# Patient Record
Sex: Male | Born: 1955
Health system: Southern US, Community
[De-identification: ages and names within clinical notes are randomized; demographics above are authoritative.]

## PROBLEM LIST (undated history)

## (undated) DIAGNOSIS — E119 Type 2 diabetes mellitus without complications: Secondary | ICD-10-CM

## (undated) DIAGNOSIS — I10 Essential (primary) hypertension: Secondary | ICD-10-CM

## (undated) DIAGNOSIS — K219 Gastro-esophageal reflux disease without esophagitis: Secondary | ICD-10-CM

## (undated) DIAGNOSIS — M1990 Unspecified osteoarthritis, unspecified site: Secondary | ICD-10-CM

## (undated) DIAGNOSIS — I700 Atherosclerosis of aorta: Secondary | ICD-10-CM

## (undated) DIAGNOSIS — Z7902 Long term (current) use of antithrombotics/antiplatelets: Secondary | ICD-10-CM

## (undated) DIAGNOSIS — H47021 Hemorrhage in optic nerve sheath, right eye: Secondary | ICD-10-CM

## (undated) DIAGNOSIS — J302 Other seasonal allergic rhinitis: Secondary | ICD-10-CM

## (undated) DIAGNOSIS — N529 Male erectile dysfunction, unspecified: Secondary | ICD-10-CM

## (undated) DIAGNOSIS — E785 Hyperlipidemia, unspecified: Secondary | ICD-10-CM

## (undated) DIAGNOSIS — I2510 Atherosclerotic heart disease of native coronary artery without angina pectoris: Secondary | ICD-10-CM

## (undated) HISTORY — DX: Hemorrhage in optic nerve sheath, right eye: H47.021

## (undated) HISTORY — DX: Other seasonal allergic rhinitis: J30.2

## (undated) HISTORY — DX: Hyperlipidemia, unspecified: E78.5

## (undated) HISTORY — PX: VASECTOMY: SHX75

## (undated) HISTORY — DX: Type 2 diabetes mellitus without complications: E11.9

## (undated) HISTORY — DX: Male erectile dysfunction, unspecified: N52.9

## (undated) HISTORY — DX: Atherosclerotic heart disease of native coronary artery without angina pectoris: I25.10

---

## 2004-08-26 ENCOUNTER — Ambulatory Visit: Payer: Self-pay | Admitting: Family Medicine

## 2006-04-01 ENCOUNTER — Ambulatory Visit: Payer: Self-pay

## 2006-06-08 ENCOUNTER — Ambulatory Visit: Payer: Self-pay | Admitting: Orthopaedic Surgery

## 2006-06-09 ENCOUNTER — Ambulatory Visit: Payer: Self-pay | Admitting: Orthopaedic Surgery

## 2007-01-07 HISTORY — PX: ROTATOR CUFF REPAIR: SHX139

## 2007-01-28 ENCOUNTER — Ambulatory Visit: Payer: Self-pay | Admitting: Family Medicine

## 2008-06-14 IMAGING — RF DG ARTHROGRAM INJ W/FG MRI/CT SHOULDER*R*
1 series · 5 of 5 positions shown · non-contrast
Comparison: none

REASON FOR EXAM: right shoulder pain eval for RCT
COMMENTS:

[Series 1: run · 5 of 5 slices shown]
[im 1/5]
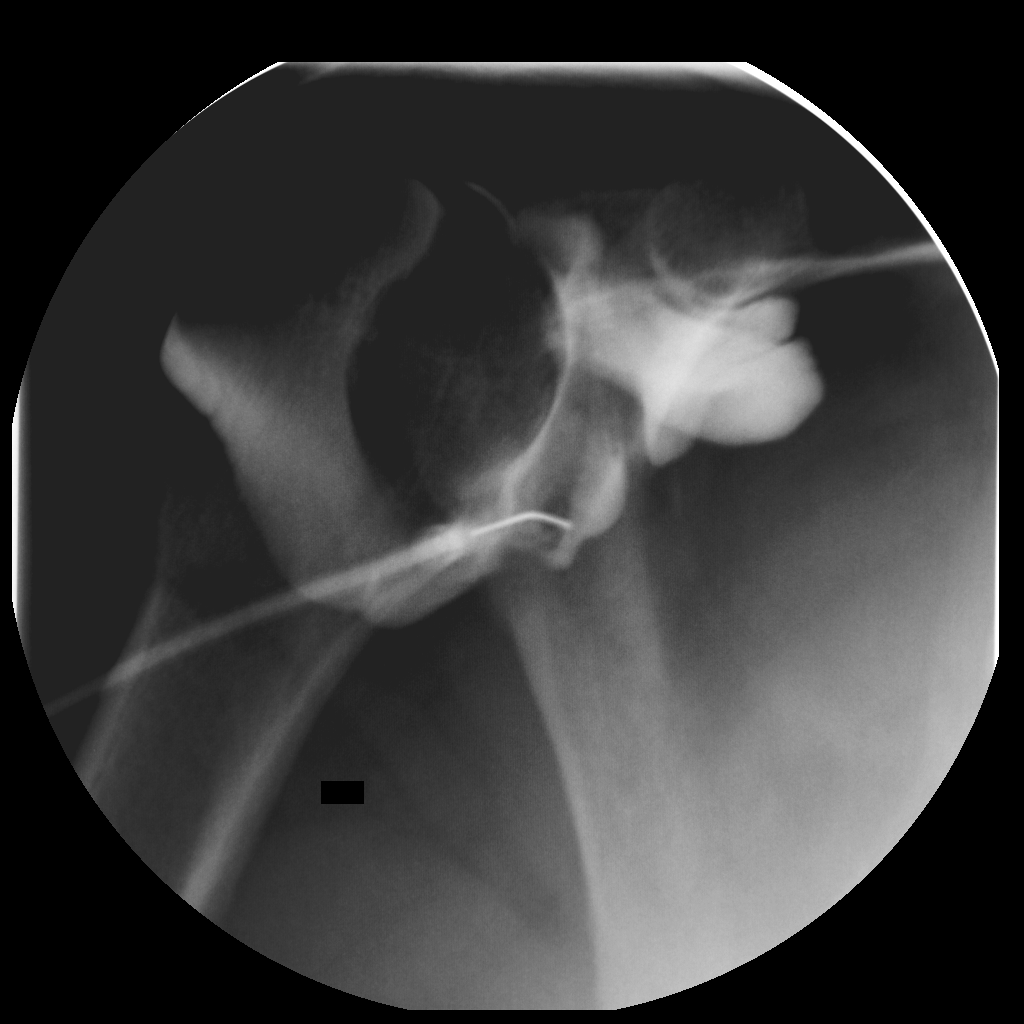
[im 2/5]
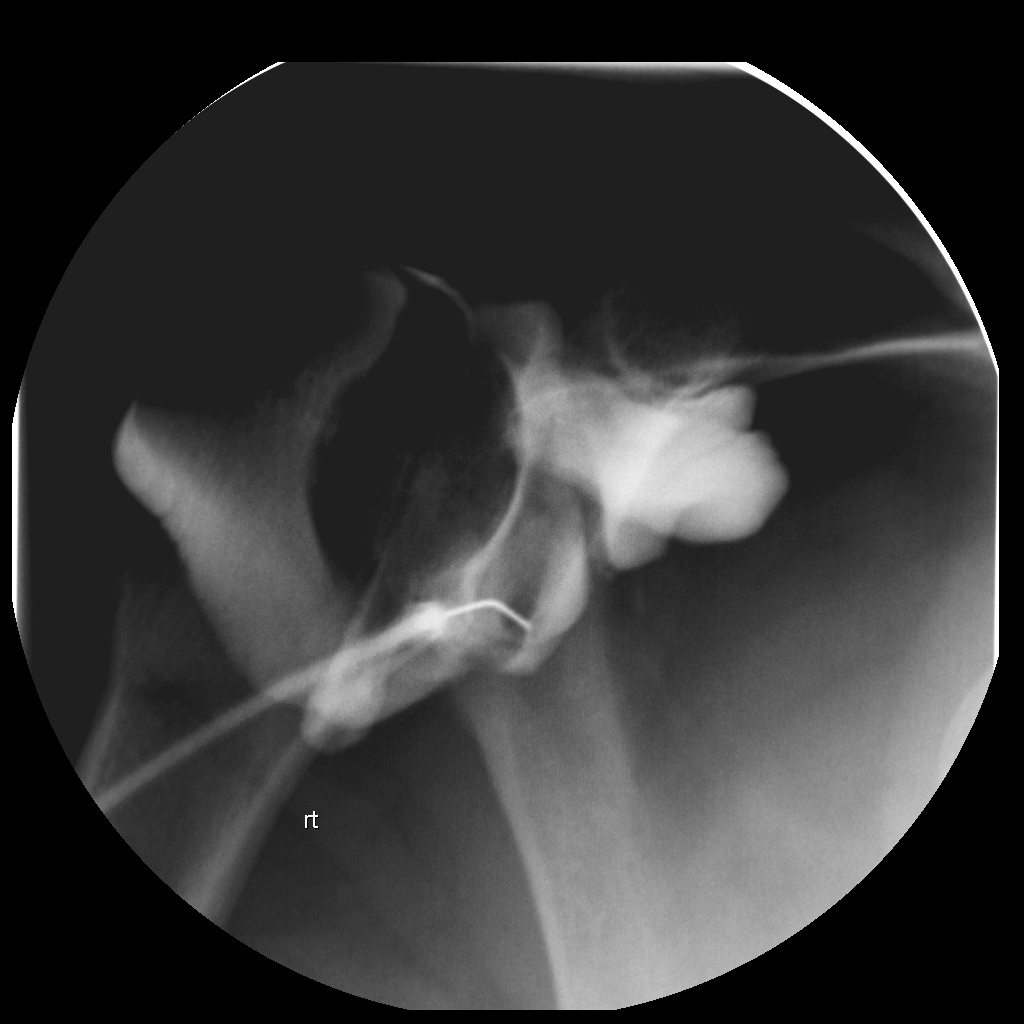
[im 3/5]
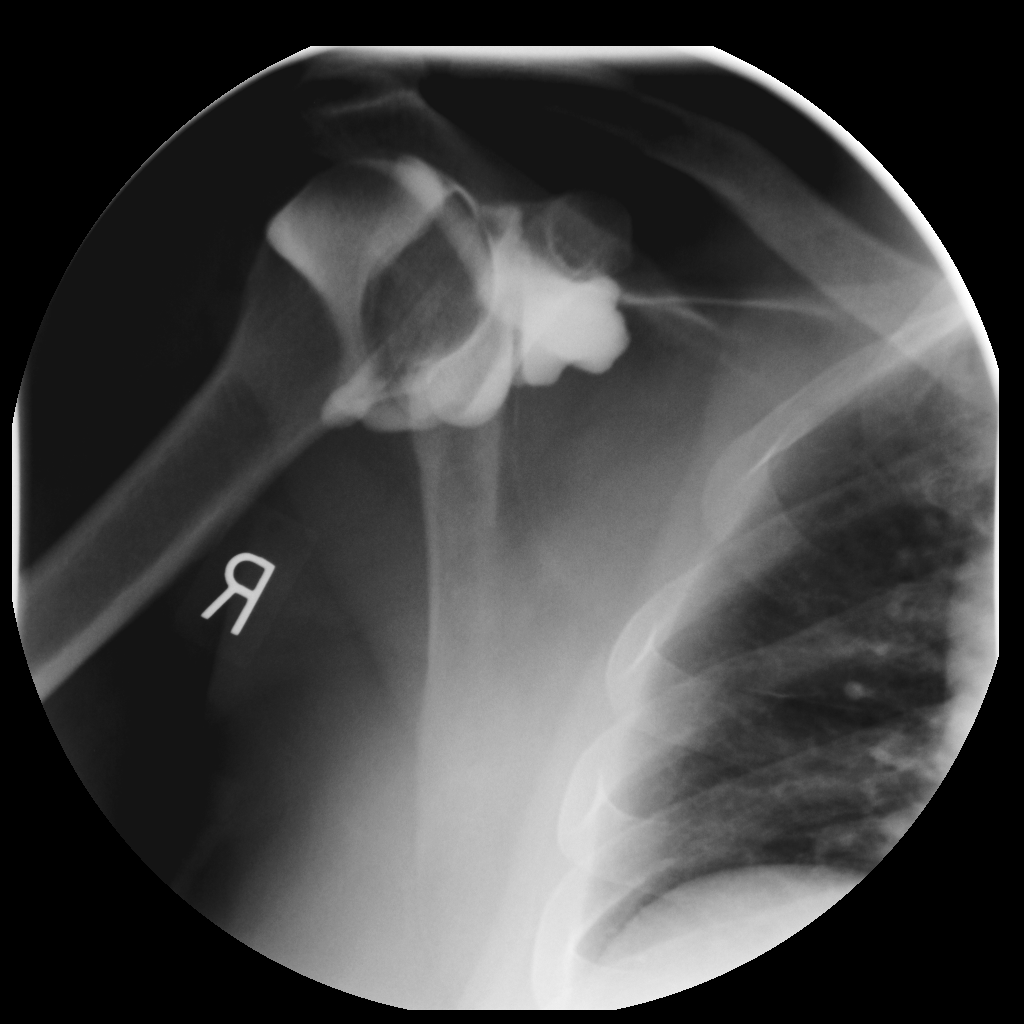
[im 4/5]
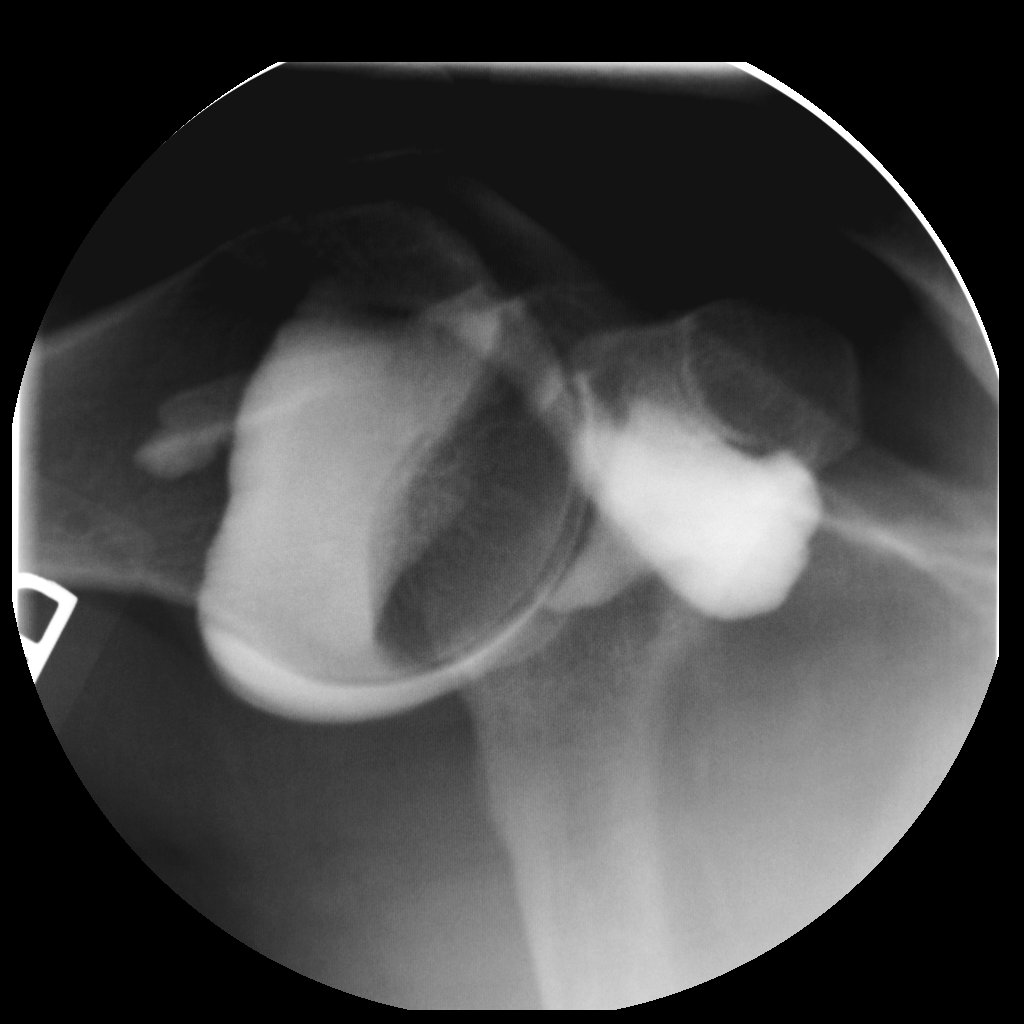
[im 5/5]
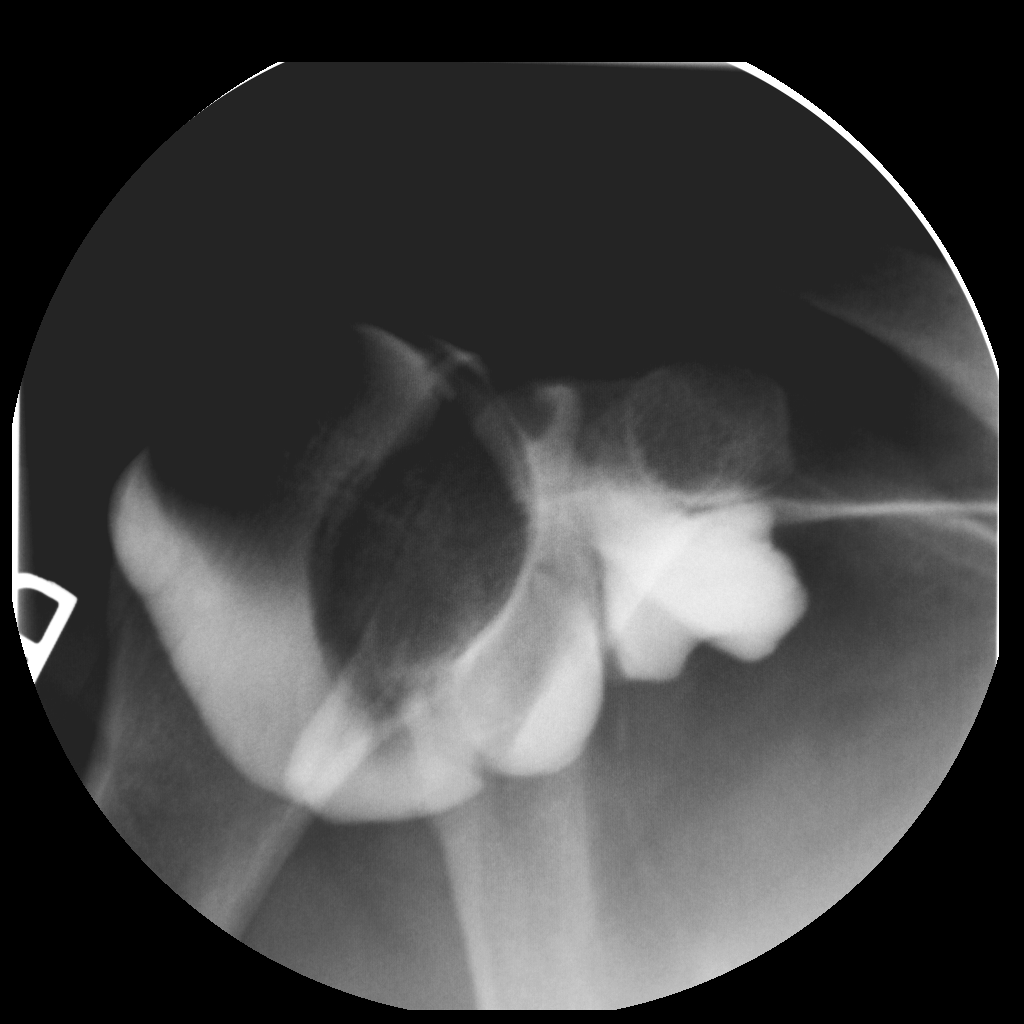

[5 of 5 positions shown; findings below may reference images not displayed]

PROCEDURE:     FL  - FL INJ RT SHOULDER  MR DELU  - March 25, 2006 [DATE]

RESULT:     The patient has history of a RIGHT shoulder injury and is being
evaluated for internal derangement. The anticipated procedure was discussed
with Mr. Morja. He voiced his willingness to proceed. The patient voiced no
allergy to lidocaine or iodine or Magnevist solutions. A time-out was
performed and the RIGHT site and correct procedure were confirmed.

The skin over the anterior aspect of the glenohumeral joint was cleansed
with an iodine solution. The subcutaneous tissues were infiltrated with
approximately 5 ml of 1% lidocaine. Subsequently, a 25- gauge spinal needle
was placed into the inferior aspect of the glenohumeral joint. Positioning
was confirmed and approximately 12 ml of a solution of 15 ml of sterile
saline, 10 ml of [id number], and 0.1 ml of Magnevist were instilled.
Filming was performed and the needle withdrawn. There was no immediate
evidence of a tear of the rotator cuff.
IMPRESSION: 1.The patient underwent successful fluoroscopically assisted injection of
the RIGHT glenohumeral joint. The patient went to the MRI Suite in good
condition. However, the patient had symptoms consistent with claustrophobia
and was unable to complete the MRI procedure.

## 2008-06-14 IMAGING — MR MR ARTHROGRAM SHOULDER*R*
1 series · 16 of 16 positions shown · non-contrast
Comparison: none

REASON FOR EXAM: right shoulder pain eval for RCT
COMMENTS:

PROCEDURE:     MR  - MR SHOULDER ARTHROGRAM R  - March 25, 2006  [DATE]
RESULT:
HISTORY: RIGHT shoulder pain.

[Series 3: T1 fat-sat · axial · 4.0mm · 0.62mm/px · z∈[-9,+112]mm · 16 of 16 slices shown]
[im 1/16]
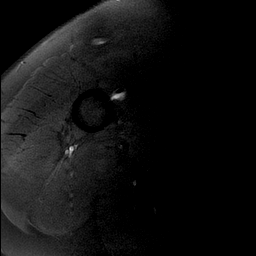
[im 2/16]
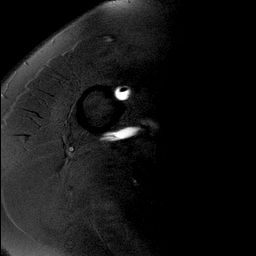
[im 3/16]
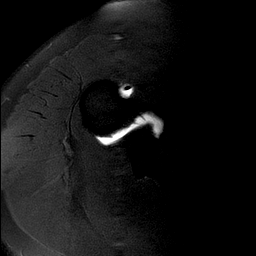
[im 4/16]
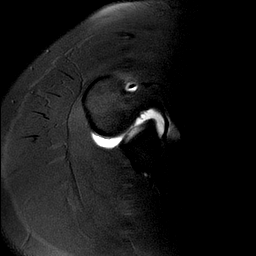
[im 5/16]
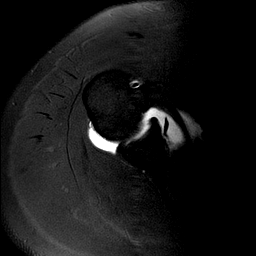
[im 6/16]
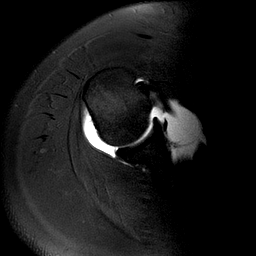
[im 7/16]
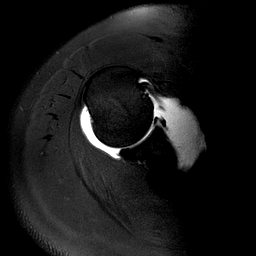
[im 8/16]
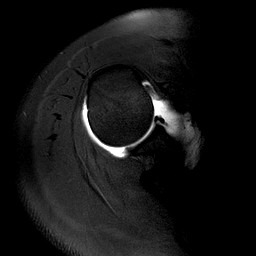
[im 9/16]
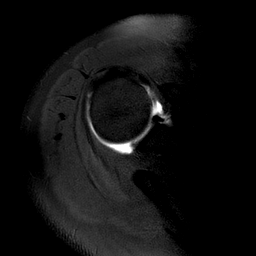
[im 10/16]
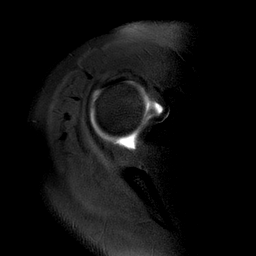
[im 11/16]
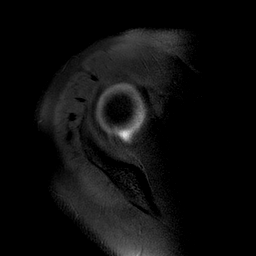
[im 12/16]
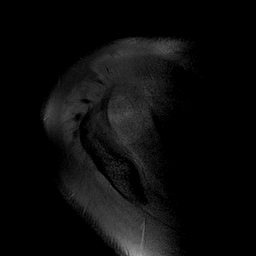
[im 13/16]
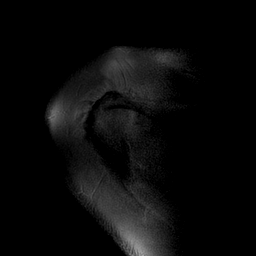
[im 14/16]
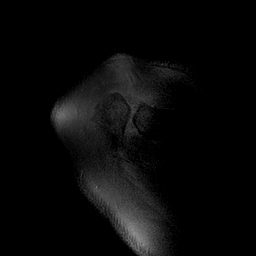
[im 15/16]
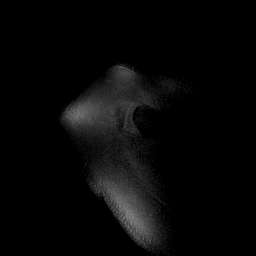
[im 16/16]
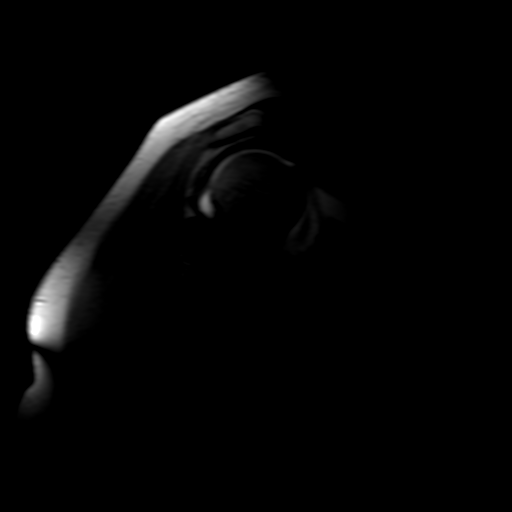

[16 of 16 positions shown; findings below may reference images not displayed]

COMPARISON STUDIES: No recent.

PROCEDURE AND FNIDINGS: MR shoulder arthrogram was performed following
arthrography by Dr. Flemming Hedegaard. This was a very limited exam as the patient
could not tolerate the MRI scan.  The rotator cuff muscles are difficult to
image as only axial views are available; however, no definite rotator cuff
muscle or tendon tear is noted on the axial images available.
Anterior/superior glenoid labral partial tear cannot be entirely excluded.
The long head of the biceps tendon is intact. The humeral head is intact.
IMPRESSION: 1)Limited exam as described above. Only one pulse sequence could be
obtained.  Subtle tear to the anterior superior glenoid labrum could not be
excluded.  The rotator cuff muscles and tendons were not adequately imaged.
No definite tear was identified.

## 2008-06-21 IMAGING — RF DG ARTHROGRAM INJ W/FG MRI/CT SHOULDER*R*
1 series · 2 of 2 positions shown · non-contrast
Comparison: none

REASON FOR EXAM: right shoulder pain eval for RCT
COMMENTS:

[Series 1: run · 2 of 2 slices shown]
[im 1/2]
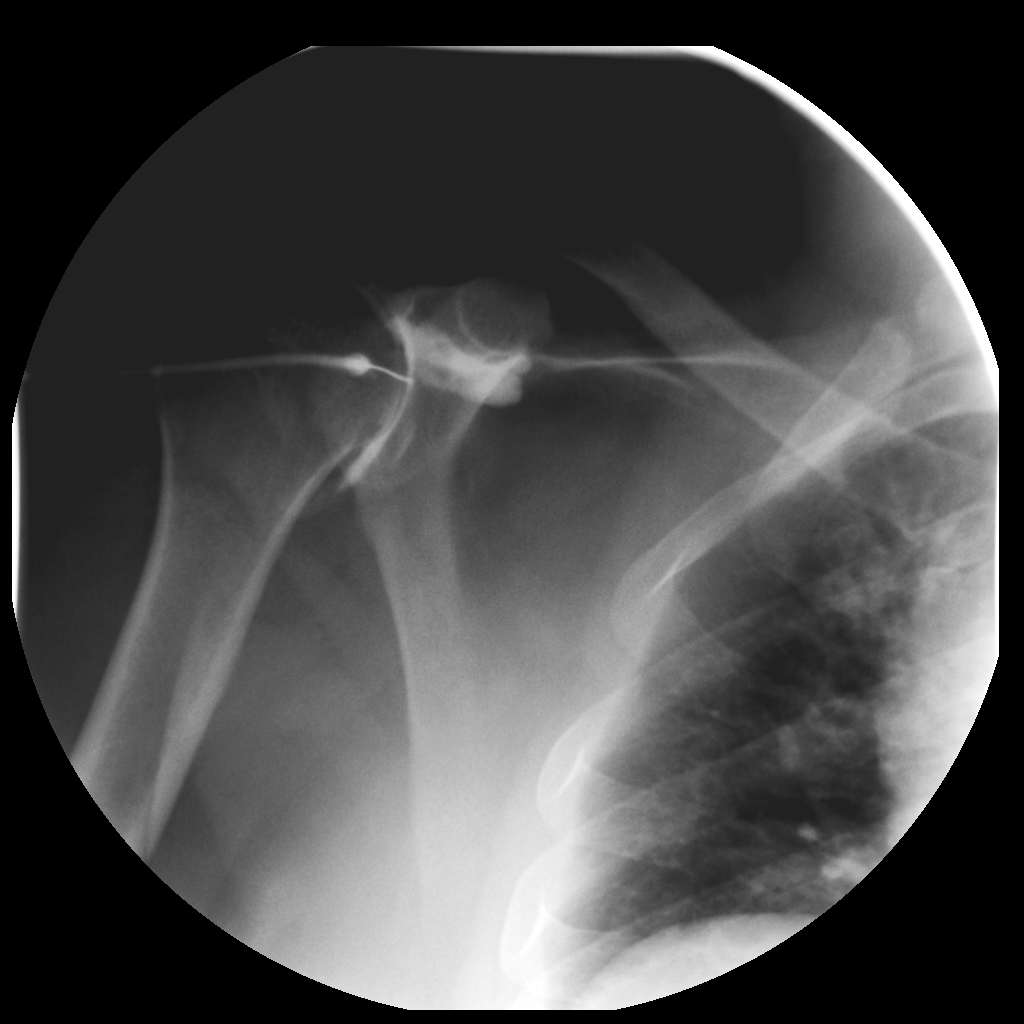
[im 2/2]
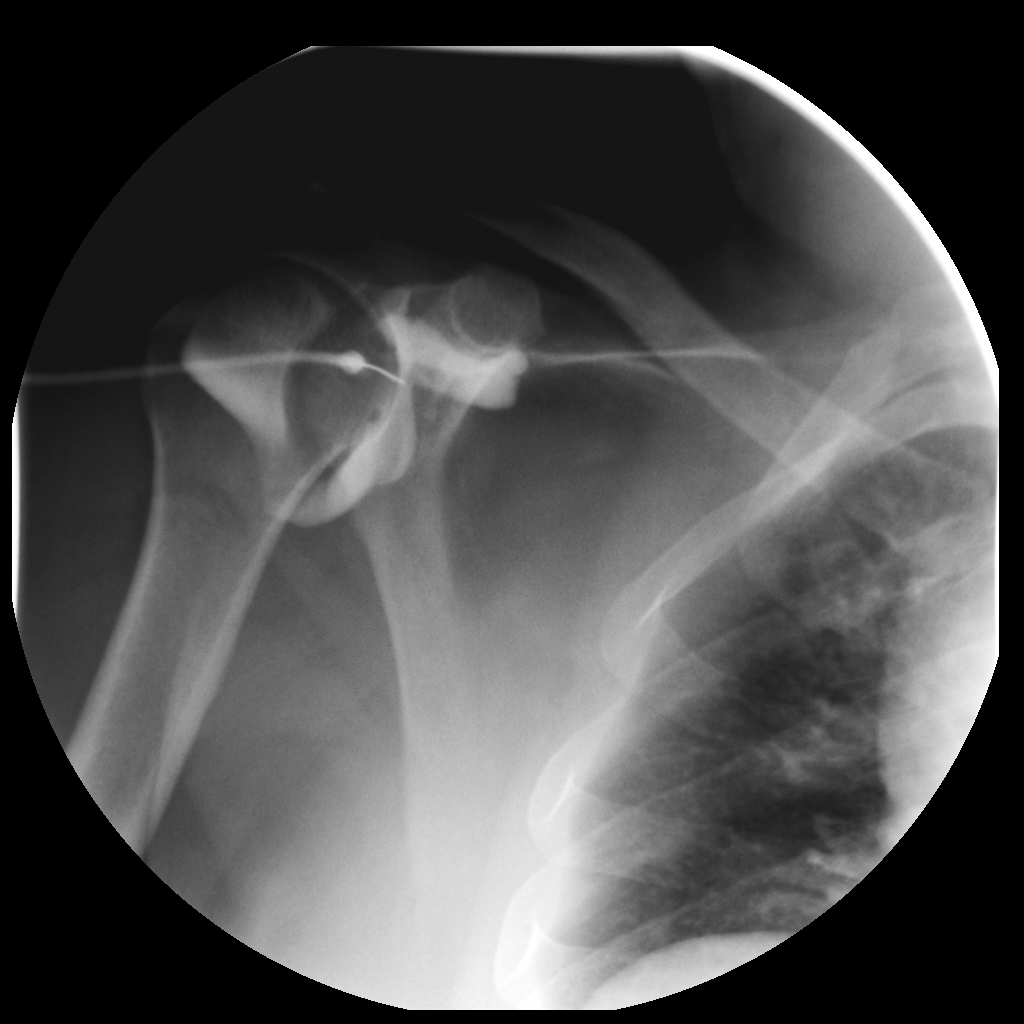

[2 of 2 positions shown; findings below may reference images not displayed]

PROCEDURE:     FL  - FL INJ RT SHOULDER  MR JACK  - April 01, 2006 [DATE]

RESULT:     The patient was informed of the risks and benefits of the
procedure and proper informed consent was obtained. The patient was brought
to the Fluoroscopy Suite and placed in a supine position. The RIGHT shoulder
was evaluated and a proper entry site for cannulation of the glenohumeral
joint was established. The overlying soft tissues were then prepped and
draped in the usual sterile fashion. Utilizing approximately 4 ml of 1%
lidocaine without epinephrine the overlying soft tissues were anesthetized.
The glenohumeral joint was then cannulated with a 21-gauge 3.5 inch spinal
needle.  The glenohumeral joint was injected with a mixture of Magnevist,
Optiray 320 and sterile saline.  Approximately 18 ml of this mixture was
injected into the joint.  The initial mixture consisted of .1 ml of
Magnevist, 10 ml of Optiray 320, and 15 ml of sterile saline.  Appropriate
injection site was established with confirmatory fluoroscopic imaging.
After the 18 ml was injected into the joint the needle was removed and the
patient tolerated the procedure without complications and then was taken to
the MRI Suite for further evaluation.
IMPRESSION: 1)Fluoroscopically guided RIGHT shoulder injection as described above.  The
patient tolerated the procedure without complications.

## 2008-06-21 IMAGING — MR MR ARTHROGRAM SHOULDER*R*
5 of 6 series · 26 of 40 positions shown · non-contrast
Comparison: none

REASON FOR EXAM: right shoulder pain eval for RCT
COMMENTS:

[Series 3: T1 fat-sat · axial · 4.0mm · 0.31mm/px · z∈[-29,+200]mm · 4 of 16 slices shown]
[im 1/16]
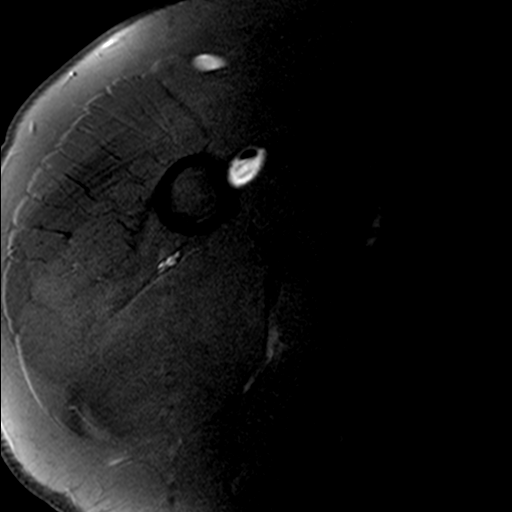
[im 6/16]
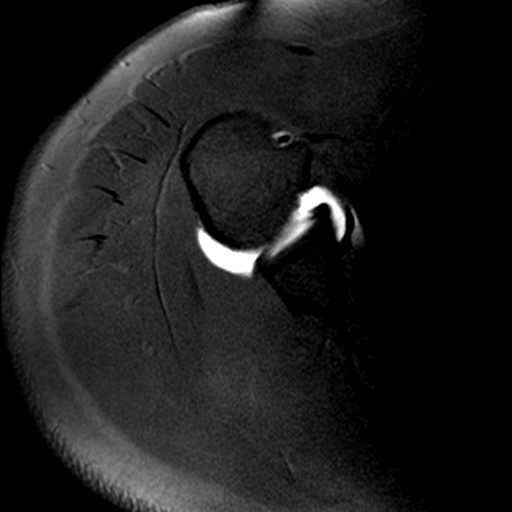
[im 11/16]
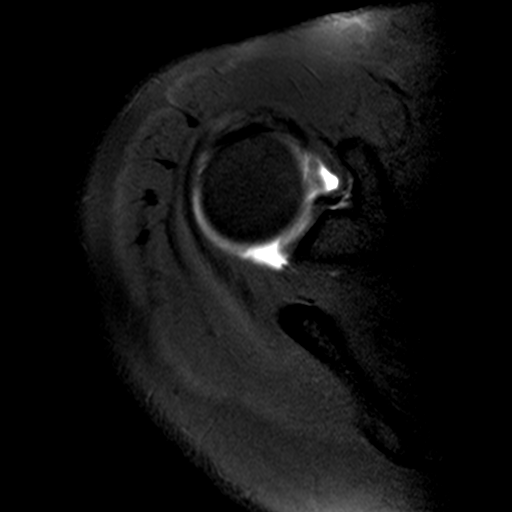
[im 16/16]
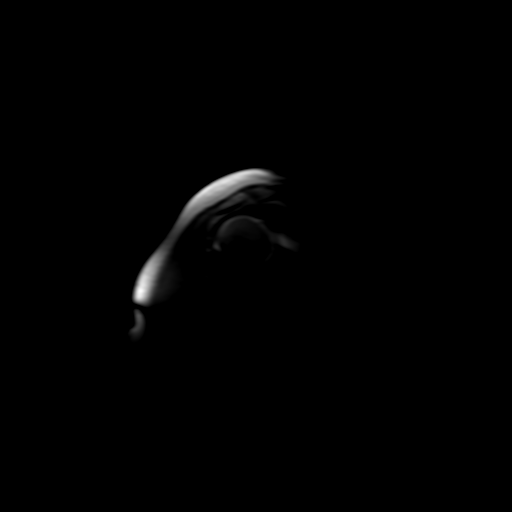

[Series 5003: T2 · axial · 4.0mm · 0.31mm/px · z∈[-29,+44]mm · 4 of 15 slices shown (1 of 2)]
[im 1/15]
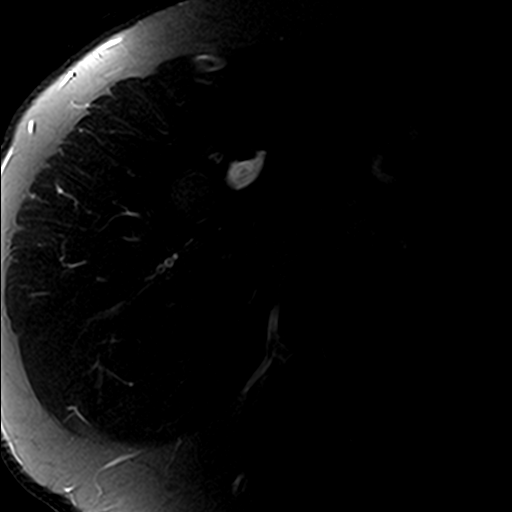
[im 5/15]
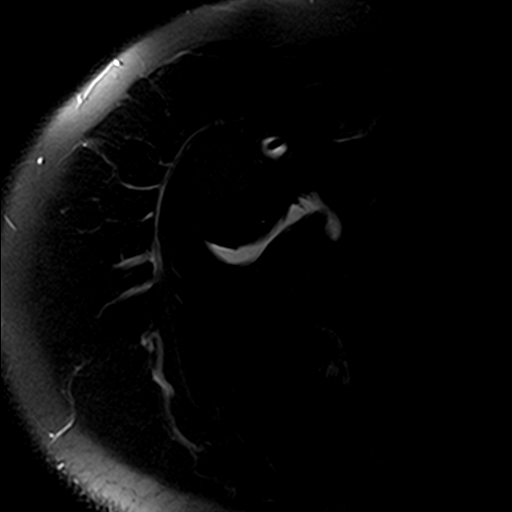
[im 10/15]
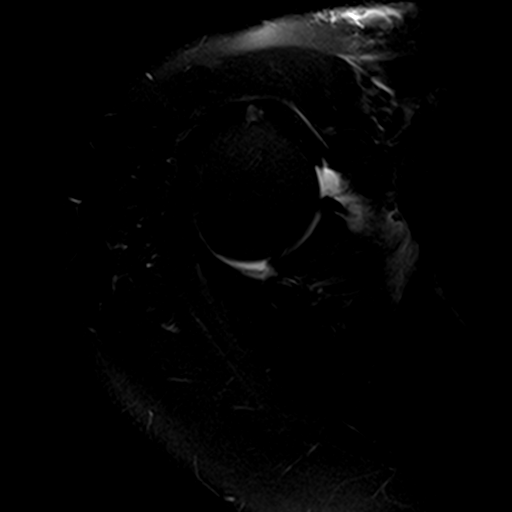
[im 15/15]
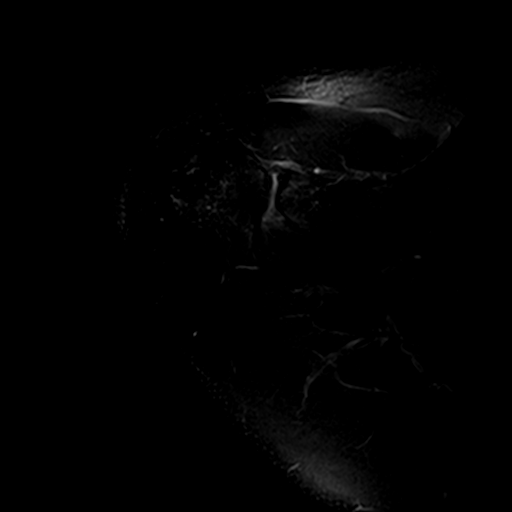

[Series 5005: T1 · oblique · 1.6mm · 0.56mm/px · 11 of 61 slices shown (1 of 2)]
[im 4/61]
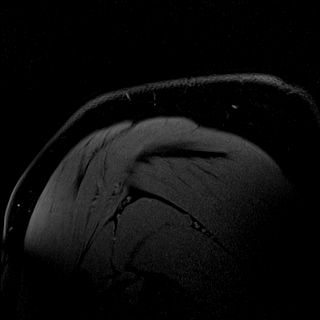
[im 8/61]
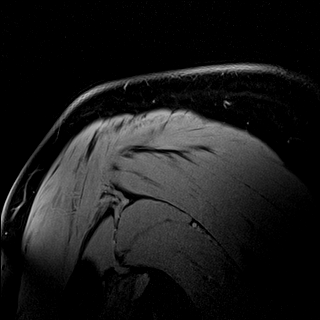
[im 12/61]
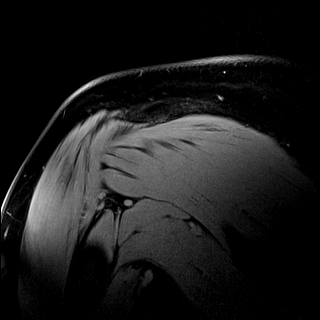
[im 19/61]
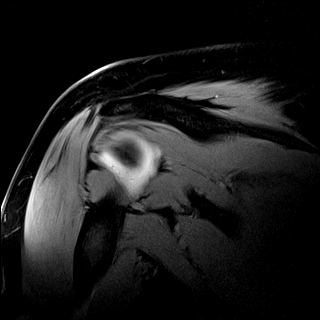
[im 27/61]
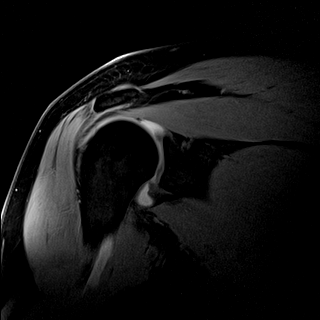
[im 31/61]
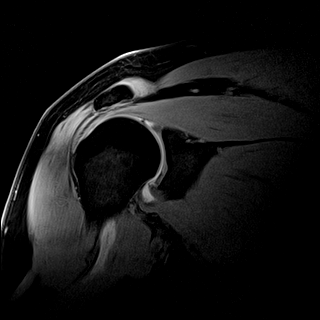
[im 34/61]
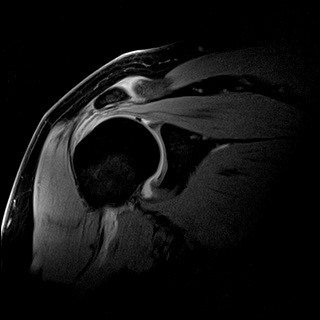
[im 42/61]
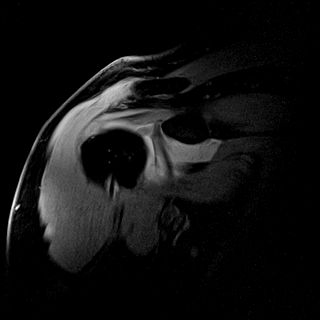
[im 49/61]
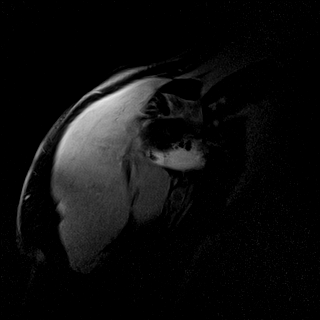
[im 53/61]
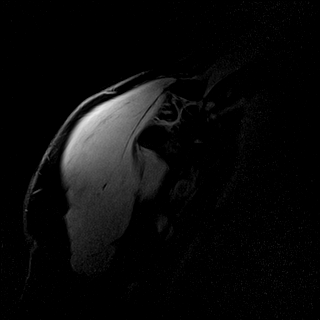
[im 57/61]
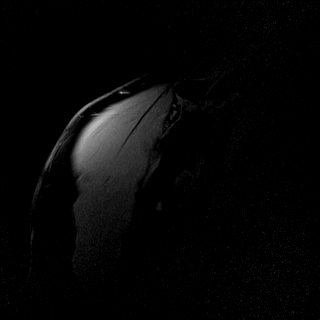

[Series 5008: T2 · oblique · 4.0mm · 0.31mm/px · 2 of 17 slices shown (2 of 2)]
[im 1/17]
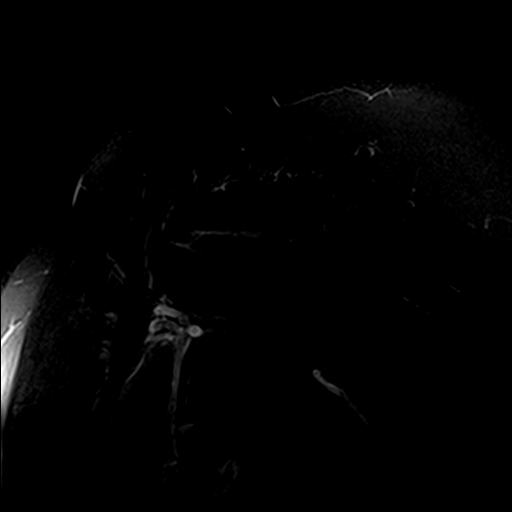
[im 5/17]
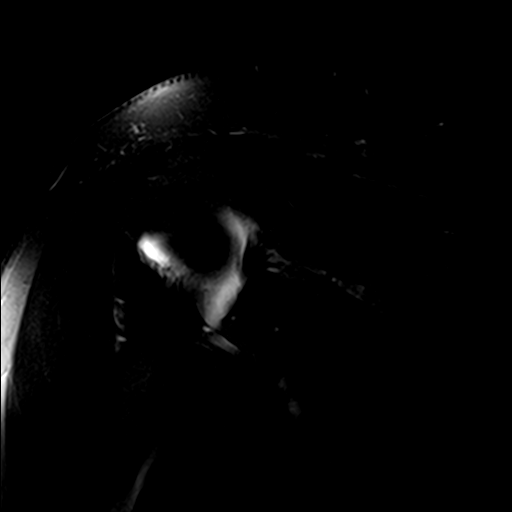

[Series 5009: T1 · axial · 4.0mm · 0.31mm/px · z∈[+8,+88]mm · 5 of 18 slices shown (2 of 2)]
[im 1/18]
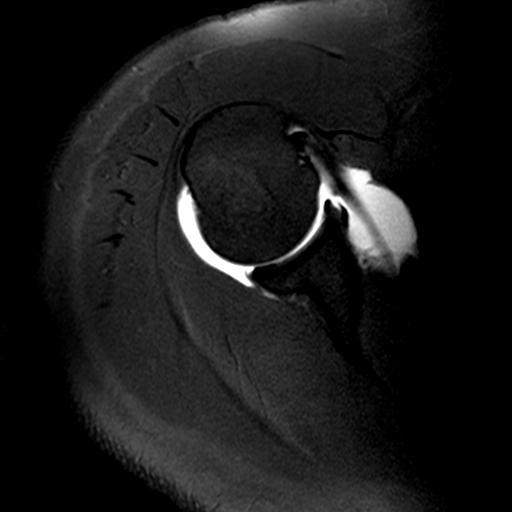
[im 5/18]
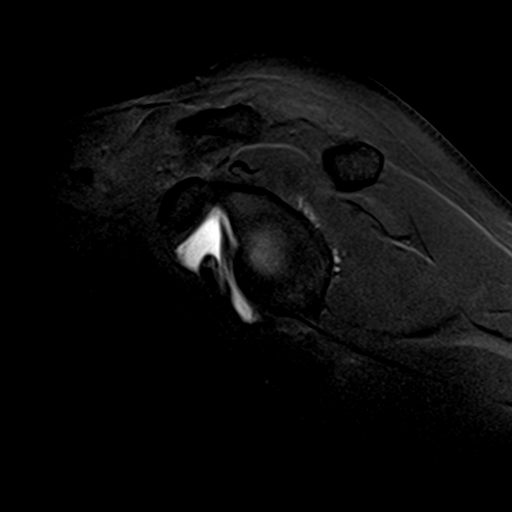
[im 9/18]
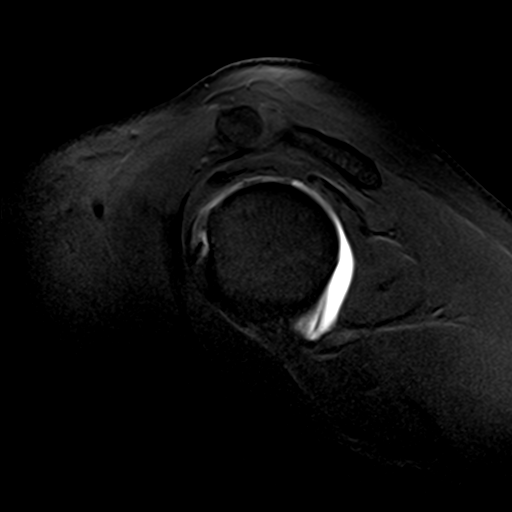
[im 13/18]
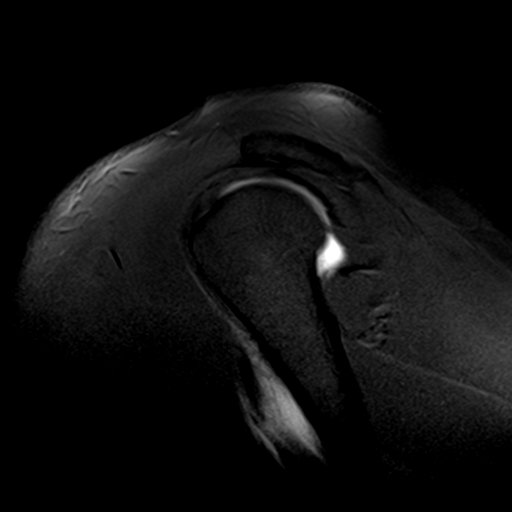
[im 18/18]
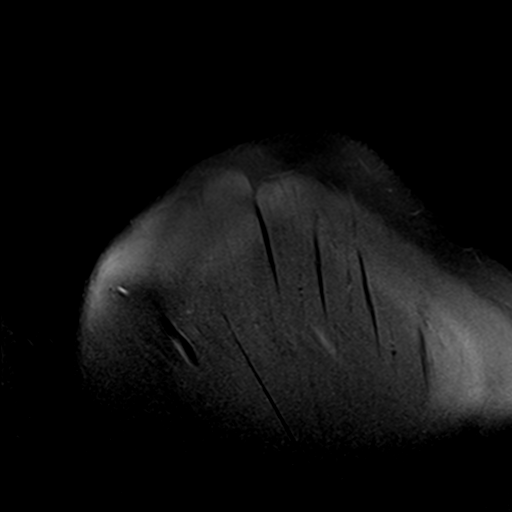

[26 of 40 positions shown; findings below may reference images not displayed]

PROCEDURE:     MR  - MR SHOULDER ARTHROGRAM R  - April 01, 2006 [DATE]

RESULT:       No evidence of complete rotator cuff tear. Mild
acromioclavicular degenerative change and downward sloping acromion and mild
subacromial spurring noted.  Mild supraspinatus tendinous impingement is
noted. The glenoid labrum is intact. The long head of the biceps tendon is
intact and in place. The humeral head is normal.
IMPRESSION: 1.     Supraspinatus tendinous impingement from downward sloping acromion,
acromioclavicular degenerative change and mild subacromial spurring.  The
supraspinatus tendinous impingement is mild.
2.     No evidence of rotator cuff tear.

## 2009-04-19 IMAGING — CT CT ABD-PELV W/ CM
1 of 2 series · 15 of 32 positions shown, 20 images · non-contrast
Comparison: none

REASON FOR EXAM: Abdominal Pain
COMMENTS:

[Series 2: soft tissue · axial · 0.85mm/px · z∈[+26,+482]mm · 15 of 63 slices shown, 20 images]
[im 3/63  soft-tissue]
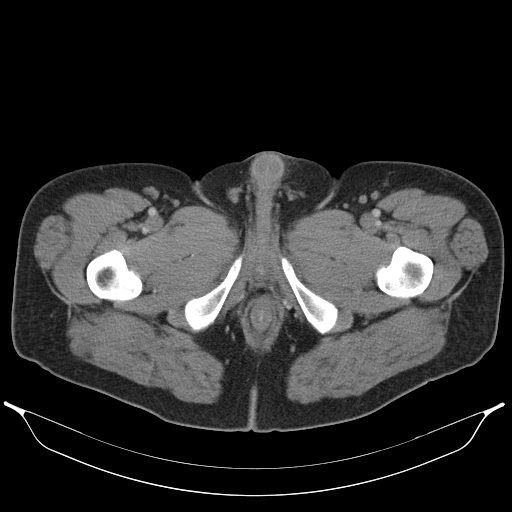
[im 3/63  bone]
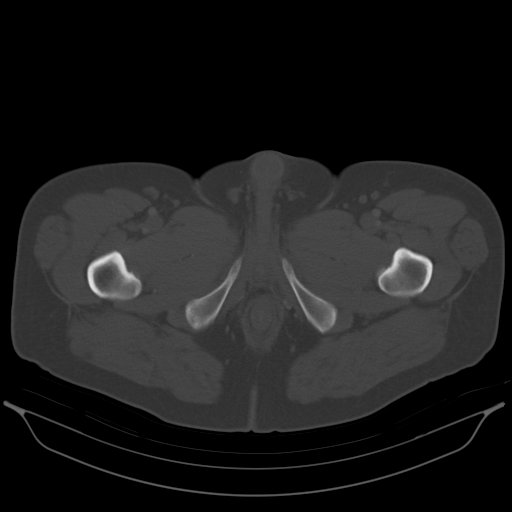
[im 9/63  soft-tissue]
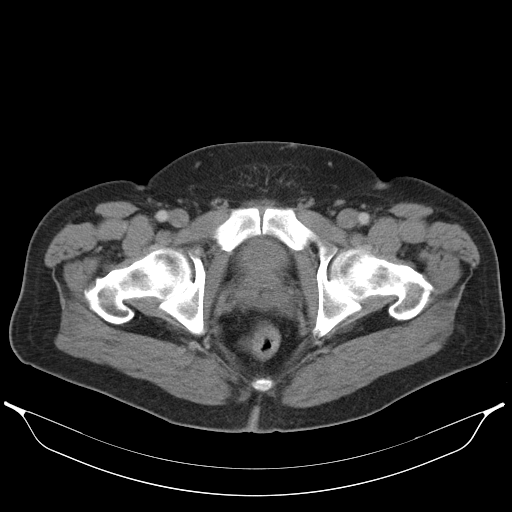
[im 12/63  soft-tissue]
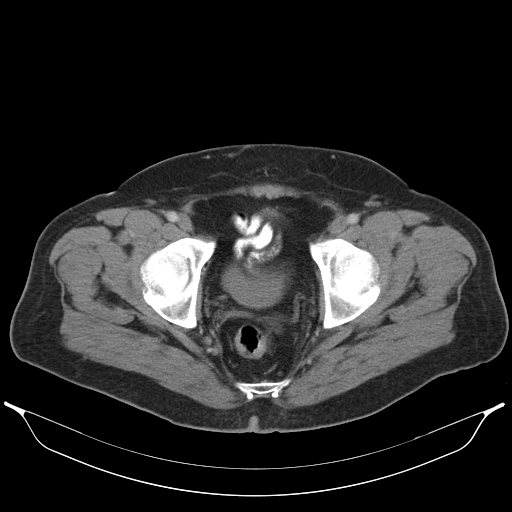
[im 18/63  soft-tissue]
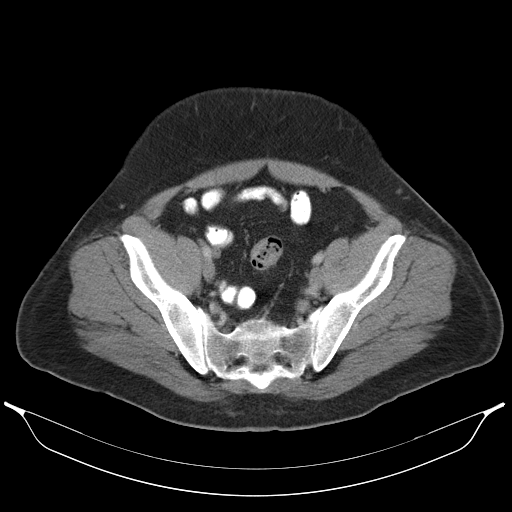
[im 21/63  soft-tissue]
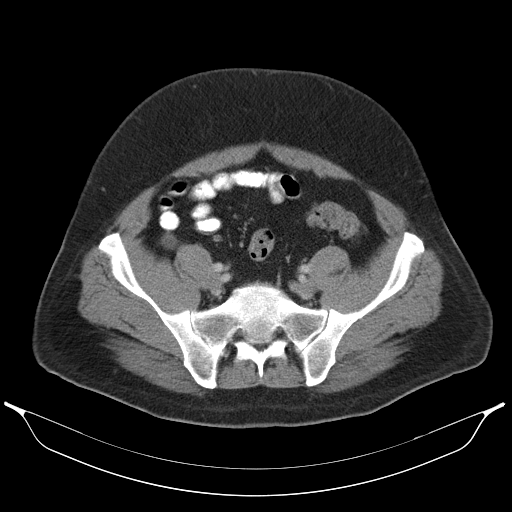
[im 24/63  soft-tissue]
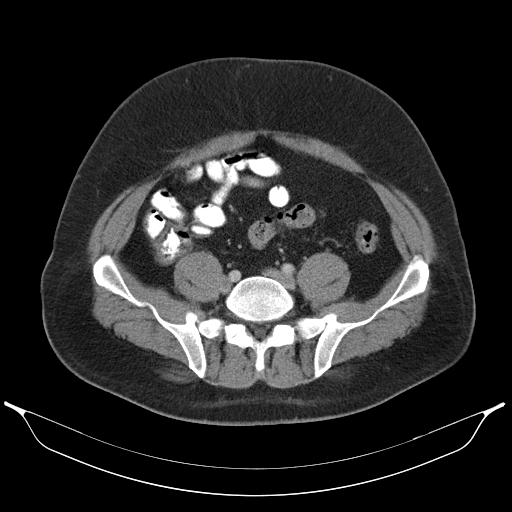
[im 30/63  soft-tissue]
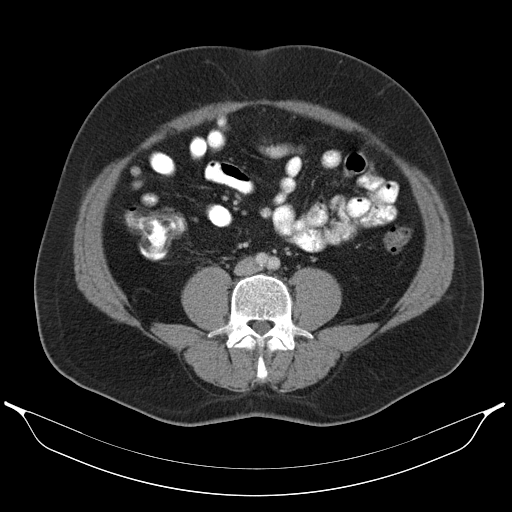
[im 33/63  soft-tissue]
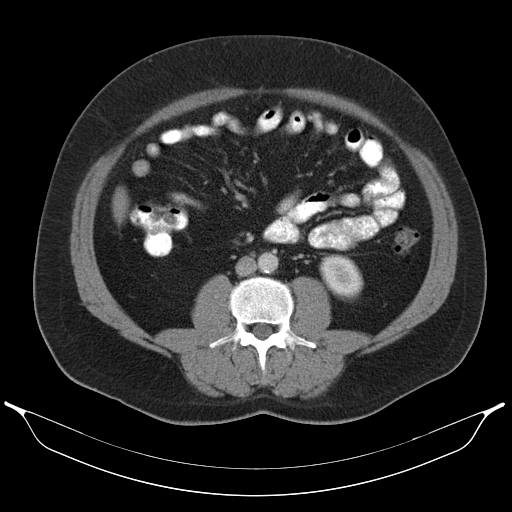
[im 39/63  soft-tissue]
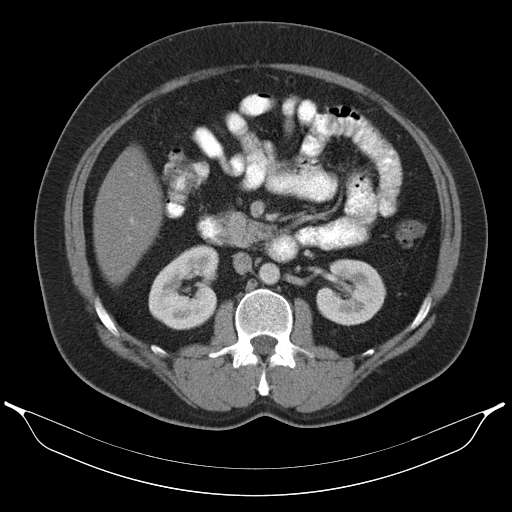
[im 39/63  bone]
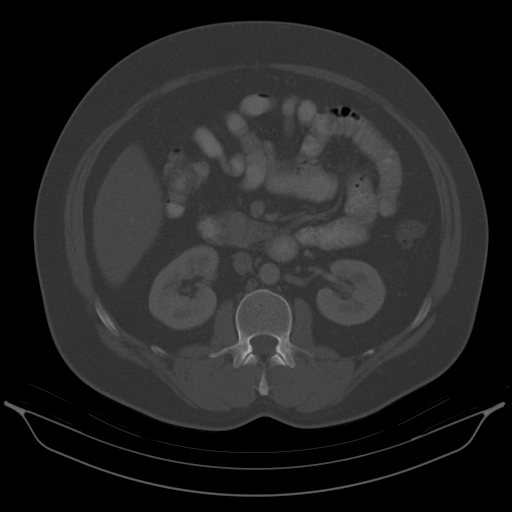
[im 42/63  soft-tissue]
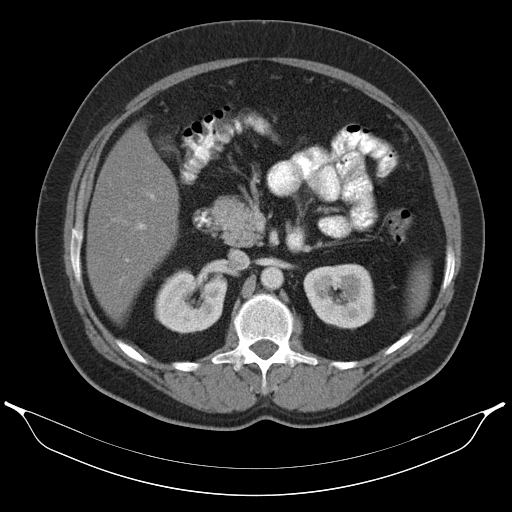
[im 48/63  soft-tissue]
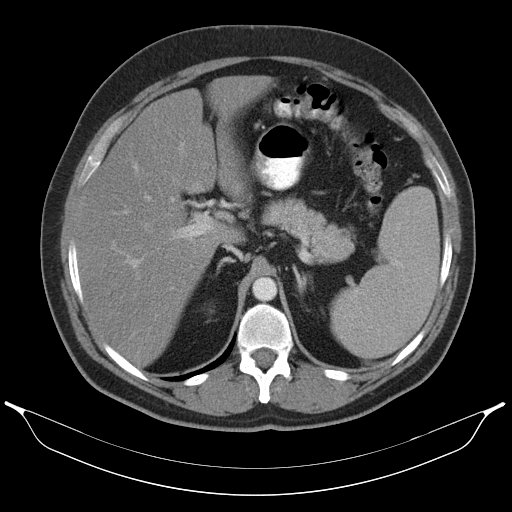
[im 51/63  soft-tissue]
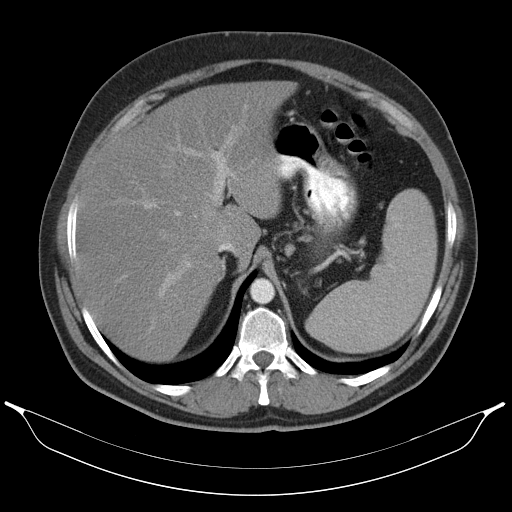
[im 51/63  lung]
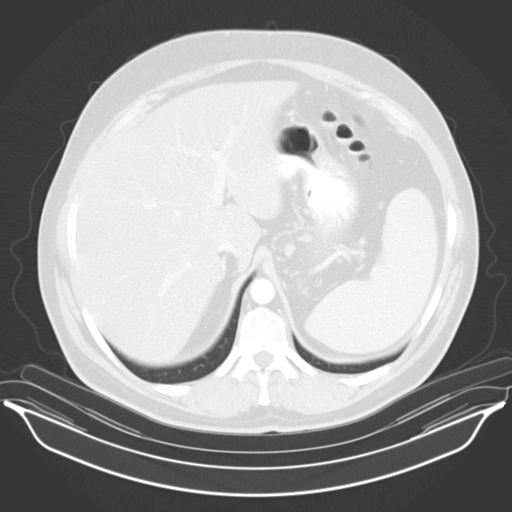
[im 54/63  soft-tissue]
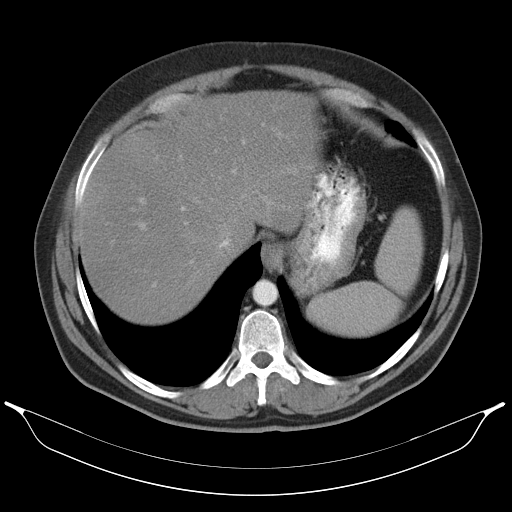
[im 54/63  lung]
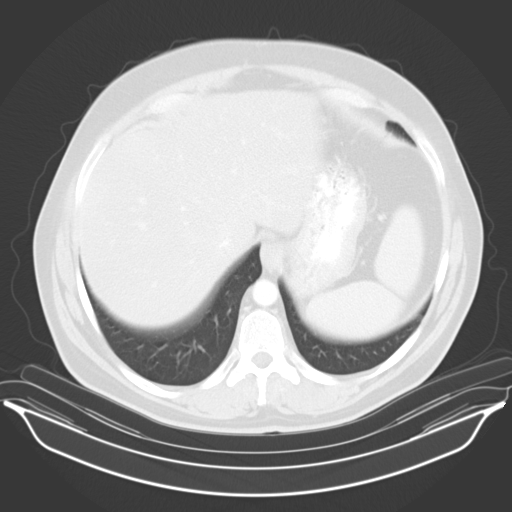
[im 57/63  lung]
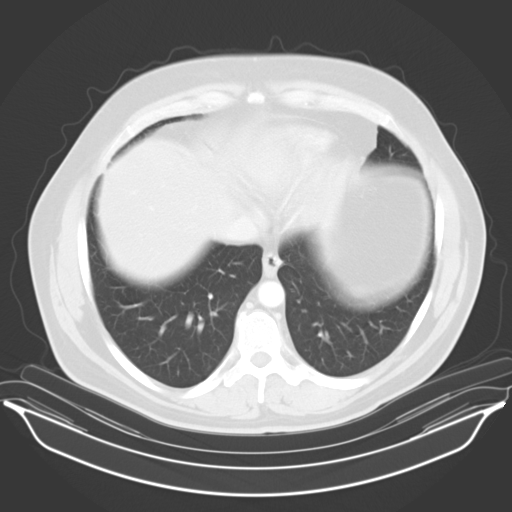
[im 60/63  soft-tissue]
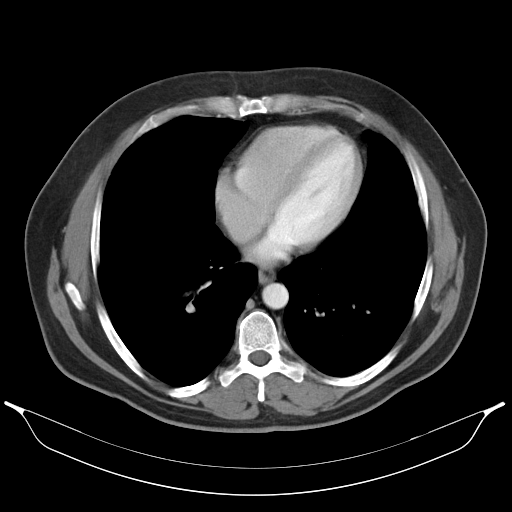
[im 60/63  lung]
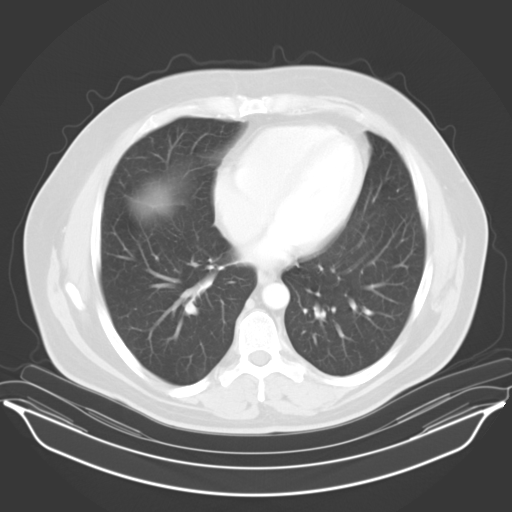

[15 of 32 positions shown; findings below may reference images not displayed]

PROCEDURE:     NERLANDE - NERLANDE ABDOMEN / PELVIS W  - January 28, 2007  [DATE]

RESULT:     Helical 8 mm sections were obtained from the lung bases through
the pubic symphysis.

Evaluation of the lung bases demonstrates no gross abnormalities. The liver,
spleen, adrenals, pancreas and kidneys are unremarkable. There is no CT
evidence of diverticulitis, colitis, enteritis, bowel obstruction nor CT
signs reflecting appendicitis. Note; there is a mild diffuse low attenuating
architecture of the liver. The portal vein, SMA, celiac and IMA are
opacified. There is no CT evidence of an abdominal aortic aneurysm.
IMPRESSION: 1.No CT evidence of focal or acute abnormalities.

2.There are findings which appear to reflect an element of mild to moderate
hepatic steatosis.

## 2012-05-03 ENCOUNTER — Ambulatory Visit: Payer: Self-pay | Admitting: Internal Medicine

## 2012-05-18 ENCOUNTER — Ambulatory Visit: Payer: Self-pay | Admitting: Internal Medicine

## 2012-05-28 ENCOUNTER — Encounter: Payer: Self-pay | Admitting: Internal Medicine

## 2012-07-22 ENCOUNTER — Ambulatory Visit (AMBULATORY_SURGERY_CENTER): Payer: 59 | Admitting: *Deleted

## 2012-07-22 VITALS — Ht 61.0 in | Wt 206.0 lb

## 2012-07-22 DIAGNOSIS — Z1211 Encounter for screening for malignant neoplasm of colon: Secondary | ICD-10-CM

## 2012-07-22 MED ORDER — MOVIPREP 100 G PO SOLR: 1.0000 | Freq: Once | ORAL | Status: DC

## 2012-07-22 NOTE — Progress Notes (Signed)
Denies complications with anesthesia or sedation. Denies allergies to eggs or soy products. 

## 2012-08-05 ENCOUNTER — Encounter: Payer: Self-pay | Admitting: Internal Medicine

## 2012-08-05 ENCOUNTER — Ambulatory Visit (AMBULATORY_SURGERY_CENTER): Payer: 59 | Admitting: Internal Medicine

## 2012-08-05 VITALS — BP 124/61 | HR 52 | Temp 98.1°F | Resp 14 | Ht 71.0 in | Wt 206.0 lb

## 2012-08-05 DIAGNOSIS — D126 Benign neoplasm of colon, unspecified: Secondary | ICD-10-CM

## 2012-08-05 DIAGNOSIS — Z1211 Encounter for screening for malignant neoplasm of colon: Secondary | ICD-10-CM

## 2012-08-05 MED ORDER — SODIUM CHLORIDE 0.9 % IV SOLN: 500.0000 mL | INTRAVENOUS | Status: DC

## 2012-08-05 NOTE — Progress Notes (Signed)
Patient did not experience any of the following events: a burn prior to discharge; a fall within the facility; wrong site/side/patient/procedure/implant event; or a hospital transfer or hospital admission upon discharge from the facility. (G8907) Patient did not have preoperative order for IV antibiotic SSI prophylaxis. (G8918)  

## 2012-08-05 NOTE — Patient Instructions (Addendum)

## 2012-08-05 NOTE — Progress Notes (Signed)
Called to room to assist during endoscopic procedure.  Patient ID and intended procedure confirmed with present staff. Received instructions for my participation in the procedure from the performing physician.  

## 2012-08-05 NOTE — Op Note (Signed)
[facility] [address] [place], [address]   COLONOSCOPY PROCEDURE REPORT  PATIENT: Curtis Singh, Curtis Singh  MR#: [id number] BIRTHDATE: 30-Oct-1955 , 56  yrs. old GENDER: Male ENDOSCOPIST: Beverley Fiedler, MD PROCEDURE DATE:  08/05/2012 PROCEDURE:   Colonoscopy with snare polypectomy First Screening Colonoscopy - Avg.  risk and is 50 yrs.  old or older Yes.  Prior Negative Screening - Now for repeat screening. N/A  History of Adenoma - Now for follow-up colonoscopy & has been > or = to 3 yrs.  N/A  Polyps Removed Today? Yes. ASA CLASS:   Class II INDICATIONS:average risk screening and first colonoscopy. MEDICATIONS: MAC sedation, administered by CRNA and propofol (Diprivan) 450mg  IV  DESCRIPTION OF PROCEDURE:   After the risks benefits and alternatives of the procedure were thoroughly explained, informed consent was obtained.  A digital rectal exam revealed no rectal mass.   The LB [id number]  endoscope was introduced through the anus and advanced to the terminal ileum which was intubated for a short distance. No adverse events experienced.   The quality of the prep was good, using MoviPrep  The instrument was then slowly withdrawn as the colon was fully examined.  COLON FINDINGS: The mucosa appeared normal in the terminal ileum. Four sessile polyps measuring 2-6 mm in size were found in the transverse colon and sigmoid colon.  Polypectomy was performed using cold snare.  All resections were complete and all polyp tissue was completely retrieved.   Mild diverticulosis was noted at the cecum, in the ascending colon, and sigmoid colon.  Retroflexed views revealed no abnormalities. The time to cecum=1 minutes 37 seconds.  Withdrawal time=13 minutes 50 seconds.  The scope was withdrawn and the procedure completed. COMPLICATIONS: There were no complications.  ENDOSCOPIC IMPRESSION: 1.   Normal mucosa in the terminal ileum 2.   Four sessile polyps measuring 2-6 mm in  size were found in the transverse colon and sigmoid colon; Polypectomy was performed using cold snare 3.   Mild diverticulosis was noted at the cecum, in the ascending colon, and sigmoid colon  RECOMMENDATIONS: 1.  Await pathology results 2.  Hold aspirin, aspirin products, and anti-inflammatory medication for 1 week. 3.  High fiber diet 4.  If the polyps removed today are proven to be adenomatous (pre-cancerous) polyps, you will need a colonoscopy in 3 years. Otherwise you should continue to follow colorectal cancer screening guidelines for "routine risk" patients with a colonoscopy in 10 years.  You will receive a letter within 1-2 weeks with the results of your biopsy as well as final recommendations.  Please call my office if you have not received a letter after 3 weeks.   eSigned:  Beverley Fiedler, MD 08/05/2012 11:51 AM             cc: The Patient; PCP

## 2012-08-06 ENCOUNTER — Telehealth: Payer: Self-pay | Admitting: *Deleted

## 2012-08-06 NOTE — Telephone Encounter (Signed)
  Follow up Call-  Call back number 08/05/2012  Post procedure Call Back phone  # [phone]  Permission to leave phone message Yes     Patient questions:  Do you have a fever, pain , or abdominal swelling? no Pain Score  0 *  Have you tolerated food without any problems? yes  Have you been able to return to your normal activities? yes  Do you have any questions about your discharge instructions: Diet   no Medications  no Follow up visit  no  Do you have questions or concerns about your Care? no  Actions: * If pain score is 4 or above: No action needed, pain <4.

## 2012-08-10 ENCOUNTER — Encounter: Payer: Self-pay | Admitting: Internal Medicine

## 2012-11-29 ENCOUNTER — Emergency Department: Payer: Self-pay | Admitting: Emergency Medicine

## 2013-03-16 ENCOUNTER — Ambulatory Visit: Payer: Self-pay | Admitting: Specialist

## 2013-03-29 ENCOUNTER — Ambulatory Visit: Payer: Self-pay | Admitting: Specialist

## 2013-12-15 ENCOUNTER — Ambulatory Visit: Payer: Self-pay | Admitting: Internal Medicine

## 2014-01-06 ENCOUNTER — Ambulatory Visit: Payer: Self-pay | Admitting: Internal Medicine

## 2014-01-20 ENCOUNTER — Ambulatory Visit: Payer: Self-pay

## 2014-01-20 LAB — HEPATIC FUNCTION PANEL A (ARMC)
Albumin: 3.8 g/dL (ref 3.4–5.0)
Alkaline Phosphatase: 81 U/L
Bilirubin, Direct: 0.1 mg/dL (ref 0.0–0.2)
Bilirubin,Total: 0.3 mg/dL (ref 0.2–1.0)
SGOT(AST): 13 U/L — ABNORMAL LOW (ref 15–37)
SGPT (ALT): 32 U/L
Total Protein: 6.9 g/dL (ref 6.4–8.2)

## 2014-01-20 LAB — LIPID PANEL
Cholesterol: 239 mg/dL — ABNORMAL HIGH (ref 0–200)
HDL Cholesterol: 48 mg/dL (ref 40–60)
Ldl Cholesterol, Calc: 157 mg/dL — ABNORMAL HIGH (ref 0–100)
Triglycerides: 171 mg/dL (ref 0–200)
VLDL Cholesterol, Calc: 34 mg/dL (ref 5–40)

## 2014-01-20 LAB — HEMOGLOBIN A1C: Hemoglobin A1C: 10.2 % — ABNORMAL HIGH (ref 4.2–6.3)

## 2014-02-06 ENCOUNTER — Ambulatory Visit: Payer: Self-pay | Admitting: Internal Medicine

## 2014-05-17 ENCOUNTER — Ambulatory Visit: Payer: Self-pay

## 2014-06-28 ENCOUNTER — Ambulatory Visit
Admission: EM | Admit: 2014-06-28 | Discharge: 2014-06-28 | Disposition: A | Discharge status: Home / Self Care | Payer: 59 | Attending: Family Medicine | Admitting: Family Medicine

## 2014-06-28 ENCOUNTER — Encounter: Payer: Self-pay | Admitting: Emergency Medicine

## 2014-06-28 DIAGNOSIS — R509 Fever, unspecified: Secondary | ICD-10-CM | POA: Diagnosis present

## 2014-06-28 DIAGNOSIS — B349 Viral infection, unspecified: Secondary | ICD-10-CM | POA: Diagnosis not present

## 2014-06-28 LAB — CBC WITH DIFFERENTIAL/PLATELET
Basophils Absolute: 0.1 10*3/uL (ref 0–0.1)
Basophils Relative: 1 %
Eosinophils Absolute: 0 10*3/uL (ref 0–0.7)
Eosinophils Relative: 0 %
HCT: 46.1 % (ref 40.0–52.0)
Hemoglobin: 15.6 g/dL (ref 13.0–18.0)
Lymphocytes Relative: 12 %
Lymphs Abs: 1.4 10*3/uL (ref 1.0–3.6)
MCH: 30.2 pg (ref 26.0–34.0)
MCHC: 33.9 g/dL (ref 32.0–36.0)
MCV: 89.1 fL (ref 80.0–100.0)
Monocytes Absolute: 0.8 10*3/uL (ref 0.2–1.0)
Monocytes Relative: 7 %
Neutro Abs: 9.1 10*3/uL — ABNORMAL HIGH (ref 1.4–6.5)
Neutrophils Relative %: 80 %
Platelets: 246 10*3/uL (ref 150–440)
RBC: 5.17 MIL/uL (ref 4.40–5.90)
RDW: 13.1 % (ref 11.5–14.5)
WBC: 11.4 10*3/uL — ABNORMAL HIGH (ref 3.8–10.6)

## 2014-06-28 LAB — COMPREHENSIVE METABOLIC PANEL
ALT: 26 U/L (ref 17–63)
AST: 18 U/L (ref 15–41)
Albumin: 4.4 g/dL (ref 3.5–5.0)
Alkaline Phosphatase: 74 U/L (ref 38–126)
Anion gap: 10 (ref 5–15)
BUN: 17 mg/dL (ref 6–20)
CO2: 27 mmol/L (ref 22–32)
Calcium: 9.4 mg/dL (ref 8.9–10.3)
Chloride: 98 mmol/L — ABNORMAL LOW (ref 101–111)
Creatinine, Ser: 1.01 mg/dL (ref 0.61–1.24)
GFR calc Af Amer: >60 mL/min (ref >60)
GFR calc non Af Amer: >60 mL/min (ref >60)
Glucose, Bld: 139 mg/dL — ABNORMAL HIGH (ref 65–99)
Potassium: 4.2 mmol/L (ref 3.5–5.1)
Sodium: 135 mmol/L (ref 135–145)
Total Bilirubin: 0.4 mg/dL (ref 0.3–1.2)
Total Protein: 7.4 g/dL (ref 6.5–8.1)

## 2014-06-28 LAB — RAPID STREP SCREEN (MED CTR MEBANE ONLY): Streptococcus, Group A Screen (Direct): NEGATIVE

## 2014-06-28 MED ORDER — HYDROCOD POLST-CPM POLST ER 10-8 MG/5ML PO SUER: 5.0000 mL | Freq: Two times a day (BID) | ORAL | Status: DC

## 2014-06-28 MED ORDER — IBUPROFEN 400 MG PO TABS
400.0000 mg | ORAL_TABLET | Freq: Once | ORAL | Status: AC
  Administered 2014-06-28: 400 mg via ORAL

## 2014-06-28 MED ORDER — HYDROCODONE-ACETAMINOPHEN 5-325 MG PO TABS
1.0000 | ORAL_TABLET | Freq: Once | ORAL | Status: AC
  Administered 2014-06-28: 1 via ORAL

## 2014-06-28 NOTE — ED Provider Notes (Signed)
CSN: [id number]     Arrival date & time 06/28/14  1622 History   None    Chief Complaint  Patient presents with  . Fever   (Consider location/radiation/quality/duration/timing/severity/associated sxs/prior Treatment) HPI  59 year old gentleman who states he awoke this morning about 5 AM the high fever a headache and having shaking chills. He states he took 4 ibuprofen and return to sleep. He again awoke at 9 AM again feeling feverish with chills and again took another dose of ibuprofen.This happened again at noon and he came here at 3:30 to be seen. He took his last dose of ibuprofen about 3 PM. At the present time he has no shaking chills and is afebrile. Is not toxic. He states that about a month to month and a half ago he had 2 separate tick bites that he removed they were embedded but not engorged. He has had no rash. He's been having body aches and a sore throat with a tickle in his throat that has caused him to have coughing spasms with productive green sputum. Had nausea but no vomiting  diarrhea or abdominal pain. He has no urinary symptoms. Past Medical History  Diagnosis Date  . Seasonal allergies   . Diabetes    Past Surgical History  Procedure Laterality Date  . Rotator cuff repair Right 2009   Family History  Problem Relation Age of Onset  . Colon cancer Neg Hx   . Esophageal cancer Neg Hx   . Rectal cancer Neg Hx   . Stomach cancer Neg Hx    History  Substance Use Topics  . Smoking status: Former Smoker    Quit date: 01/06/1982  . Smokeless tobacco: Never Used  . Alcohol Use: 7.2 - 10.8 oz/week    12-18 Cans of beer per week    Review of Systems  Constitutional: Positive for fever, chills and fatigue.  HENT: Positive for sore throat.   Respiratory: Positive for cough.   Musculoskeletal: Positive for myalgias and arthralgias.  Neurological: Positive for light-headedness.  All other systems reviewed and are negative.   Allergies  Review of patient's allergies  indicates no known allergies.  Home Medications   Prior to Admission medications   Medication Sig Start Date End Date Taking? Authorizing Provider  insulin detemir (LEVEMIR) 100 UNIT/ML injection Inject 28 Units into the skin every morning.   Yes Historical Provider, MD  lisinopril (PRINIVIL,ZESTRIL) 10 MG tablet Take 10 mg by mouth daily.   Yes Historical Provider, MD  metFORMIN (GLUCOPHAGE) 500 MG tablet Take by mouth 2 (two) times daily with a meal.   Yes Historical Provider, MD  omeprazole (PRILOSEC) 20 MG capsule Take 20 mg by mouth daily.   Yes Historical Provider, MD  simvastatin (ZOCOR) 40 MG tablet Take 40 mg by mouth daily.   Yes Historical Provider, MD  acetaminophen (TYLENOL) 325 MG tablet Take 650 mg by mouth every 6 (six) hours as needed for pain.    Historical Provider, MD  chlorpheniramine-HYDROcodone (TUSSIONEX PENNKINETIC ER) 10-8 MG/5ML SUER Take 5 mLs by mouth 2 (two) times daily. 06/28/14   Lorin Picket, PA-C  doxycycline (VIBRAMYCIN) 100 MG capsule Take 100 mg by mouth 2 (two) times daily.    Historical Provider, MD  glipiZIDE (GLUCOTROL) 5 MG tablet Take 5 mg by mouth daily.    Historical Provider, MD  hydrOXYzine (ATARAX/VISTARIL) 25 MG tablet Take 25 mg by mouth daily.    Historical Provider, MD   BP 113/68 mmHg  Pulse 100  Temp(Src) 98.9 F (37.2 C) (Tympanic)  Resp 18  Ht 5\' 11"  (1.803 m)  Wt 200 lb (90.719 kg)  BMI 27.91 kg/m2  SpO2 97% Physical Exam  Constitutional: He is oriented to person, place, and time. He appears well-developed and well-nourished. No distress.  HENT:  Head: Normocephalic and atraumatic.  Right Ear: External ear normal.  Left Ear: External ear normal.  Mouth/Throat: Oropharynx is clear and moist.  Eyes: EOM are normal. Pupils are equal, round, and reactive to light.  Neck: Normal range of motion. Neck supple.  Cardiovascular: Normal rate, regular rhythm and normal heart sounds.  Exam reveals no gallop and no friction rub.   No  murmur heard. Pulmonary/Chest: Effort normal and breath sounds normal. No stridor. No respiratory distress. He has no wheezes. He has no rales.  Abdominal: Soft. Bowel sounds are normal. He exhibits no distension and no mass. There is no tenderness. There is no rebound and no guarding.  Musculoskeletal: Normal range of motion. He exhibits no edema.  Lymphadenopathy:    He has no cervical adenopathy.  Neurological: He is alert and oriented to person, place, and time. He has normal reflexes. No cranial nerve deficit. Coordination normal.  Skin: Skin is warm and dry. No rash noted. No erythema. No pallor.  Psychiatric: He has a normal mood and affect. His behavior is normal. Judgment and thought content normal.  Nursing note and vitals reviewed.   ED Course  Procedures (including critical care time) Labs Review Labs Reviewed  CBC WITH DIFFERENTIAL/PLATELET - Abnormal; Notable for the following:    WBC 11.4 (*)    Neutro Abs 9.1 (*)    All other components within normal limits  COMPREHENSIVE METABOLIC PANEL - Abnormal; Notable for the following:    Chloride 98 (*)    Glucose, Bld 139 (*)    All other components within normal limits  RAPID STREP SCREEN (NOT AT [facility])  CULTURE, GROUP A STREP ([facility] ONLY)    Imaging Review No results found.   MDM   1. Systemic viral illness    New Prescriptions   CHLORPHENIRAMINE-HYDROCODONE (TUSSIONEX PENNKINETIC ER) 10-8 MG/5ML SUER    Take 5 mLs by mouth 2 (two) times daily.  Plan: 1. Test/x-ray results and diagnosis reviewed with patient 2. rx as per orders; risks, benefits, potential side effects reviewed with patient 3. Recommend supportive treatment with fluids,rest 4. F/u prn if symptoms worsen or don't improve with PCP in 3 days  I told patient that I think he probably has a viral illness accounting for his headache fever and body aches. Is a possibility that he could have a tick fever but he has no rash at the present time keep this under  consideration. I recommended he follow-up with his primary care in 2-3 days follow-up. May return here if he is able to see his primary care or if he needs more immediate care. I have given him a Vicodin here but will have him take ibuprofen or Tylenol at home.    Lorin Picket, PA-C 06/28/14 1830

## 2014-06-28 NOTE — ED Notes (Signed)
Patient states "last night he developed body aches this morning he had a fever of 101.7, is having chills, aches and fever and bad headache"

## 2014-06-28 NOTE — Discharge Instructions (Signed)

## 2014-07-01 LAB — CULTURE, GROUP A STREP (THRC)

## 2014-07-02 NOTE — Progress Notes (Cosign Needed)
ED Antimicrobial Stewardship Positive Culture Follow Up   Curtis Singh is an 59 y.o. male who presented to [facility] on 06/28/2014 with a chief complaint of  Chief Complaint  Patient presents with  . Fever    Recent Results (from the past 720 hour(s))  Rapid strep screen     Status: None   Collection Time: 06/28/14  4:52 PM  Result Value Ref Range Status   Streptococcus, Group A Screen (Direct) NEGATIVE NEGATIVE Final  Culture, group A strep ([facility] only)     Status: None   Collection Time: 06/28/14  4:52 PM  Result Value Ref Range Status   Specimen Description THROAT  Final   Special Requests NONE  Final   Culture   Final    LIGHT GROWTH GROUP B STREP(S.AGALACTIAE)ISOLATED Virtually 100% of S. agalactiae (Group B) strains are susceptible to Penicillin.  For Penicillin-allergic patients, Erythromycin (85-95% sensitive) and Clindamycin (80% sensitive) are drugs of choice. Contact microbiology lab to request sensitivities if  needed within 7 days.    Report Status 07/01/2014 FINAL  Final   [x]  Patient discharged originally without antimicrobial agent and treatment is now indicated  New antibiotic prescription: Amoxicillin 500mg  PO BID x 10 days  ED Provider: Alecia Lemming PA-C   Reginia Naas 07/02/2014, 8:47 AM Infectious Diseases Pharmacist Phone# [phone]

## 2014-07-25 ENCOUNTER — Telehealth: Payer: Self-pay | Admitting: Emergency Medicine

## 2014-07-25 NOTE — ED Notes (Signed)
This was a follow-up call to see how the patient was doing.  Patient states that he is "feeling better" and that he finished his Amoxicillin about 4 days ago.  Patient was instructed to follow-up with his PCP if his symptoms return.  Patient verbalized understanding.

## 2014-08-08 IMAGING — US ABDOMEN ULTRASOUND
1 series · 14 of 25 positions shown · non-contrast
Comparison: none

REASON FOR EXAM: abd pain generalized
COMMENTS:

[Series 1: abdomen ultrasound · 0.36mm/px · 14 of 77 slices shown]
[im 1/77]
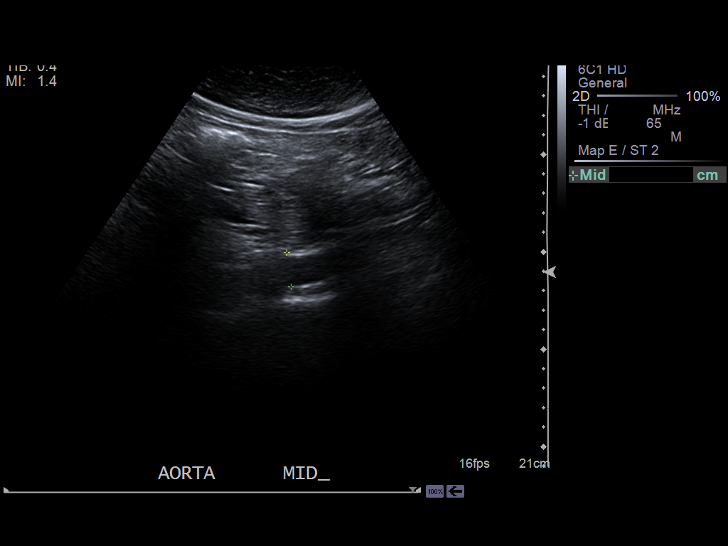
[im 7/77]
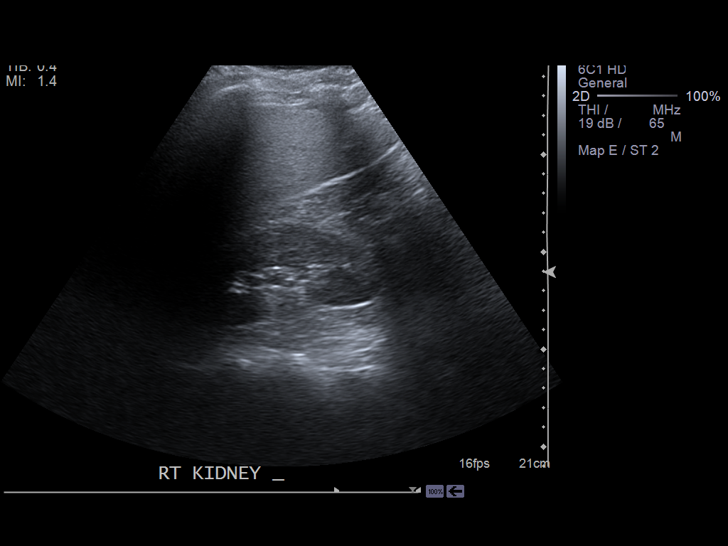
[im 13/77]
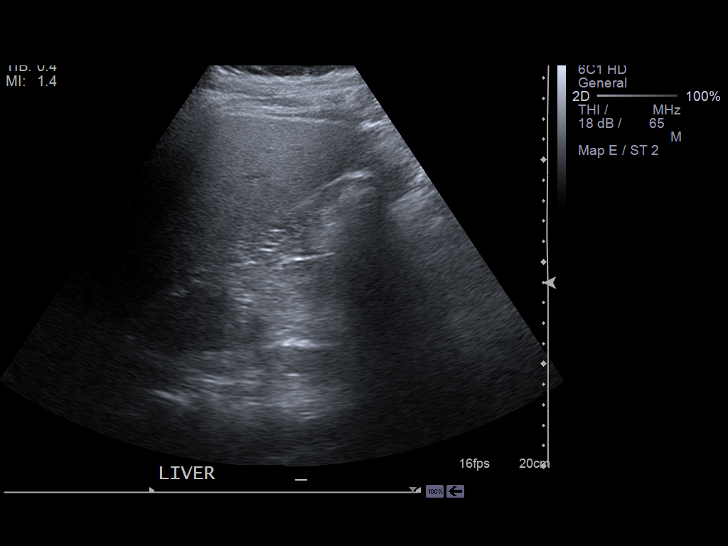
[im 20/77]
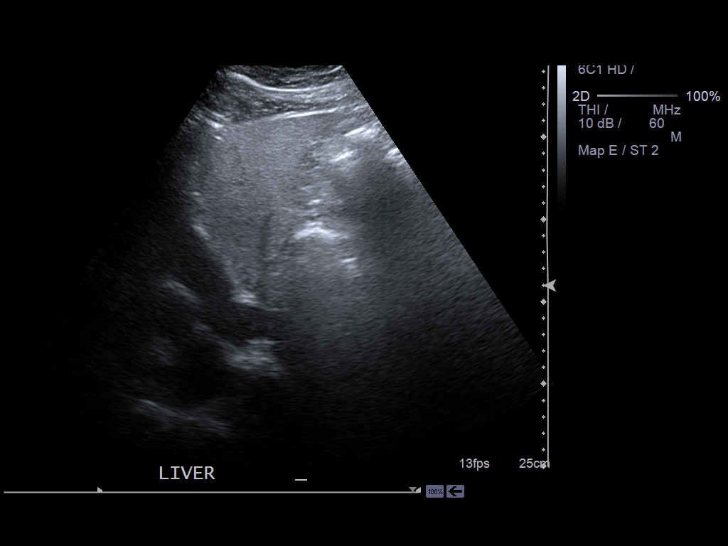
[im 26/77]
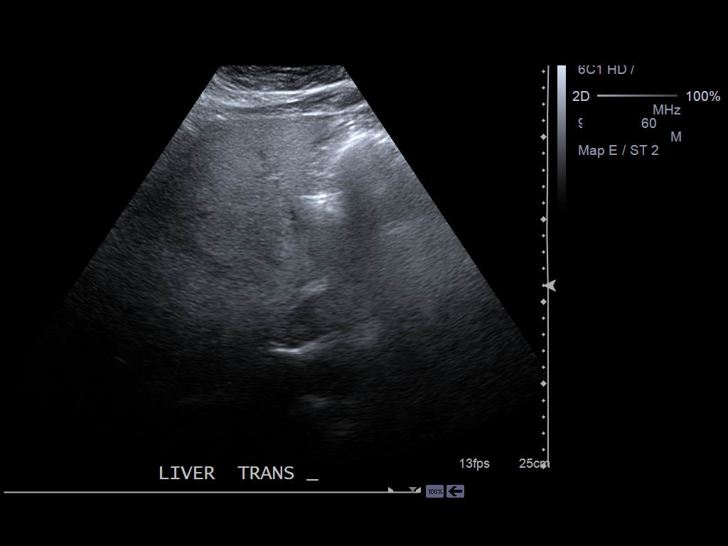
[im 29/77]
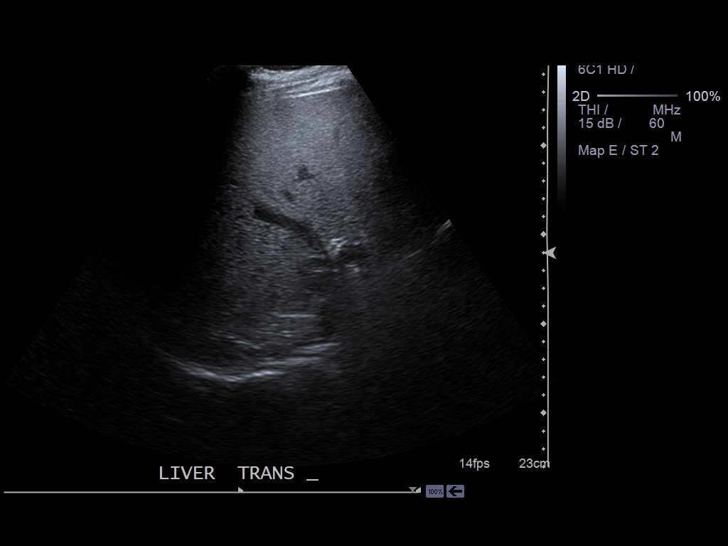
[im 35/77]
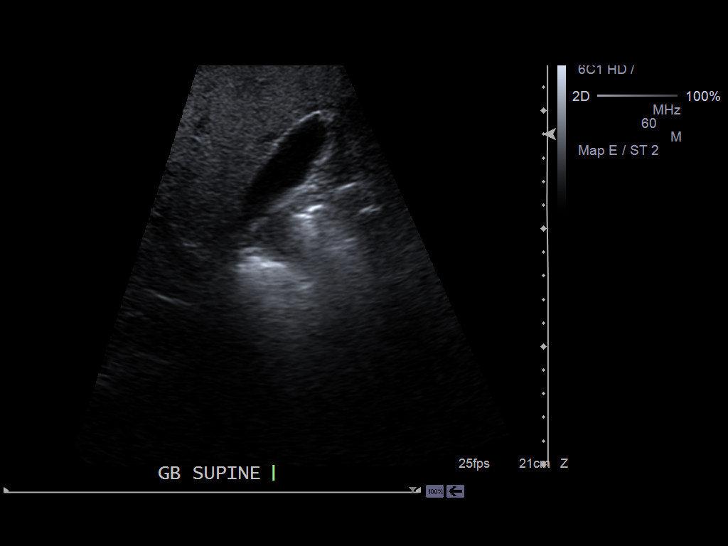
[im 42/77]
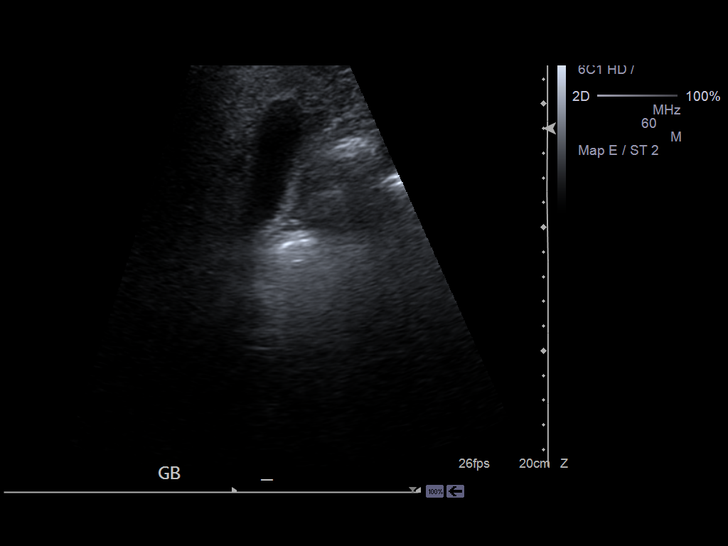
[im 48/77]
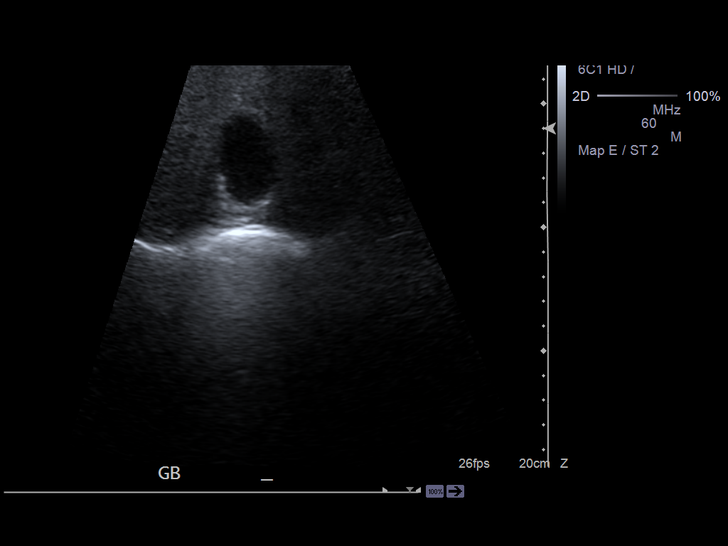
[im 51/77]
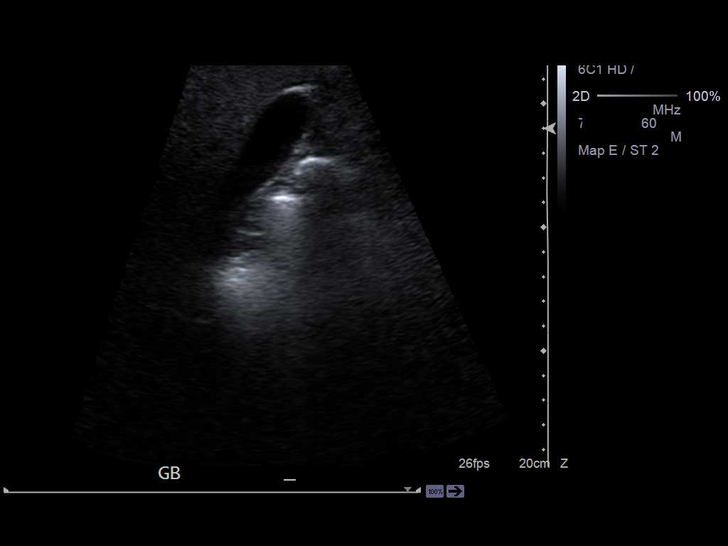
[im 58/77]
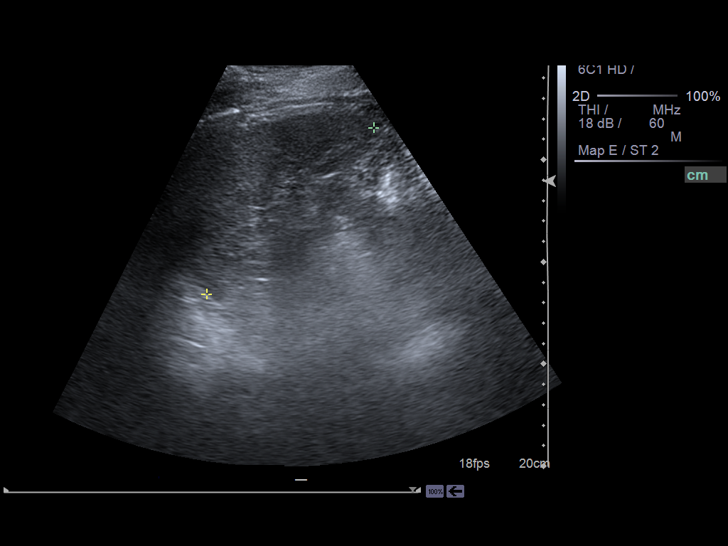
[im 64/77]
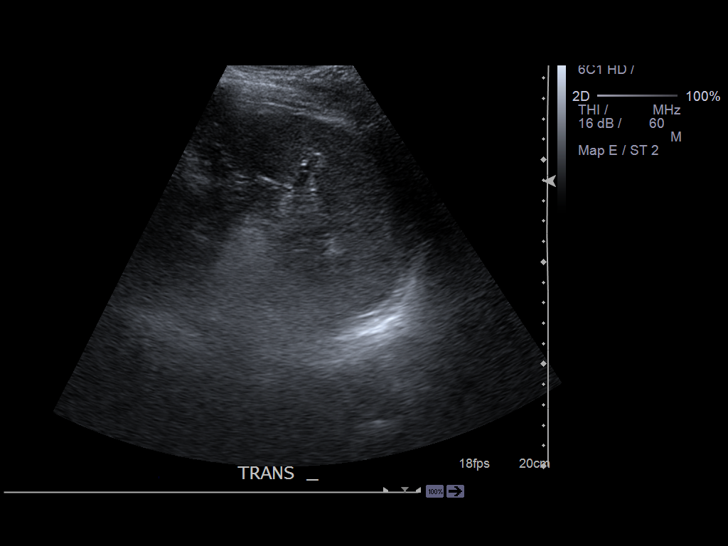
[im 70/77]
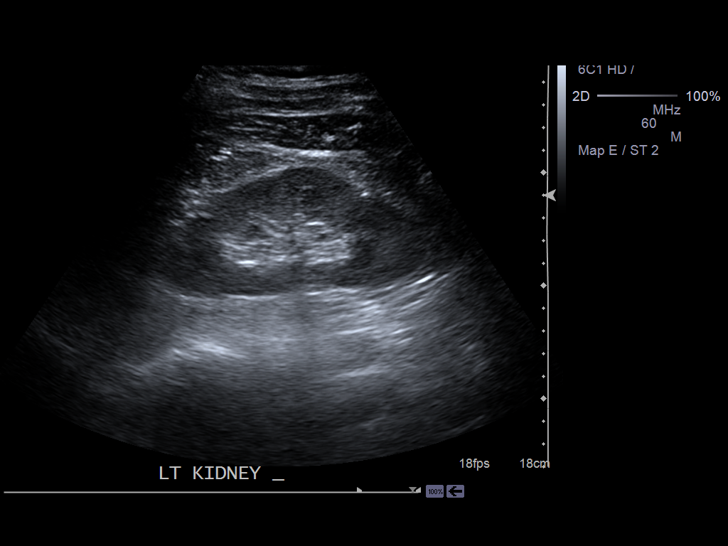
[im 77/77]
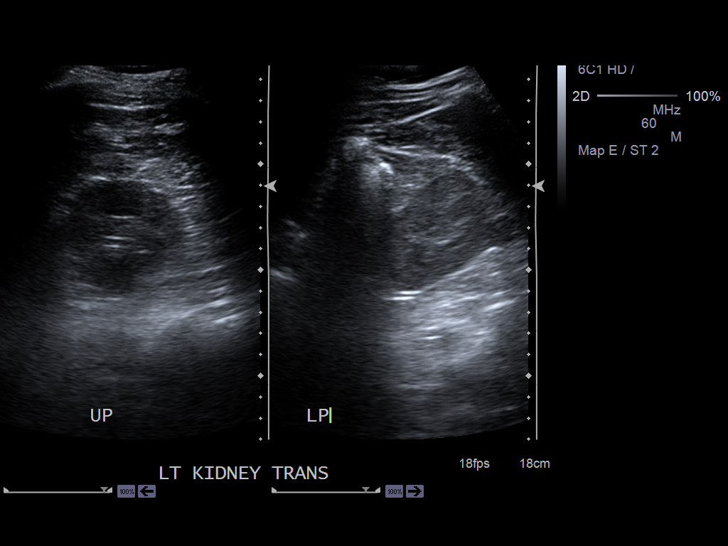

[14 of 25 positions shown; findings below may reference images not displayed]

PROCEDURE:     US  - US ABDOMEN GENERAL SURVEY  - May 18, 2012 [DATE]

RESULT:     Abdominal ultrasound is performed. The visualized abdominal
aorta appears normal. The pancreas is predominantly skewered by overlying
bowel gas. This also obscures portions of the mid to distal aorta. The right
kidney measures 11.51 x 5.60 x 5.49 cm. The cortical thickness of the right
kidney is 1.45 cm. The liver shows increased echogenicity diffusely
consistent with fatty infiltration without a discrete mass or ductal
dilation. The portal venous flow is normal. Common bile duct diameter is
mm. The gallbladder shows no evidence of stones. The gallbladder wall
thickness is 2.0 mm. There is no pericholecystic fluid. A negative
sonographic Murphy's sign is reported. The spleen shows a length of
cm. The left kidney measures 11.99 x 5.56 x 5.79 cm with a cortical
thickness of 1.56 cm. Neither kidney shows obstruction, solid or cystic mass
or stone.
IMPRESSION: Slightly increased echogenicity of the liver. Incomplete
visualization of the pancreas and abdominal aorta. Otherwise, normal
appearing abdominal sonogram.

[REDACTED]

## 2014-08-18 ENCOUNTER — Other Ambulatory Visit: Payer: Self-pay | Admitting: Ophthalmology

## 2014-08-18 ENCOUNTER — Ambulatory Visit
Admission: RE | Admit: 2014-08-18 | Discharge: 2014-08-18 | Disposition: A | Discharge status: Home / Self Care | Payer: 59 | Source: Ambulatory Visit | Attending: Ophthalmology | Admitting: Ophthalmology

## 2014-08-18 DIAGNOSIS — H47391 Other disorders of optic disc, right eye: Secondary | ICD-10-CM | POA: Diagnosis present

## 2014-08-18 DIAGNOSIS — H4710 Unspecified papilledema: Secondary | ICD-10-CM

## 2014-08-18 DIAGNOSIS — G9389 Other specified disorders of brain: Secondary | ICD-10-CM | POA: Diagnosis not present

## 2014-08-18 MED ORDER — GADOBENATE DIMEGLUMINE 529 MG/ML IV SOLN
20.0000 mL | Freq: Once | INTRAVENOUS | Status: AC | PRN
  Administered 2014-08-18: 19 mL via INTRAVENOUS

## 2014-09-21 ENCOUNTER — Other Ambulatory Visit: Payer: Self-pay

## 2014-09-21 NOTE — Patient Outreach (Signed)
[facility]) Care Management  09/21/2014  Curtis Singh June 06, 1955 [id number]   Phone call to schedule appointment.  No answer and message left.  Plan to send appointment request letter. Member was a no show for 05/17/14 appointment and he has not rescheduled his appoinment. Peter Garter RN, [facility] Care Management Coordinator-Link to [facility] [phone]

## 2014-10-18 ENCOUNTER — Other Ambulatory Visit: Payer: Self-pay

## 2014-10-18 DIAGNOSIS — E119 Type 2 diabetes mellitus without complications: Secondary | ICD-10-CM

## 2014-10-18 DIAGNOSIS — E785 Hyperlipidemia, unspecified: Secondary | ICD-10-CM | POA: Insufficient documentation

## 2014-10-18 DIAGNOSIS — E1165 Type 2 diabetes mellitus with hyperglycemia: Secondary | ICD-10-CM | POA: Insufficient documentation

## 2014-10-18 DIAGNOSIS — IMO0002 Reserved for concepts with insufficient information to code with codable children: Secondary | ICD-10-CM | POA: Insufficient documentation

## 2014-10-18 LAB — POCT GLYCOSYLATED HEMOGLOBIN (HGB A1C): Hemoglobin A1C: 6.7

## 2014-10-18 NOTE — Patient Outreach (Signed)
[facility]) Care Management   10/18/2014  Curtis Singh 1955/09/19 [id number]  Curtis Singh is an 59 y.o. male.   Member seen for follow up office visit for Link to Wellness program for self management of Type 2 diabetes  Subjective: Member states that he has been trying hard to work on lowering his blood sugars.  States that he is eating very little CHO.  States that he is in the process of getting a divorced and he is having less stress since he separated from his wife.  States that his vision in his rt eye is slowly improving and he is seeing the eye doctor regularly.  States he is very active at his work and he is also working a part time job which is very strenuous.  States he tries to walk on his days off.    Objective:   Review of Systems  Eyes: Positive for blurred vision.  Glucometer reviewed- 14 day average 146 ranges 128-186 with higher readings in AM  Physical Exam  Today's Vitals   10/18/14 1601  BP: 118/70  Pulse: 64  Resp: 14  Height: 1.803 m (5\' 11" )  Weight: 202 lb 8 oz (91.853 kg)  SpO2: 98%  PainSc: 0-No pain    Current Medications:   Current Outpatient Prescriptions  Medication Sig Dispense Refill  . atorvastatin (LIPITOR) 20 MG tablet Take 20 mg by mouth daily.    . insulin detemir (LEVEMIR) 100 UNIT/ML injection Inject 45 Units into the skin every morning.     . loratadine (CLARITIN) 10 MG tablet Take 10 mg by mouth daily.    Marland Kitchen losartan (COZAAR) 50 MG tablet Take 50 mg by mouth daily.    . metFORMIN (GLUCOPHAGE) 500 MG tablet Take by mouth 2 (two) times daily with a meal.    . zolpidem (AMBIEN) 10 MG tablet Take 10 mg by mouth at bedtime as needed for sleep.    Marland Kitchen acetaminophen (TYLENOL) 325 MG tablet Take 650 mg by mouth every 6 (six) hours as needed for pain.    . chlorpheniramine-HYDROcodone (TUSSIONEX PENNKINETIC ER) 10-8 MG/5ML SUER Take 5 mLs by mouth 2 (two) times daily. (Patient not taking: Reported on 10/18/2014) 140 mL 0  .  doxycycline (VIBRAMYCIN) 100 MG capsule Take 100 mg by mouth 2 (two) times daily.    Marland Kitchen glipiZIDE (GLUCOTROL) 5 MG tablet Take 5 mg by mouth daily.    . hydrOXYzine (ATARAX/VISTARIL) 25 MG tablet Take 25 mg by mouth daily.    Marland Kitchen lisinopril (PRINIVIL,ZESTRIL) 10 MG tablet Take 10 mg by mouth daily.    Marland Kitchen omeprazole (PRILOSEC) 20 MG capsule Take 20 mg by mouth daily.    . simvastatin (ZOCOR) 40 MG tablet Take 40 mg by mouth daily.     No current facility-administered medications for this visit.    Functional Status:   In your present state of health, do you have any difficulty performing the following activities: 10/18/2014  Hearing? N  Vision? N  Difficulty concentrating or making decisions? N  Walking or climbing stairs? N  Dressing or bathing? N  Doing errands, shopping? N    Fall/Depression Screening:    PHQ 2/9 Scores 10/18/2014  PHQ - 2 Score 0    Assessment:   Member seen for follow up office visit for Link to Wellness program for self management of Type 2 diabetes.  Member is meeting diabetes self management goal with hemoglobin A1C of 6.7 today which is a decrease from  10.2.  Member reports following low CHO diet and is getting regular exercise.  Plan:   1. Plan to eat 45-60 GM (3-4) servings of carbohydrate a meal and 15 GM for snacks.  Try eating an afternoon snack with protein and carbohydrate 2. Plan to check blood sugar 1-2 times a day either fasting or 1 -2hrs after a meal.  Goals of 80-130 fasting and less than 180 after eating. 3. Plan to walk for 30 minutes 3 days a week.  Goal is to work up to 150 minutes 4. Plan to see Dr. Lavera Guise in November 5. Plan to see Link to Wellness on 02/14/15 at [place] Problem One        Most Recent Value   Care Plan Problem One  Potential for elevated blood sugars   Role Documenting the Problem One  Care Management [place] for Problem One  Active   [facility] Long Term Goal (31-90 days)  Member will maintain  hemoglobin A1C at or below 7 for the next 90 days   [facility] Long Term Goal Start Date  10/18/14   Interventions for Problem One Long Term Goal  Reviewed CHO counting and portion control, Instructed to try snacking on nuts and to include protein with a CHO for his snacks, reinforced importance of regular exercise for glycemic control, Praised for decreasing his hemoglobin A1C to 6.7, Reviewed goals for hemoglobin A1C and blood sugars, Discussed role stress can have on blood sugars and encourged to see Employee Assistance Program if needed     Peter Garter RN, [facility] Care Management Coordinator-Link to [place] Management [phone]

## 2014-10-19 NOTE — Patient Instructions (Signed)
1. Plan to eat 45-60 GM (3-4) servings of carbohydrate a meal and 15 GM for snacks.  Try eating an afternoon snack with protein and carbohydrate 2. Plan to check blood sugar 1-2 times a day either fasting or 1 -2hrs after a meal.  Goals of 80-130 fasting and less than 180 after eating. 3. Plan to walk for 30 minutes 3 days a week.  Goal is to work up to 150 minutes 4. Plan to see Dr. Lavera Guise in November 5. Plan to see Link to Wellness on 02/14/15 at [facility]

## 2014-11-03 ENCOUNTER — Encounter: Payer: Self-pay | Admitting: *Deleted

## 2015-01-26 DIAGNOSIS — N529 Male erectile dysfunction, unspecified: Secondary | ICD-10-CM | POA: Diagnosis not present

## 2015-01-26 DIAGNOSIS — E7800 Pure hypercholesterolemia, unspecified: Secondary | ICD-10-CM | POA: Diagnosis not present

## 2015-01-26 DIAGNOSIS — E119 Type 2 diabetes mellitus without complications: Secondary | ICD-10-CM | POA: Diagnosis not present

## 2015-01-30 DIAGNOSIS — E119 Type 2 diabetes mellitus without complications: Secondary | ICD-10-CM | POA: Diagnosis not present

## 2015-01-30 DIAGNOSIS — K219 Gastro-esophageal reflux disease without esophagitis: Secondary | ICD-10-CM | POA: Diagnosis not present

## 2015-02-02 DIAGNOSIS — N529 Male erectile dysfunction, unspecified: Secondary | ICD-10-CM | POA: Diagnosis not present

## 2015-02-02 DIAGNOSIS — R0789 Other chest pain: Secondary | ICD-10-CM | POA: Diagnosis not present

## 2015-02-08 DIAGNOSIS — K219 Gastro-esophageal reflux disease without esophagitis: Secondary | ICD-10-CM | POA: Diagnosis not present

## 2015-02-08 DIAGNOSIS — E119 Type 2 diabetes mellitus without complications: Secondary | ICD-10-CM | POA: Diagnosis not present

## 2015-02-08 DIAGNOSIS — N529 Male erectile dysfunction, unspecified: Secondary | ICD-10-CM | POA: Diagnosis not present

## 2015-02-14 ENCOUNTER — Ambulatory Visit: Payer: Self-pay

## 2015-02-19 IMAGING — CR DG SHOULDER 3+V*L*
1 series · 4 of 4 positions shown · non-contrast
Comparison: None.

CLINICAL DATA: Left shoulder pain after lifting [place] yesterday,
acute on chronic left-sided shoulder pain.

EXAM:
DG SHOULDER 3+VIEWS LEFT

[Series 1: grashey · 0.17mm/px · 4 of 4 slices shown]
[im 1/4]
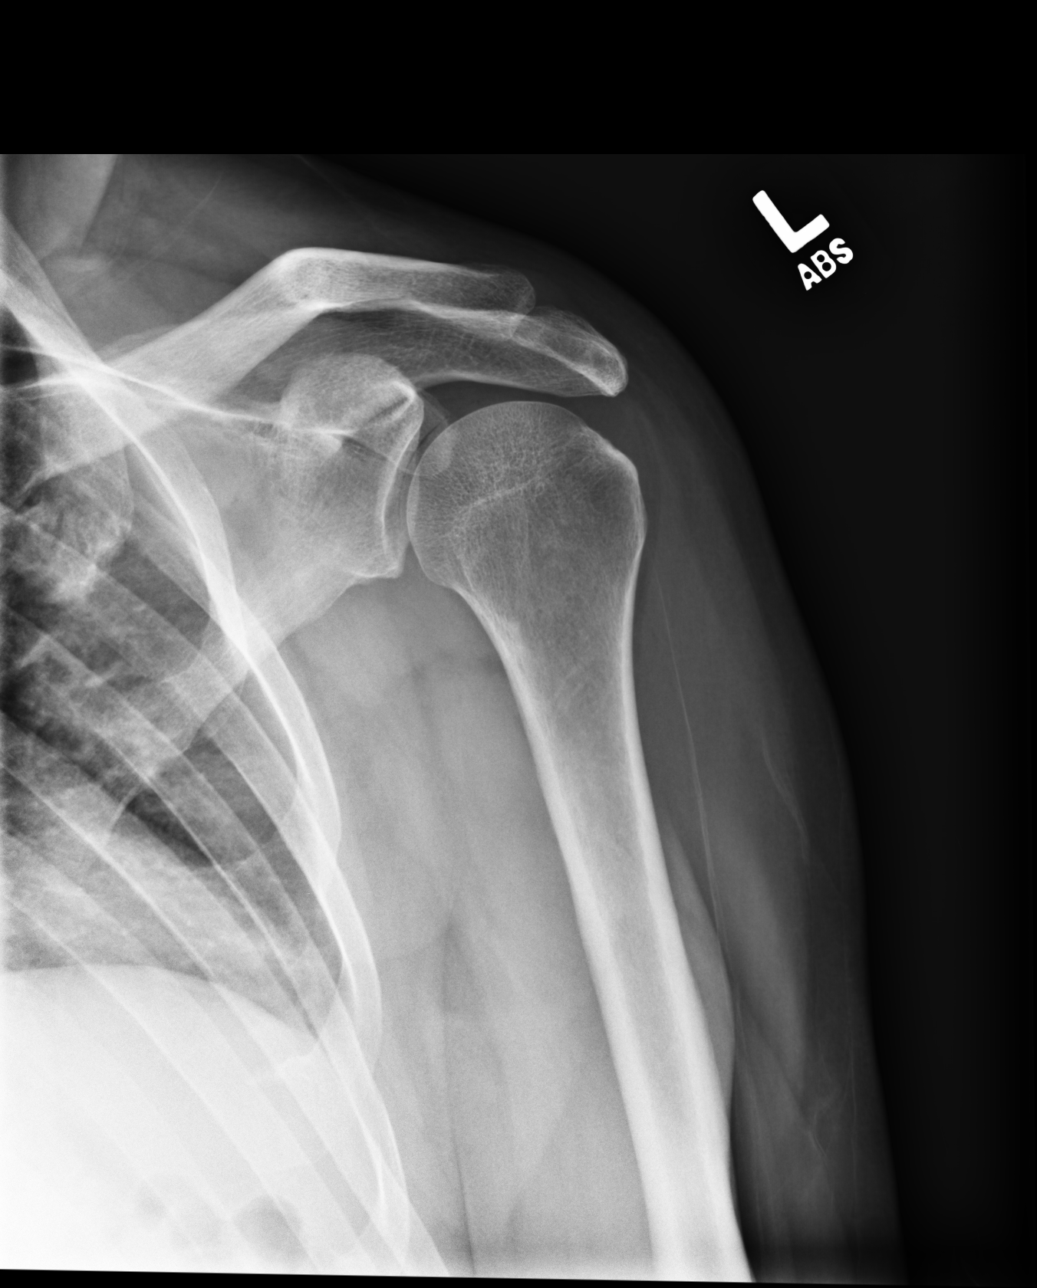
[im 2/4]
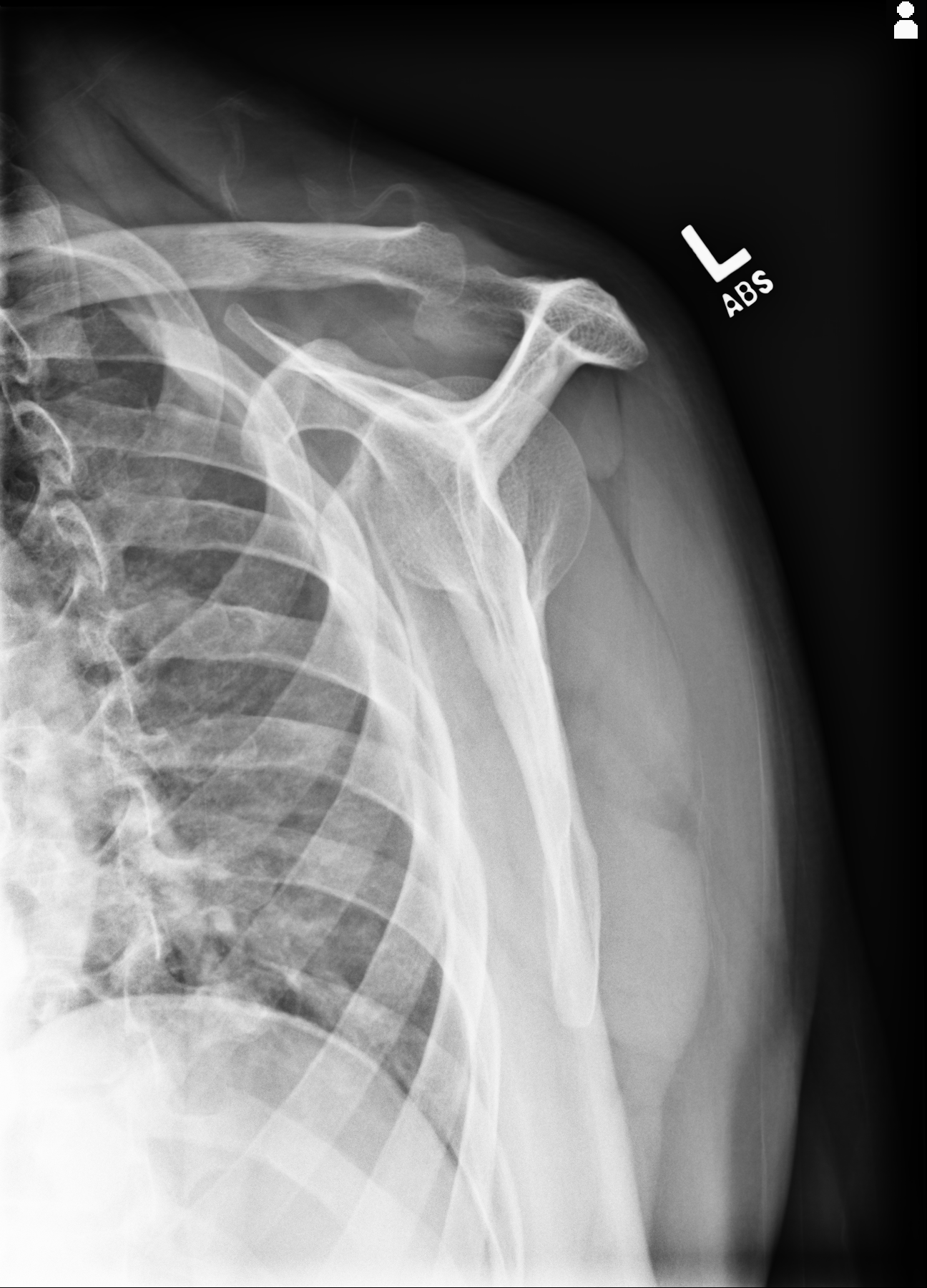
[im 3/4]
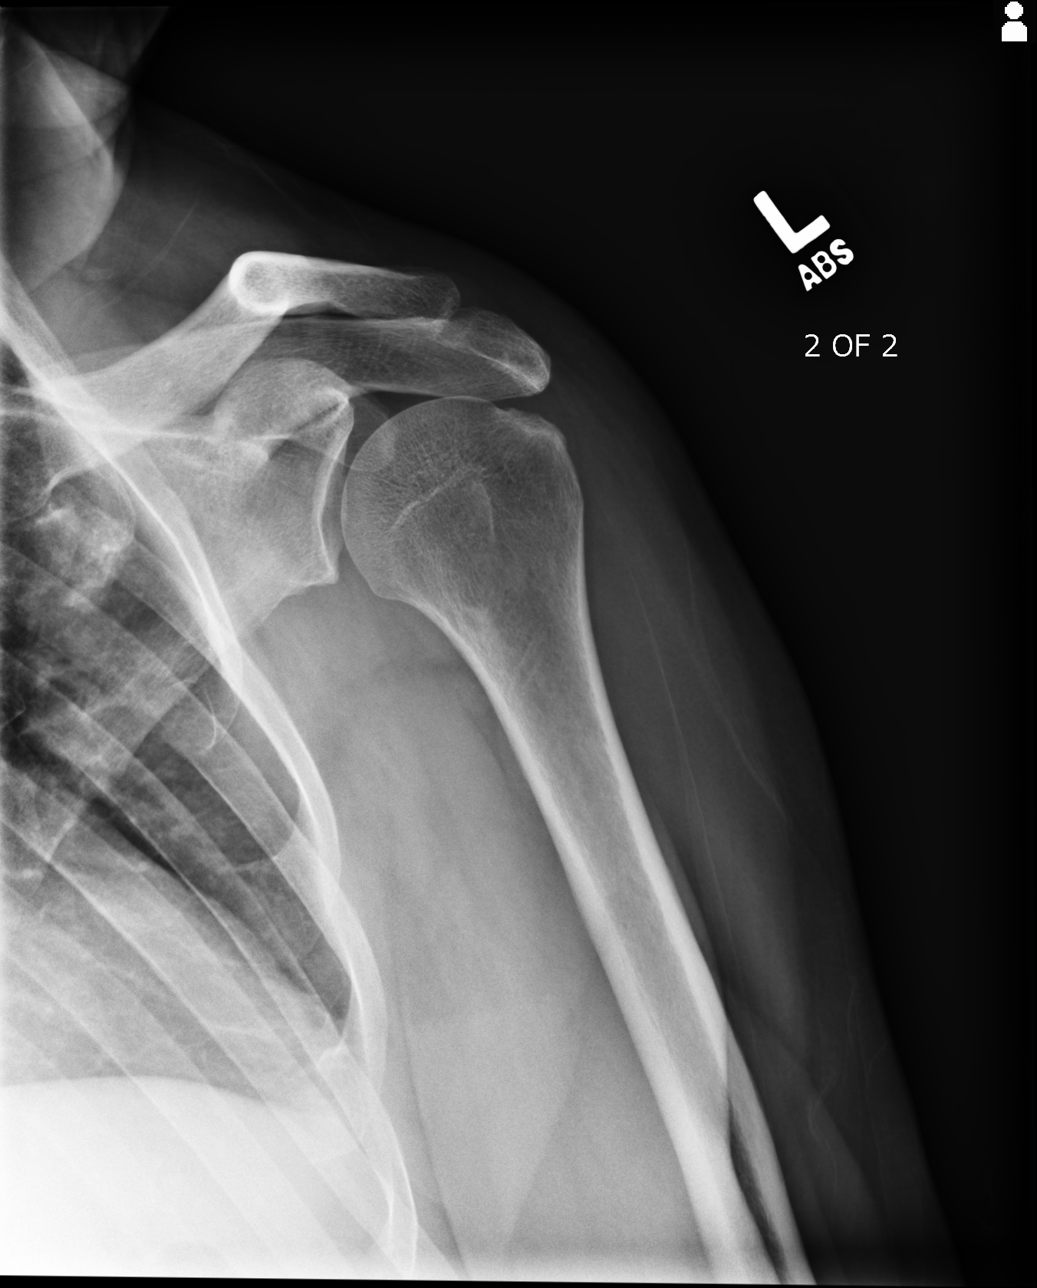
[im 4/4]
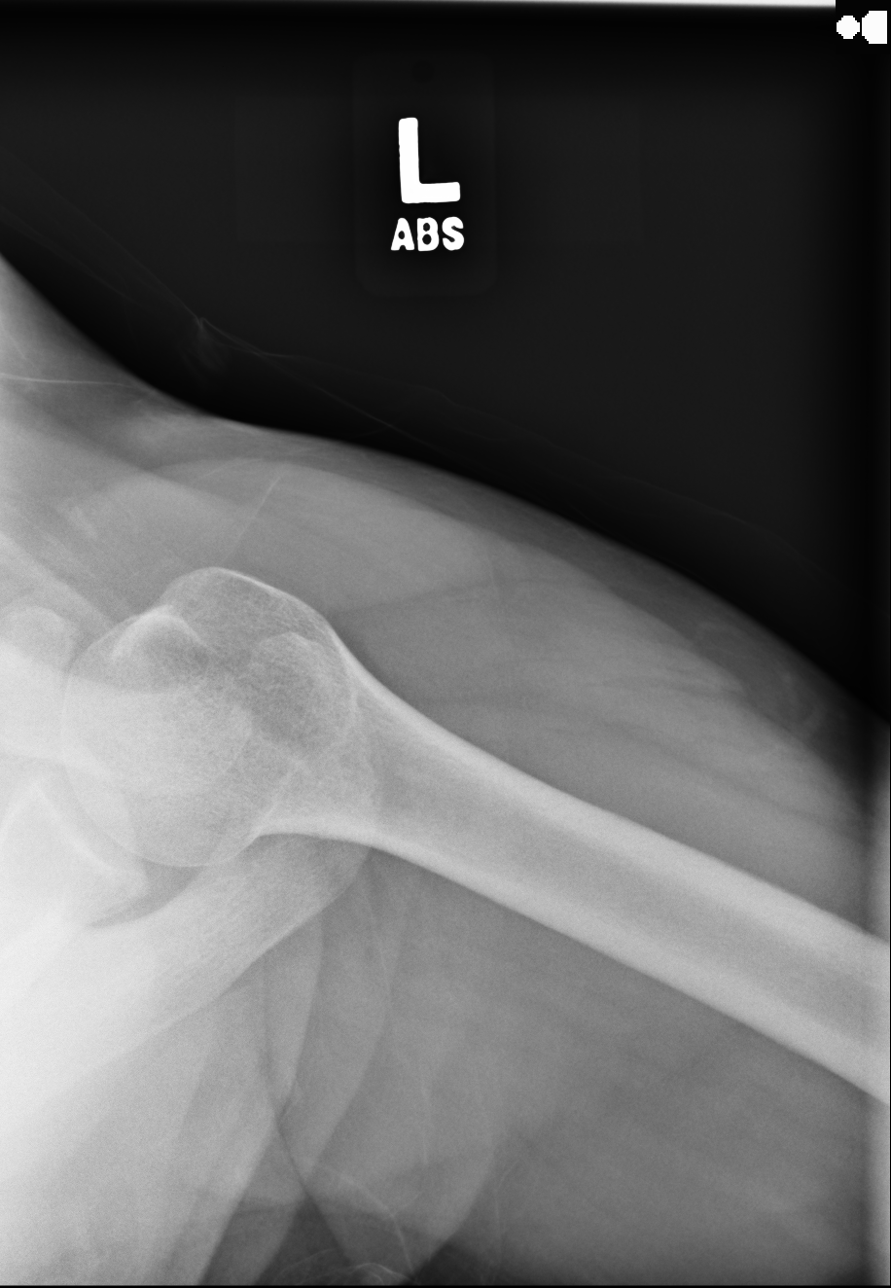

[4 of 4 positions shown; findings below may reference images not displayed]

FINDINGS: No fracture or dislocation. The glenohumeral and acromioclavicular
joint spaces appear preserved given obliquity. No evidence of
calcific tendinitis. Regional soft tissues are normal. Limited
visualization adjacent thorax is normal.
IMPRESSION: No explanation for patient's acute on chronic left-sided shoulder
pain. Further evaluation may be obtained with a nonemergent shoulder
MRI as clinically indicated.

## 2015-02-20 ENCOUNTER — Ambulatory Visit (INDEPENDENT_AMBULATORY_CARE_PROVIDER_SITE_OTHER): Payer: 59 | Admitting: Urology

## 2015-02-20 ENCOUNTER — Encounter: Payer: Self-pay | Admitting: Urology

## 2015-02-20 ENCOUNTER — Telehealth: Payer: Self-pay | Admitting: Urology

## 2015-02-20 ENCOUNTER — Ambulatory Visit: Payer: Self-pay | Admitting: Urology

## 2015-02-20 VITALS — BP 174/81 | HR 60 | Ht 71.0 in | Wt 194.6 lb

## 2015-02-20 DIAGNOSIS — N529 Male erectile dysfunction, unspecified: Secondary | ICD-10-CM

## 2015-02-20 DIAGNOSIS — E291 Testicular hypofunction: Secondary | ICD-10-CM

## 2015-02-20 DIAGNOSIS — N401 Enlarged prostate with lower urinary tract symptoms: Secondary | ICD-10-CM

## 2015-02-20 DIAGNOSIS — N528 Other male erectile dysfunction: Secondary | ICD-10-CM | POA: Diagnosis not present

## 2015-02-20 DIAGNOSIS — R972 Elevated prostate specific antigen [PSA]: Secondary | ICD-10-CM | POA: Diagnosis not present

## 2015-02-20 DIAGNOSIS — N138 Other obstructive and reflux uropathy: Secondary | ICD-10-CM

## 2015-02-20 NOTE — Progress Notes (Signed)
02/20/2015 12:24 PM   Curtis Singh May 18, 1955 [id number]  Referring provider: Cletis Athens, MD [address] [place], [place] [address]  Chief Complaint  Patient presents with  . Other    new pt    HPI: Patient is a 60 year old Caucasian male who is referred to [place] by his PCP, Dr. Lavera Guise, for erectile dysfunction and possible hypogonadism.    Erectile dysfunction His SHIM score is 7, which is severe ED.   He has been having difficulty with erections for over one year.   His major complaint is the confidence to achieve an erection.  His libido is preserved.   His risk factors for ED are diabetes, hyperlipidemia, BPH and age.  He denies any painful erections or curvatures with his erections.   He has tried PDE5-inhibitors in the past with variable effectiveness.        SHIM      02/20/15 1200       SHIM: Over the last 6 months:   How do you rate your confidence that you could get and keep an erection? Low     When you had erections with sexual stimulation, how often were your erections hard enough for penetration (entering your partner)? Almost Never or Never     During sexual intercourse, how often were you able to maintain your erection after you had penetrated (entered) your partner? Extremely Difficult     During sexual intercourse, how difficult was it to maintain your erection to completion of intercourse? Extremely Difficult     When you attempted sexual intercourse, how often was it satisfactory for you? Very Difficult     SHIM Total Score   SHIM 7        Score: 1-7 Severe ED 8-11 Moderate ED 12-16 Mild-Moderate ED 17-21 Mild ED 22-25 No ED  Hypogonadism Patient is experiencing a decrease in libido, a lack of energy, a decrease in strength, a loss in height, a decreased enjoyment in life, sadness and/or grumpiness, erections being less strong, a recent deterioration in an ability to play sports and a recent deterioration in their work performance.  This is  indicated by his responses to the ADAM questionnaire.  He has also noted a lack of spontaneous nocturnal tumescence.        Androgen Deficiency in the Aging Male      02/20/15 1200       Androgen Deficiency in the Aging Male   Do you have a decrease in libido (sex drive) Yes     Do you have lack of energy Yes     Do you have a decrease in strength and/or endurance Yes     Have you lost height Yes     Have you noticed a decreased "enjoyment of life" Yes     Are you sad and/or grumpy Yes     Are your erections less strong Yes     Have you noticed a recent deterioration in your ability to play sports Yes     Are you falling asleep after dinner No     Has there been a recent deterioration in your work performance Yes        BPH WITH LUTS His IPSS score today is 5, which is mild lower urinary tract symptomatology. He is pleased with his quality life due to his urinary symptoms.  He denies any dysuria, hematuria or suprapubic pain.  He also denies any recent fevers, chills, nausea or vomiting.  He does not have a family history of PCa.     IPSS      02/20/15 1100       International Prostate Symptom Score   How often have you had the sensation of not emptying your bladder? Not at All     How often have you had to urinate less than every two hours? Not at All     How often have you found you stopped and started again several times when you urinated? Less than 1 in 5 times     How often have you found it difficult to postpone urination? Not at All     How often have you had a weak urinary stream? Less than half the time     How often have you had to strain to start urination? Less than 1 in 5 times     How many times did you typically get up at night to urinate? 1 Time     Total IPSS Score 5     Quality of Life due to urinary symptoms   If you were to spend the rest of your life with your urinary condition just the way it is now how would you feel about that? Pleased        Score:   1-7 Mild 8-19 Moderate 20-35 Severe   PMH: Past Medical History  Diagnosis Date  . Seasonal allergies   . Diabetes ([place])   . 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 Rotator cuff repair Right 2009    Home Medications:    Medication List       This list is accurate as of: 02/20/15 12:24 PM.  Always use your most recent med list.               atorvastatin 20 MG tablet  Commonly known as:  LIPITOR  Take 20 mg by mouth daily.     ibuprofen 800 MG tablet  Commonly known as:  ADVIL,MOTRIN  Take 800 mg by mouth every 8 (eight) hours as needed.     insulin detemir 100 UNIT/ML injection  Commonly known as:  LEVEMIR  Inject 45 Units into the skin every morning.     metFORMIN 500 MG tablet  Commonly known as:  GLUCOPHAGE  Take by mouth 2 (two) times daily with a meal.     zolpidem 10 MG tablet  Commonly known as:  AMBIEN  Take 10 mg by mouth at bedtime as needed for sleep.        Allergies: No Known Allergies  Family History: Family History  Problem Relation Age of Onset  . Colon cancer Neg Hx   . Esophageal cancer Neg Hx   . Rectal cancer Neg Hx   . Stomach cancer Neg Hx   . Kidney cancer Neg Hx   . Kidney disease Neg Hx   . Prostate cancer Neg Hx   . Urolithiasis Neg Hx     Social History:  reports that he quit smoking about 33 years ago. He has never used smokeless tobacco. He reports that he drinks about 7.2 - 10.8 oz of alcohol per week. He reports that he does not use illicit drugs.  ROS: UROLOGY Frequent Urination?: No Hard to postpone urination?: Yes Burning/pain with urination?: No Get up at night to urinate?: Yes Leakage of urine?: No Urine stream starts and stops?: No Trouble starting stream?: No  Do you have to strain to urinate?: No Blood in urine?: No Urinary tract infection?: No Sexually transmitted disease?: No Injury to kidneys or bladder?:  No Painful intercourse?: No Weak stream?: Yes Erection problems?: Yes Penile pain?: No  Gastrointestinal Nausea?: No Vomiting?: No Indigestion/heartburn?: No Diarrhea?: No Constipation?: No  Constitutional Fever: No Night sweats?: Yes Weight loss?: Yes Fatigue?: Yes  Skin Skin rash/lesions?: No Itching?: No  Eyes Blurred vision?: Yes Double vision?: Yes  Ears/Nose/Throat Sore throat?: No Sinus problems?: No  Hematologic/Lymphatic Swollen glands?: No Easy bruising?: Yes  Cardiovascular Leg swelling?: No Chest pain?: No  Respiratory Cough?: No Shortness of breath?: No  Endocrine Excessive thirst?: No  Musculoskeletal Back pain?: No Joint pain?: No  Neurological Headaches?: Yes Dizziness?: No  Psychologic Depression?: Yes Anxiety?: Yes  Physical Exam: BP 174/81 mmHg  Pulse 60  Ht 5\' 11"  (1.803 m)  Wt 194 lb 9.6 oz (88.27 kg)  BMI 27.15 kg/m2  Constitutional: Well nourished. Alert and oriented, No acute distress. HEENT: [place] AT, moist mucus membranes. Trachea midline, no masses. Cardiovascular: No clubbing, cyanosis, or edema. Respiratory: Normal respiratory effort, no increased work of breathing. GI: Abdomen is soft, non tender, non distended, no abdominal masses. Liver and spleen not palpable.  No hernias appreciated.  Stool sample for occult testing is not indicated.   GU: No CVA tenderness.  No bladder fullness or masses.  Patient with circumcised phallus.   Urethral meatus is patent.  No penile discharge. No penile lesions or rashes. Scrotum without lesions, cysts, rashes and/or edema.  Testicles are located scrotally bilaterally. No masses are appreciated in the testicles. Left and right epididymis are normal. Rectal: Patient with  normal sphincter tone. Anus and perineum without scarring or rashes. No rectal masses are appreciated. Prostate is approximately 50  grams, no nodules are appreciated. Seminal vesicles are normal. Skin: No rashes,  bruises or suspicious lesions. Lymph: No cervical or inguinal adenopathy. Neurologic: Grossly intact, no focal deficits, moving all 4 extremities. Psychiatric: Normal mood and affect.  Laboratory Data: Lab Results  Component Value Date   WBC 11.4* 06/28/2014   HGB 15.6 06/28/2014   HCT 46.1 06/28/2014   MCV 89.1 06/28/2014   PLT 246 06/28/2014   Lab Results  Component Value Date   CREATININE 1.01 06/28/2014   Lab Results  Component Value Date   HGBA1C 6.7 10/18/2014      Component Value Date/Time   CHOL 239* 01/20/2014 0729   HDL 48 01/20/2014 0729   VLDL 34 01/20/2014 0729   LDLCALC 157* 01/20/2014 0729   Lab Results  Component Value Date   AST 18 06/28/2014   Lab Results  Component Value Date   ALT 26 06/28/2014    Assessment & Plan:    1. Erectile dysfunction:   Patient has tried Viagra and Cialis in the past with sub optimal results.  He would like to try intercavernous injections.  I have called in a prescription to [facility] for the Trimix, 1 mL syringe, test dose.  He will RTC on Thursday for a test dose injection.  2. Hypogonadism:   Patient did answer yes to most of the questions in the ADAM questionnaire.  His hypogonadal symptoms may be due to his erectile dysfunction versus true hypogonadism. We will reassess after his response to the trial of Trimix.  3.BPH with LUTS:   IPSS score is 5/1.  We will continue to monitor. He will return in 1 year for I PSS score, exam and  PSA.  - PSA   Return for schedule trimix injection test dose.  These notes generated with voice recognition software. I apologize for typographical errors.  Zara Council, [facility] [address], [place], [place] [address] [phone]

## 2015-02-20 NOTE — Telephone Encounter (Signed)
Would you call in the [facility] and get this gentleman Trimix?  I want the syring at 1.0 mL so I have room to negotiate the dose.

## 2015-02-21 ENCOUNTER — Telehealth: Payer: Self-pay

## 2015-02-21 DIAGNOSIS — N529 Male erectile dysfunction, unspecified: Secondary | ICD-10-CM | POA: Insufficient documentation

## 2015-02-21 LAB — PSA: Prostate Specific Ag, Serum: 0.5 ng/mL (ref 0.0–4.0)

## 2015-02-21 NOTE — Telephone Encounter (Signed)
Spoke with pt in reference to PSA results. Pt voiced understanding.  

## 2015-02-21 NOTE — Telephone Encounter (Signed)
-----   Message from Nori Riis, PA-C sent at 02/21/2015  7:37 AM EST ----- PSA is normal.

## 2015-02-21 NOTE — Telephone Encounter (Signed)
Medication was called into [place].

## 2015-02-22 ENCOUNTER — Encounter: Payer: Self-pay | Admitting: Urology

## 2015-02-22 ENCOUNTER — Ambulatory Visit (INDEPENDENT_AMBULATORY_CARE_PROVIDER_SITE_OTHER): Payer: 59 | Admitting: Urology

## 2015-02-22 VITALS — BP 144/69 | HR 64 | Ht 71.0 in | Wt 196.3 lb

## 2015-02-22 DIAGNOSIS — N528 Other male erectile dysfunction: Secondary | ICD-10-CM

## 2015-02-22 DIAGNOSIS — N529 Male erectile dysfunction, unspecified: Secondary | ICD-10-CM

## 2015-02-22 NOTE — Progress Notes (Signed)
Patient's left corpus cavernosum is identified.  An area near the base of the penis is cleansed with rubbing alcohol.  Careful to avoid the dorsal vein, 6 mcg of Trimix is injected at a 90 degree angle into the left corpus cavernosum near the base of the penis.  Patient experienced a very firm erection in 15 minutes.  Patient is advised of the symptoms of priapism, erection lasting longer than 4 hours and be painful. If he experiences he should have to the emergency room for treatment.  He will contact me in the morning with the effectiveness of the injection.

## 2015-02-23 ENCOUNTER — Telehealth: Payer: Self-pay | Admitting: Urology

## 2015-02-23 NOTE — Telephone Encounter (Signed)
Patient had great results with 6 mcg of Trimix.  I have called [facility] for his prescription.  5, 1 mL syringes with 6 refills.

## 2015-02-23 NOTE — Telephone Encounter (Signed)
Curtis Singh saw patient yesterday, 2/16, and told the patient to call our office today to give her an update.  Pt called and wanted to let Curtis Singh know that "everything went well".  If Curtis Singh needs details, please give patient a call.

## 2015-05-07 ENCOUNTER — Encounter: Payer: Self-pay | Admitting: Internal Medicine

## 2015-06-19 IMAGING — MR MRI OF THE LEFT SHOULDER WITHOUT CONTRAST
5 series · 40 of 40 positions shown · non-contrast
Comparison: Radiographs 11/29/2012.

CLINICAL DATA: Left shoulder and upper arm pain with limited range
of motion. No recent injury or prior relevant surgery.

EXAM:
MRI OF THE LEFT SHOULDER WITHOUT CONTRAST
TECHNIQUE: Multiplanar, multisequence MR imaging of the shoulder was performed.
No intravenous contrast was administered.

[Series 3: T2 fat-sat · axial · 4.0mm · 0.47mm/px · z∈[-26,+58]mm · 8 of 20 slices shown (1 of 2)]
[im 1/20]
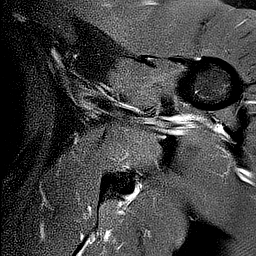
[im 3/20]
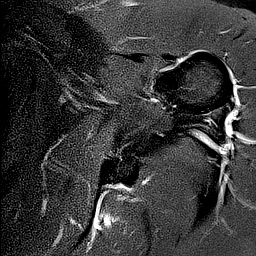
[im 6/20]
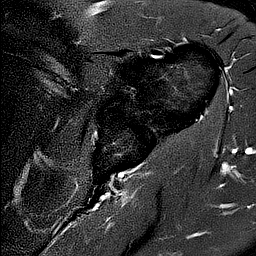
[im 9/20]
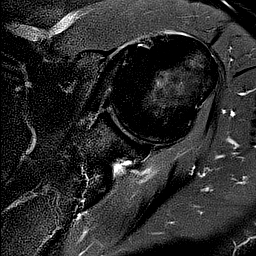
[im 11/20]
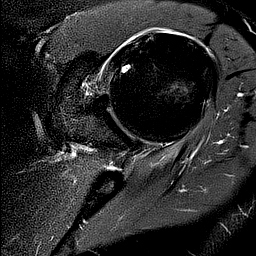
[im 14/20]
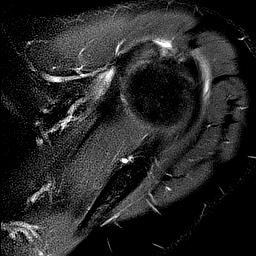
[im 17/20]
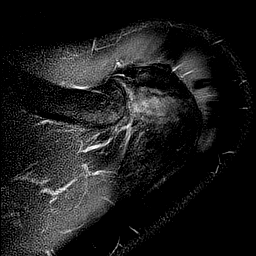
[im 20/20]
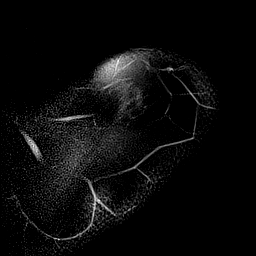

[Series 4: T2 · oblique · 4.0mm · 0.55mm/px · 7 of 17 slices shown]
[im 1/17]
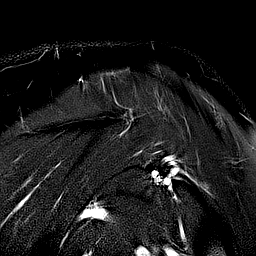
[im 3/17]
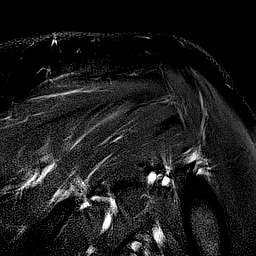
[im 6/17]
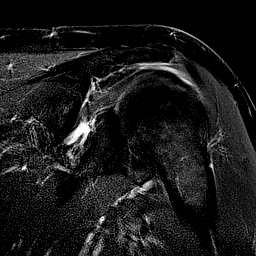
[im 9/17]
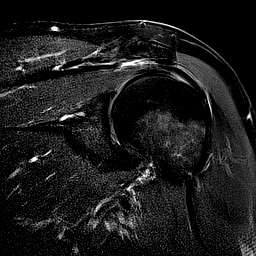
[im 11/17]
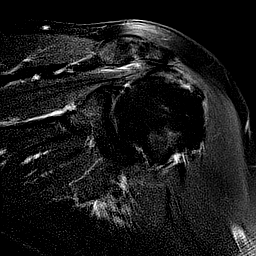
[im 14/17]
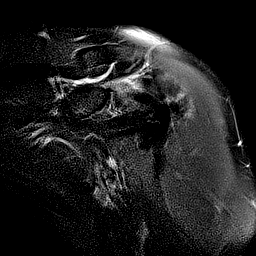
[im 17/17]
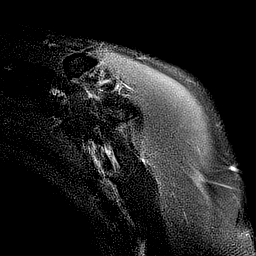

[Series 5: PD · oblique · 4.0mm · 0.62mm/px · 7 of 17 slices shown]
[im 1/17]
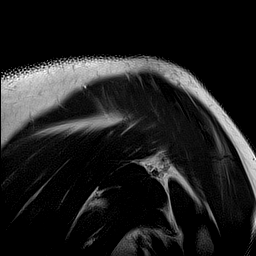
[im 3/17]
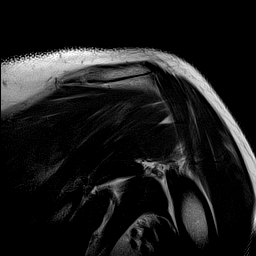
[im 6/17]
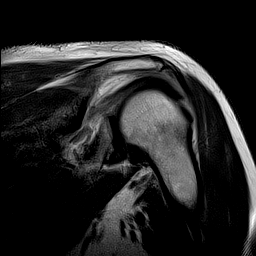
[im 9/17]
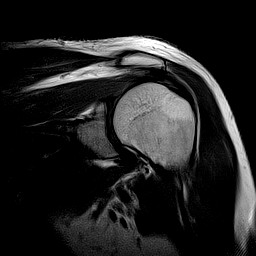
[im 11/17]
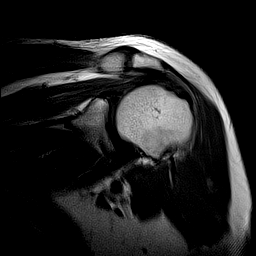
[im 14/17]
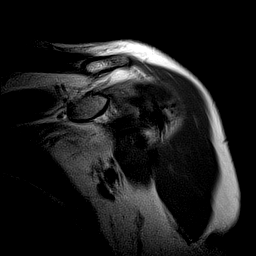
[im 17/17]
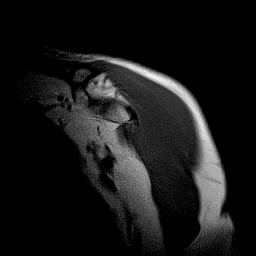

[Series 6: T1 · oblique · 4.0mm · 0.55mm/px · 9 of 20 slices shown]
[im 1/20]
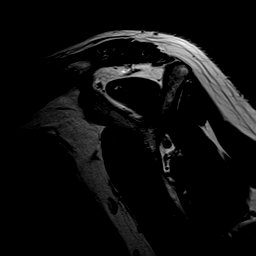
[im 3/20]
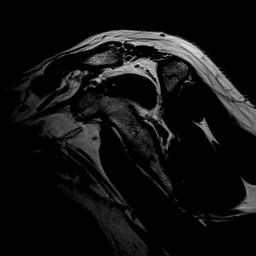
[im 5/20]
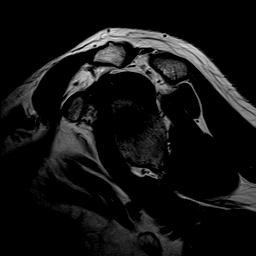
[im 8/20]
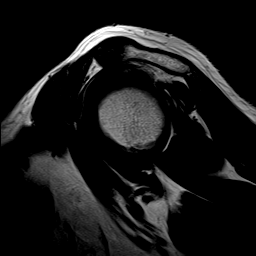
[im 10/20]
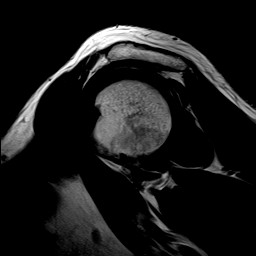
[im 12/20]
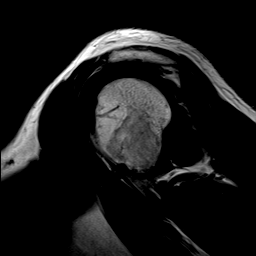
[im 15/20]
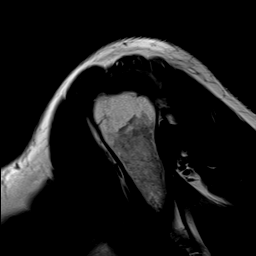
[im 17/20]
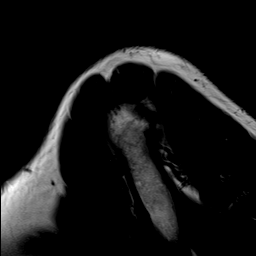
[im 20/20]
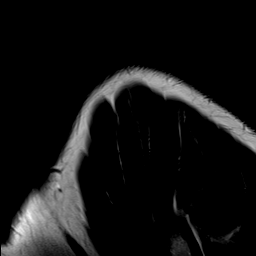

[Series 7: T2 fat-sat · oblique · 4.0mm · 0.55mm/px · 9 of 20 slices shown (2 of 2)]
[im 1/20]
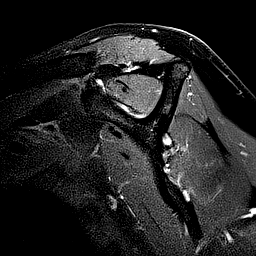
[im 3/20]
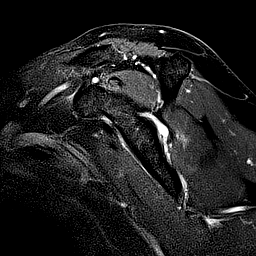
[im 5/20]
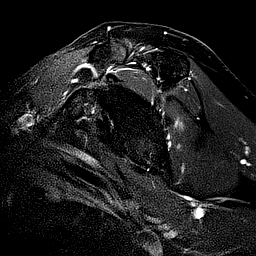
[im 8/20]
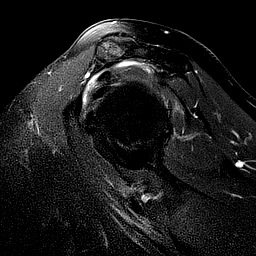
[im 10/20]
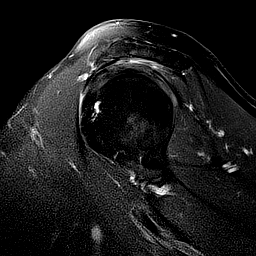
[im 12/20]
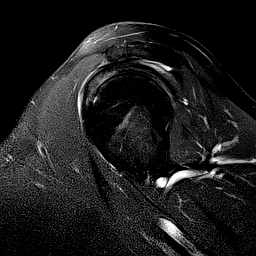
[im 15/20]
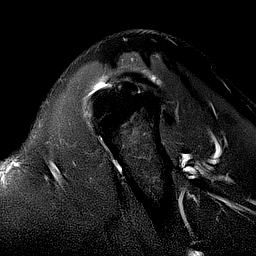
[im 17/20]
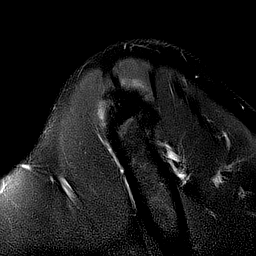
[im 20/20]
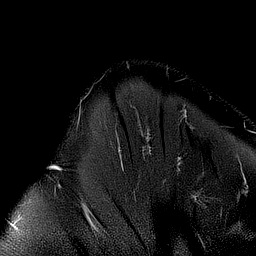

[40 of 40 positions shown; findings below may reference images not displayed]

FINDINGS: Rotator cuff: There is a small insertional tear of the anterior
leading edge of the supraspinatus tendon, best seen on coronal image
12 and sagittal image 15. This is probably full-thickness, although
there is no tendon retraction, and the defect measures approximately
1 cm anterior-posterior. There is mild subscapularis tendinosis. The
infraspinous and teres minor tendons appear normal.

Muscles:  No focal muscular atrophy or edema.

Biceps long head:  Intact and normally positioned.

Acromioclavicular Joint: The acromion is type 2. There are mild
acromioclavicular degenerative changes. A small amount of fluid is
present anteriorly in the subacromial -subdeltoid bursa.

Glenohumeral Joint: No significant shoulder joint effusion or
glenohumeral arthropathy. There is possible mild synovial thickening
in the rotator interval. There is mild cyst formation within the
lesser tuberosity without specific signs of coracoid impingement.

Labrum:  No evidence of labral tear.

Bones:  No significant extra-articular osseous findings.
IMPRESSION: 1. Small insertional tear of the supraspinatus tendon anteriorly,
likely full-thickness. Mild associated subacromial subdeltoid
bursitis. There is no tendon retraction or muscular atrophy.
2. Possible mild synovial thickening in the rotator interval as can
be seen with early adhesive capsulitis.
3. No evidence of labral or biceps tendon tear.

## 2015-07-23 DIAGNOSIS — H35711 Central serous chorioretinopathy, right eye: Secondary | ICD-10-CM | POA: Diagnosis not present

## 2015-08-22 NOTE — Patient Outreach (Signed)
[facility]) Care Management  08/22/2015  JAQUILL BISH [birth year] [id number]   I have discharged the patient from the Link to Wellness program for failure to engage with staff.    I have sent a discharge letter to his MD.    Gentry Fitz, RN, BA, [place], [place]:  [phone]  Fax:  [phone] E-mail: [email or web address] [address] [place], [place]  [address]

## 2015-09-14 DIAGNOSIS — I7300 Raynaud's syndrome without gangrene: Secondary | ICD-10-CM | POA: Diagnosis not present

## 2015-09-14 DIAGNOSIS — E784 Other hyperlipidemia: Secondary | ICD-10-CM | POA: Diagnosis not present

## 2015-09-14 DIAGNOSIS — E119 Type 2 diabetes mellitus without complications: Secondary | ICD-10-CM | POA: Diagnosis not present

## 2015-09-14 DIAGNOSIS — N529 Male erectile dysfunction, unspecified: Secondary | ICD-10-CM | POA: Diagnosis not present

## 2015-09-14 DIAGNOSIS — I10 Essential (primary) hypertension: Secondary | ICD-10-CM | POA: Diagnosis not present

## 2015-09-28 DIAGNOSIS — N529 Male erectile dysfunction, unspecified: Secondary | ICD-10-CM | POA: Diagnosis not present

## 2015-09-28 DIAGNOSIS — E119 Type 2 diabetes mellitus without complications: Secondary | ICD-10-CM | POA: Diagnosis not present

## 2015-09-28 DIAGNOSIS — E780 Pure hypercholesterolemia: Secondary | ICD-10-CM | POA: Diagnosis not present

## 2015-12-13 ENCOUNTER — Encounter: Payer: Self-pay | Admitting: *Deleted

## 2015-12-13 ENCOUNTER — Ambulatory Visit
Admission: EM | Admit: 2015-12-13 | Discharge: 2015-12-13 | Disposition: A | Discharge status: Home / Self Care | Payer: 59 | Attending: Family Medicine | Admitting: Family Medicine

## 2015-12-13 DIAGNOSIS — M545 Low back pain, unspecified: Secondary | ICD-10-CM

## 2015-12-13 DIAGNOSIS — S8002XA Contusion of left knee, initial encounter: Secondary | ICD-10-CM

## 2015-12-13 HISTORY — DX: Essential (primary) hypertension: I10

## 2015-12-13 MED ORDER — NAPROXEN 500 MG PO TABS: 500.0000 mg | ORAL_TABLET | Freq: Two times a day (BID) | ORAL | 0 refills | Status: DC | PRN

## 2015-12-13 MED ORDER — KETOROLAC TROMETHAMINE 60 MG/2ML IM SOLN
60.0000 mg | Freq: Once | INTRAMUSCULAR | Status: AC
  Administered 2015-12-13: 60 mg via INTRAMUSCULAR

## 2015-12-13 MED ORDER — CYCLOBENZAPRINE HCL 10 MG PO TABS: 10.0000 mg | ORAL_TABLET | Freq: Three times a day (TID) | ORAL | 0 refills | Status: DC | PRN

## 2015-12-13 MED ORDER — HYDROCODONE-ACETAMINOPHEN 5-325 MG PO TABS: 1.0000 | ORAL_TABLET | Freq: Four times a day (QID) | ORAL | 0 refills | Status: DC | PRN

## 2015-12-13 NOTE — ED Triage Notes (Signed)
Pt fell on wet floor at [employer] in [place], [place].

## 2015-12-13 NOTE — ED Provider Notes (Signed)
CSN: [id number]     Arrival date & time 12/13/15  0803 History   First MD Initiated Contact with Patient 12/13/15 [phone]     Chief Complaint  Patient presents with  . Back Pain   (Consider location/radiation/quality/duration/timing/severity/associated sxs/prior Treatment) 60 year old male presents with low back pain after a fall 4 days ago. He was at [employer] in [place] area on 12/4 around 6pm when he was on his way to the restroom in the restaurant and slipped on a wet floor. He "did a split" and landed on both of his knees and twisted his back. His left knee has been more painful from the accident. Now he is experiencing more right lower back pain that radiates to the left lower back and down his right leg. It is getting worse today. He denies any nausea, vomiting, loss of bladder or bowel control or numbness. He has taken Ibuprofen 800mg  last night and this morning with minimal relief. Has history of HTN, DM, hyperlipidemia and insomnia.    The history is provided by the patient.    Past Medical History:  Diagnosis Date  . Diabetes ([place])   . ED (erectile dysfunction)   . 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 Hypertension   . Seasonal allergies    Past Surgical History:  Procedure Laterality Date  . ROTATOR CUFF REPAIR Right 2009   Family History  Problem Relation Age of Onset  . Colon cancer Neg Hx   . Esophageal cancer Neg Hx   . Rectal cancer Neg Hx   . Stomach cancer Neg Hx   . Kidney cancer Neg Hx   . Kidney disease Neg Hx   . Prostate cancer Neg Hx   . Urolithiasis Neg Hx    Social History  Substance Use Topics  . Smoking status: Former Smoker    Quit date: 01/06/1982  . Smokeless tobacco: Never Used  . Alcohol use 7.2 - 10.8 oz/week    12 - 18 Cans of beer per week    Review of Systems  Constitutional: Negative for chills and fever.  Eyes: Negative for visual disturbance.  Respiratory: Negative for cough, chest tightness, shortness of  breath and wheezing.   Cardiovascular: Negative for chest pain.  Gastrointestinal: Negative for abdominal pain, diarrhea, nausea and vomiting.  Genitourinary: Negative for decreased urine volume, difficulty urinating, flank pain and hematuria.  Musculoskeletal: Positive for arthralgias, back pain and joint swelling. Negative for neck pain and neck stiffness.  Skin: Negative for rash.  Neurological: Negative for dizziness, syncope, weakness, light-headedness, numbness and headaches.    Allergies  Patient has no known allergies.  Home Medications   Prior to Admission medications   Medication Sig Start Date End Date Taking? Authorizing Provider  AMLODIPINE-ATORVASTATIN PO Take by mouth.   Yes Historical Provider, MD  aspirin EC 81 MG tablet Take 81 mg by mouth daily.   Yes Historical Provider, MD  atorvastatin (LIPITOR) 20 MG tablet Take 20 mg by mouth daily.   Yes Historical Provider, MD  ibuprofen (ADVIL,MOTRIN) 800 MG tablet Take 800 mg by mouth every 8 (eight) hours as needed.   Yes Historical Provider, MD  insulin detemir (LEVEMIR) 100 UNIT/ML injection Inject 45 Units into the skin every morning.    Yes Historical Provider, MD  lisinopril (PRINIVIL,ZESTRIL) 5 MG tablet Take 5 mg by mouth daily.   Yes Historical Provider, MD  metFORMIN (GLUCOPHAGE) 500 MG tablet Take by mouth 2 (two) times daily with  a meal.   Yes Historical Provider, MD  zolpidem (AMBIEN) 10 MG tablet Take 10 mg by mouth at bedtime as needed for sleep.   Yes Historical Provider, MD  cyclobenzaprine (FLEXERIL) 10 MG tablet Take 1 tablet (10 mg total) by mouth 3 (three) times daily as needed for muscle spasms. 12/13/15   Katy Apo, NP  HYDROcodone-acetaminophen (NORCO/VICODIN) 5-325 MG tablet Take 1-2 tablets by mouth every 6 (six) hours as needed for severe pain. 12/13/15   Katy Apo, NP  naproxen (NAPROSYN) 500 MG tablet Take 1 tablet (500 mg total) by mouth 2 (two) times daily as needed for moderate pain.  12/13/15   Katy Apo, NP   Meds Ordered and Administered this Visit   Medications  ketorolac (TORADOL) injection 60 mg (60 mg Intramuscular Given 12/13/15 0855)    BP (!) 127/94 (BP Location: Left Arm)   Pulse 60   Temp 97.9 F (36.6 C) (Oral)   Resp 16   Ht 5\' 11"  (1.803 m)   Wt 185 lb (83.9 kg)   SpO2 99%   BMI 25.80 kg/m  No data found.   Physical Exam  Constitutional: He is oriented to person, place, and time. He appears well-developed and well-nourished. No distress.  HENT:  Head: Normocephalic and atraumatic.  Neck: Normal range of motion. Neck supple.  Cardiovascular: Normal rate, regular rhythm and normal heart sounds.   Pulmonary/Chest: Effort normal and breath sounds normal. No respiratory distress.  Musculoskeletal: He exhibits tenderness. He exhibits no edema or deformity.       Left knee: He exhibits ecchymosis. He exhibits normal range of motion, no swelling, no effusion, no laceration, no erythema, normal alignment and normal patellar mobility. Tenderness found. Medial joint line tenderness noted.       Lumbar back: He exhibits decreased range of motion, tenderness, pain and spasm. He exhibits no swelling, no edema, no deformity and no laceration.       Back:  Decreased range of motion of lumbar area especially with rotation and extension of back. Tender to palpation right lower lumbar area. Muscle spasm present. No edema or swelling. No redness or bruising. + SLR. No numbness or neuro deficits noted.   Left knee has full range of motion. Slight tenderness and ecchymosis present along medial area. No swelling or edema. No redness. Good pulses distally. No neuro deficits noted.   Neurological: He is alert and oriented to person, place, and time. He has normal strength. No sensory deficit. He displays a negative Romberg sign.  Skin: Skin is warm, dry and intact. Capillary refill takes less than 2 seconds.  Psychiatric: He has a normal mood and affect. His  behavior is normal. Judgment and thought content normal.    Urgent Care Course   Clinical Course     Procedures (including critical care time)  Labs Review Labs Reviewed - No data to display  Imaging Review No results found.   Visual Acuity Review  Right Eye Distance:   Left Eye Distance:   Bilateral Distance:    Right Eye Near:   Left Eye Near:    Bilateral Near:         MDM   1. Acute right-sided low back pain without sciatica   2. Contusion of left knee, initial encounter    Gave Toradol 60mg  IM today to help with pain. May start Naproxen 500mg  twice a day as needed for pain and swelling. May take Flexeril 10mg  1/2 to 1 tablet 3  times a day as needed for muscle spasm- warned about side effects of drowsiness. If pain is severe, may take Vicodin 5/325mg  1-2 tablets every 6 hours as needed #12 no refill. [place] Controlled Substance Registry Reviewed- no active Rx in the past 90 days. Encouraged to apply warm moist heat to area for comfort. Follow-up with his primary care provider in 3 to 4 days if not improving.      Katy Apo, NP 12/13/15 2024

## 2015-12-13 NOTE — ED Triage Notes (Signed)
Pt slipped on wet floor. Now c/o low back pain, mostly on right side.

## 2015-12-13 NOTE — Discharge Instructions (Signed)
You were given a shot of Toradol today to help with pain. Recommend start Flexeril 10mg  1/2 to 1 tablet 3 times a day as needed for muscle spasms- will cause drowsiness. Recommend take Naproxen 500mg  twice a day as needed for pain and swelling. May also take Vicodin 1-2 tablets every 6 hours as needed for severe pain. Recommend follow-up with your primary care provider in 3 to 4 days if not improving.

## 2015-12-16 ENCOUNTER — Telehealth: Payer: Self-pay

## 2015-12-16 NOTE — Telephone Encounter (Signed)
Courtesy call back completed today after patient's visit at [facility]. Patient improved and will call back with any questions or concerns.  

## 2016-01-21 DIAGNOSIS — H47011 Ischemic optic neuropathy, right eye: Secondary | ICD-10-CM | POA: Diagnosis not present

## 2016-01-28 DIAGNOSIS — H40003 Preglaucoma, unspecified, bilateral: Secondary | ICD-10-CM | POA: Diagnosis not present

## 2016-01-31 ENCOUNTER — Ambulatory Visit: Payer: Self-pay | Admitting: Physician Assistant

## 2016-01-31 ENCOUNTER — Encounter: Payer: Self-pay | Admitting: Physician Assistant

## 2016-01-31 VITALS — BP 110/70 | HR 78 | Temp 98.5°F

## 2016-01-31 DIAGNOSIS — J209 Acute bronchitis, unspecified: Secondary | ICD-10-CM

## 2016-01-31 MED ORDER — AZITHROMYCIN 500 MG PO TABS: 500.0000 mg | ORAL_TABLET | Freq: Every day | ORAL | 0 refills | Status: DC

## 2016-01-31 MED ORDER — PSEUDOEPH-BROMPHEN-DM 30-2-10 MG/5ML PO SYRP: 5.0000 mL | ORAL_SOLUTION | Freq: Four times a day (QID) | ORAL | 0 refills | Status: DC | PRN

## 2016-01-31 NOTE — Progress Notes (Signed)
   Subjective:Cough    Patient ID: Curtis Singh, male    DOB: [birth year], 61 y.o.   MRN: [id number]  HPI Patient c/o 5 days of productive cough and nasal congestion. States states coughing spells is causing chest wall pain. Denies N/V/D. Patient has taken Flu shot for this season.   Review of Systems    Hypertension, Hyperlipidema, and Diabetes. Objective:   Physical Exam Bilateral maxillary guarding, edematous nasal turbinate, and thick yellowish rhinorrhea. Neck supple, Lungs with bilateral rales. Heart RRR.       Assessment & Plan:Bronchitis  Zithromax and Bromfed DM. Work note given.  Follow up 3-4 days if no improvement.

## 2016-04-22 DIAGNOSIS — L570 Actinic keratosis: Secondary | ICD-10-CM | POA: Diagnosis not present

## 2016-04-22 DIAGNOSIS — L718 Other rosacea: Secondary | ICD-10-CM | POA: Diagnosis not present

## 2016-04-23 DIAGNOSIS — L570 Actinic keratosis: Secondary | ICD-10-CM | POA: Diagnosis not present

## 2016-06-03 ENCOUNTER — Encounter: Payer: Self-pay | Admitting: Physician Assistant

## 2016-06-03 ENCOUNTER — Ambulatory Visit: Payer: Self-pay | Admitting: Physician Assistant

## 2016-06-03 VITALS — BP 152/80 | HR 52 | Temp 98.5°F

## 2016-06-03 DIAGNOSIS — H1032 Unspecified acute conjunctivitis, left eye: Secondary | ICD-10-CM

## 2016-06-03 MED ORDER — TOBRAMYCIN 0.3 % OP SOLN: 2.0000 [drp] | OPHTHALMIC | 0 refills | Status: DC

## 2016-06-03 NOTE — Progress Notes (Signed)
S:  C/o left eye being irritated, matted, sx started 2d ago, denies, cough, congestion, fever, chills, v/d; no injury to eye, no drainage during the day; remainder ros neg  O: vitals wnl, nad, perrl eomi, left eye with injected conjunctiva, no drainage or matting noted at this time, tms clear, nasal mucosa inflamed, throat wnl, neck supple no lymph, lungs c t a, cv rrr  A:  Acute conjunctivitis  P: tobramycin opth gtts, return if not better in 3 - 5d, if worsening return earlier or see eye doctor, no work until eye is no longer red or draining

## 2016-07-03 DIAGNOSIS — E784 Other hyperlipidemia: Secondary | ICD-10-CM | POA: Diagnosis not present

## 2016-07-03 DIAGNOSIS — I10 Essential (primary) hypertension: Secondary | ICD-10-CM | POA: Diagnosis not present

## 2016-07-03 DIAGNOSIS — E119 Type 2 diabetes mellitus without complications: Secondary | ICD-10-CM | POA: Diagnosis not present

## 2016-08-12 DIAGNOSIS — H2513 Age-related nuclear cataract, bilateral: Secondary | ICD-10-CM | POA: Diagnosis not present

## 2016-08-22 DIAGNOSIS — H2513 Age-related nuclear cataract, bilateral: Secondary | ICD-10-CM | POA: Diagnosis not present

## 2016-09-01 NOTE — Discharge Instructions (Signed)

## 2016-09-03 ENCOUNTER — Ambulatory Visit: Payer: 59 | Admitting: Anesthesiology

## 2016-09-03 ENCOUNTER — Ambulatory Visit
Admission: RE | Admit: 2016-09-03 | Discharge: 2016-09-03 | Disposition: A | Discharge status: Home / Self Care | Payer: 59 | Source: Ambulatory Visit | Attending: Ophthalmology | Admitting: Ophthalmology

## 2016-09-03 ENCOUNTER — Encounter
Admission: RE | Disposition: A | Discharge status: Home / Self Care | Payer: Self-pay | Source: Ambulatory Visit | Attending: Ophthalmology

## 2016-09-03 DIAGNOSIS — H2511 Age-related nuclear cataract, right eye: Secondary | ICD-10-CM | POA: Diagnosis not present

## 2016-09-03 DIAGNOSIS — E119 Type 2 diabetes mellitus without complications: Secondary | ICD-10-CM | POA: Insufficient documentation

## 2016-09-03 DIAGNOSIS — E7800 Pure hypercholesterolemia, unspecified: Secondary | ICD-10-CM | POA: Insufficient documentation

## 2016-09-03 DIAGNOSIS — Z79899 Other long term (current) drug therapy: Secondary | ICD-10-CM | POA: Diagnosis not present

## 2016-09-03 DIAGNOSIS — Z9889 Other specified postprocedural states: Secondary | ICD-10-CM | POA: Insufficient documentation

## 2016-09-03 DIAGNOSIS — Z7982 Long term (current) use of aspirin: Secondary | ICD-10-CM | POA: Diagnosis not present

## 2016-09-03 DIAGNOSIS — I10 Essential (primary) hypertension: Secondary | ICD-10-CM | POA: Diagnosis not present

## 2016-09-03 DIAGNOSIS — Z87891 Personal history of nicotine dependence: Secondary | ICD-10-CM | POA: Diagnosis not present

## 2016-09-03 DIAGNOSIS — Z794 Long term (current) use of insulin: Secondary | ICD-10-CM | POA: Diagnosis not present

## 2016-09-03 HISTORY — PX: CATARACT EXTRACTION W/PHACO: SHX586

## 2016-09-03 LAB — GLUCOSE, CAPILLARY
Glucose-Capillary: 153 mg/dL — ABNORMAL HIGH (ref 65–99)
Glucose-Capillary: 191 mg/dL — ABNORMAL HIGH (ref 65–99)

## 2016-09-03 SURGERY — PHACOEMULSIFICATION, CATARACT, WITH IOL INSERTION
Anesthesia: Monitor Anesthesia Care | Laterality: Right | Wound class: Clean

## 2016-09-03 MED ORDER — LIDOCAINE HCL (PF) 2 % IJ SOLN
INTRAMUSCULAR | Status: DC | PRN
  Administered 2016-09-03: 1 mL via INTRADERMAL

## 2016-09-03 MED ORDER — LACTATED RINGERS IV SOLN: INTRAVENOUS | Status: DC

## 2016-09-03 MED ORDER — ONDANSETRON HCL 4 MG/2ML IJ SOLN: 4.0000 mg | Freq: Once | INTRAMUSCULAR | Status: DC | PRN

## 2016-09-03 MED ORDER — EPINEPHRINE PF 1 MG/ML IJ SOLN
INTRAOCULAR | Status: DC | PRN
  Administered 2016-09-03: 68 mL via OPHTHALMIC

## 2016-09-03 MED ORDER — ACETAMINOPHEN 160 MG/5ML PO SOLN: 325.0000 mg | ORAL | Status: DC | PRN

## 2016-09-03 MED ORDER — ACETAMINOPHEN 325 MG PO TABS: 650.0000 mg | ORAL_TABLET | Freq: Once | ORAL | Status: DC | PRN

## 2016-09-03 MED ORDER — MIDAZOLAM HCL 2 MG/2ML IJ SOLN
INTRAMUSCULAR | Status: DC | PRN
  Administered 2016-09-03: 2 mg via INTRAVENOUS

## 2016-09-03 MED ORDER — ARMC OPHTHALMIC DILATING DROPS
1.0000 "application " | OPHTHALMIC | Status: DC | PRN
  Administered 2016-09-03 (×3): 1 via OPHTHALMIC

## 2016-09-03 MED ORDER — FENTANYL CITRATE (PF) 100 MCG/2ML IJ SOLN
INTRAMUSCULAR | Status: DC | PRN
  Administered 2016-09-03: 100 ug via INTRAVENOUS

## 2016-09-03 MED ORDER — CEFUROXIME OPHTHALMIC INJECTION 1 MG/0.1 ML
INJECTION | OPHTHALMIC | Status: DC | PRN
  Administered 2016-09-03: 0.1 mL via INTRACAMERAL

## 2016-09-03 MED ORDER — MOXIFLOXACIN HCL 0.5 % OP SOLN
1.0000 [drp] | OPHTHALMIC | Status: DC | PRN
  Administered 2016-09-03 (×3): 1 [drp] via OPHTHALMIC

## 2016-09-03 MED ORDER — NA HYALUR & NA CHOND-NA HYALUR 0.4-0.35 ML IO KIT
PACK | INTRAOCULAR | Status: DC | PRN
  Administered 2016-09-03: 1 mL via INTRAOCULAR

## 2016-09-03 SURGICAL SUPPLY — 25 items

## 2016-09-03 NOTE — Anesthesia Preprocedure Evaluation (Signed)
Anesthesia Evaluation  Patient identified by MRN, date of birth, ID band Patient awake    Reviewed: Allergy & Precautions, NPO status , Patient's Chart, lab work & pertinent test results  History of Anesthesia Complications Negative for: history of anesthetic complications  Airway Mallampati: II  TM Distance: >3 FB Neck ROM: Full    Dental no notable dental hx.    Pulmonary former smoker (quit 1984),    Pulmonary exam normal breath sounds clear to auscultation       Cardiovascular Exercise Tolerance: Good hypertension, Normal cardiovascular exam Rhythm:Regular Rate:Normal     Neuro/Psych negative neurological ROS     GI/Hepatic GERD  ,  Endo/Other  diabetes, Type 2, Insulin Dependent  Renal/GU negative Renal ROS     Musculoskeletal   Abdominal   Peds  Hematology negative hematology ROS (+)   Anesthesia Other Findings   Reproductive/Obstetrics                             Anesthesia Physical Anesthesia Plan  ASA: II  Anesthesia Plan: MAC   Post-op Pain Management:    Induction: Intravenous  PONV Risk Score and Plan:   Airway Management Planned:   Additional Equipment:   Intra-op Plan:   Post-operative Plan:   Informed Consent: I have reviewed the patients History and Physical, chart, labs and discussed the procedure including the risks, benefits and alternatives for the proposed anesthesia with the patient or authorized representative who has indicated his/her understanding and acceptance.     Plan Discussed with: CRNA  Anesthesia Plan Comments:         Anesthesia Quick Evaluation

## 2016-09-03 NOTE — Op Note (Signed)
LOCATION:  [place]   PREOPERATIVE DIAGNOSIS:    Nuclear sclerotic cataract right eye. H25.11   POSTOPERATIVE DIAGNOSIS:  Nuclear sclerotic cataract right eye.     PROCEDURE:  Phacoemusification with posterior chamber intraocular lens placement of the right eye   LENS:   Implant Name Type Inv. Item Serial No. Manufacturer Lot No. LRB No. Used  LENS IOL DIOP 20.0 - [id number] Intraocular Lens LENS IOL DIOP 20.0 [id number] AMO   Right 1        ULTRASOUND TIME: 14 % of 1 minutes, 20 seconds.  CDE 11.6   SURGEON:  Wyonia Hough, MD   ANESTHESIA:  Topical with tetracaine drops and 2% Xylocaine jelly, augmented with 1% preservative-free intracameral lidocaine.    COMPLICATIONS:  None.   DESCRIPTION OF PROCEDURE:  The patient was identified in the holding room and transported to the operating room and placed in the supine position under the operating microscope.  The right eye was identified as the operative eye and it was prepped and draped in the usual sterile ophthalmic fashion.   A 1 millimeter clear-corneal paracentesis was made at the 12:00 position.  0.5 ml of preservative-free 1% lidocaine was injected into the anterior chamber. The anterior chamber was filled with Viscoat viscoelastic.  A 2.4 millimeter keratome was used to make a near-clear corneal incision at the 9:00 position.  A curvilinear capsulorrhexis was made with a cystotome and capsulorrhexis forceps.  Balanced salt solution was used to hydrodissect and hydrodelineate the nucleus.   Phacoemulsification was then used in stop and chop fashion to remove the lens nucleus and epinucleus.  The remaining cortex was then removed using the irrigation and aspiration handpiece. Provisc was then placed into the capsular bag to distend it for lens placement.  A lens was then injected into the capsular bag.  The remaining viscoelastic was aspirated.   Wounds were hydrated with balanced salt solution.  The anterior  chamber was inflated to a physiologic pressure with balanced salt solution.  No wound leaks were noted. Cefuroxime 0.1 ml of a 10mg /ml solution was injected into the anterior chamber for a dose of 1 mg of intracameral antibiotic at the completion of the case.   Timolol and Brimonidine drops were applied to the eye.  The patient was taken to the recovery room in stable condition without complications of anesthesia or surgery.   Lauraann Missey 09/03/2016, 8:31 AM

## 2016-09-03 NOTE — Transfer of Care (Signed)
Immediate Anesthesia Transfer of Care Note  Patient: Curtis Singh  Procedure(s) Performed: Procedure(s) with comments: CATARACT EXTRACTION PHACO AND INTRAOCULAR LENS PLACEMENT (IOC) RIGHT DIABETIC (Right) - DIABETIC  Patient Location: PACU  Anesthesia Type: MAC  Level of Consciousness: awake, alert  and patient cooperative  Airway and Oxygen Therapy: Patient Spontanous Breathing and Patient connected to supplemental oxygen  Post-op Assessment: Post-op Vital signs reviewed, Patient's Cardiovascular Status Stable, Respiratory Function Stable, Patent Airway and No signs of Nausea or vomiting  Post-op Vital Signs: Reviewed and stable  Complications: No apparent anesthesia complications

## 2016-09-03 NOTE — H&P (Signed)
The History and Physical notes are on paper, have been signed, and are to be scanned. The patient remains stable and unchanged from the H&P.   Previous H&P reviewed, patient examined, and there are no changes.  Curtis Singh 09/03/2016 7:45 AM

## 2016-09-03 NOTE — Anesthesia Postprocedure Evaluation (Signed)
Anesthesia Post Note  Patient: Curtis Singh  Procedure(s) Performed: Procedure(s) (LRB): CATARACT EXTRACTION PHACO AND INTRAOCULAR LENS PLACEMENT (IOC) RIGHT DIABETIC (Right)  Patient location during evaluation: PACU Anesthesia Type: MAC Level of consciousness: awake and alert, oriented and patient cooperative Pain management: pain level controlled Vital Signs Assessment: post-procedure vital signs reviewed and stable Respiratory status: spontaneous breathing, nonlabored ventilation and respiratory function stable Cardiovascular status: blood pressure returned to baseline and stable Postop Assessment: adequate PO intake Anesthetic complications: no    Darrin Nipper

## 2016-09-04 ENCOUNTER — Encounter: Payer: Self-pay | Admitting: Ophthalmology

## 2016-09-21 NOTE — Progress Notes (Signed)
09/22/2016 4:51 PM   Curtis Singh 1955/06/07 [id number]  Referring provider: Cletis Athens, MD [address] [place], [place] [address]  No chief complaint on file.   HPI: Patient is a 61 year old Caucasian male with ED who presents today requesting a refill on Trimix.  It has been over one year since he has been seen.  Erectile dysfunction His SHIM score is 7, which is severe ED.   His previous SHIM score was 7.  He has been having difficulty with erections for over one year.   His major complaint is the confidence to achieve an erection.  His libido is preserved.   His risk factors for ED are diabetes, hyperlipidemia, BPH and age.  He denies any painful erections or curvatures with his erections.   He has tried PDE5-inhibitors in the past with variable effectiveness.  He is currently using Trimix with satisfactory results.     SHIM    Row Name 09/22/16 1601         SHIM: Over the last 6 months:   How do you rate your confidence that you could get and keep an erection? Very Low     When you had erections with sexual stimulation, how often were your erections hard enough for penetration (entering your partner)? A Few Times (much less than half the time)     During sexual intercourse, how often were you able to maintain your erection after you had penetrated (entered) your partner? Almost Never or Never     During sexual intercourse, how difficult was it to maintain your erection to completion of intercourse? Very Difficult     When you attempted sexual intercourse, how often was it satisfactory for you? A Few Times (much less than half the time)       SHIM Total Score   SHIM 8        Score: 1-7 Severe ED 8-11 Moderate ED 12-16 Mild-Moderate ED 17-21 Mild ED 22-25 No ED   BPH WITH LUTS His IPSS score today is 5, which is mild lower urinary tract symptomatology. He is mostly satisfied with his quality life due to his urinary symptoms.  His previous  I PSS score was  5/1.  He denies any dysuria, hematuria or suprapubic pain.  He also denies any recent fevers, chills, nausea or vomiting.  He does not have a family history of PCa.     IPSS    Row Name 09/22/16 1600         International Prostate Symptom Score   How often have you had the sensation of not emptying your bladder? Not at All     How often have you had to urinate less than every two hours? Not at All     How often have you found you stopped and started again several times when you urinated? Less than 1 in 5 times     How often have you found it difficult to postpone urination? Not at All     How often have you had a weak urinary stream? Less than half the time     How often have you had to strain to start urination? Less than 1 in 5 times     How many times did you typically get up at night to urinate? 1 Time     Total IPSS Score 5       Quality of Life due to urinary symptoms   If you were to spend  the rest of your life with your urinary condition just the way it is now how would you feel about that? Mostly Satisfied        Score:  1-7 Mild 8-19 Moderate 20-35 Severe   PMH: Past Medical History:  Diagnosis Date  . Diabetes ([place])   . ED (erectile dysfunction)   . 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 Hypertension   . Seasonal allergies     Surgical History: Past Surgical History:  Procedure Laterality Date  . CATARACT EXTRACTION W/PHACO Right 09/03/2016   Procedure: CATARACT EXTRACTION PHACO AND INTRAOCULAR LENS PLACEMENT ([place]) RIGHT DIABETIC;  Surgeon: Leandrew Koyanagi, MD;  Location: [facility];  Service: Ophthalmology;  Laterality: Right;  DIABETIC  . ROTATOR CUFF REPAIR Right 2009    Home Medications:  Allergies as of 09/22/2016   No Known Allergies     Medication List       Accurate as of 09/22/16  4:51 PM. Always use your most recent med list.          aspirin EC 81 MG tablet Take 81 mg by mouth daily.   ibuprofen 800 MG  tablet Commonly known as:  ADVIL,MOTRIN Take 800 mg by mouth every 8 (eight) hours as needed.   insulin detemir 100 UNIT/ML injection Commonly known as:  LEVEMIR Inject 12 Units into the skin every morning.   lisinopril 5 MG tablet Commonly known as:  PRINIVIL,ZESTRIL Take 1 tablet by mouth daily.   metFORMIN 500 MG tablet Commonly known as:  GLUCOPHAGE Take by mouth 2 (two) times daily with a meal.   zolpidem 10 MG tablet Commonly known as:  AMBIEN Take 10 mg by mouth at bedtime as needed for sleep.            Discharge Care Instructions        Start     Ordered   09/22/16 0000  PSA     09/22/16 1558   09/22/16 0000  TSH     09/22/16 1558   09/22/16 0000  Testosterone     09/22/16 1558      Allergies: No Known Allergies  Family History: Family History  Problem Relation Age of Onset  . Colon cancer Neg Hx   . Esophageal cancer Neg Hx   . Rectal cancer Neg Hx   . Stomach cancer Neg Hx   . Kidney cancer Neg Hx   . Kidney disease Neg Hx   . Prostate cancer Neg Hx   . Urolithiasis Neg Hx     Social History:  reports that he quit smoking about 34 years ago. He has never used smokeless tobacco. He reports that he drinks about 7.2 - 10.8 oz of alcohol per week . He reports that he does not use drugs.  ROS: UROLOGY Frequent Urination?: No Hard to postpone urination?: No Burning/pain with urination?: No Get up at night to urinate?: No Leakage of urine?: No Urine stream starts and stops?: Yes Trouble starting stream?: No Do you have to strain to urinate?: Yes Blood in urine?: No Urinary tract infection?: No Sexually transmitted disease?: No Injury to kidneys or bladder?: No Painful intercourse?: No Weak stream?: No Erection problems?: Yes Penile pain?: No  Gastrointestinal Nausea?: No Vomiting?: No Indigestion/heartburn?: No Diarrhea?: No Constipation?: No  Constitutional Fever: No Night sweats?: No Weight loss?: No Fatigue?:  No  Skin Skin rash/lesions?: No Itching?: No  Eyes Blurred vision?: No Double vision?: No  Ears/Nose/Throat Sore throat?: No  Sinus problems?: No  Hematologic/Lymphatic Swollen glands?: No Easy bruising?: No  Cardiovascular Leg swelling?: No Chest pain?: No  Respiratory Cough?: No Shortness of breath?: No  Endocrine Excessive thirst?: No  Musculoskeletal Back pain?: No Joint pain?: No  Neurological Headaches?: No Dizziness?: No  Psychologic Depression?: No Anxiety?: No  Physical Exam: BP 137/78 (BP Location: Left Arm, Patient Position: Sitting, Cuff Size: Normal)   Pulse 73   Ht 5\' 11"  (1.803 m)   Wt 197 lb 9.6 oz (89.6 kg)   BMI 27.56 kg/m   Constitutional: Well nourished. Alert and oriented, No acute distress. HEENT: [place] AT, moist mucus membranes. Trachea midline, no masses. Cardiovascular: No clubbing, cyanosis, or edema. Respiratory: Normal respiratory effort, no increased work of breathing. GI: Abdomen is soft, non tender, non distended, no abdominal masses. Liver and spleen not palpable.  No hernias appreciated.  Stool sample for occult testing is not indicated.   GU: No CVA tenderness.  No bladder fullness or masses.  Patient with circumcised phallus.   Urethral meatus is patent.  No penile discharge. No penile lesions or rashes. Scrotum without lesions, cysts, rashes and/or edema.  Testicles are located scrotally bilaterally. No masses are appreciated in the testicles. Left and right epididymis are normal. Rectal: Patient with  normal sphincter tone. Anus and perineum without scarring or rashes. No rectal masses are appreciated. Prostate is approximately 50  grams, no nodules are appreciated. Seminal vesicles are normal. Skin: No rashes, bruises or suspicious lesions. Lymph: No cervical or inguinal adenopathy. Neurologic: Grossly intact, no focal deficits, moving all 4 extremities. Psychiatric: Normal mood and affect.  Laboratory Data: PSA History    0.5 ng/mL in 02/2015  Results for orders placed or performed during the hospital encounter of 09/03/16  Glucose, capillary  Result Value Ref Range   Glucose-Capillary 153 (H) 65 - 99 mg/dL  Glucose, capillary  Result Value Ref Range   Glucose-Capillary 191 (H) 65 - 99 mg/dL   I have reviewed the labs  Assessment & Plan:    1. Erectile dysfunction  - SHIM score is 7, it is stable  - Continue Trimix 10 mcg syringes - refill sent to pharmacy  - we will obtain a serum testosterone level and a TSH at this time; if it is abnormal we will need to repeat the study for confirmation  - RTC in 12 months for repeat SHIM score and exam   2.BPH with LUTS:   IPSS score is 5/2.  We will continue to monitor. He will return in 1 year for I PSS score, exam and PSA.  - PSA   Return for pending labs.  These notes generated with voice recognition software. I apologize for typographical errors.  Zara Council, [facility] [address] [place], [place] [address] [phone]

## 2016-09-22 ENCOUNTER — Telehealth: Payer: Self-pay | Admitting: Urology

## 2016-09-22 ENCOUNTER — Ambulatory Visit: Payer: 59 | Admitting: Urology

## 2016-09-22 VITALS — BP 137/78 | HR 73 | Ht 71.0 in | Wt 197.6 lb

## 2016-09-22 DIAGNOSIS — N138 Other obstructive and reflux uropathy: Secondary | ICD-10-CM | POA: Diagnosis not present

## 2016-09-22 DIAGNOSIS — N529 Male erectile dysfunction, unspecified: Secondary | ICD-10-CM | POA: Diagnosis not present

## 2016-09-22 DIAGNOSIS — N401 Enlarged prostate with lower urinary tract symptoms: Secondary | ICD-10-CM | POA: Diagnosis not present

## 2016-09-22 NOTE — Telephone Encounter (Signed)
Would you call in a refill for Trimix syringes for Curtis Singh? 10 mcg syringes.

## 2016-09-23 ENCOUNTER — Telehealth: Payer: Self-pay

## 2016-09-23 ENCOUNTER — Encounter: Payer: Self-pay | Admitting: Urology

## 2016-09-23 DIAGNOSIS — E291 Testicular hypofunction: Secondary | ICD-10-CM

## 2016-09-23 LAB — TESTOSTERONE: Testosterone: 240 ng/dL — ABNORMAL LOW (ref 264–916)

## 2016-09-23 LAB — PSA: Prostate Specific Ag, Serum: 0.4 ng/mL (ref 0.0–4.0)

## 2016-09-23 LAB — TSH: TSH: 1.16 u[IU]/mL (ref 0.450–4.500)

## 2016-09-23 NOTE — Telephone Encounter (Signed)
Called into [facility] 

## 2016-09-23 NOTE — Telephone Encounter (Signed)
-----   Message from Nori Riis, PA-C sent at 09/23/2016  8:29 AM EDT ----- Please let Mr. Biebel know that his testosterone is low, but we will need a specimen drawn before 9 AM.  He works in [place], so he would like to go to the [facility] in [place].  He also needs his Trimix script called to [facility], he has 6 syringes filled with 10 mcg of medication with a years refill.

## 2016-09-23 NOTE — Telephone Encounter (Signed)
LMOM

## 2016-09-23 NOTE — Telephone Encounter (Signed)
Spoke with pt in reference to testosterone levels and needing labs. Also made pt aware trim[facility] was [facility] to [facility]. Pt voiced understanding of whole conversation. Lab orders placed.

## 2016-09-25 ENCOUNTER — Other Ambulatory Visit
Admission: RE | Admit: 2016-09-25 | Discharge: 2016-09-25 | Disposition: A | Discharge status: Home / Self Care | Payer: 59 | Source: Ambulatory Visit | Attending: Urology | Admitting: Urology

## 2016-09-25 DIAGNOSIS — E291 Testicular hypofunction: Secondary | ICD-10-CM | POA: Insufficient documentation

## 2016-09-26 ENCOUNTER — Telehealth: Payer: Self-pay

## 2016-09-26 LAB — TESTOSTERONE: Testosterone: 258 ng/dL — ABNORMAL LOW (ref 264–916)

## 2016-09-26 NOTE — Telephone Encounter (Signed)
-----   Message from Nori Riis, PA-C sent at 09/26/2016  7:59 AM EDT ----- Please let Curtis Singh know that his testosterone is low.  We will need one more testosterone before 9 AM to confirm.

## 2016-09-26 NOTE — Telephone Encounter (Signed)
Called patient.  No answer.

## 2016-09-30 ENCOUNTER — Telehealth: Payer: Self-pay

## 2016-09-30 DIAGNOSIS — E291 Testicular hypofunction: Secondary | ICD-10-CM

## 2016-09-30 NOTE — Telephone Encounter (Signed)
-----   Message from Nori Riis, PA-C sent at 09/26/2016  7:59 AM EDT ----- Please let Curtis Singh know that his testosterone is low.  We will need one more testosterone before 9 AM to confirm.

## 2016-09-30 NOTE — Telephone Encounter (Signed)
Spoke with pt in reference to lab results. Made pt aware will need one more for confirmation. Pt voiced understanding. Orders placed for pt to have labs drawn in [place].

## 2016-10-01 ENCOUNTER — Other Ambulatory Visit: Payer: Self-pay | Admitting: Ophthalmology

## 2016-10-01 ENCOUNTER — Other Ambulatory Visit
Admission: RE | Admit: 2016-10-01 | Discharge: 2016-10-01 | Disposition: A | Discharge status: Home / Self Care | Payer: 59 | Source: Ambulatory Visit | Attending: Urology | Admitting: Urology

## 2016-10-01 DIAGNOSIS — E291 Testicular hypofunction: Secondary | ICD-10-CM | POA: Diagnosis not present

## 2016-10-01 MED ORDER — PREDNISOLONE ACETATE 1 % OP SUSP: 1.0000 [drp] | Freq: Two times a day (BID) | OPHTHALMIC | 1 refills | Status: DC

## 2016-10-02 ENCOUNTER — Telehealth: Payer: Self-pay

## 2016-10-02 LAB — TESTOSTERONE: TESTOSTERONE: 417 ng/dL (ref 264–916)

## 2016-10-02 NOTE — Telephone Encounter (Signed)
LMOM- testosterone normal.

## 2016-10-02 NOTE — Telephone Encounter (Signed)
-----   Message from Nori Riis, PA-C sent at 10/02/2016  8:22 AM EDT ----- Please let Dominica Severin know that his testosterone level is normal.

## 2016-10-10 ENCOUNTER — Telehealth: Payer: Self-pay

## 2016-10-10 DIAGNOSIS — E291 Testicular hypofunction: Secondary | ICD-10-CM

## 2016-10-10 NOTE — Telephone Encounter (Signed)
That is fine if he wants to have it drawn again before 9 AM.

## 2016-10-10 NOTE — Telephone Encounter (Signed)
Pt called with questions in reference to his testosterone levels. Pt inquired about having another testosterone level drawn before 9. Please advise.

## 2016-10-13 NOTE — Telephone Encounter (Signed)
Spoke with pt and made aware can have labs again before 9. Pt voiced understanding. Orders placed.

## 2016-10-28 ENCOUNTER — Other Ambulatory Visit
Admission: RE | Admit: 2016-10-28 | Discharge: 2016-10-28 | Disposition: A | Discharge status: Home / Self Care | Payer: 59 | Source: Ambulatory Visit | Attending: Urology | Admitting: Urology

## 2016-10-28 DIAGNOSIS — E291 Testicular hypofunction: Secondary | ICD-10-CM | POA: Insufficient documentation

## 2016-10-29 LAB — TESTOSTERONE: Testosterone: 397 ng/dL (ref 264–916)

## 2016-10-30 ENCOUNTER — Telehealth: Payer: Self-pay | Admitting: *Deleted

## 2016-10-30 NOTE — Telephone Encounter (Signed)
-----   Message from Nori Riis, PA-C sent at 10/29/2016  8:05 AM EDT ----- Please let Curtis Singh know that his testosterone is still normal.

## 2016-10-30 NOTE — Telephone Encounter (Signed)
Spoke with patient and gave results. 

## 2016-11-07 IMAGING — MR MR HEAD WO/W CM
9 of 16 series · 27 of 48 positions shown · IV contrast (19 ML MULTIHANCE)
Comparison: None.

CLINICAL DATA: 58-year-old male with visual changes for 5 days,
progressing. Optic disc hemorrhage and right optic nerve edema.
Initial encounter.

EXAM:
MRI HEAD AND ORBITS WITHOUT AND WITH CONTRAST
TECHNIQUE: Multiplanar, multiecho pulse sequences of the brain and surrounding
structures were obtained without and with intravenous contrast.
Multiplanar, multiecho pulse sequences of the orbits and surrounding
structures were obtained including fat saturation techniques, before
and after intravenous contrast administration.
CONTRAST:  19mL MULTIHANCE GADOBENATE DIMEGLUMINE 529 MG/ML IV SOLN

[Series 5: T2 · axial · 5.0mm · 0.45mm/px · z∈[-92,+77]mm · 2 of 27 slices shown (1 of 2)]
[im 1/27]
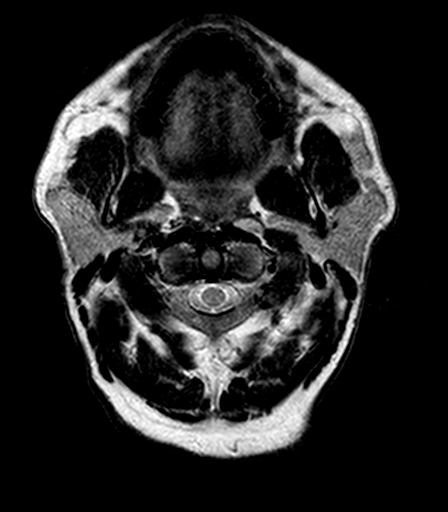
[im 27/27]
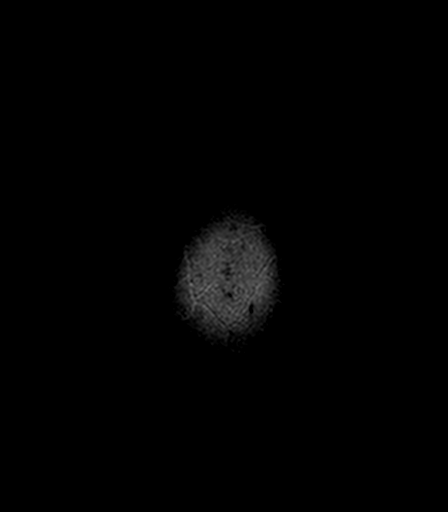

[Series 6: FLAIR · axial · 5.0mm · 0.90mm/px · z∈[-92,+77]mm · 2 of 27 slices shown]
[im 1/27]
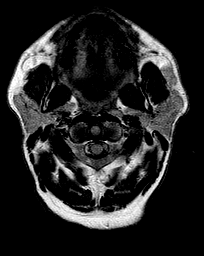
[im 27/27]
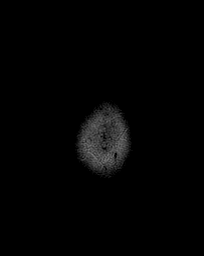

[Series 7: DWI · axial · 4.0mm · 0.94mm/px · z∈[-87,+77]mm · 4 of 42 slices shown]
[im 1/42]
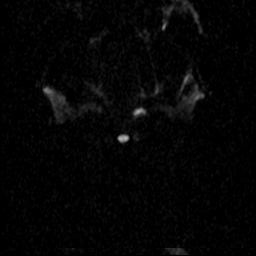
[im 14/42]
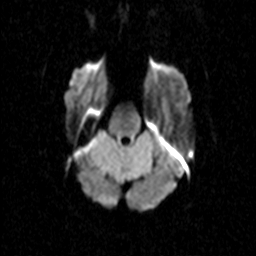
[im 28/42]
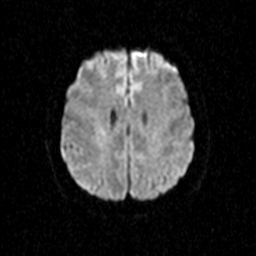
[im 42/42]
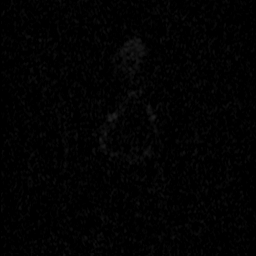

[Series 8: ADC · axial · 4.0mm · 0.94mm/px · z∈[-87,+77]mm · 4 of 42 slices shown]
[im 1/42]
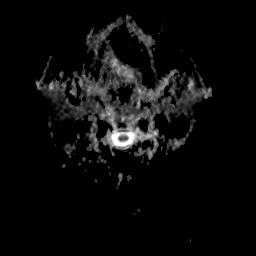
[im 14/42]
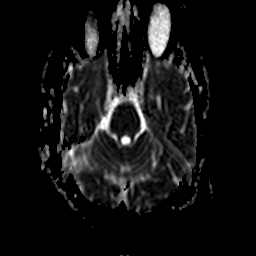
[im 28/42]
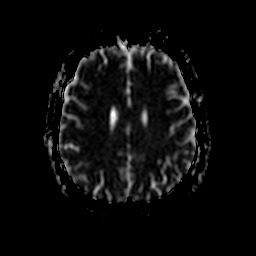
[im 42/42]
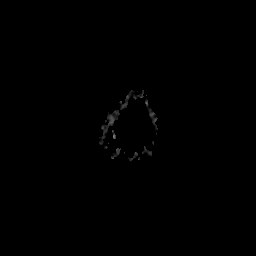

[Series 9: T2 · axial · 5.0mm · 0.45mm/px · z∈[-92,+77]mm · 3 of 27 slices shown (2 of 2)]
[im 1/27]
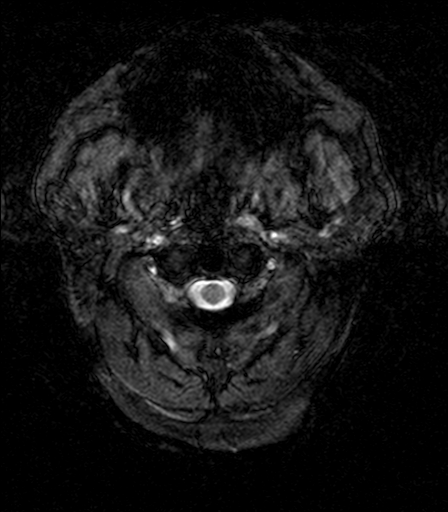
[im 14/27]
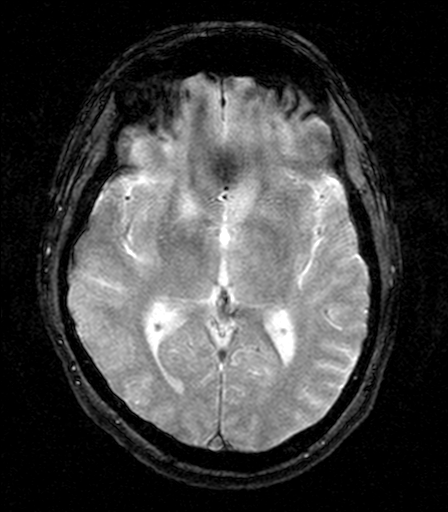
[im 27/27]
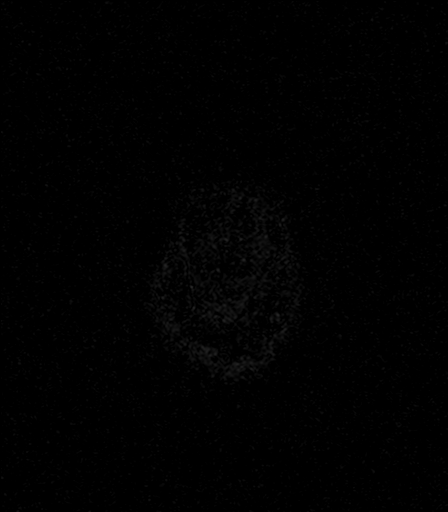

[Series 11: T2 fat-sat · axial · 3.0mm · 0.35mm/px · z∈[-43,+9]mm · 2 of 17 slices shown (1 of 2)]
[im 1/17]
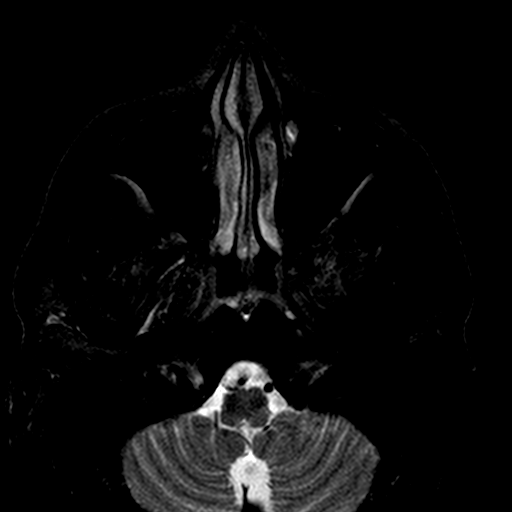
[im 17/17]
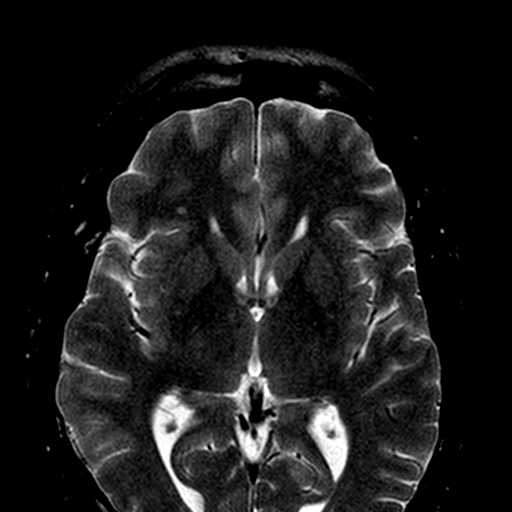

[Series 12: T2 fat-sat · coronal · 3.0mm · 0.35mm/px · 2 of 30 slices shown (2 of 2)]
[im 1/30]
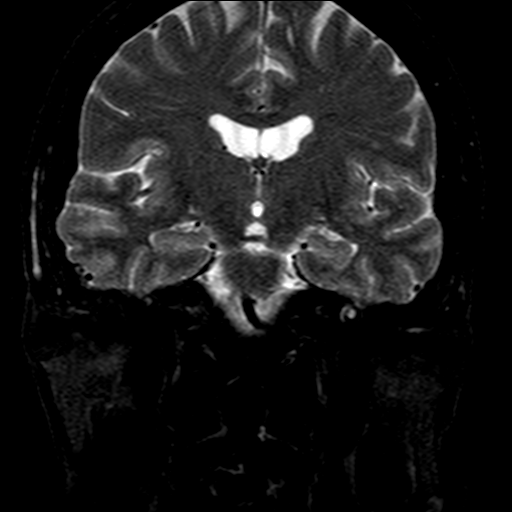
[im 15/30]
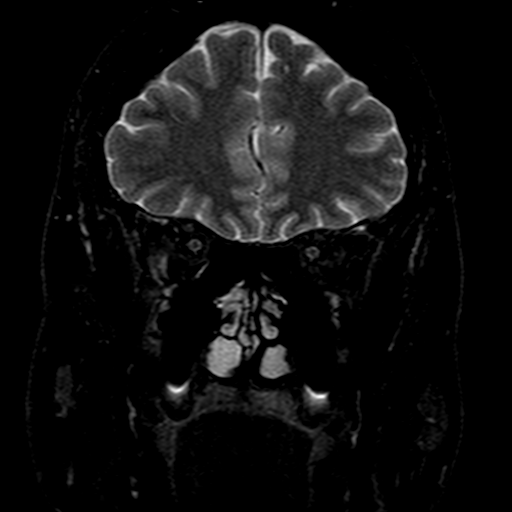

[Series 16: T1 post-contrast · axial · 3.0mm · 0.45mm/px · z∈[-90,+75]mm · 5 of 56 slices shown (1 of 2)]
[im 1/56]
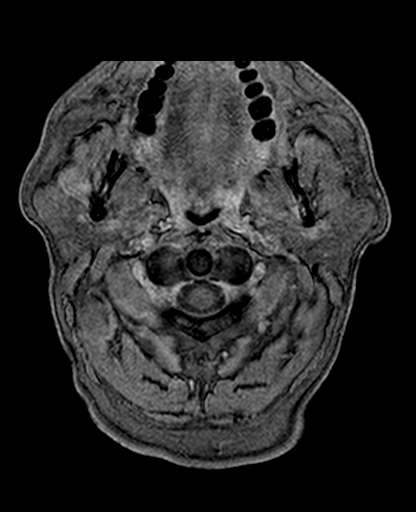
[im 14/56]
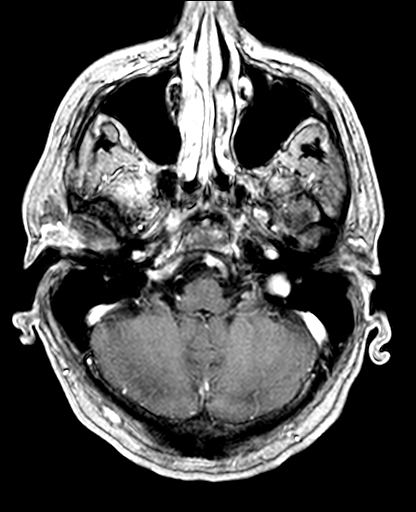
[im 28/56]
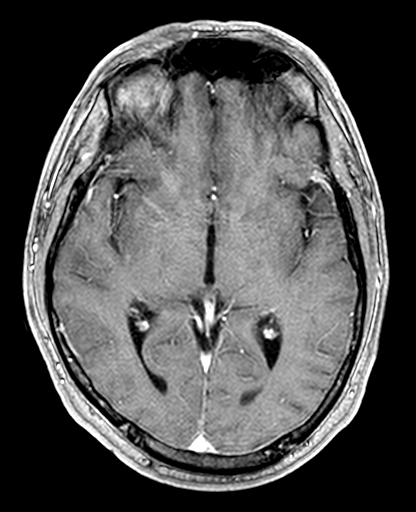
[im 42/56]
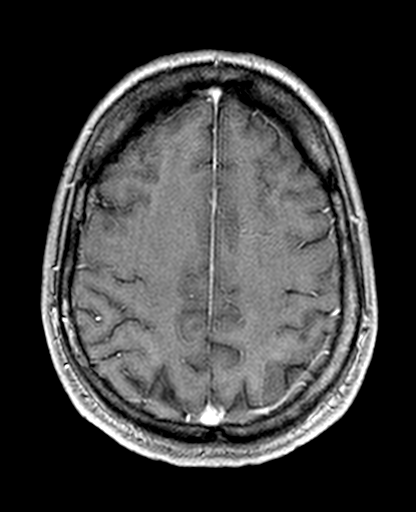
[im 56/56]
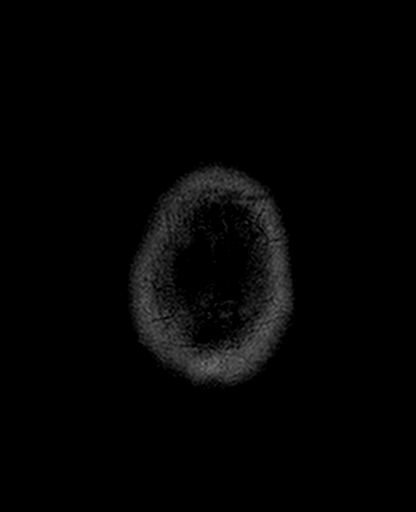

[Series 17: T1 post-contrast · coronal · 5.0mm · 0.45mm/px · 3 of 31 slices shown (2 of 2)]
[im 1/31]
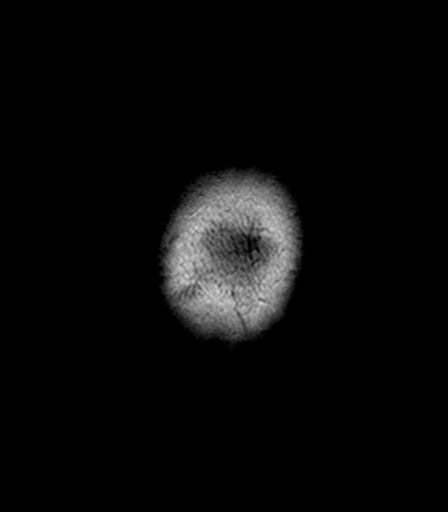
[im 16/31]
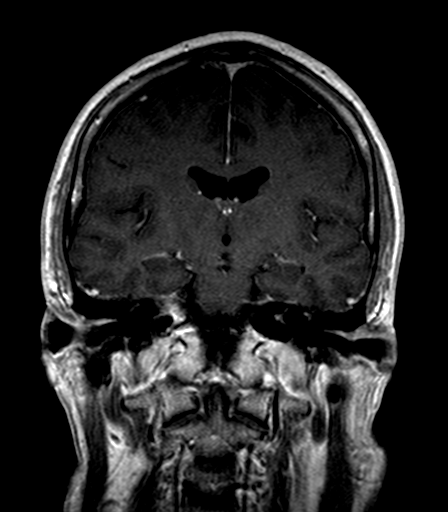
[im 31/31]
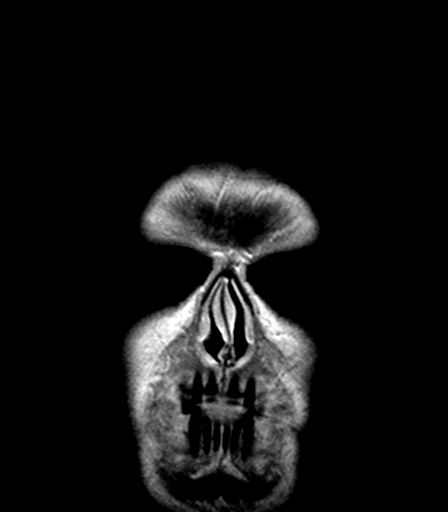

[27 of 48 positions shown; findings below may reference images not displayed]

FINDINGS: MRI HEAD FINDINGS

Cerebral volume is within normal limits. No restricted diffusion to
suggest acute infarction. No midline shift, mass effect, evidence of
mass lesion, ventriculomegaly, extra-axial collection or acute
intracranial hemorrhage. Cervicomedullary junction and pituitary are
within normal limits. Major intracranial vascular flow voids are
within normal limits.

Gray and white matter signal throughout the brain is largely normal
for age. In the left superior frontal gyrus at the pre motor area
there is a small nonenhancing cystic area involving the cortex
(series 5, image 25) with mild adjacent white matter gliosis. This
is nonspecific. No abnormal intracranial enhancement. No dural
thickening. No chronic cerebral blood products.

Visible internal auditory structures appear normal. Mastoids are
clear. Scalp soft tissues appear normal. Negative visualized
cervical spine. Normal bone marrow signal.

MRI ORBITS FINDINGS

There is no intraorbital fat stranding. The extraocular muscles are
normal. The optic nerves appears symmetric and within normal limits,
without convincing. No definite abnormal enhancement of the retina
or globes. No vitreous hemorrhage identified.

The optic chiasm appears normal. The cavernous sinus appears normal.
The paranasal sinuses demonstrate only trace ethmoid mucosal
thickening. Superficial periorbital soft tissues appear normal.
Abnormal enhancement
IMPRESSION: 1. MRI appearance of both orbits is within normal limits.
2. No acute intracranial abnormality. Negative MRI appearance of the
brain aside from a small nonspecific area of encephalomalacia in the
right superior frontal gyrus.

## 2016-12-10 DIAGNOSIS — M7752 Other enthesopathy of left foot: Secondary | ICD-10-CM | POA: Diagnosis not present

## 2016-12-10 DIAGNOSIS — M216X2 Other acquired deformities of left foot: Secondary | ICD-10-CM | POA: Diagnosis not present

## 2016-12-10 DIAGNOSIS — M79672 Pain in left foot: Secondary | ICD-10-CM | POA: Diagnosis not present

## 2016-12-10 DIAGNOSIS — M2022 Hallux rigidus, left foot: Secondary | ICD-10-CM | POA: Diagnosis not present

## 2017-01-15 ENCOUNTER — Telehealth: Payer: Self-pay | Admitting: Urology

## 2017-01-15 NOTE — Telephone Encounter (Signed)
That would be fine 

## 2017-01-15 NOTE — Telephone Encounter (Signed)
Patient is asking for a refill on his trimix. He would like it to be called in and he wants a call back letting him know when this has been done.  Sharyn Lull

## 2017-01-16 NOTE — Telephone Encounter (Signed)
[facility]

## 2017-02-24 ENCOUNTER — Telehealth: Payer: Self-pay | Admitting: Urology

## 2017-02-24 NOTE — Telephone Encounter (Signed)
Pt asking for a refill on his medication. [facility]. Pt completely out. Pt would like a call when this has been sent in. Please Advise.

## 2017-02-25 NOTE — Telephone Encounter (Signed)
Medication refilled

## 2017-05-11 ENCOUNTER — Telehealth: Payer: Self-pay | Admitting: Urology

## 2017-05-11 NOTE — Telephone Encounter (Signed)
Pt calls office asking for a refill of his medication of his Trimix at [facility] in [place]. Please advise Pt, Thank you.

## 2017-05-11 NOTE — Telephone Encounter (Signed)
That is fine to refill his Trimix at [place].

## 2017-05-12 NOTE — Telephone Encounter (Signed)
Called to place refill order, pt already has 3 refills left on his rx. Pharmacy will call him.

## 2017-06-18 ENCOUNTER — Other Ambulatory Visit
Admission: RE | Admit: 2017-06-18 | Discharge: 2017-06-18 | Disposition: A | Discharge status: Home / Self Care | Payer: 59 | Source: Ambulatory Visit | Attending: Internal Medicine | Admitting: Internal Medicine

## 2017-06-18 DIAGNOSIS — E119 Type 2 diabetes mellitus without complications: Secondary | ICD-10-CM | POA: Insufficient documentation

## 2017-06-18 DIAGNOSIS — I7301 Raynaud's syndrome with gangrene: Secondary | ICD-10-CM | POA: Insufficient documentation

## 2017-06-18 DIAGNOSIS — E7800 Pure hypercholesterolemia, unspecified: Secondary | ICD-10-CM | POA: Insufficient documentation

## 2017-06-18 DIAGNOSIS — Z0000 Encounter for general adult medical examination without abnormal findings: Secondary | ICD-10-CM | POA: Diagnosis not present

## 2017-06-18 LAB — COMPREHENSIVE METABOLIC PANEL
ALT: 31 U/L (ref 17–63)
AST: 21 U/L (ref 15–41)
Albumin: 4.4 g/dL (ref 3.5–5.0)
Alkaline Phosphatase: 57 U/L (ref 38–126)
Anion gap: 11 (ref 5–15)
BUN: 19 mg/dL (ref 6–20)
CO2: 25 mmol/L (ref 22–32)
Calcium: 9.1 mg/dL (ref 8.9–10.3)
Chloride: 103 mmol/L (ref 101–111)
Creatinine, Ser: 0.84 mg/dL (ref 0.61–1.24)
GFR calc Af Amer: >60 mL/min (ref >60)
GFR calc non Af Amer: >60 mL/min (ref >60)
Glucose, Bld: 164 mg/dL — ABNORMAL HIGH (ref 65–99)
Potassium: 4.3 mmol/L (ref 3.5–5.1)
Sodium: 139 mmol/L (ref 135–145)
Total Bilirubin: 0.5 mg/dL (ref 0.3–1.2)
Total Protein: 7.3 g/dL (ref 6.5–8.1)

## 2017-06-18 LAB — CBC
HCT: 45.7 % (ref 40.0–52.0)
Hemoglobin: 15.4 g/dL (ref 13.0–18.0)
MCH: 30.7 pg (ref 26.0–34.0)
MCHC: 33.7 g/dL (ref 32.0–36.0)
MCV: 91.3 fL (ref 80.0–100.0)
Platelets: 287 10*3/uL (ref 150–440)
RBC: 5.01 MIL/uL (ref 4.40–5.90)
RDW: 13.4 % (ref 11.5–14.5)
WBC: 5.3 10*3/uL (ref 3.8–10.6)

## 2017-06-18 LAB — LIPID PANEL
Cholesterol: 241 mg/dL — ABNORMAL HIGH (ref 0–200)
HDL: 53 mg/dL (ref >40)
LDL Cholesterol: 158 mg/dL — ABNORMAL HIGH (ref 0–99)
Total CHOL/HDL Ratio: 4.5 RATIO
Triglycerides: 149 mg/dL (ref <150)
VLDL: 30 mg/dL (ref 0–40)

## 2017-06-18 LAB — PSA: Prostatic Specific Antigen: 0.36 ng/mL (ref 0.00–4.00)

## 2017-06-18 LAB — TSH: TSH: 0.931 u[IU]/mL (ref 0.350–4.500)

## 2017-06-19 LAB — HEMOGLOBIN A1C
Hgb A1c MFr Bld: 6.9 % — ABNORMAL HIGH (ref 4.8–5.6)
Mean Plasma Glucose: 151.33 mg/dL

## 2017-06-26 DIAGNOSIS — Z0000 Encounter for general adult medical examination without abnormal findings: Secondary | ICD-10-CM | POA: Diagnosis not present

## 2017-08-12 ENCOUNTER — Telehealth: Payer: Self-pay | Admitting: Urology

## 2017-08-12 NOTE — Telephone Encounter (Signed)
Okay to fill? 

## 2017-08-12 NOTE — Telephone Encounter (Signed)
He is due for an office visit in September.  Okay to refill Trimix.

## 2017-08-12 NOTE — Telephone Encounter (Signed)
Pt asking for a refill of his Trimix at [facility] please.  Please advise. Thank you.

## 2017-08-13 NOTE — Telephone Encounter (Signed)
Done

## 2017-12-28 ENCOUNTER — Telehealth: Payer: Self-pay | Admitting: Urology

## 2017-12-28 ENCOUNTER — Other Ambulatory Visit: Payer: Self-pay

## 2017-12-28 MED ORDER — NONFORMULARY OR COMPOUNDED ITEM: 6 refills | Status: DC

## 2017-12-28 NOTE — Telephone Encounter (Signed)
Curtis Singh needs a refill on his Trimix sent to [facility].  He had syringes before with 10 mcg in each .  He also needs to be scheduled for a yearly follow up.

## 2018-01-01 ENCOUNTER — Ambulatory Visit
Admission: RE | Admit: 2018-01-01 | Discharge: 2018-01-01 | Disposition: A | Discharge status: Home / Self Care | Payer: PRIVATE HEALTH INSURANCE | Attending: Family Medicine | Admitting: Family Medicine

## 2018-01-01 ENCOUNTER — Other Ambulatory Visit: Payer: Self-pay | Admitting: Family Medicine

## 2018-01-01 ENCOUNTER — Ambulatory Visit
Admission: RE | Admit: 2018-01-01 | Discharge: 2018-01-01 | Disposition: A | Discharge status: Home / Self Care | Payer: PRIVATE HEALTH INSURANCE | Source: Ambulatory Visit | Attending: Family Medicine | Admitting: Family Medicine

## 2018-01-01 DIAGNOSIS — M545 Low back pain, unspecified: Secondary | ICD-10-CM

## 2018-01-12 ENCOUNTER — Other Ambulatory Visit: Payer: Self-pay

## 2018-01-12 ENCOUNTER — Ambulatory Visit: Payer: PRIVATE HEALTH INSURANCE | Attending: Family Medicine | Admitting: Physical Therapy

## 2018-01-12 ENCOUNTER — Encounter: Payer: Self-pay | Admitting: Physical Therapy

## 2018-01-12 DIAGNOSIS — M545 Low back pain, unspecified: Secondary | ICD-10-CM

## 2018-01-12 DIAGNOSIS — M6281 Muscle weakness (generalized): Secondary | ICD-10-CM | POA: Insufficient documentation

## 2018-01-12 DIAGNOSIS — R293 Abnormal posture: Secondary | ICD-10-CM | POA: Diagnosis present

## 2018-01-12 NOTE — Therapy (Signed)
[facility] [address] [place], [place], [address] Phone: [phone]   Fax:  [phone]  Physical Therapy Evaluation  Patient Details  Name: Curtis Singh MRN: [id number] Date of Birth: 1955-02-23 Referring Provider (PT): Milagros Evener, FNP   Encounter Date: 01/12/2018  PT End of Session - 01/13/18 1348    Visit Number  1    Number of Visits  9    Date for PT Re-Evaluation  02/10/18    PT Start Time  1602    PT Stop Time  1648    PT Time Calculation (min)  46 min    Activity Tolerance  Patient limited by pain;Patient tolerated treatment well    Behavior During Therapy  [facility] for tasks assessed/performed       Past Medical History:  Diagnosis Date  . Diabetes ([place])   . ED (erectile dysfunction)   . 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 Hypertension   . Seasonal allergies     Past Surgical History:  Procedure Laterality Date  . CATARACT EXTRACTION W/PHACO Right 09/03/2016   Procedure: CATARACT EXTRACTION PHACO AND INTRAOCULAR LENS PLACEMENT ([place]) RIGHT DIABETIC;  Surgeon: Leandrew Koyanagi, MD;  Location: [facility] [place] [facility];  Service: Ophthalmology;  Laterality: Right;  DIABETIC  . ROTATOR CUFF REPAIR Right 2009    There were no vitals filed for this visit.   Subjective Patient states that he is not in too much pain today, but notices has noticed pain in his back since the initial event about a month ago. Patient states that he will be undergoing L rotator cuff repair in February due to a previous injury unrelated to his back. He is hopeful to get his back pain under control prior to the surgery.  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 As soon as he put the boxes down, he felt pain his R low back which has spread across the  whole low back, but R>L. Patient is currently not picking up anything >20lbs and is to avoid bending, twisting, lifting.  Limitations Lifting; Standing; Walking; House hold activities; Sitting  Patient Stated Goals return to full function at work; reduce pain; be able to ride his motorcycle in the spring  Currently in Pain? Yes  Pain Score 6   Pain Location Back  Pain Orientation Right  Pain Descriptors / Indicators Tightness; Spasm  Pain Type Acute pain  Pain Radiating Towards R outer thigh  Pain Onset 1 to 4 weeks ago  Aggravating Factors  lifting, bending  Pain Relieving Factors ibuprofen, heat  Effect of Pain on Daily Activities limited work function  Multiple Pain Sites No   Pain: 6/10 Present, 6/10 Best, 8/10 Worst 24 hour pain behavior: morning; in reaction to movements throughout the day How long can you sit: <10 min How long can you stand: ~60 min. How long can you walk: unlimited Recent back trauma: Yes Prior history of back injury or pain: Yes Radiating pain: Yes  Numbness/Tingling: No Follow-up appointment with MD: Yes Dominant hand: right Imaging: Yes ; negative for fracture per pt report   OBJECTIVE  Mental Status Patient is oriented to person, place and time.  Recent memory is intact.  Remote memory is intact.  Attention span and concentration are intact.  Expressive  speech is intact.  Patient's fund of knowledge is within normal limits for educational level.  SENSATION: Grossly intact to light touch bilateral L as determined by testing dermatomes L2-S2 Proprioception and hot/cold testing deferred on this date   MUSCULOSKELETAL: Tremor: None Bulk: Normal Tone: Normal  Posture Decreased lumbar curve in standing with rounded shoulders.  Gait Mild L lateral lean with decreased stance time on RLE. Decreased pelvic movement.   Palpation R lumbar paraspinals have increased tone and are tender to palpation with greatest tenderness near L4/5 region. R QL  has increased tenderness to palpation greatest near the iliac crest.   Strength (out of 5) R/L 4/3+** Hip flexion; L hip ER compensation with applied force, pain is produced in R lumbar region 4/4 Hip ER 4/4 Hip IR 4/4 Hip abduction (seated) 4/4 Hip adduction (seated) 3+/4 Hip extension 5/5 Knee extension 5/5 Knee flexion 5/5 Ankle dorsiflexion *Indicates pain   AROM (degrees) R/L (all movements include overpressure unless otherwise stated) Lumbar forward flexion (0-65): WFL * pain when extending from flexed position Lumbar extension (0-30): WFL Lumbar lateral flexion (0-25): R:WFL  L: WFL*  Lumbar rotation: WFL, calms pain Hip IR (0-45): B: WFL Hip ER (0-45): B: WFL Hip Flexion (0-125): WFL Hip Abduction (0-40): B: WFL Hip extension (0-15): B: WFL *Indicates pain  PROM (degrees) PROM = AROM *Indicates pain  Repeated Movements No centralization or peripheralization of symptoms with repeated lumbar extension or flexion.    Muscle Length Hamstrings (popliteal angle): R: 180 degrees L: 180 degrees    Passive Accessory Intervertebral Motion (PAIVM) Pt has reproduction of concordant R LBP with CPA at L3/4, with decreased intensity with repetitions. Spine moves well throughout with no significant limitations noted with CPA L1-L5 and UPA bilaterally L1-L5.      SPECIAL TESTS Lumbar quadrant: R: Negative L: Negative Slump: R: Negative L: Negative SLR: R: Negative L:  Negative Crossed SLR: R: Negative L: Negative FABER: R: Negative L: Negative FADIR: R: Negative L: Negative  [place]: R: Positive L: Negative  Objective measurements completed on examination: See above findings.   TREATMENT  Therapeutic Exercise: Single knee to chest 3x20 sec each leg Hooklying trunk rotation 6x10 sec each direction Supine figure 4 stretch 3x20 sec each leg  Manual Interventions: STM R lumbar paraspinals x5 min Grade II CPAs L2-L5 2 x20 sec bouts for pain modulation  Patient reports  2/10 pain after manual interventions and stretches.   Patient educated on log roll technique for transfers supine<>sit.   PT Education - 01/12/18 1838    Education Details  HEP, prognosis, POC    Person(s) Educated  Patient    Methods  Explanation;Demonstration;Handout    Comprehension  Verbalized understanding          PT Long Term Goals - 01/13/18 1342      PT LONG TERM GOAL #1   Title  Patient will be independent with HEP  in order to improve strength and decrease back pain in order to improve pain-free function at home and work.     Baseline  01/12/2018: HEP provided    Time  4    Period  Weeks    Status  New    Target Date  02/10/18      PT LONG TERM GOAL #2   Title  Patient will demonstrate improved function as evidenced by a FOTO score of > 62/100 in order to return to PLOF at home and work.    Baseline  01/12/2018: 50/100  Time  4    Period  Weeks    Status  New    Target Date  02/10/18      PT LONG TERM GOAL #3   Title  Patient will demonstrate safe and proper lifting mechanics for boxes from below shoulder height with appropriate abdominal and leg strategies in order to return to his PLOF at work.    Baseline  01/12/2018: patient relies on hamstrings and back extensors    Time  4    Period  Weeks    Status  New    Target Date  02/10/18      PT LONG TERM GOAL #4   Title  Pt will decrease worst back pain as reported on NPRS by at least 2 points in order to demonstrate clinically significant reduction in back pain.     Baseline  01/12/2018: 8/10 worst    Time  4    Period  Weeks    Status  New    Target Date  02/10/18             Plan - 01/12/18 1839    Clinical Impression Statement  Patient is a pleasant 63 year old male presenting to clinic with chief complaint of R sided low back pain which limits his function at work and home. Examination revealed deficits in strength (4/5 BLE grossly), mobility, and pain free ROM ( most pain with lumbar flexion and L  lateral flexion). Patient will benefit from skilled therapeutic intervention to address aforementioned deficits and improve body mechanics for safe lifting practices to prevent the recurrence of injury on the job.    History and Personal Factors relevant to plan of care:  (+) excellent motivation, active lifestyle; (-) hx of back pain, upcoming shoulder surgery    Clinical Presentation  Stable    Clinical Presentation due to:  Low (stable): no personal factors/comorbidities, 1-2 body systems/activity limitations/participation restrictions      Clinical Decision Making  Low    Rehab Potential  Excellent    PT Frequency  2x / week    PT Duration  4 weeks    PT Treatment/Interventions  Aquatic Therapy;Cryotherapy;Electrical Stimulation;Iontophoresis 4mg /ml Dexamethasone;Moist Heat;Functional mobility training;Stair training;Therapeutic activities;Therapeutic exercise;Balance training;Neuromuscular re-education;Patient/family education;Manual techniques;Dry needling;Passive range of motion;Taping;Spinal Manipulations;Joint Manipulations    PT Next Visit Plan  lifting mechanics/modifications, manual techniques, core strengthening    PT Home Exercise Plan  Access Code: [email or web address] (HLTR, supine piriformis stretch, single knee to chest)    Consulted and Agree with Plan of Care  Patient       Patient will benefit from skilled therapeutic intervention in order to improve the following deficits and impairments:  Abnormal gait, Pain, Improper body mechanics, Decreased mobility, Increased muscle spasms, Postural dysfunction, Difficulty walking, Decreased strength, Decreased range of motion, Decreased activity tolerance  Visit Diagnosis: Acute low back pain, unspecified back pain laterality, unspecified whether sciatica present  Muscle weakness (generalized)  Abnormal posture     Problem List Patient Active Problem List   Diagnosis Date Noted  . ED (erectile dysfunction) of organic origin 02/21/2015   . Diabetes ([place]) 10/18/2014  . Hyperlipemia 10/18/2014   Myles Gip PT, DPT [phone] 01/13/2018, 2:50 PM  [facility] [address] [place], [place], [address] Phone: [phone]   Fax:  [phone]  Name: Curtis Singh MRN: [id number] Date of Birth: 11-Dec-1955

## 2018-01-13 NOTE — Patient Instructions (Signed)
Access Code: [email or web address]  URL: [email or web address]  Date: 01/12/2018  Prepared by: Myles Gip   Exercises  Supine Figure 4 Piriformis Stretch - 3 sets - 20 hold - 1x daily - 7x weekly  Supine Single Knee to Chest Stretch - 3 sets - 20 hold - 1x daily - 7x weekly  Supine Lower Trunk Rotation - 6 sets - 10 hold - 1x daily - 7x weekly

## 2018-01-14 ENCOUNTER — Ambulatory Visit: Payer: PRIVATE HEALTH INSURANCE | Admitting: Physical Therapy

## 2018-01-14 ENCOUNTER — Encounter: Payer: Self-pay | Admitting: Physical Therapy

## 2018-01-14 DIAGNOSIS — M545 Low back pain, unspecified: Secondary | ICD-10-CM

## 2018-01-14 DIAGNOSIS — M6281 Muscle weakness (generalized): Secondary | ICD-10-CM

## 2018-01-14 DIAGNOSIS — R293 Abnormal posture: Secondary | ICD-10-CM

## 2018-01-14 NOTE — Therapy (Signed)
[facility] [address] [place], [place], [address] Phone: [phone]   Fax:  [phone]  Physical Therapy Treatment  Patient Details  Name: Curtis Singh MRN: [id number] Date of Birth: [birth year] Referring Provider (PT): Milagros Evener, FNP   Encounter Date: 01/14/2018  PT End of Session - 01/14/18 1804    Visit Number  2    Number of Visits  9    Date for PT Re-Evaluation  02/10/18    PT Start Time  1536    PT Stop Time  7169    PT Time Calculation (min)  82 min    Activity Tolerance  Patient limited by pain;Patient tolerated treatment well    Behavior During Therapy  [facility] for tasks assessed/performed       Past Medical History:  Diagnosis Date  . Diabetes ([place])   . ED (erectile dysfunction)   . 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 Hypertension   . Seasonal allergies     Past Surgical History:  Procedure Laterality Date  . CATARACT EXTRACTION W/PHACO Right 09/03/2016   Procedure: CATARACT EXTRACTION PHACO AND INTRAOCULAR LENS PLACEMENT ([place]) RIGHT DIABETIC;  Surgeon: Leandrew Koyanagi, MD;  Location: [place];  Service: Ophthalmology;  Laterality: Right;  DIABETIC  . ROTATOR CUFF REPAIR Right 2009    There were no vitals filed for this visit.  Subjective Assessment - 01/14/18 1738    Subjective  Patient stated waking up with pain last night but was controlled with ibuprofen. Pt. stated being in some pain 5/10 NPS. 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 As soon as he put the boxes down, he felt pain his R low back which has spread across the whole low back, but R>L. Patient is currently not picking up anything >20lbs and is to avoid bending,  twisting, lifting.    Limitations  Lifting;Standing;Walking;House hold activities;Sitting    Patient Stated Goals  return to full function at work; reduce pain; be able to ride his motorcycle in the spring    Currently in Pain?  Yes    Pain Score  5     Pain Location  Back    Pain Orientation  Right    Pain Descriptors / Indicators  Dull;Tightness    Pain Type  Acute pain    Pain Onset  1 to 4 weeks ago    Aggravating Factors   Lifting, bending    Pain Relieving Factors  ibuprofen, heat    Effect of Pain on Daily Activities  Limited work function     Multiple Pain Sites  No          Treatment:  There.ex.:  TrA exercise plan; see HEP page 1-2 (10x1 with 5 sec hold).    Manual tx.:   Reviewed current HEP/ stretches. Prone trunk extensions x10 with MH used in varring positions with grade 2/3 mobilizations. Grade II-III UPA mobs at L1-5 3x30sec. Each in prone position STM to bilat lumbar paraspinals in prone position for 8 min.         PT Education - 01/14/18 1750    Education Details  Pt. given and instructed on transverse abdominus progression to  be added to HEP.     Person(s) Educated  Patient    Methods  Explanation;Demonstration    Comprehension  Verbalized understanding          PT Long Term Goals - 01/13/18 1342      PT LONG TERM GOAL #1   Title  Patient will be independent with HEP  in order to improve strength and decrease back pain in order to improve pain-free function at home and work.     Baseline  01/12/2018: HEP provided    Time  4    Period  Weeks    Status  New    Target Date  02/10/18      PT LONG TERM GOAL #2   Title  Patient will demonstrate improved function as evidenced by a FOTO score of > 62/100 in order to return to PLOF at home and work.    Baseline  01/12/2018: 50/100    Time  4    Period  Weeks    Status  New    Target Date  02/10/18      PT LONG TERM GOAL #3   Title  Patient will demonstrate safe and proper lifting mechanics for  boxes from below shoulder height with appropriate abdominal and leg strategies in order to return to his PLOF at work.    Baseline  01/12/2018: patient relies on hamstrings and back extensors    Time  4    Period  Weeks    Status  New    Target Date  02/10/18      PT LONG TERM GOAL #4   Title  Pt will decrease worst back pain as reported on NPRS by at least 2 points in order to demonstrate clinically significant reduction in back pain.     Baseline  01/12/2018: 8/10 worst    Time  4    Period  Weeks    Status  New    Target Date  02/10/18            Plan - 01/14/18 1752    Clinical Impression Statement  Pt. demonstrated hypomobility with UPA at L3-L5 with referred pain. Pt. noted that pain and mobility improved after UPA. Patient was able to achieve pain free single leg bridge exercises without pain with transverse abdominus activation with PT cueing. Patient will continue to improve pain and mobility with skilled PT manual therapy.  Pt. pain improved to 2/10 NPS after manual therapy treatment.     History and Personal Factors relevant to plan of care:  (+) excellent motivation, active lifestyle; (-) hx of back pain, upcoming shoulder surgery.     Clinical Presentation  Stable    Clinical Presentation due to:  Low (stable): no personal factors/comorbidities, 1-2 body systems/activty limitations/participation restrictions    Clinical Decision Making  Low    PT Frequency  2x / week    PT Duration  4 weeks    PT Treatment/Interventions  Aquatic Therapy;Cryotherapy;Electrical Stimulation;Iontophoresis 4mg /ml Dexamethasone;Moist Heat;Functional mobility training;Stair training;Therapeutic activities;Therapeutic exercise;Balance training;Neuromuscular re-education;Patient/family education;Manual techniques;Dry needling;Passive range of motion;Taping;Spinal Manipulations;Joint Manipulations    PT Next Visit Plan  lifting mechanics/modifications, manual techniques, core strengthening    PT Home  Exercise Plan  Access Code: [email or web address] (HLTR, supine piriformis stretch, single knee to chest)    Consulted and Agree with Plan of Care  Patient       Patient will benefit from skilled therapeutic intervention in order to improve the following deficits and impairments:  Abnormal  gait, Pain, Improper body mechanics, Decreased mobility, Increased muscle spasms, Postural dysfunction, Difficulty walking, Decreased strength, Decreased range of motion, Decreased activity tolerance  Visit Diagnosis: Acute low back pain, unspecified back pain laterality, unspecified whether sciatica present  Muscle weakness (generalized)  Abnormal posture     Problem List Patient Active Problem List   Diagnosis Date Noted  . ED (erectile dysfunction) of organic origin 02/21/2015  . Diabetes ([place]) 10/18/2014  . Hyperlipemia 10/18/2014   Pura Spice, PT, DPT # [phone] Florentina Jenny, SPT 01/14/2018, 6:09 PM  [facility] [address] [place], [place], [address] Phone: [phone]   Fax:  [phone]  Name: Curtis Singh MRN: [id number] Date of Birth: 01-01-1956

## 2018-01-18 ENCOUNTER — Ambulatory Visit: Payer: PRIVATE HEALTH INSURANCE | Admitting: Physical Therapy

## 2018-01-18 DIAGNOSIS — R293 Abnormal posture: Secondary | ICD-10-CM

## 2018-01-18 DIAGNOSIS — M545 Low back pain, unspecified: Secondary | ICD-10-CM

## 2018-01-18 DIAGNOSIS — M6281 Muscle weakness (generalized): Secondary | ICD-10-CM

## 2018-01-19 ENCOUNTER — Encounter: Payer: Self-pay | Admitting: Physical Therapy

## 2018-01-19 NOTE — Therapy (Signed)
[facility] [address] [place], [place], [address] Phone: [phone]   Fax:  [phone]  Physical Therapy Treatment  Patient Details  Name: Curtis Singh MRN: [id number] Date of Birth: [birth year] Referring Provider (PT): Milagros Evener, FNP   Encounter Date: 01/18/2018  PT End of Session - 01/19/18 1529    Visit Number  3    Number of Visits  9    Date for PT Re-Evaluation  02/10/18    PT Start Time  1941    PT Stop Time  1626    PT Time Calculation (min)  48 min    Activity Tolerance  Patient limited by pain;Patient tolerated treatment well    Behavior During Therapy  [facility] for tasks assessed/performed       Past Medical History:  Diagnosis Date  . Diabetes ([place])   . ED (erectile dysfunction)   . 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 Hypertension   . Seasonal allergies     Past Surgical History:  Procedure Laterality Date  . CATARACT EXTRACTION W/PHACO Right 09/03/2016   Procedure: CATARACT EXTRACTION PHACO AND INTRAOCULAR LENS PLACEMENT ([place]) RIGHT DIABETIC;  Surgeon: Leandrew Koyanagi, MD;  Location: [facility];  Service: Ophthalmology;  Laterality: Right;  DIABETIC  . ROTATOR CUFF REPAIR Right 2009    There were no vitals filed for this visit.  Subjective Assessment - 01/19/18 1527    Subjective  Patient reported that he felt so great after his last PT session that he spent his weekend painting in his house and took a ~2 hour motorcycle ride. He notes that these activities caused some increased back pain. He continues to have some relief from his heating pad and reports compliance with his HEP stretches which he feels are helpful.    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 As soon as he put the boxes down, he  felt pain his R low back which has spread across the whole low back, but R>L. Patient is currently not picking up anything >20lbs and is to avoid bending, twisting, lifting.    Limitations  Lifting;Standing;Walking;House hold activities;Sitting    Patient Stated Goals  return to full function at work; reduce pain; be able to ride his motorcycle in the spring    Currently in Pain?  Yes    Pain Score  6     Pain Location  Back    Pain Orientation  Right    Pain Onset  1 to 4 weeks ago       TREATMENT  Therapeutic Exercise: Supine TrA w/ BKFO x5 each leg. Patient requires cueing for set-up and anchoring through non-active limbs for increased pelvic and lumbar stability. ROM limited to optimize control and abdominal strengthening.  Prone press ups w/ CPA L3-L5 10 sec hold x2 at each level  Lifting mechanics:  -box lift with squat strategy x5, Patient benefits from cueing for box proximity to center of mass, hip hinge initiation, wide BOS, and abdominal activation with exhalation on exertion to prevent reliance on back musculature.  -box lift with half-kneeling strategy x5. Patient limited here due to knee pain. Unable to kneel on R knee. Demonstrated appropriate sequencing but is limited by LLE strength.  -box lift with modified golfer's lift  for light objects x3. Patient demonstrates most coordinated sequencing with this strategy as it allows him to rely on his hamstring extensibility which is exceptional. However, patient may have some balance deficits which impact the utility of this strategy.   Patient educated extensively on activity modification and appropriate loading of the spine in relationship with rest periods to prevent increased pain while developing strength and coordination. Patient encouraged to continue to engage in pleasurable activities (i.e. motorcycle rides, home renovation), but perform self-assessment questions: Am I going to pay for this later? Am I doing any  damage?  Manual Therapy: CPAs and B UPAs grade III/IV T7-L5. Patient reports decreased pain with repetitions. No appreciable change in joint mobility after mobilization.  STM to lumbar paraspinals, R>L and R QL with increased tenderness to lateral R QL concordant with initial pain report on evaluation. Patient reports decreased tenderness after soft tissue work.  Neuromuscular Re-education: Supine TrA activation with breath coordination x7 breaths. Patient requires verbal and tactile cues for optimal activation and to prevent gluteal compensations. Patient educated on self-tactile cueing for incorporation in HEP.  Cat-Cow with spinal segmentation x3. Patient requires max VCs and tactile cues to achieve controlled mobility of the spine with greatest limitations noted in thoracic extension and lumbar flexion. Patient has compensatory movement patterns at BUE (elbows) and hips. Hands/wrists positioned directly under shoulders to prevent aggravation of previous shoulder injury. Patient notes this activity is difficult.   Treatments unbilled: MHP in supine hooklying to lumbar spine during hx and activity modification education.    PT Long Term Goals - 01/13/18 1342      PT LONG TERM GOAL #1   Title  Patient will be independent with HEP  in order to improve strength and decrease back pain in order to improve pain-free function at home and work.     Baseline  01/12/2018: HEP provided    Time  4    Period  Weeks    Status  New    Target Date  02/10/18      PT LONG TERM GOAL #2   Title  Patient will demonstrate improved function as evidenced by a FOTO score of > 62/100 in order to return to PLOF at home and work.    Baseline  01/12/2018: 50/100    Time  4    Period  Weeks    Status  New    Target Date  02/10/18      PT LONG TERM GOAL #3   Title  Patient will demonstrate safe and proper lifting mechanics for boxes from below shoulder height with appropriate abdominal and leg strategies in  order to return to his PLOF at work.    Baseline  01/12/2018: patient relies on hamstrings and back extensors    Time  4    Period  Weeks    Status  New    Target Date  02/10/18      PT LONG TERM GOAL #4   Title  Pt will decrease worst back pain as reported on NPRS by at least 2 points in order to demonstrate clinically significant reduction in back pain.     Baseline  01/12/2018: 8/10 worst    Time  4    Period  Weeks    Status  New    Target Date  02/10/18            Plan - 01/19/18 1530    Clinical Impression Statement  Patient presents to clinic with excellent  motivation despite increased pain (6/10). Patient continues to benefit from gentle stretches, core strengthening, manual interventions, and body mechanics education to address limitations in spinal mobility, posture, strength, and function for ability to meet work demands.    PT Frequency  2x / week    PT Duration  4 weeks    PT Treatment/Interventions  Aquatic Therapy;Cryotherapy;Electrical Stimulation;Iontophoresis 4mg /ml Dexamethasone;Moist Heat;Functional mobility training;Stair training;Therapeutic activities;Therapeutic exercise;Balance training;Neuromuscular re-education;Patient/family education;Manual techniques;Dry needling;Passive range of motion;Taping;Spinal Manipulations;Joint Manipulations    PT Next Visit Plan  lifting mechanics/modifications, manual techniques, core strengthening    PT Home Exercise Plan  Access Code: [email or web address] (HLTR, supine piriformis stretch, single knee to chest)    Consulted and Agree with Plan of Care  Patient       Patient will benefit from skilled therapeutic intervention in order to improve the following deficits and impairments:  Abnormal gait, Pain, Improper body mechanics, Decreased mobility, Increased muscle spasms, Postural dysfunction, Difficulty walking, Decreased strength, Decreased range of motion, Decreased activity tolerance  Visit Diagnosis: Acute low back pain, unspecified  back pain laterality, unspecified whether sciatica present  Muscle weakness (generalized)  Abnormal posture     Problem List Patient Active Problem List   Diagnosis Date Noted  . ED (erectile dysfunction) of organic origin 02/21/2015  . Diabetes ([place]) 10/18/2014  . Hyperlipemia 10/18/2014   Myles Gip PT, DPT [id number] 01/19/2018, 4:06 PM  [facility] [address] [place], [place], [address] Phone: [phone]   Fax:  [phone]  Name: SHAILEN THIELEN MRN: [id number] Date of Birth: Sep 19, 1955

## 2018-01-21 ENCOUNTER — Ambulatory Visit: Payer: PRIVATE HEALTH INSURANCE | Admitting: Physical Therapy

## 2018-01-28 ENCOUNTER — Other Ambulatory Visit: Payer: Self-pay | Admitting: Orthopedic Surgery

## 2018-01-28 ENCOUNTER — Encounter: Payer: Self-pay | Admitting: Physical Therapy

## 2018-01-28 ENCOUNTER — Ambulatory Visit: Payer: PRIVATE HEALTH INSURANCE | Admitting: Physical Therapy

## 2018-01-28 DIAGNOSIS — M25512 Pain in left shoulder: Secondary | ICD-10-CM

## 2018-01-28 DIAGNOSIS — M545 Low back pain, unspecified: Secondary | ICD-10-CM

## 2018-01-28 DIAGNOSIS — M6281 Muscle weakness (generalized): Secondary | ICD-10-CM

## 2018-01-28 DIAGNOSIS — R293 Abnormal posture: Secondary | ICD-10-CM

## 2018-01-28 NOTE — Therapy (Signed)
[facility] [address] [place], [place], [address] Phone: [phone]   Fax:  [phone]  Physical Therapy Treatment  Patient Details  Name: Curtis Singh MRN: [id number] Date of Birth: January 11, 1955 Referring Provider (PT): Milagros Evener, FNP   Encounter Date: 01/28/2018  PT End of Session - 01/29/18 0757    Visit Number  4    Number of Visits  9    Date for PT Re-Evaluation  02/10/18    PT Start Time  9390    PT Stop Time  3009    PT Time Calculation (min)  62 min    Activity Tolerance  Patient limited by pain;Patient tolerated treatment well    Behavior During Therapy  [facility] for tasks assessed/performed       Past Medical History:  Diagnosis Date  . Diabetes ([place])   . ED (erectile dysfunction)   . 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 Hypertension   . Seasonal allergies     Past Surgical History:  Procedure Laterality Date  . CATARACT EXTRACTION W/PHACO Right 09/03/2016   Procedure: CATARACT EXTRACTION PHACO AND INTRAOCULAR LENS PLACEMENT ([place]) RIGHT DIABETIC;  Surgeon: Leandrew Koyanagi, MD;  Location: [facility];  Service: Ophthalmology;  Laterality: Right;  DIABETIC  . ROTATOR CUFF REPAIR Right 2009    There were no vitals filed for this visit.  Subjective Assessment - 01/29/18 0748    Subjective  Pt. reports he has been doing well but his back has not gotten much better from last session. Pt. stated he is having an MRI for his shoulder coming up. Pt. Also noted he still experiences some pian in his back when he wakes in the morning but manages it with using a heat pad.     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 As soon as he put the boxes down, he felt pain his R low back which has spread across the whole  low back, but R>L. Patient is currently not picking up anything >20lbs and is to avoid bending, twisting, lifting.    Limitations  Lifting;Standing;Walking;House hold activities;Sitting    Patient Stated Goals  return to full function at work; reduce pain; be able to ride his motorcycle in the spring    Currently in Pain?  Yes    Pain Score  5     Pain Location  Back    Pain Orientation  Right    Pain Descriptors / Indicators  Dull;Tightness    Pain Type  Acute pain    Pain Onset  1 to 4 weeks ago    Aggravating Factors   Lifting, bending    Pain Relieving Factors  ibuprofen, heat    Effect of Pain on Daily Activities  Limited work function        Treatment:   Therex:   Page 1 and 2 transverse abdominus core progression. (pt. Needed mod verbal and tactile cueing to achieve proper activation)  Lumbar rotation in supine 1x15 Bilat sidelying open book mobility 2x10 (PT assist to block hips to maintain quality  rotation.)    Manual:  Grade II-III UPA mobs at T12-5 2x30sec. Each in prone position Grade II-III CPA mobs at T12-5 2x30sec. Each in prone position STM to bilat lumbar paraspinals  in prone position for 9 min.   Pt. Received MH to lumbar spine for 10 min in supine position with bolster under legs.     PT Long Term Goals - 01/13/18 1342      PT LONG TERM GOAL #1   Title  Patient will be independent with HEP  in order to improve strength and decrease back pain in order to improve pain-free function at home and work.     Baseline  01/12/2018: HEP provided    Time  4    Period  Weeks    Status  New    Target Date  02/10/18      PT LONG TERM GOAL #2   Title  Patient will demonstrate improved function as evidenced by a FOTO score of > 62/100 in order to return to PLOF at home and work.    Baseline  01/12/2018: 50/100    Time  4    Period  Weeks    Status  New    Target Date  02/10/18      PT LONG TERM GOAL #3   Title  Patient will demonstrate safe and proper lifting  mechanics for boxes from below shoulder height with appropriate abdominal and leg strategies in order to return to his PLOF at work.    Baseline  01/12/2018: patient relies on hamstrings and back extensors    Time  4    Period  Weeks    Status  New    Target Date  02/10/18      PT LONG TERM GOAL #4   Title  Pt will decrease worst back pain as reported on NPRS by at least 2 points in order to demonstrate clinically significant reduction in back pain.     Baseline  01/12/2018: 8/10 worst    Time  4    Period  Weeks    Status  New    Target Date  02/10/18         Plan - 01/29/18 0758    Clinical Impression Statement  Pt. Presented to the clinic with some pain localized in the right lumbar paraspinals. Pt. Demonstrated pain and soreness during extension and right and left rotation during PT reassessment. PT noted hypomobility with CPA and bilat UPA's from T12-L5. Pt. Tolerated spinal mobilization without increase in pain with the exception of right UPA's form L3-L5 due to tenderness of superficial muscle soreness. Pt. Was sore and hypomobile during side lying lumbar rotations following spinal mobilizations. PT performed STM in prone position and noted trigger point tenderness along right lumbar paraspinals. Pt. Reported relief of pain with STM. Pt. Was reeducated on transverse abdominus core progression and given page 1 and 2 of TrA progression for continued HEP. Pt. Will continue to benefit from skilled PT to increase lumbar spinal movement and decrease muscle guarding and pain.    History and Personal Factors relevant to plan of care:  (+) excellent motivation, active lifestyle; (-) hx of back pain, upcoming shoulder MRI and surgery.     Clinical Presentation  Stable    Clinical Presentation due to:  Low (stable): no personal factors/comorbidities, 1-2 body systems/activity limitations/participation restrictions.     Clinical Decision Making  Low    Rehab Potential  Excellent    PT Frequency  2x /  week    PT Duration  4 weeks    PT Treatment/Interventions  Aquatic Therapy;Cryotherapy;Electrical Stimulation;Iontophoresis 4mg /ml Dexamethasone;Moist Heat;Functional mobility training;Stair training;Therapeutic activities;Therapeutic exercise;Balance training;Neuromuscular re-education;Patient/family education;Manual techniques;Dry needling;Passive range  of motion;Taping;Spinal Manipulations;Joint Manipulations    PT Next Visit Plan  Assess possibility of dry needling and continued core strengthening.     PT Home Exercise Plan  Access Code: [email or web address] (HLTR, supine piriformis stretch, single knee to chest). Page 1 and 2 transverse abdominus core progression.     Consulted and Agree with Plan of Care  Patient        Pt.  Visit Diagnosis: Acute low back pain, unspecified back pain laterality, unspecified whether sciatica present  Muscle weakness (generalized)  Abnormal posture     Problem List Patient Active Problem List   Diagnosis Date Noted  . ED (erectile dysfunction) of organic origin 02/21/2015  . Diabetes ([place]) 10/18/2014  . Hyperlipemia 10/18/2014   Pura Spice, PT, DPT # [phone] Florentina Jenny, SPT 01/29/2018, 8:28 AM  [facility] [address] [place], [place], [address] Phone: [phone]   Fax:  [phone]  Name: Curtis Singh MRN: [id number] Date of Birth: Oct 07, 1955

## 2018-02-01 ENCOUNTER — Ambulatory Visit: Payer: PRIVATE HEALTH INSURANCE | Admitting: Physical Therapy

## 2018-02-01 ENCOUNTER — Encounter: Payer: Self-pay | Admitting: Physical Therapy

## 2018-02-01 DIAGNOSIS — M6281 Muscle weakness (generalized): Secondary | ICD-10-CM

## 2018-02-01 DIAGNOSIS — M545 Low back pain, unspecified: Secondary | ICD-10-CM

## 2018-02-01 DIAGNOSIS — R293 Abnormal posture: Secondary | ICD-10-CM

## 2018-02-01 NOTE — Therapy (Signed)
[facility] [address] [place], [place], [address] Phone: [phone]   Fax:  [phone]  Physical Therapy Treatment  Patient Details  Name: Curtis Singh MRN: [id number] Date of Birth: [birth year] Referring Provider (PT): Milagros Evener, FNP   Encounter Date: 02/01/2018  PT End of Session - 02/01/18 1713    Visit Number  5    Number of Visits  9    Date for PT Re-Evaluation  02/10/18    PT Start Time  2992    PT Stop Time  4268    PT Time Calculation (min)  64 min    Activity Tolerance  Patient limited by pain;Patient tolerated treatment well       Past Medical History:  Diagnosis Date  . Diabetes ([place])   . ED (erectile dysfunction)   . 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 Hypertension   . Seasonal allergies     Past Surgical History:  Procedure Laterality Date  . CATARACT EXTRACTION W/PHACO Right 09/03/2016   Procedure: CATARACT EXTRACTION PHACO AND INTRAOCULAR LENS PLACEMENT ([place]) RIGHT DIABETIC;  Surgeon: Leandrew Koyanagi, MD;  Location: [place];  Service: Ophthalmology;  Laterality: Right;  DIABETIC  . ROTATOR CUFF REPAIR Right 2009    There were no vitals filed for this visit.  Subjective Assessment - 02/01/18 1554    Subjective  Pt. Stated that his back felt pretty good over the weekend with exception to changing oil on his truck and then today when getting up quick out of his office chair. Pt. Reports continuing to use heat to modulate his pain. Pt. Noted he has been keeping up with his HEP.     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 As soon as he put the boxes down, he felt pain his R low back which has spread across the whole low back, but R>L. Patient is currently not picking up anything >20lbs  and is to avoid bending, twisting, lifting.    Limitations  Lifting;Standing;Walking;House hold activities;Sitting    Patient Stated Goals  return to full function at work; reduce pain; be able to ride his motorcycle in the spring    Currently in Pain?  Yes    Pain Score  4     Pain Location  Back    Pain Orientation  Right    Pain Descriptors / Indicators  Dull;Tingling    Pain Type  Acute pain    Pain Onset  1 to 4 weeks ago        Treatment:   Therex:   Transverse abdominal activation with lumbar rotation (PT min cueing for pt. To keep shoulders on mat for full lumbar rotation) MET in supine position to improve right lumbar QL lengthening and encourage symmetrical leg length.      Manual:  Grade II-III UPA mobs at T12-5 3x30sec. Each in prone position (pt. Reported relief with mobs) Grade II-III CPA mobs at T12-5 3x30sec. Each in prone position STM to bilat lumbar paraspinals in prone position for 10 min. Long axis right hip distraction 3x30 sec in supine position.   Pt. Received MH to lumbar spine for 10 min in supine position with bolster under legs.    Patient educated on the importance of modifying activity  in the presence of pain so as to avoid setbacks and encouraged to continue with HEP compliance. Patient educated on load considerations for activity including (frequency, duration, intensity, time/repetitions) to improve understanding of activity/exercise progressions as pain continues to decrease. Patient needs further instruction and reinforcement of these concepts to improve adherence to safe and effective self-management.   PT Long Term Goals - 01/13/18 1342      PT LONG TERM GOAL #1   Title  Patient will be independent with HEP  in order to improve strength and decrease back pain in order to improve pain-free function at home and work.     Baseline  01/12/2018: HEP provided    Time  4    Period  Weeks    Status  New    Target Date  02/10/18      PT LONG  TERM GOAL #2   Title  Patient will demonstrate improved function as evidenced by a FOTO score of > 62/100 in order to return to PLOF at home and work.    Baseline  01/12/2018: 50/100    Time  4    Period  Weeks    Status  New    Target Date  02/10/18      PT LONG TERM GOAL #3   Title  Patient will demonstrate safe and proper lifting mechanics for boxes from below shoulder height with appropriate abdominal and leg strategies in order to return to his PLOF at work.    Baseline  01/12/2018: patient relies on hamstrings and back extensors    Time  4    Period  Weeks    Status  New    Target Date  02/10/18      PT LONG TERM GOAL #4   Title  Pt will decrease worst back pain as reported on NPRS by at least 2 points in order to demonstrate clinically significant reduction in back pain.     Baseline  01/12/2018: 8/10 worst    Time  4    Period  Weeks    Status  New    Target Date  02/10/18            Plan - 02/01/18 1714    Clinical Impression Statement  Pt. Entered clinic with mild pain today centrally located over L5/S1 region. PT assess SIJ nutation and counter nutation without any increase in pain. Pt. Reported decrease in pain with CPA/UPA mobilizations along L1-L5. During assessment, PT noted apparent leg length discrepancy with the right leg shorter than left. PT noted more normalized leg length with long axis distraction of the right hip. Pt. Reported ease of symptoms with hip distraction. Pt. Educated to perform MET of right leg into mat to lengthen right hip hike and posterior hip rotation. Pt. Will continue to benefit from skilled PT to achieve ability to tolerate full work load at work without increase in pain.     Clinical Presentation  Stable    Clinical Decision Making  Low    Rehab Potential  Excellent    PT Frequency  2x / week    PT Duration  4 weeks    PT Treatment/Interventions  Aquatic Therapy;Cryotherapy;Electrical Stimulation;Iontophoresis 72m/ml Dexamethasone;Moist  Heat;Functional mobility training;Stair training;Therapeutic activities;Therapeutic exercise;Balance training;Neuromuscular re-education;Patient/family education;Manual techniques;Dry needling;Passive range of motion;Taping;Spinal Manipulations;Joint Manipulations    PT Next Visit Plan  Assess possibility of dry needling and continued core strengthening.     PT Home Exercise Plan  Access Code: [email or web address](HLTR, supine piriformis stretch,  single knee to chest). Page 1 and 2 transverse abdominus core progression.     Consulted and Agree with Plan of Care  Patient       Patient will benefit from skilled therapeutic intervention in order to improve the following deficits and impairments:  Abnormal gait, Pain, Improper body mechanics, Decreased mobility, Increased muscle spasms, Postural dysfunction, Difficulty walking, Decreased strength, Decreased range of motion, Decreased activity tolerance  Visit Diagnosis: Acute low back pain, unspecified back pain laterality, unspecified whether sciatica present  Muscle weakness (generalized)  Abnormal posture     Problem List Patient Active Problem List   Diagnosis Date Noted  . ED (erectile dysfunction) of organic origin 02/21/2015  . Diabetes ([place]) 10/18/2014  . Hyperlipemia 10/18/2014    Florentina Jenny, SPT  This entire session was performed under direct supervision and direction of a licensed therapist/therapist assistant . I have personally read, edited and approve of the note as written.  Myles Gip PT, DPT [phone] 02/01/2018, 5:35 PM   [facility] [address] [place], [place], [address] Phone: [phone]   Fax:  [phone]  Name: Curtis Singh MRN: [id number] Date of Birth: 02-17-1955

## 2018-02-04 ENCOUNTER — Encounter: Payer: Self-pay | Admitting: Physical Therapy

## 2018-02-04 ENCOUNTER — Ambulatory Visit: Payer: PRIVATE HEALTH INSURANCE | Admitting: Physical Therapy

## 2018-02-04 VITALS — BP 142/81 | HR 62

## 2018-02-04 DIAGNOSIS — M545 Low back pain, unspecified: Secondary | ICD-10-CM

## 2018-02-04 DIAGNOSIS — M6281 Muscle weakness (generalized): Secondary | ICD-10-CM

## 2018-02-04 DIAGNOSIS — R293 Abnormal posture: Secondary | ICD-10-CM

## 2018-02-04 NOTE — Therapy (Signed)
[facility] [address] [place], [place], [address] Phone: [phone]   Fax:  [phone]  Physical Therapy Treatment  Patient Details  Name: Curtis Singh MRN: [id number] Date of Birth: 1955-04-10 Referring Provider (PT): Milagros Evener, FNP   Encounter Date: 02/04/2018  PT End of Session - 02/04/18 1912    Visit Number  6    Number of Visits  9    Date for PT Re-Evaluation  02/10/18    PT Start Time  1505    PT Stop Time  1443    PT Time Calculation (min)  59 min    Activity Tolerance  Patient limited by pain;Patient tolerated treatment well    Behavior During Therapy  [facility] for tasks assessed/performed       Past Medical History:  Diagnosis Date  . Diabetes ([place])   . ED (erectile dysfunction)   . 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 Hypertension   . Seasonal allergies     Past Surgical History:  Procedure Laterality Date  . CATARACT EXTRACTION W/PHACO Right 09/03/2016   Procedure: CATARACT EXTRACTION PHACO AND INTRAOCULAR LENS PLACEMENT ([place]) RIGHT DIABETIC;  Surgeon: Leandrew Koyanagi, MD;  Location: [place];  Service: Ophthalmology;  Laterality: Right;  DIABETIC  . ROTATOR CUFF REPAIR Right 2009    Vitals:   02/04/18 1911  BP: (!) 142/81  Pulse: 62  SpO2: 98%    Subjective Assessment - 02/04/18 1910    Subjective  Pt. states he woke up with back pain and discomfort has persisted t/o day with work-related tasks. He reported getting more sleep last night than usual and woke up with pain this morning. Pt. Currently at 7/10 on NPS today at beginning of session. Pt. Also noted dizziness initially when lying down on mat table.     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 As soon as he put the boxes down, he  felt pain his R low back which has spread across the whole low back, but R>L. Patient is currently not picking up anything >20lbs and is to avoid bending, twisting, lifting.    Limitations  Lifting;Standing;Walking;House hold activities;Sitting    Patient Stated Goals  return to full function at work; reduce pain; be able to ride his motorcycle in the spring    Currently in Pain?  Yes    Pain Score  7     Pain Location  Back    Pain Orientation  Right    Pain Descriptors / Indicators  Dull;Aching    Pain Type  Acute pain    Pain Onset  1 to 4 weeks ago    Multiple Pain Sites  No         Manual:   PROM of bilat hip in all planes of motion (Pt. Noted mild tightness with left sided piriformis stretch)  Lumbar trunk rotation with OP in supine position 2x10 Grade II-III UPA mobs atT12-53x30sec. Each in prone position (pt. Reported mild releif with mobs) Grade II-III CPA mobs at T12-5 3x30sec. Each in prone position STM to bilat lumbar paraspinals in prone position for19mn.(increased tenderness noted along right QL and glute med) Prone trigger point dry needling to R lumbar paraspinals/ superior gluteal musculature)- 6 needles used with good tx.  Tolerance (no increase c/o pain)- 1 muscle fasciculation noted in glut. Med.       PT Long Term Goals - 02/04/18 1931      PT LONG TERM GOAL #1   Title  Patient will be independent with HEP  in order to improve strength and decrease back pain in order to improve pain-free function at home and work.     Baseline  01/12/2018: HEP provided    Time  4    Period  Weeks    Status  Partially Met    Target Date  02/10/18      PT LONG TERM GOAL #2   Title  Patient will demonstrate improved function as evidenced by a FOTO score of > 62/100 in order to return to PLOF at home and work.    Baseline  01/12/2018: 50/100    Time  4    Period  Weeks    Status  On-going    Target Date  02/10/18      PT LONG TERM GOAL #3   Title  Patient will  demonstrate safe and proper lifting mechanics for boxes from below shoulder height with appropriate abdominal and leg strategies in order to return to his PLOF at work.    Baseline  01/12/2018: patient relies on hamstrings and back extensors    Time  4    Period  Weeks    Status  Partially Met    Target Date  02/10/18      PT LONG TERM GOAL #4   Title  Pt will decrease worst back pain as reported on NPRS by at least 2 points in order to demonstrate clinically significant reduction in back pain.     Baseline  01/12/2018: 8/10 worst    Time  4    Period  Weeks    Status  Not Met    Target Date  02/10/18            Plan - 02/04/18 1931    Clinical Impression Statement  Pt. Entered clinic today with significant increase in R low back pain. PT noted hypomobility of lumbar spinal movement during reassessment of spinal mobs. Pt. Also reported tenderness along right flank during active flexion. PT noted hypomobility during grade III-IV spinal mobilizations along T12-L5 of CPAs and UPAs. Pt. limited by pain that moved distally with right sided UPA's but experienced relief when PT let up to only grade I mobs.  (+) tightness of psoas/hip flexor on right hip compared to left when assessed with pt. In prone position. Mod lumbar paraspinal guarding was noted during STM of lumbar region.  Pt. Reported mild relief with STM and lumbar PAIVMs.  Pt. reported pain had decreased to 5/10 on NPS after PT treatment.  Pt. returns to MD to discuss status of low back and pt. is undergoing MRI for L shoulder on 02/05/18.       Clinical Presentation  Stable    Clinical Decision Making  Low    Rehab Potential  Excellent    PT Frequency  2x / week    PT Duration  4 weeks    PT Treatment/Interventions  Aquatic Therapy;Cryotherapy;Electrical Stimulation;Iontophoresis 70m/ml Dexamethasone;Moist Heat;Functional mobility training;Stair training;Therapeutic activities;Therapeutic exercise;Balance training;Neuromuscular  re-education;Patient/family education;Manual techniques;Dry needling;Passive range of motion;Taping;Spinal Manipulations;Joint Manipulations    PT Next Visit Plan  Discuss dry needling/ MD f/u.      PT Home Exercise Plan  Access Code: [email or web address](HLTR, supine piriformis stretch, single knee to chest). Page 1  and 2 transverse abdominus core progression.     Consulted and Agree with Plan of Care  Patient       Patient will benefit from skilled therapeutic intervention in order to improve the following deficits and impairments:  Abnormal gait, Pain, Improper body mechanics, Decreased mobility, Increased muscle spasms, Postural dysfunction, Difficulty walking, Decreased strength, Decreased range of motion, Decreased activity tolerance  Visit Diagnosis: Acute low back pain, unspecified back pain laterality, unspecified whether sciatica present  Muscle weakness (generalized)  Abnormal posture     Problem List Patient Active Problem List   Diagnosis Date Noted  . ED (erectile dysfunction) of organic origin 02/21/2015  . Diabetes ([place]) 10/18/2014  . Hyperlipemia 10/18/2014   Pura Spice, PT, DPT # [phone] Florentina Jenny, SPT 02/05/2018, 7:39 AM  [facility] [address] [place], [place], [address] Phone: [phone]   Fax:  [phone]  Name: VEARL ALLBAUGH MRN: [id number] Date of Birth: 05/13/1955

## 2018-02-05 ENCOUNTER — Ambulatory Visit
Admission: RE | Admit: 2018-02-05 | Discharge: 2018-02-05 | Disposition: A | Discharge status: Home / Self Care | Payer: No Typology Code available for payment source | Source: Ambulatory Visit | Attending: Orthopedic Surgery | Admitting: Orthopedic Surgery

## 2018-02-05 DIAGNOSIS — M25512 Pain in left shoulder: Secondary | ICD-10-CM | POA: Insufficient documentation

## 2018-02-09 ENCOUNTER — Ambulatory Visit: Payer: No Typology Code available for payment source | Admitting: Cardiovascular Disease

## 2018-02-09 ENCOUNTER — Encounter

## 2018-02-09 ENCOUNTER — Encounter: Payer: Self-pay | Admitting: Cardiovascular Disease

## 2018-02-09 VITALS — BP 132/70 | HR 65 | Ht 70.5 in | Wt 207.0 lb

## 2018-02-09 DIAGNOSIS — E785 Hyperlipidemia, unspecified: Secondary | ICD-10-CM

## 2018-02-09 DIAGNOSIS — R0602 Shortness of breath: Secondary | ICD-10-CM | POA: Diagnosis not present

## 2018-02-09 DIAGNOSIS — I700 Atherosclerosis of aorta: Secondary | ICD-10-CM

## 2018-02-09 DIAGNOSIS — I10 Essential (primary) hypertension: Secondary | ICD-10-CM | POA: Diagnosis not present

## 2018-02-09 MED ORDER — ROSUVASTATIN CALCIUM 40 MG PO TABS: 40.0000 mg | ORAL_TABLET | Freq: Every day | ORAL | 3 refills | Status: DC

## 2018-02-09 MED ORDER — ASPIRIN EC 81 MG PO TBEC: 81.0000 mg | DELAYED_RELEASE_TABLET | Freq: Every day | ORAL | 3 refills | Status: DC

## 2018-02-09 NOTE — Patient Instructions (Signed)
Medication Instructions:  STOP the Atorvastatin START Rousvastatin 40 mg daily START 81 mg Aspirin  If you need a refill on your cardiac medications before your next appointment, please call your pharmacy.   Lab work: Your provider would like for you to return in 6 weeks to have the following labs drawn: FASTING lipid and liver. Please go to the [facility] entrance and check in at the front desk. You do not need an appointment.   Testing/Procedures: Your physician has requested that you have an exercise tolerance test. For further information please visit [email or web address]. Please also follow instruction sheet, as given. This will take place at the [place] office.  Do not drink or eat foods with caffeine for 24 hours before the test. (Chocolate, coffee, tea, or energy drinks)  If you use an inhaler, bring it with you to the test.  Do not smoke for 4 hours before the test.  Wear comfortable shoes and clothing.   Follow-Up: At [facility], you and your health needs are our priority.  As part of our continuing mission to provide you with exceptional heart care, we have created designated Provider Care Teams.  These Care Teams include your primary Cardiologist (physician) and Advanced Practice Providers (APPs -  Physician Assistants and Nurse Practitioners) who all work together to provide you with the care you need, when you need it. You will need a follow up appointment in 12 months.  Please call our office 2 months in advance to schedule this appointment.  You may see [name], MD or one of the following Advanced Practice Providers on your designated Care Team:   Murray Hodgkins, NP Christell Faith, PA-C . Marrianne Mood, PA-C

## 2018-02-09 NOTE — Progress Notes (Signed)
Cardiology Office Note   Date:  02/09/2018   ID:  Curtis Singh, DOB 1955-02-24, MRN [id number]  PCP:  Cletis Athens, MD  Cardiologist:   Curtis Sacramento, MD  Chief Complaint  Patient presents with  . New Patient (Initial Visit)    Per Kendall Flack for CAD. Patient denies chest pain and SOB. Meds reviewed verbally with patient.       History of Present Illness: Curtis Singh is a 63 y.o. male who was referred by Dr. Lavera Singh for evaluation for aortic atherosclerosis noted on back x-ray.  The patient has no previous cardiac history but he has known history of diabetes mellitus currently on insulin.  He had this for about 14 years.  In addition, he has hyperlipidemia and mild hypertension.  He does have strong family history of coronary artery disease.  His father and his uncles died in their 59s from myocardial infarction.  The patient is not a smoker. He denies any chest pain, palpitations or leg edema.  He describes mild exertional dyspnea.  He had a routine back x-ray recently which showed evidence of aortic sclerosis.  He denies any leg claudication.    Past Medical History:  Diagnosis Date  . Diabetes ([place])   . ED (erectile dysfunction)   . 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 Hypertension   . Seasonal allergies     Past Surgical History:  Procedure Laterality Date  . CATARACT EXTRACTION W/PHACO Right 09/03/2016   Procedure: CATARACT EXTRACTION PHACO AND INTRAOCULAR LENS PLACEMENT ([place]) RIGHT DIABETIC;  Surgeon: Leandrew Koyanagi, MD;  Location: [place];  Service: Ophthalmology;  Laterality: Right;  DIABETIC  . ROTATOR CUFF REPAIR Right 2009     Current Outpatient Medications  Medication Sig Dispense Refill  . Dexlansoprazole (DEXILANT PO) Take by mouth.    Marland Kitchen ibuprofen (ADVIL,MOTRIN) 800 MG tablet Take 800 mg by mouth every 8 (eight) hours as needed.    . insulin detemir (LEVEMIR) 100 UNIT/ML injection Inject 12 Units into the skin every morning.      Marland Kitchen losartan (COZAAR) 50 MG tablet Take 50 mg by mouth daily.    . metFORMIN (GLUCOPHAGE) 500 MG tablet Take by mouth 2 (two) times daily with a meal.    . NONFORMULARY OR COMPOUNDED ITEM Trimix (30/1/10)-(Pap/Phent/PGE) Dosage: Inject 1.0 cc per injection Prefilled syringe : 10 syringes Qty10 [facility] [phone] Fax [phone] 10 each 6  . zolpidem (AMBIEN) 10 MG tablet Take 10 mg by mouth at bedtime as needed for sleep.     No current facility-administered medications for this visit.     Allergies:   Patient has no known allergies.    Social History:  The patient  reports that he quit smoking about 36 years ago. He has never used smokeless tobacco. He reports current alcohol use of about 12.0 - 18.0 standard drinks of alcohol per week. He reports that he does not use drugs.   Family History:  The patient's family history includes Heart attack in his father, maternal uncle, maternal uncle, maternal uncle, and maternal uncle.    ROS:  Please see the history of present illness.   Otherwise, review of systems are positive for none.   All other systems are reviewed and negative.    PHYSICAL EXAM: VS:  BP 132/70 (BP Location: Left Arm, Patient Position: Sitting, Cuff Size: Normal)   Pulse 65   Ht 5' 10.5" (1.791 m)   Wt 207  lb (93.9 kg)   BMI 29.28 kg/m  , BMI Body mass index is 29.28 kg/m. GEN: Well nourished, well developed, in no acute distress  HEENT: normal  Neck: no JVD, carotid bruits, or masses Cardiac: RRR; no  rubs, or gallops,no edema .  1 out of 6 systolic murmur in the aortic area Respiratory:  clear to auscultation bilaterally, normal work of breathing GI: soft, nontender, nondistended, + BS MS: no deformity or atrophy  Skin: warm and dry, no rash Neuro:  Strength and sensation are intact Psych: euthymic mood, full affect   EKG:  EKG is ordered today. The ekg ordered today demonstrates normal sinus rhythm with no significant ST or T  wave changes.   Recent Labs: 06/18/2017: ALT 31; BUN 19; Creatinine, Ser 0.84; Hemoglobin 15.4; Platelets 287; Potassium 4.3; Sodium 139; TSH 0.931    Lipid Panel    Component Value Date/Time   CHOL 241 (H) 06/18/2017 0810   CHOL 239 (H) 01/20/2014 0729   TRIG 149 06/18/2017 0810   TRIG 171 01/20/2014 0729   HDL 53 06/18/2017 0810   HDL 48 01/20/2014 0729   CHOLHDL 4.5 06/18/2017 0810   VLDL 30 06/18/2017 0810   VLDL 34 01/20/2014 0729   LDLCALC 158 (H) 06/18/2017 0810   LDLCALC 157 (H) 01/20/2014 0729      Wt Readings from Last 3 Encounters:  02/09/18 207 lb (93.9 kg)  09/22/16 197 lb 9.6 oz (89.6 kg)  09/03/16 195 lb (88.5 kg)        ASSESSMENT AND PLAN:  1.  Aortic atherosclerosis: The patient has evidence of atherosclerosis and he has multiple risk factors for coronary artery disease including prolonged history of diabetes mellitus.  His symptoms include mild exertional shortness of breath with no chest pain. I do recommend screening with a treadmill stress test especially with his strong family history. He has no symptoms of claudication. I advised him to start taking aspirin 81 mg once daily. I discussed with the patient the importance of lifestyle changes in order to decrease the chance of future coronary artery disease and cardiovascular events. We discussed the importance of controlling risk factors, healthy diet as well as regular exercise.  2.  Hyperlipidemia: I reviewed lipid profile with him which was done in June.  It showed an LDL of 158.  This is in spite of treatment with atorvastatin.  Given his diabetes status and presence of atherosclerosis, I recommend a target LDL of less than 70.  Thus, I elected to switch him to rosuvastatin 40 mg once daily.  I also discussed with him the importance of healthy diet.  Check lipid and liver profile in 6 weeks.  3.  Essential hypertension: Blood pressures controlled   No orders of the defined types were placed in  this encounter.    Disposition:   FU with me in 1 year  Signed, Curtis Sacramento, MD 02/09/2018 [place]

## 2018-02-15 ENCOUNTER — Telehealth: Payer: Self-pay | Admitting: *Deleted

## 2018-02-15 NOTE — Telephone Encounter (Signed)
Patient called and verbalized understanding of stress test appointment date and time tomorrow. He also is aware to follow instructions as listed on his AVS about caffeine, smoking, and shoes.

## 2018-02-17 ENCOUNTER — Encounter: Payer: Self-pay | Admitting: Physical Therapy

## 2018-02-18 ENCOUNTER — Encounter: Payer: Self-pay | Admitting: Physical Therapy

## 2018-02-18 ENCOUNTER — Ambulatory Visit: Payer: PRIVATE HEALTH INSURANCE | Attending: Family Medicine | Admitting: Physical Therapy

## 2018-02-18 DIAGNOSIS — M545 Low back pain, unspecified: Secondary | ICD-10-CM

## 2018-02-18 DIAGNOSIS — M6281 Muscle weakness (generalized): Secondary | ICD-10-CM | POA: Diagnosis present

## 2018-02-18 DIAGNOSIS — R293 Abnormal posture: Secondary | ICD-10-CM | POA: Diagnosis present

## 2018-02-18 NOTE — Therapy (Signed)
[facility] [address] [place], [place], [address] Phone: [phone]   Fax:  [phone]  Physical Therapy Treatment  Patient Details  Name: Curtis Singh MRN: [id number] Date of Birth: Mar 16, 1955 Referring Provider (PT): Milagros Evener, FNP   Encounter Date: 02/18/2018  PT End of Session - 02/18/18 1632    Visit Number  7    Number of Visits  9    Date for PT Re-Evaluation  02/24/18    PT Start Time  7948    PT Stop Time  1625    PT Time Calculation (min)  54 min    Activity Tolerance  Patient limited by pain;Patient tolerated treatment well    Behavior During Therapy  [facility] for tasks assessed/performed       Past Medical History:  Diagnosis Date  . Diabetes ([place])   . ED (erectile dysfunction)   . 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 Hypertension   . Seasonal allergies     Past Surgical History:  Procedure Laterality Date  . CATARACT EXTRACTION W/PHACO Right 09/03/2016   Procedure: CATARACT EXTRACTION PHACO AND INTRAOCULAR LENS PLACEMENT ([place]) RIGHT DIABETIC;  Surgeon: Leandrew Koyanagi, MD;  Location: [facility];  Service: Ophthalmology;  Laterality: Right;  DIABETIC  . ROTATOR CUFF REPAIR Right 2009    There were no vitals filed for this visit.  Subjective Assessment - 02/19/18 0805    Subjective  Pt. Entered clinic today with increase in pain today 7/10 NPS. Pt. Noted that his pain has been bad since Monday (02/15/18). Pt. Reported using heat and ibuprofen to manage pain. Pt. Is also having rotator cuff surgery next Thursday (02/25/18).    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 As soon as he put the boxes down, he felt pain his R low back which has spread across the whole low back, but R>L. Patient is  currently not picking up anything >20lbs and is to avoid bending, twisting, lifting.    Limitations  Lifting;Standing;Walking;House hold activities;Sitting    Patient Stated Goals  return to full function at work; reduce pain; be able to ride his motorcycle in the spring    Currently in Pain?  Yes    Pain Score  7     Pain Location  Back    Pain Orientation  Right    Pain Descriptors / Indicators  Aching;Dull    Pain Onset  1 to 4 weeks ago        Manual:   PROM of bilat hip in all planes of motion (Pt. Noted mild tightness with left sided piriformis stretch)  Straight leg hamstring stretch 1x30 sec (PT noted tight hamstring on right leg)     Lumbar trunk rotation with OP in supine position 2x10 3 sec hold. (Pt. Hypomobile with left rotation)  Grade II-III UPA mobs atT12-52x30sec. Each in prone position(pt. Reported mild releif with mobs) Grade II-III CPA mobs at T12-52x30sec. Each in prone position STM to bilat lumbar paraspinals in prone position for82mn.(trigger point muscle adheresions noted along right QL/glute med)   Prone trigger point dry needling to R lumbar paraspinals/ superior gluteal musculature- 3 needles used with good tx. Tolerance (no increase c/o pain)- 2 muscle fasciculation noted in glut. Med.  PT Long Term Goals - 02/04/18 1931      PT LONG TERM GOAL #1   Title  Patient will be independent with HEP  in order to improve strength and decrease back pain in order to improve pain-free function at home and work.     Baseline  01/12/2018: HEP provided    Time  4    Period  Weeks    Status  Partially Met    Target Date  02/10/18      PT LONG TERM GOAL #2   Title  Patient will demonstrate improved function as evidenced by a FOTO score of > 62/100 in order to return to PLOF at home and work.    Baseline  01/12/2018: 50/100    Time  4    Period  Weeks    Status  On-going    Target Date  02/10/18      PT LONG TERM GOAL #3   Title  Patient will  demonstrate safe and proper lifting mechanics for boxes from below shoulder height with appropriate abdominal and leg strategies in order to return to his PLOF at work.    Baseline  01/12/2018: patient relies on hamstrings and back extensors    Time  4    Period  Weeks    Status  Partially Met    Target Date  02/10/18      PT LONG TERM GOAL #4   Title  Pt will decrease worst back pain as reported on NPRS by at least 2 points in order to demonstrate clinically significant reduction in back pain.     Baseline  01/12/2018: 8/10 worst    Time  4    Period  Weeks    Status  Not Met    Target Date  02/10/18         Plan - 02/19/18 3875    Clinical Impression Statement  Pt. entered clinic today again with increase in LBP. Pt. Had significant increase in pain with flexion and extension when assessed and complained of pain moving across to his left side with repeated movement. Pt. Also complained of pain radiating down his right glute with right sided UPA along L3-L4. PT assessed bilat straight leg raise to assess for radicular symptoms followed by supine nerve glides but with no improvement or change in Sx.  PT noted hypomobility during CPA's and UPAs from T12-L5. Pt. Reported mild relief from STM and mobilizations, and dry needling along right QL and glute med. Pt. Reported 4/10 on NPS after treatment. Pt. Pt. Will continue to benefit form skilled PT interventions to decrease pain and get back to full load at work.     Clinical Presentation  Stable    Clinical Decision Making  Low    Rehab Potential  Excellent    PT Frequency  2x / week    PT Duration  2 weeks    PT Treatment/Interventions  Aquatic Therapy;Cryotherapy;Electrical Stimulation;Iontophoresis '4mg'$ /ml Dexamethasone;Moist Heat;Functional mobility training;Stair training;Therapeutic activities;Therapeutic exercise;Balance training;Neuromuscular re-education;Patient/family education;Manual techniques;Dry needling;Passive range of  motion;Taping;Spinal Manipulations;Joint Manipulations    PT Next Visit Plan  Continue with manual therapy to decrease pain to progress to more therex.     PT Home Exercise Plan  Access Code: [email or web address] (HLTR, supine piriformis stretch, single knee to chest). Page 1 and 2 transverse abdominus core progression.     Consulted and Agree with Plan of Care  Patient      Visit Diagnosis: Acute low back pain, unspecified back pain  laterality, unspecified whether sciatica present  Muscle weakness (generalized)  Abnormal posture     Problem List Patient Active Problem List   Diagnosis Date Noted  . ED (erectile dysfunction) of organic origin 02/21/2015  . Diabetes ([place]) 10/18/2014  . Hyperlipemia 10/18/2014   Pura Spice, PT, DPT # [phone] Florentina Jenny, SPT 02/19/2018, 1:39 PM  [facility] [address] [place], [place], [address] Phone: [phone]   Fax:  [phone]  Name: Curtis Singh MRN: [id number] Date of Birth: 01-11-1955

## 2018-02-19 ENCOUNTER — Telehealth: Payer: Self-pay | Admitting: Cardiovascular Disease

## 2018-02-19 ENCOUNTER — Other Ambulatory Visit: Payer: Self-pay | Admitting: Orthopedic Surgery

## 2018-02-19 ENCOUNTER — Encounter: Payer: Self-pay | Admitting: Physical Therapy

## 2018-02-19 NOTE — Telephone Encounter (Signed)
Spoke with patient and confirmed upcoming stress test and reviewed instructions with him and he verbalized understanding with no further questions at this time.

## 2018-02-22 ENCOUNTER — Ambulatory Visit: Payer: No Typology Code available for payment source

## 2018-02-23 ENCOUNTER — Ambulatory Visit: Payer: PRIVATE HEALTH INSURANCE | Admitting: Physical Therapy

## 2018-02-23 ENCOUNTER — Encounter (HOSPITAL_COMMUNITY): Payer: Self-pay

## 2018-02-23 ENCOUNTER — Encounter (HOSPITAL_COMMUNITY)
Admission: RE | Admit: 2018-02-23 | Discharge: 2018-02-23 | Disposition: A | Discharge status: Home / Self Care | Payer: No Typology Code available for payment source | Source: Ambulatory Visit | Attending: Orthopedic Surgery | Admitting: Orthopedic Surgery

## 2018-02-23 ENCOUNTER — Other Ambulatory Visit: Payer: Self-pay

## 2018-02-23 ENCOUNTER — Telehealth: Payer: Self-pay | Admitting: Cardiovascular Disease

## 2018-02-23 DIAGNOSIS — Z7982 Long term (current) use of aspirin: Secondary | ICD-10-CM | POA: Diagnosis not present

## 2018-02-23 DIAGNOSIS — M75122 Complete rotator cuff tear or rupture of left shoulder, not specified as traumatic: Secondary | ICD-10-CM | POA: Diagnosis present

## 2018-02-23 DIAGNOSIS — Z87891 Personal history of nicotine dependence: Secondary | ICD-10-CM | POA: Diagnosis not present

## 2018-02-23 DIAGNOSIS — M25812 Other specified joint disorders, left shoulder: Secondary | ICD-10-CM | POA: Diagnosis not present

## 2018-02-23 DIAGNOSIS — Z79899 Other long term (current) drug therapy: Secondary | ICD-10-CM | POA: Diagnosis not present

## 2018-02-23 DIAGNOSIS — Z01812 Encounter for preprocedural laboratory examination: Secondary | ICD-10-CM | POA: Insufficient documentation

## 2018-02-23 DIAGNOSIS — Z794 Long term (current) use of insulin: Secondary | ICD-10-CM | POA: Diagnosis not present

## 2018-02-23 DIAGNOSIS — E119 Type 2 diabetes mellitus without complications: Secondary | ICD-10-CM | POA: Diagnosis not present

## 2018-02-23 DIAGNOSIS — I10 Essential (primary) hypertension: Secondary | ICD-10-CM | POA: Diagnosis not present

## 2018-02-23 DIAGNOSIS — K219 Gastro-esophageal reflux disease without esophagitis: Secondary | ICD-10-CM | POA: Diagnosis not present

## 2018-02-23 HISTORY — DX: Gastro-esophageal reflux disease without esophagitis: K21.9

## 2018-02-23 LAB — CBC
HEMATOCRIT: 43.4 % (ref 39.0–52.0)
HEMOGLOBIN: 14.1 g/dL (ref 13.0–17.0)
MCH: 31.1 pg (ref 26.0–34.0)
MCHC: 32.5 g/dL (ref 30.0–36.0)
MCV: 95.8 fL (ref 80.0–100.0)
Platelets: 304 10*3/uL (ref 150–400)
RBC: 4.53 MIL/uL (ref 4.22–5.81)
RDW: 11.9 % (ref 11.5–15.5)
WBC: 5.9 10*3/uL (ref 4.0–10.5)
nRBC: 0 % (ref 0.0–0.2)

## 2018-02-23 LAB — BASIC METABOLIC PANEL
Anion gap: 7 (ref 5–15)
BUN: 17 mg/dL (ref 6–20)
CHLORIDE: 107 mmol/L (ref 98–111)
CO2: 26 mmol/L (ref 22–32)
Calcium: 8.9 mg/dL (ref 8.9–10.3)
Creatinine, Ser: 0.79 mg/dL (ref 0.61–1.24)
GFR calc Af Amer: >60 mL/min (ref >60)
GFR calc non Af Amer: >60 mL/min (ref >60)
Glucose, Bld: 187 mg/dL — ABNORMAL HIGH (ref 70–99)
POTASSIUM: 4.2 mmol/L (ref 3.5–5.1)
Sodium: 140 mmol/L (ref 135–145)

## 2018-02-23 NOTE — Patient Instructions (Signed)
ILIAN WESSELL  02/23/2018   Your procedure is scheduled on: 02/25/2018  Report to [facility] Main  Entrance  Report to admitting at    1045 AM    Call this number if you have problems the morning of surgery [phone]   Remember: Do not eat food or drink liquids :After Midnight. BRUSH YOUR TEETH MORNING OF SURGERY AND RINSE YOUR MOUTH OUT, NO CHEWING GUM CANDY OR MINTS.     Take these medicines the morning of surgery with A SIP OF WATER:  DO NOT TAKE ANY DIABETIC MEDICATIONS DAY OF YOUR SURGERY                               You may not have any metal on your body including hair pins and              piercings  Do not wear jewelry, , lotions, powders or perfumes, deodorant                          Men may shave face and neck.   Do not bring valuables to the hospital. [place].  Contacts, dentures or bridgework may not be worn into surgery.  Leave suitcase in the car. After surgery it may be brought to your room.     Patients discharged the day of surgery will not be allowed to drive home. IF YOU ARE HAVING SURGERY AND GOING HOME THE SAME DAY, YOU MUST HAVE AN ADULT TO DRIVE YOU HOME AND BE WITH YOU FOR 24 HOURS. YOU MAY GO HOME BY TAXI OR UBER OR ORTHERWISE, BUT AN ADULT MUST ACCOMPANY YOU HOME AND STAY WITH YOU FOR 24 HOURS.  Name and phone number of your driver:                Please read over the following fact sheets you were given: _____________________________________________________________________             [facility] - Preparing for Surgery Before surgery, you can play an important role.  Because skin is not sterile, your skin needs to be as free of germs as possible.  You can reduce the number of germs on your skin by washing with CHG (chlorahexidine gluconate) soap before surgery.  CHG is an antiseptic cleaner which kills germs and bonds with the skin to continue killing germs even after  washing. Please DO NOT use if you have an allergy to CHG or antibacterial soaps.  If your skin becomes reddened/irritated stop using the CHG and inform your nurse when you arrive at Short Stay. Do not shave (including legs and underarms) for at least 48 hours prior to the first CHG shower.  You may shave your face/neck. Please follow these instructions carefully:  1.  Shower with CHG Soap the night before surgery and the  morning of Surgery.  2.  If you choose to wash your hair, wash your hair first as usual with your  normal  shampoo.  3.  After you shampoo, rinse your hair and body thoroughly to remove the  shampoo.  4.  Use CHG as you would any other liquid soap.  You can apply chg directly  to the skin and wash                       Gently with a scrungie or clean washcloth.  5.  Apply the CHG Soap to your body ONLY FROM THE NECK DOWN.   Do not use on face/ open                           Wound or open sores. Avoid contact with eyes, ears mouth and genitals (private parts).                       Wash face,  Genitals (private parts) with your normal soap.             6.  Wash thoroughly, paying special attention to the area where your surgery  will be performed.  7.  Thoroughly rinse your body with warm water from the neck down.  8.  DO NOT shower/wash with your normal soap after using and rinsing off  the CHG Soap.                9.  Pat yourself dry with a clean towel.            10.  Wear clean pajamas.            11.  Place clean sheets on your bed the night of your first shower and do not  sleep with pets. Day of Surgery : Do not apply any lotions/deodorants the morning of surgery.  Please wear clean clothes to the hospital/surgery center.  FAILURE TO FOLLOW THESE INSTRUCTIONS MAY RESULT IN THE CANCELLATION OF YOUR SURGERY PATIENT SIGNATURE_________________________________  NURSE  SIGNATURE__________________________________  ________________________________________________________________________

## 2018-02-23 NOTE — Telephone Encounter (Signed)
   Primary Cardiologist: [name], MD  Chart reviewed as part of pre-operative protocol coverage. Patient was recently seen by Dr. Fletcher Anon to establish cardiac care 02/09/18. Pending stress test prior to clearance. Please let surgeon office know about need of testing. Will also route to Dr. Fletcher Anon for review.   [place], [place] 02/23/2018, 1:39 PM

## 2018-02-23 NOTE — Progress Notes (Signed)
Need orders in epic.  Preop at 2pm on 02/23/2018.

## 2018-02-23 NOTE — Progress Notes (Addendum)
EKG-02/09/2018-epic LOV-Dr Arida-02/09/2018-epic-cardiology Patient was to have stress test on 02/22/2018 but machine broke down per patient.  Not yet been rescheduled.   Patient denies any chestpain or shortness of breath at preop appt.   Patient's father died in 16s from MI. Other male family members have dies in 59s from MI.   Reported above to Konrad Felix, Anesthesia PA.  Konrad Felix has requested clearance from cardiology for surgery on 02/25/18.

## 2018-02-23 NOTE — Telephone Encounter (Signed)
Routed via [employer], to requesting surgeon, Dr. Bettina Gavia office to update them on the status of the surgical clearance. jw 02/23/2018

## 2018-02-23 NOTE — Telephone Encounter (Signed)
° °  [facility] Pre-operative Risk Assessment    Request for surgical clearance:  1. What type of surgery is being performed?  L Authroscopic Rotator Cuff Repair subacromial decompression   2. When is this surgery scheduled? 02/25/18   3. What type of clearance is required (medical clearance vs. Pharmacy clearance to hold med vs. Both)? both  4. Are there any medications that need to be held prior to surgery and how long? Please advise   5. Practice name and name of physician performing surgery? [facility] Dr. Tamera Punt   6. What is your office phone number [phone]    7.   What is your office fax number [phone]  8.   Anesthesia type (None, local, MAC, general) ? choice   Clarisse Gouge 02/23/2018, 1:07 PM  _________________________________________________________________   (provider comments below)

## 2018-02-24 ENCOUNTER — Ambulatory Visit: Payer: PRIVATE HEALTH INSURANCE | Attending: Family Medicine

## 2018-02-24 DIAGNOSIS — M545 Low back pain, unspecified: Secondary | ICD-10-CM

## 2018-02-24 DIAGNOSIS — R293 Abnormal posture: Secondary | ICD-10-CM | POA: Diagnosis present

## 2018-02-24 DIAGNOSIS — M6281 Muscle weakness (generalized): Secondary | ICD-10-CM | POA: Insufficient documentation

## 2018-02-24 LAB — HEMOGLOBIN A1C
Hgb A1c MFr Bld: 7.5 % — ABNORMAL HIGH (ref 4.8–5.6)
Mean Plasma Glucose: 169 mg/dL

## 2018-02-24 NOTE — Telephone Encounter (Signed)
Pt would like to discuss his clearance for his shoulder surgery. He states he is supposed to have his surgery tomorrow, and was here on Monday to perform his stress test, but unable to due to treadmill malfunction. Please call to discuss.

## 2018-02-24 NOTE — Telephone Encounter (Signed)
He is at low risk for surgery. The stress test can be done after surgery.

## 2018-02-24 NOTE — Anesthesia Preprocedure Evaluation (Addendum)
Anesthesia Evaluation  Patient identified by MRN, date of birth, ID band Patient awake    Reviewed: Allergy & Precautions, NPO status , Patient's Chart, lab work & pertinent test results  Airway Mallampati: II  TM Distance: >3 FB Neck ROM: Full    Dental no notable dental hx.    Pulmonary neg pulmonary ROS, former smoker,    Pulmonary exam normal breath sounds clear to auscultation       Cardiovascular hypertension, Normal cardiovascular exam Rhythm:Regular Rate:Normal     Neuro/Psych negative neurological ROS  negative psych ROS   GI/Hepatic negative GI ROS, Neg liver ROS,   Endo/Other  diabetes  Renal/GU negative Renal ROS  negative genitourinary   Musculoskeletal negative musculoskeletal ROS (+)   Abdominal   Peds negative pediatric ROS (+)  Hematology negative hematology ROS (+)   Anesthesia Other Findings   Reproductive/Obstetrics negative OB ROS                            Anesthesia Physical Anesthesia Plan  ASA: II  Anesthesia Plan: General   Post-op Pain Management:  Regional for Post-op pain   Induction: Intravenous  PONV Risk Score and Plan: 2 and Ondansetron, Dexamethasone and Treatment may vary due to age or medical condition  Airway Management Planned: Oral ETT  Additional Equipment:   Intra-op Plan:   Post-operative Plan: Extubation in OR  Informed Consent: I have reviewed the patients History and Physical, chart, labs and discussed the procedure including the risks, benefits and alternatives for the proposed anesthesia with the patient or authorized representative who has indicated his/her understanding and acceptance.     Dental advisory given  Plan Discussed with: CRNA and Surgeon  Anesthesia Plan Comments: (See PAT note 02/23/18, Konrad Felix, PA-C)       Anesthesia Quick Evaluation

## 2018-02-24 NOTE — Telephone Encounter (Signed)
The patient called back concerned about his surgery that is scheduled for tomorrow. He stated that they will not do the surgery unless he is cleared by cardiology.  The clearance was pending the results of his GXT. According to the patient he came for the test on Monday and was told the machine was down. He waited an hour and a half to see if the machine would be fixed.   Message routed to the provider

## 2018-02-24 NOTE — Telephone Encounter (Signed)
The patient and Colletta Maryland at [facility] have been made aware.  Clearance has been faxed to [phone]

## 2018-02-24 NOTE — Patient Instructions (Signed)
Pt instructed to continue with HEP. Added standing extension as tolerated, instructed to stop if it increases or causes peripheralization of symptoms Use of tennis ball for soft tissue mobilizations at home as well.

## 2018-02-24 NOTE — Therapy (Signed)
[facility] [address] [place], [place], [address] Phone: [phone]   Fax:  [phone]  Physical Therapy Treatment and Discharge Note   Patient Details  Name: Curtis Singh MRN: [id number] Date of Birth: 16-Apr-1955 Referring Provider (PT): Milagros Evener, FNP   Encounter Date: 02/24/2018  PT End of Session - 02/24/18 1632    Visit Number  8    Number of Visits  9    Date for PT Re-Evaluation  03/04/18    PT Start Time  9826    PT Stop Time  1630    PT Time Calculation (min)  56 min    Activity Tolerance  Patient limited by pain    Behavior During Therapy  [facility] for tasks assessed/performed       Past Medical History:  Diagnosis Date  . 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 ED (erectile dysfunction)   . GERD (gastroesophageal reflux disease)   . 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 Hypertension   . Seasonal allergies     Past Surgical History:  Procedure Laterality Date  . CATARACT EXTRACTION W/PHACO Right 09/03/2016   Procedure: CATARACT EXTRACTION PHACO AND INTRAOCULAR LENS PLACEMENT ([place]) RIGHT DIABETIC;  Surgeon: Leandrew Koyanagi, MD;  Location: [place];  Service: Ophthalmology;  Laterality: Right;  DIABETIC  . ROTATOR CUFF REPAIR Right 2009    There were no vitals filed for this visit.  Subjective Assessment - 02/24/18 1535    Subjective  Patient entered clinic today, expressed his frustration with lack of progress with pain management. Patient still has significant pain, especially last night. Patient has rotator cuff surgery tomorrow. Reported that it was both his R and L low back hurt last night.  This morning his pain was 8/10.    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 He was moving boxes while working and he picked 2 boxes up from roughly waist/chest height and rotated L with the boxes to set them down. As soon as he put the  boxes down, he felt pain his R low back which has spread across the whole low back, but R>L. Patient is currently not picking up anything >20lbs and is to avoid bending, twisting, lifting.    Limitations  Lifting;Standing;Walking;House hold activities;Sitting    Patient Stated Goals  return to full function at work; reduce pain; be able to ride his motorcycle in the spring    Currently in Pain?  Yes    Pain Score  6     Pain Location  Back    Pain Orientation  Right    Pain Type  Acute pain    Pain Onset  1 to 4 weeks ago        Objective:   Palpation R lumbar paraspinals have increased tone and are tender to palpation with greatest tenderness near L4/5 region. R QL has increased tenderness to palpation greatest near the iliac crest.  Strength (out of 5) R/L 5/4+Hip flexion; 5/4+ Hip ER 5/5Hip IR 5/5 Hip abduction (seated) 5/5 Hip adduction (seated) 5/5 Knee extension 5/5 Knee flexion 5/5Ankle dorsiflexion *Indicates pain  AROM (degrees) R/L (all movements include overpressure unless otherwise stated) Lumbar forward flexion (0-65): WFL * pain at end range Lumbar extension (0-30): WFL * pain at end range Lumbar lateral flexion (0-25): R:WFL  L: WFL*  Lumbar rotation: WFL, pain with R rotation Hip  IR (0-45): B: WFL Hip ER (0-45): B: WFL Hip Flexion (0-125): WFL Hip Abduction (0-40): B: WFL Hip extension (0-15): B: WFL *Indicates pain  PROM (degrees) PROM = AROM *Indicates pain   TREATMENT:  therapeutic exercises: Semi-prone leg lifts (instability lumbar spine special test position) x10 - pt reported improvement in symptoms Standing lumbar extension with cues to avoid cervical extension  x10, x10 first step with cues to improve spinal mobility second set to decrease muscle activation. Pt reported increased pain in tender spot after standing extensions  Manual therapy: Educated on use of tennis ball at home for self soft tissue mobilization Superficial and  deep techniques utilized, pt reported concordant pain with trigger point in superior glute max fibers, lumbar paraspinals (on the R)      PT Education - 02/24/18 1543    Education Details  HEP, prognosis, manual tecniques    Person(s) Educated  Patient    Methods  Explanation;Demonstration    Comprehension  Verbalized understanding          PT Long Term Goals - 02/24/18 1546      PT LONG TERM GOAL #1   Title  Patient will be independent with HEP  in order to improve strength and decrease back pain in order to improve pain-free function at home and work.     Baseline  01/12/2018: HEP provided. 2/19 pt reports performing his HEP at home    Time  2    Period  Weeks    Status  Achieved      PT LONG TERM GOAL #2   Title  Patient will demonstrate improved function as evidenced by a FOTO score of > 62/100 in order to return to PLOF at home and work.    Baseline  01/12/2018: 50/100    Status  Not Met      PT LONG TERM GOAL #3   Title  Patient will demonstrate safe and proper lifting mechanics for boxes from below shoulder height with appropriate abdominal and leg strategies in order to return to his PLOF at work.    Baseline  01/12/2018: patient relies on hamstrings and back extensors, deferred today due to increased back pain as well as shoulder pain    Time  2    Period  Weeks    Status  Deferred    Target Date  03/04/18      PT LONG TERM GOAL #4   Title  Pt will decrease worst back pain as reported on NPRS by at least 2 points in order to demonstrate clinically significant reduction in back pain.     Baseline  01/12/2018: 8/10 worst; 8/10    Time  2    Period  Weeks    Status  Not Met    Target Date  02/24/18            Plan - 02/24/18 1629    Clinical Impression Statement  Patient agreeable to discharge at this time due to lack in progress of lumbar spine pain, as well as scheduled rotator cuff repair. Overall the patient demonstrated improved strength and mild improvements  in mild ROM, though still painful with certain lumbar ROM. Patient still has significant tenderness and trigger point to L lumbar paraspinals, superior glute max fibers that reproduced concordant pain. PT, SPT, and pt discussed HEP, expectations, as well as returning to physician to address unchanged pain symptoms, pt agreeable.     Rehab Potential  Excellent    PT Frequency  2x / week    PT Duration  2 weeks    PT Treatment/Interventions  Aquatic Therapy;Cryotherapy;Electrical Stimulation;Iontophoresis 68m/ml Dexamethasone;Moist Heat;Functional mobility training;Stair training;Therapeutic activities;Therapeutic exercise;Balance training;Neuromuscular re-education;Patient/family education;Manual techniques;Dry needling;Passive range of motion;Taping;Spinal Manipulations;Joint Manipulations    PT Next Visit Plan  discharge    Consulted and Agree with Plan of Care  Patient       Patient will benefit from skilled therapeutic intervention in order to improve the following deficits and impairments:  Abnormal gait, Pain, Improper body mechanics, Decreased mobility, Increased muscle spasms, Postural dysfunction, Difficulty walking, Decreased strength, Decreased range of motion, Decreased activity tolerance  Visit Diagnosis: Acute low back pain, unspecified back pain laterality, unspecified whether sciatica present  Muscle weakness (generalized)  Abnormal posture     Problem List Patient Active Problem List   Diagnosis Date Noted  . ED (erectile dysfunction) of organic origin 02/21/2015  . Diabetes ([place] 10/18/2014  . Hyperlipemia 10/18/2014    DLieutenant DiegoPT, DPT 4:52 PM,02/24/18 [id number]  This entire session was performed under direct supervision and direction of a licensed therapist/therapist assistant . I have personally read, edited and approve of the note as written.  [facility] [place]  [address]Phone: [phone]  Fax:  [phone] Name: GSHINE MIKESMRN: [id number] of Birth: [birth year]

## 2018-02-24 NOTE — Progress Notes (Signed)
Anesthesia Chart Review   Case:  [id number] Date/Time:  02/25/18 1230   Procedure:  LEFT ARTHROSCOPIC ROTATOR CUFF REPAIR; SUBACROMIAL DECOMPRESSION (Left )   Anesthesia type:  Choice   Pre-op diagnosis:  LEFT SHOULDER ROTATOR CUFF TEAR   Location:  [place] [facility]   Surgeon:  Tania Ade, MD      DISCUSSION: 63 yo former smoker (quit 01/06/82) with h/o DM II, HTN, GERD, left shoulder rotator cuff tear scheduled for above procedure 02/25/18 with Dr. Tania Ade.   Pt seen by cardiologist, Dr. [name], on 02/09/2018.  Pt asymptomatic.  Due to strong family history and multiple risk factors including DM II a stress test was ordered.  Per Dr. Fletcher Anon, "He is at low risk for surgery.  The stress test can be done after surgery."  Pt can proceed with planned procedure barring acute status change.  VS: BP (!) 144/63 (BP Location: Right Arm)   Pulse 78   Temp 36.9 C (Oral)   Resp 18   Ht 5' 10.5" (1.791 m)   SpO2 98%   BMI 29.28 kg/m   PROVIDERS: Cletis Athens, MD is PCP   [name], MD is Cardiologist  LABS: Labs reviewed: Acceptable for surgery. (all labs ordered are listed, but only abnormal results are displayed)  Labs Reviewed  BASIC METABOLIC PANEL - Abnormal; Notable for the following components:      Result Value   Glucose, Bld 187 (*)    All other components within normal limits  HEMOGLOBIN A1C - Abnormal; Notable for the following components:   Hgb A1c MFr Bld 7.5 (*)    All other components within normal limits  CBC     IMAGES:   EKG: 02/09/2018 Rate 65 bpm Normal sinus rhythm  Normal ECG   CV:  Past Medical History:  Diagnosis Date  . 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 ED (erectile dysfunction)   . GERD (gastroesophageal reflux disease)   . 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 Hypertension   . Seasonal allergies     Past Surgical History:  Procedure Laterality Date  . CATARACT EXTRACTION W/PHACO Right 09/03/2016    Procedure: CATARACT EXTRACTION PHACO AND INTRAOCULAR LENS PLACEMENT ([place]) RIGHT DIABETIC;  Surgeon: Leandrew Koyanagi, MD;  Location: [place];  Service: Ophthalmology;  Laterality: Right;  DIABETIC  . ROTATOR CUFF REPAIR Right 2009    MEDICATIONS: . aspirin EC 81 MG tablet  . dexlansoprazole (DEXILANT) 60 MG capsule  . ibuprofen (ADVIL,MOTRIN) 200 MG tablet  . insulin glargine (LANTUS) 100 UNIT/ML injection  . losartan (COZAAR) 50 MG tablet  . metFORMIN (GLUCOPHAGE-XR) 500 MG 24 hr tablet  . Multiple Vitamin (MULTIVITAMIN WITH MINERALS) TABS tablet  . NONFORMULARY OR COMPOUNDED ITEM  . rosuvastatin (CRESTOR) 40 MG tablet  . zolpidem (AMBIEN) 10 MG tablet   No current facility-administered medications for this encounter.    Maia Plan [facility] Pre-Surgical Testing [phone] 02/24/18 10:17 AM

## 2018-02-25 ENCOUNTER — Ambulatory Visit (HOSPITAL_COMMUNITY): Payer: No Typology Code available for payment source | Admitting: Anesthesiology

## 2018-02-25 ENCOUNTER — Ambulatory Visit: Payer: PRIVATE HEALTH INSURANCE | Admitting: Physical Therapy

## 2018-02-25 ENCOUNTER — Ambulatory Visit (HOSPITAL_COMMUNITY): Payer: No Typology Code available for payment source | Admitting: Physician Assistant

## 2018-02-25 ENCOUNTER — Encounter (HOSPITAL_COMMUNITY)
Admission: RE | Disposition: A | Discharge status: Home / Self Care | Payer: Self-pay | Source: Home / Self Care | Attending: Orthopedic Surgery

## 2018-02-25 ENCOUNTER — Other Ambulatory Visit: Payer: Self-pay

## 2018-02-25 ENCOUNTER — Ambulatory Visit (HOSPITAL_COMMUNITY)
Admission: RE | Admit: 2018-02-25 | Discharge: 2018-02-25 | Disposition: A | Discharge status: Home / Self Care | Payer: No Typology Code available for payment source | Attending: Orthopedic Surgery | Admitting: Orthopedic Surgery

## 2018-02-25 ENCOUNTER — Encounter (HOSPITAL_COMMUNITY): Payer: Self-pay | Admitting: *Deleted

## 2018-02-25 DIAGNOSIS — Z79899 Other long term (current) drug therapy: Secondary | ICD-10-CM | POA: Insufficient documentation

## 2018-02-25 DIAGNOSIS — Z87891 Personal history of nicotine dependence: Secondary | ICD-10-CM | POA: Insufficient documentation

## 2018-02-25 DIAGNOSIS — I10 Essential (primary) hypertension: Secondary | ICD-10-CM | POA: Insufficient documentation

## 2018-02-25 DIAGNOSIS — Z7982 Long term (current) use of aspirin: Secondary | ICD-10-CM | POA: Insufficient documentation

## 2018-02-25 DIAGNOSIS — M75122 Complete rotator cuff tear or rupture of left shoulder, not specified as traumatic: Secondary | ICD-10-CM | POA: Insufficient documentation

## 2018-02-25 DIAGNOSIS — Z794 Long term (current) use of insulin: Secondary | ICD-10-CM | POA: Insufficient documentation

## 2018-02-25 DIAGNOSIS — M25812 Other specified joint disorders, left shoulder: Secondary | ICD-10-CM | POA: Insufficient documentation

## 2018-02-25 DIAGNOSIS — K219 Gastro-esophageal reflux disease without esophagitis: Secondary | ICD-10-CM | POA: Insufficient documentation

## 2018-02-25 DIAGNOSIS — E119 Type 2 diabetes mellitus without complications: Secondary | ICD-10-CM | POA: Insufficient documentation

## 2018-02-25 HISTORY — PX: ARTHOSCOPIC ROTAOR CUFF REPAIR: SHX5002

## 2018-02-25 LAB — GLUCOSE, CAPILLARY
Glucose-Capillary: 132 mg/dL — ABNORMAL HIGH (ref 70–99)
Glucose-Capillary: 105 mg/dL — ABNORMAL HIGH (ref 70–99)

## 2018-02-25 SURGERY — REPAIR, ROTATOR CUFF, ARTHROSCOPIC
Anesthesia: General | Site: Shoulder | Laterality: Left

## 2018-02-25 MED ORDER — ACETAMINOPHEN 500 MG PO TABS
1000.0000 mg | ORAL_TABLET | Freq: Once | ORAL | Status: AC
  Administered 2018-02-25: 1000 mg via ORAL

## 2018-02-25 MED ORDER — LIDOCAINE 2% (20 MG/ML) 5 ML SYRINGE
INTRAMUSCULAR | Status: AC
  Filled 2018-02-25: qty 5

## 2018-02-25 MED ORDER — PHENYLEPHRINE 40 MCG/ML (10ML) SYRINGE FOR IV PUSH (FOR BLOOD PRESSURE SUPPORT)
PREFILLED_SYRINGE | INTRAVENOUS | Status: DC | PRN
  Administered 2018-02-25 (×4): 80 ug via INTRAVENOUS

## 2018-02-25 MED ORDER — SODIUM CHLORIDE 0.9 % IR SOLN
Status: AC | PRN
  Administered 2018-02-25: 6000 mL/h

## 2018-02-25 MED ORDER — TRANEXAMIC ACID-NACL 1000-0.7 MG/100ML-% IV SOLN
1000.0000 mg | INTRAVENOUS | Status: DC
  Filled 2018-02-25: qty 100

## 2018-02-25 MED ORDER — ROCURONIUM BROMIDE 10 MG/ML (PF) SYRINGE
PREFILLED_SYRINGE | INTRAVENOUS | Status: DC | PRN
  Administered 2018-02-25: 50 mg via INTRAVENOUS

## 2018-02-25 MED ORDER — FENTANYL CITRATE (PF) 100 MCG/2ML IJ SOLN
50.0000 ug | INTRAMUSCULAR | Status: DC
  Administered 2018-02-25: 100 ug via INTRAVENOUS
  Administered 2018-02-25: 50 ug via INTRAVENOUS
  Filled 2018-02-25: qty 2

## 2018-02-25 MED ORDER — EPHEDRINE SULFATE-NACL 50-0.9 MG/10ML-% IV SOSY
PREFILLED_SYRINGE | INTRAVENOUS | Status: DC | PRN
  Administered 2018-02-25 (×4): 5 mg via INTRAVENOUS

## 2018-02-25 MED ORDER — OXYCODONE-ACETAMINOPHEN 5-325 MG PO TABS: 1.0000 | ORAL_TABLET | ORAL | 0 refills | Status: DC | PRN

## 2018-02-25 MED ORDER — KETOROLAC TROMETHAMINE 30 MG/ML IJ SOLN: 30.0000 mg | Freq: Once | INTRAMUSCULAR | Status: DC | PRN

## 2018-02-25 MED ORDER — PROMETHAZINE HCL 25 MG/ML IJ SOLN: 6.2500 mg | INTRAMUSCULAR | Status: DC | PRN

## 2018-02-25 MED ORDER — MIDAZOLAM HCL 2 MG/2ML IJ SOLN
1.0000 mg | INTRAMUSCULAR | Status: DC
  Administered 2018-02-25 (×2): 2 mg via INTRAVENOUS
  Filled 2018-02-25: qty 2

## 2018-02-25 MED ORDER — MIDAZOLAM HCL 2 MG/2ML IJ SOLN
INTRAMUSCULAR | Status: AC
  Filled 2018-02-25: qty 2

## 2018-02-25 MED ORDER — BUPIVACAINE LIPOSOME 1.3 % IJ SUSP
INTRAMUSCULAR | Status: DC | PRN
  Administered 2018-02-25: 10 mL via PERINEURAL

## 2018-02-25 MED ORDER — GLYCOPYRROLATE PF 0.2 MG/ML IJ SOSY
PREFILLED_SYRINGE | INTRAMUSCULAR | Status: DC | PRN
  Administered 2018-02-25: .2 mg via INTRAVENOUS

## 2018-02-25 MED ORDER — ONDANSETRON HCL 4 MG/2ML IJ SOLN
INTRAMUSCULAR | Status: AC
  Filled 2018-02-25: qty 2

## 2018-02-25 MED ORDER — PROPOFOL 10 MG/ML IV BOLUS
INTRAVENOUS | Status: AC
  Filled 2018-02-25: qty 20

## 2018-02-25 MED ORDER — FENTANYL CITRATE (PF) 100 MCG/2ML IJ SOLN: 25.0000 ug | INTRAMUSCULAR | Status: DC | PRN

## 2018-02-25 MED ORDER — FENTANYL CITRATE (PF) 250 MCG/5ML IJ SOLN
INTRAMUSCULAR | Status: AC
  Filled 2018-02-25: qty 5

## 2018-02-25 MED ORDER — CHLORHEXIDINE GLUCONATE 4 % EX LIQD: 60.0000 mL | Freq: Once | CUTANEOUS | Status: DC

## 2018-02-25 MED ORDER — ACETAMINOPHEN 500 MG PO TABS
ORAL_TABLET | ORAL | Status: AC
  Filled 2018-02-25: qty 2

## 2018-02-25 MED ORDER — ONDANSETRON HCL 4 MG/2ML IJ SOLN
INTRAMUSCULAR | Status: DC | PRN
  Administered 2018-02-25: 4 mg via INTRAVENOUS

## 2018-02-25 MED ORDER — EPHEDRINE 5 MG/ML INJ
INTRAVENOUS | Status: AC
  Filled 2018-02-25: qty 10

## 2018-02-25 MED ORDER — BUPIVACAINE HCL (PF) 0.5 % IJ SOLN
INTRAMUSCULAR | Status: DC | PRN
  Administered 2018-02-25: 15 mL via PERINEURAL

## 2018-02-25 MED ORDER — PROPOFOL 10 MG/ML IV BOLUS
INTRAVENOUS | Status: DC | PRN
  Administered 2018-02-25: 150 mg via INTRAVENOUS

## 2018-02-25 MED ORDER — SUGAMMADEX SODIUM 200 MG/2ML IV SOLN
INTRAVENOUS | Status: DC | PRN
  Administered 2018-02-25: 200 mg via INTRAVENOUS

## 2018-02-25 MED ORDER — LIDOCAINE 2% (20 MG/ML) 5 ML SYRINGE
INTRAMUSCULAR | Status: DC | PRN
  Administered 2018-02-25: 60 mg via INTRAVENOUS

## 2018-02-25 MED ORDER — CEFAZOLIN SODIUM-DEXTROSE 2-4 GM/100ML-% IV SOLN
2.0000 g | INTRAVENOUS | Status: AC
  Administered 2018-02-25: 2 g via INTRAVENOUS
  Filled 2018-02-25: qty 100

## 2018-02-25 MED ORDER — SUGAMMADEX SODIUM 200 MG/2ML IV SOLN
INTRAVENOUS | Status: AC
  Filled 2018-02-25: qty 2

## 2018-02-25 MED ORDER — ROCURONIUM BROMIDE 100 MG/10ML IV SOLN
INTRAVENOUS | Status: AC
  Filled 2018-02-25: qty 1

## 2018-02-25 MED ORDER — DEXAMETHASONE SODIUM PHOSPHATE 4 MG/ML IJ SOLN
INTRAMUSCULAR | Status: DC | PRN
  Administered 2018-02-25: 5 mg via INTRAVENOUS

## 2018-02-25 MED ORDER — PHENYLEPHRINE 40 MCG/ML (10ML) SYRINGE FOR IV PUSH (FOR BLOOD PRESSURE SUPPORT)
PREFILLED_SYRINGE | INTRAVENOUS | Status: AC
  Filled 2018-02-25: qty 10

## 2018-02-25 MED ORDER — GLYCOPYRROLATE PF 0.2 MG/ML IJ SOSY
PREFILLED_SYRINGE | INTRAMUSCULAR | Status: AC
  Filled 2018-02-25: qty 1

## 2018-02-25 MED ORDER — LACTATED RINGERS IV SOLN
INTRAVENOUS | Status: DC
  Administered 2018-02-25 (×2): via INTRAVENOUS

## 2018-02-25 MED ORDER — METHOCARBAMOL 500 MG PO TABS: 500.0000 mg | ORAL_TABLET | Freq: Two times a day (BID) | ORAL | 0 refills | Status: DC

## 2018-02-25 MED ORDER — DEXAMETHASONE SODIUM PHOSPHATE 10 MG/ML IJ SOLN
INTRAMUSCULAR | Status: AC
  Filled 2018-02-25: qty 1

## 2018-02-25 MED FILL — OXYCODONE-ACETAMINOPHEN 5-3: 5-325 | 7 days supply | Qty: 30 | Fill #0

## 2018-02-25 MED FILL — METHOCARBAMOL 500 MG TABLET: 500 | 15 days supply | Qty: 60 | Fill #0

## 2018-02-25 SURGICAL SUPPLY — 47 items
ANCHOR PEEK 4.75X19.1 SWLK C (Anchor) ×4 IMPLANT
BLADE SURG SZ11 CARB STEEL (BLADE) ×2 IMPLANT
BURR OVAL 8 FLU 4.0X13 (MISCELLANEOUS) ×2 IMPLANT
CANNULA 5.75X71 LONG (CANNULA) ×2 IMPLANT
CANNULA TWIST IN 8.25X7CM (CANNULA) ×2 IMPLANT
CHLORAPREP W/TINT 26ML (MISCELLANEOUS) ×2 IMPLANT
COVER SURGICAL LIGHT HANDLE (MISCELLANEOUS) ×2 IMPLANT
COVER WAND RF STERILE (DRAPES) IMPLANT
CUTTER BONE 4.0MM X 13CM (MISCELLANEOUS) ×2 IMPLANT
DRAPE INCISE IOBAN 66X45 STRL (DRAPES) IMPLANT
DRAPE ORTHO SPLIT 77X108 STRL (DRAPES) ×1
DRAPE STERI 35X30 U-POUCH (DRAPES) ×2 IMPLANT
DRAPE SURG ORHT 6 SPLT 77X108 (DRAPES) ×1 IMPLANT
DRAPE U-SHAPE 47X51 STRL (DRAPES) ×6 IMPLANT
DRSG PAD ABDOMINAL 8X10 ST (GAUZE/BANDAGES/DRESSINGS) ×2 IMPLANT
ELECT REM PT RETURN 15FT ADLT (MISCELLANEOUS) ×2 IMPLANT
GAUZE SPONGE 4X4 12PLY STRL (GAUZE/BANDAGES/DRESSINGS) ×2 IMPLANT
GAUZE XEROFORM 1X8 LF (GAUZE/BANDAGES/DRESSINGS) ×2 IMPLANT
GLOVE BIO SURGEON STRL SZ7.5 (GLOVE) ×2 IMPLANT
GLOVE BIOGEL PI IND STRL 8 (GLOVE) ×1 IMPLANT
GLOVE BIOGEL PI INDICATOR 8 (GLOVE) ×1
GOWN STRL REUS W/TWL LRG LVL3 (GOWN DISPOSABLE) ×2 IMPLANT
GOWN STRL REUS W/TWL XL LVL3 (GOWN DISPOSABLE) ×2 IMPLANT
KIT BASIN OR (CUSTOM PROCEDURE TRAY) IMPLANT
MANIFOLD NEPTUNE II (INSTRUMENTS) ×2 IMPLANT
NEEDLE HYPO 25X1 1.5 SAFETY (NEEDLE) ×2 IMPLANT
NEEDLE SCORPION MULTI FIRE (NEEDLE) ×2 IMPLANT
NEEDLE SPNL 18GX3.5 QUINCKE PK (NEEDLE) ×2 IMPLANT
PACK SHOULDER (CUSTOM PROCEDURE TRAY) ×2 IMPLANT
PROBE BIPOLAR ATHRO 135MM 90D (MISCELLANEOUS) ×2 IMPLANT
PROTECTOR NERVE ULNAR (MISCELLANEOUS) ×2 IMPLANT
SLING ARM FOAM STRAP LRG (SOFTGOODS) ×2 IMPLANT
SLING ARM FOAM STRAP MED (SOFTGOODS) IMPLANT
SLING ARM IMMOBILIZER LRG (SOFTGOODS) ×2 IMPLANT
SLING ARM IMMOBILIZER MED (SOFTGOODS) IMPLANT
SUT ETHILON 3 0 PS 1 (SUTURE) ×2 IMPLANT
SUT PDS AB 1 CT1 27 (SUTURE) IMPLANT
SUTURE TAPE 1.3 40 TPR END (SUTURE) ×1 IMPLANT
SUTURETAPE 1.3 40 TPR END (SUTURE) ×2
SYR CONTROL 10ML LL (SYRINGE) ×2 IMPLANT
TAPE CLOTH SURG 6X10 WHT LF (GAUZE/BANDAGES/DRESSINGS) ×2 IMPLANT
TOWEL OR 17X26 10 PK STRL BLUE (TOWEL DISPOSABLE) ×2 IMPLANT
TOWEL OR NON WOVEN STRL DISP B (DISPOSABLE) ×2 IMPLANT
TUBING ARTHRO INFLOW-ONLY STRL (TUBING) IMPLANT
TUBING ARTHROSCOPY IRRIG 16FT (MISCELLANEOUS) ×2 IMPLANT
TUBING CONNECTING 10 (TUBING) ×4 IMPLANT
WAND HAND CNTRL MULTIVAC 90 (MISCELLANEOUS) ×2 IMPLANT

## 2018-02-25 NOTE — Discharge Instructions (Addendum)

## 2018-02-25 NOTE — Transfer of Care (Signed)
Immediate Anesthesia Transfer of Care Note  Patient: CHIRSTOPHER IOVINO  Procedure(s) Performed: LEFT ARTHROSCOPIC ROTATOR CUFF REPAIR; SUBACROMIAL DECOMPRESSION (Left Shoulder)  Patient Location: PACU  Anesthesia Type:General  Level of Consciousness: awake and patient cooperative  Airway & Oxygen Therapy: Patient Spontanous Breathing and Patient connected to face mask  Post-op Assessment: Report given to RN and Post -op Vital signs reviewed and stable  Post vital signs: Reviewed and stable  Last Vitals:  Vitals Value Taken Time  BP    Temp    Pulse 84 02/25/2018  4:08 PM  Resp 12 02/25/2018  4:08 PM  SpO2 100 % 02/25/2018  4:08 PM  Vitals shown include unvalidated device data.  Last Pain:  Vitals:   02/25/18 1119  TempSrc: Oral  PainSc:       Patients Stated Pain Goal: 5 (39/67/28 9791)  Complications: No apparent anesthesia complications

## 2018-02-25 NOTE — Anesthesia Procedure Notes (Signed)
Anesthesia Regional Block: Interscalene brachial plexus block   Pre-Anesthetic Checklist: ,, timeout performed, Correct Patient, Correct Site, Correct Laterality, Correct Procedure, Correct Position, site marked, Risks and benefits discussed,  Surgical consent,  Pre-op evaluation,  At surgeon's request and post-op pain management  Laterality: Left  Prep: chloraprep       Needles:  Injection technique: Single-shot  Needle Type: Echogenic Needle     Needle Length: 9cm      Additional Needles:   Procedures:,,,, ultrasound used (permanent image in chart),,,,  Narrative:  Start time: 02/25/2018 1:03 PM End time: 02/25/2018 1:13 PM Injection made incrementally with aspirations every 5 mL.  Performed by: Personally  Anesthesiologist: Myrtie Soman, MD  Additional Notes: Patient tolerated the procedure well without complications

## 2018-02-25 NOTE — Anesthesia Procedure Notes (Signed)
Anesthesia Procedure Image    

## 2018-02-25 NOTE — Progress Notes (Signed)
Assisted Dr. Kalman Shan with left, ultrasound guided, interscalene  block. Side rails up, monitors on throughout procedure. See vital signs in flow sheet. Tolerated Procedure well.

## 2018-02-25 NOTE — Op Note (Signed)
Procedure(s): LEFT ARTHROSCOPIC ROTATOR CUFF REPAIR; SUBACROMIAL DECOMPRESSION Procedure Note  Curtis Singh male 63 y.o. 02/25/2018  Preoperative diagnosis: #1 left shoulder rotator cuff tear #2 left shoulder impingement  Postoperative diagnosis: Same  Procedure(s) and Anesthesia Type:    * LEFT ARTHROSCOPIC ROTATOR CUFF REPAIR; SUBACROMIAL DECOMPRESSION - General  Surgeon(s) and Role:    Tania Ade, MD - Primary     Surgeon: Isabella Stalling   Assistants: Joanell Rising, PA-C Randall Hiss was present and scrubbed throughout the procedure and was essential for assistance with camera work positioning and closure).  Anesthesia: General endotracheal anesthesia with preoperative interscalene block given by the attending anesthesiologist    Procedure Detail  LEFT ARTHROSCOPIC ROTATOR CUFF REPAIR; SUBACROMIAL DECOMPRESSION  Estimated Blood Loss: Min         Drains: none  Blood Given: none         Specimens: none        Complications:  * No complications entered in OR log *         Disposition: PACU - hemodynamically stable.         Condition: stable    Procedure:   INDICATIONS FOR SURGERY: The patient is 63 y.o. male who has had a long history of left shoulder pain which is worsened recently.  MRI revealed a small full-thickness rotator cuff tear.  Indicated for surgical treatment to decrease pain and restore function.  OPERATIVE FINDINGS: Examination under anesthesia: No stiffness or instability.   DESCRIPTION OF PROCEDURE: The patient was identified in preoperative  holding area where I personally marked the operative site after  verifying site, side, and procedure with the patient. An interscalene block was given by the attending anesthesiologist the holding area.  The patient was taken back to the operating room where general anesthesia was induced without complication and was placed in the beach-chair position with the back  elevated about 60 degrees and all  extremities and head and neck carefully padded and  positioned.   The left upper extremity was then prepped and  draped in a standard sterile fashion. The appropriate time-out  procedure was carried out. The patient did receive IV antibiotics  within 30 minutes of incision.   A small posterior portal incision was made and the arthroscope was introduced into the joint. An anterior portal was then established above the subscapularis using needle localization. Small cannula was placed anteriorly. Diagnostic arthroscopy was then carried out.  There was noted to be mild fraying of the inner surface of the subscapularis.  This was debrided back to healthy tendon.  This involved less than 10% of the tendon thickness.  The proximal base of the biceps tendon had mild fraying debrided back to a stable surface.  This involved a minimal amount of the biceps and did not involve the superior labrum which was intact.  The remainder of the joint looked good without significant chondromalacia or significant labral pathology.  The supraspinatus was noted to have a small minimally retracted anterior tear.  This was lightly debrided from the undersurface.  The arthroscope was then introduced into the subacromial space a standard lateral portal was established with needle localization. The shaver was used through the lateral portal to perform extensive bursectomy. Coracoacromial ligament was examined and found to be frayed indicating chronic impingement.  The bursal surface of the rotator cuff was examined and the small minimally retracted tear was noted.  A large cannula was placed laterally.  The camera was moved to a posterior  lateral viewing portal position.  The small cannula was placed anteriorly in the subacromial space. The tuberosity was prepped to a bleeding bony surface with a bur.  One 4.75 peek swivel lock anchor was placed centrally just off the articular margin and the tear.  This anchor was preloaded with  2 suture tapes.  These 4 strands were then passed evenly throughout the tear anterior to posterior and brought over to an additional 4.75 peek swivel lock anchor in the lateral row bringing the tendon nicely down over the prepared tuberosity.  The repair was felt to be complete and watertight.  The coracoacromial ligament was taken down off the anterior acromion with the ArthroCare exposing a moderate hooked anterior acromial spur. A high-speed bur was then used through the lateral portal to take down the anterior acromial spur from lateral to medial in a standard acromioplasty.  The acromioplasty was also viewed from the lateral portal and the bur was used as necessary to ensure that the acromion was completely flat from posterior to anterior.   The arthroscopic equipment was removed from the joint and the portals were closed with 3-0 nylon in an interrupted fashion. Sterile dressings were then applied including Xeroform 4 x 4's ABDs and tape. The patient was then allowed to awaken from general anesthesia, placed in a sling, transferred to the stretcher and taken to the recovery room in stable condition.   POSTOPERATIVE PLAN: The patient will be discharged home today and will followup in one week for suture removal and wound check.  We will get him into PT per protocol.

## 2018-02-25 NOTE — Anesthesia Procedure Notes (Signed)
Procedure Name: Intubation Date/Time: 02/25/2018 2:30 PM Performed by: Claudia Desanctis, CRNA Pre-anesthesia Checklist: Patient identified, Emergency Drugs available, Suction available and Patient being monitored Patient Re-evaluated:Patient Re-evaluated prior to induction Oxygen Delivery Method: Circle system utilized Preoxygenation: Pre-oxygenation with 100% oxygen Induction Type: IV induction Ventilation: Mask ventilation without difficulty Laryngoscope Size: 2 and Miller Grade View: Grade I Tube type: Oral Tube size: 7.5 mm Number of attempts: 1 Airway Equipment and Method: Stylet Placement Confirmation: ETT inserted through vocal cords under direct vision,  positive ETCO2 and breath sounds checked- equal and bilateral Secured at: 21 cm Tube secured with: Tape Dental Injury: Teeth and Oropharynx as per pre-operative assessment

## 2018-02-25 NOTE — H&P (Signed)
Curtis Singh is an 63 y.o. male.   Chief Complaint: Left shoulder pain and dysfunction HPI: Daruis is a 63 year old gentleman with a long history of left shoulder pain which is been worse recently.  Recent MRI shows full-thickness tear of the supraspinatus with minimal retraction.  Indicated for surgical treatment to decrease pain and restore function.  Past Medical History:  Diagnosis Date  . 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 ED (erectile dysfunction)   . GERD (gastroesophageal reflux disease)   . 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 Hypertension   . Seasonal allergies     Past Surgical History:  Procedure Laterality Date  . CATARACT EXTRACTION W/PHACO Right 09/03/2016   Procedure: CATARACT EXTRACTION PHACO AND INTRAOCULAR LENS PLACEMENT ([place]) RIGHT DIABETIC;  Surgeon: Leandrew Koyanagi, MD;  Location: [place];  Service: Ophthalmology;  Laterality: Right;  DIABETIC  . ROTATOR CUFF REPAIR Right 2009    Family History  Problem Relation Age of Onset  . Heart attack Father   . Heart attack Maternal Uncle   . Heart attack Maternal Uncle   . Heart attack Maternal Uncle   . Heart attack Maternal Uncle   . Colon cancer Neg Hx   . Esophageal cancer Neg Hx   . Rectal cancer Neg Hx   . Stomach cancer Neg Hx   . Kidney cancer Neg Hx   . Kidney disease Neg Hx   . Prostate cancer Neg Hx   . Urolithiasis Neg Hx    Social History:  reports that he quit smoking about 36 years ago. He has never used smokeless tobacco. He reports current alcohol use of about 12.0 - 18.0 standard drinks of alcohol per week. He reports that he does not use drugs.  Allergies: No Known Allergies  Medications Prior to Admission  Medication Sig Dispense Refill  . aspirin EC 81 MG tablet Take 1 tablet (81 mg total) by mouth daily. 90 tablet 3  . dexlansoprazole (DEXILANT) 60 MG capsule Take 60 mg by mouth at bedtime.    Marland Kitchen ibuprofen (ADVIL,MOTRIN) 200 MG tablet Take 800 mg by mouth  every 8 (eight) hours as needed for moderate pain.    Marland Kitchen insulin glargine (LANTUS) 100 UNIT/ML injection Inject 12-15 Units into the skin daily as needed (if blood sugar is above 200).    Marland Kitchen losartan (COZAAR) 50 MG tablet Take 50 mg by mouth at bedtime.     . metFORMIN (GLUCOPHAGE-XR) 500 MG 24 hr tablet Take 500 mg by mouth 2 (two) times daily.    . Multiple Vitamin (MULTIVITAMIN WITH MINERALS) TABS tablet Take 1 tablet by mouth at bedtime.    . NONFORMULARY OR COMPOUNDED ITEM Trimix (30/1/10)-(Pap/Phent/PGE) Dosage: Inject 1.0 cc per injection Prefilled syringe : 10 syringes Qty10 [place] [phone] Fax [phone] (Patient taking differently: Inject 1 Dose as directed daily as needed (erectile dysfunction). Trimix (30/1/10)-(Pap/Phent/PGE) Dosage: Inject 1.0 cc per injection Prefilled syringe : 10 syringes Qty10 [place] [phone] Fax [phone]) 10 each 6  . rosuvastatin (CRESTOR) 40 MG tablet Take 1 tablet (40 mg total) by mouth daily. (Patient taking differently: Take 40 mg by mouth at bedtime. ) 90 tablet 3  . zolpidem (AMBIEN) 10 MG tablet Take 10 mg by mouth at bedtime as needed for sleep.      Results for orders placed or performed during the hospital encounter of 02/25/18 (from the past 48 hour(s))  Glucose,  capillary     Status: Abnormal   Collection Time: 02/25/18 11:22 AM  Result Value Ref Range   Glucose-Capillary 132 (H) 70 - 99 mg/dL   No results found.  Review of Systems  All other systems reviewed and are negative.   Blood pressure 127/78, pulse (!) 53, temperature 98.1 F (36.7 C), temperature source Oral, resp. rate 16, height 5' 10.5" (1.791 m), weight 93.9 kg, SpO2 100 %. Physical Exam  Constitutional: He is oriented to person, place, and time. He appears well-developed and well-nourished.  HENT:  Head: Atraumatic.  Eyes: EOM are normal.  Cardiovascular: Intact distal pulses.  Respiratory: Effort normal.   Musculoskeletal:     Comments: Left shoulder pain and weakness with rotator cuff testing.  Distally neurovascularly intact.  Neurological: He is alert and oriented to person, place, and time.  Skin: Skin is warm and dry.  Psychiatric: He has a normal mood and affect.     Assessment/Plan Left shoulder symptomatic full-thickness rotator cuff tear Plan arthroscopic rotator cuff repair, subacromial decompression Risks / benefits of surgery discussed Consent on chart  NPO for OR Preop antibiotics   Isabella Stalling, MD 02/25/2018, 1:22 PM

## 2018-02-25 NOTE — Anesthesia Postprocedure Evaluation (Signed)
Anesthesia Post Note  Patient: Curtis Singh  Procedure(s) Performed: LEFT ARTHROSCOPIC ROTATOR CUFF REPAIR; SUBACROMIAL DECOMPRESSION (Left Shoulder)     Patient location during evaluation: PACU Anesthesia Type: General Level of consciousness: awake and alert Pain management: pain level controlled Vital Signs Assessment: post-procedure vital signs reviewed and stable Respiratory status: spontaneous breathing, nonlabored ventilation, respiratory function stable and patient connected to nasal cannula oxygen Cardiovascular status: blood pressure returned to baseline and stable Postop Assessment: no apparent nausea or vomiting Anesthetic complications: no    Last Vitals:  Vitals:   02/25/18 1645 02/25/18 1715  BP: 138/68 134/72  Pulse: 75 76  Resp: 18 20  Temp: 36.6 C 36.5 C  SpO2: 95% 98%    Last Pain:  Vitals:   02/25/18 1715  TempSrc:   PainSc: 0-No pain                 Flossie Wexler

## 2018-02-26 ENCOUNTER — Encounter (HOSPITAL_COMMUNITY): Payer: Self-pay | Admitting: Orthopedic Surgery

## 2018-03-03 ENCOUNTER — Ambulatory Visit (HOSPITAL_COMMUNITY)
Admission: RE | Admit: 2018-03-03 | Discharge: 2018-03-03 | Disposition: A | Discharge status: Home / Self Care | Payer: No Typology Code available for payment source | Source: Ambulatory Visit | Attending: Orthopedic Surgery | Admitting: Orthopedic Surgery

## 2018-03-03 ENCOUNTER — Other Ambulatory Visit (HOSPITAL_COMMUNITY): Payer: Self-pay | Admitting: Orthopedic Surgery

## 2018-03-03 DIAGNOSIS — R52 Pain, unspecified: Secondary | ICD-10-CM | POA: Insufficient documentation

## 2018-03-03 NOTE — Progress Notes (Signed)
Left upper extremity venous duplex has been completed. Preliminary results can be found in CV Proc through chart review.  Results were given to Joaquim Lai at Dr. Bettina Gavia office.  03/03/18 10:10 AM Curtis Singh RVT

## 2018-03-09 ENCOUNTER — Ambulatory Visit: Payer: No Typology Code available for payment source | Attending: Orthopedic Surgery | Admitting: Physical Therapy

## 2018-03-09 ENCOUNTER — Encounter: Payer: Self-pay | Admitting: Physical Therapy

## 2018-03-09 DIAGNOSIS — Z9889 Other specified postprocedural states: Secondary | ICD-10-CM | POA: Diagnosis present

## 2018-03-09 DIAGNOSIS — M25612 Stiffness of left shoulder, not elsewhere classified: Secondary | ICD-10-CM | POA: Diagnosis present

## 2018-03-09 DIAGNOSIS — M25512 Pain in left shoulder: Secondary | ICD-10-CM | POA: Insufficient documentation

## 2018-03-09 DIAGNOSIS — M6281 Muscle weakness (generalized): Secondary | ICD-10-CM | POA: Diagnosis present

## 2018-03-09 NOTE — Patient Instructions (Signed)
Access Code: [email or web address]  URL: [email or web address]  Date: 03/09/2018  Prepared by: Dorcas Carrow   Exercises  Flexion-Extension Shoulder Pendulum with Table Support - 5 reps - 1 sets - 3x daily - 7x weekly  Standing Elbow Flexion Extension AROM - 20 reps - 1 sets - 3x daily - 7x weekly  Seated Single Arm Forearm Supination Pronation - 20 reps - 1 sets - 3x daily - 7x weekly

## 2018-03-13 NOTE — Therapy (Signed)
[facility] [address] [place], [place], [address] Phone: [phone]   Fax:  [phone]  Physical Therapy Evaluation  Patient Details  Name: Curtis Singh MRN: [id number] Date of Birth: 06-Oct-1955 Referring Provider (PT): Dr. Tania Ade   Encounter Date: 03/09/2018    Treatment: 1 of 9.  Recert date: 0/62/3762 1340 to 23   Past Medical History:  Diagnosis Date  . 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 ED (erectile dysfunction)   . GERD (gastroesophageal reflux disease)   . 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 Hypertension   . Seasonal allergies     Past Surgical History:  Procedure Laterality Date  . ARTHOSCOPIC ROTAOR CUFF REPAIR Left 02/25/2018   Procedure: LEFT ARTHROSCOPIC ROTATOR CUFF REPAIR; SUBACROMIAL DECOMPRESSION;  Surgeon: Tania Ade, MD;  Location[facility];  Service: Orthopedics;  Laterality: Left;  . CATARACT EXTRACTION W/PHACO Right 09/03/2016   Procedure: CATARACT EXTRACTION PHACO AND INTRAOCULAR LENS PLACEMENT ([place]) RIGHT DIABETIC;  Surgeon: Leandrew Koyanagi, MD;  Location: [place];  Service: Ophthalmology;  Laterality: Right;  DIABETIC  . ROTATOR CUFF REPAIR Right 2009    There were no vitals filed for this visit.    Pt. reports 4-5/10 L shoulder pain at rest in sling. Pt. reports 9/10 pain at worst (no pain medications). S/p L RTC repair on 02/25/2018.     Manual tx.:  Supine L shoulder PROM (all planes)- 10x each.  Per MD protocol STM to L UT/ deltoid/ biceps musculature  Ice to L shoulder in sitting after tx.    See HEP    PT Education - 03/13/18 1808    Education Details  See HEP.  No active shoulder motion.  PT reviewed RTC repair protocol.      Person(s) Educated  Patient    Methods  Explanation;Demonstration;Handout    Comprehension  Verbalized understanding;Returned demonstration          PT Long Term Goals - 03/13/18 1840      PT LONG  TERM GOAL #1   Title  Pt. will increase FOTO to 67 to improve L shoulder pain-free moblity with daily tasks.     Baseline  FOTO: initial: 36/ goal: 67.    Time  4    Period  Weeks    Status  New    Target Date  04/06/18      PT LONG TERM GOAL #2   Title  Pt. will incresae L shoulder AAROM to [facility] as compared to R shoulder to improve overhead reaching/ functional tasks.      Baseline  Supine L shoulder PROM: flexion 112 deg., abduction 88 deg., ER 0 deg., IR 75 deg.    Time  4    Period  Weeks    Status  New    Target Date  04/06/18      PT LONG TERM GOAL #3   Title  Pt. will increase L shoulder strength to grossly 4/5 MMT (flexion/ abduction/ ER) to improve progression of functional/ work-related tasks.      Baseline  No strength testing at this time.      Time  4    Period  Weeks    Status  New    Target Date  04/06/18      PT LONG TERM GOAL #4   Title  Pt. will report <3/10 L shoulder pain at worst with progression of ROM ex.  Baseline  4-5/10 L shoulder pain at rest/ >9/10 at worst (no pain medications).      Time  4    Period  Weeks    Status  New    Target Date  04/06/18        Pt. is a pleasant 63 y/o male s/p L RTC repair on 02/25/2018. Pt. known well to PT clinic secondary to recent PT for low back pain. Pt. reports 4-5/10 L shoulder pain at rest with use of sling. FOTO: initial: 36/ goal: 67. Seated cervical AROM: WFL all planes of movement (no increase sh. pain). B UT muscle tightness noted in seated posture. Pt. healing well with 2 small scabs present. Minimal yellowing/ bruising noted in L anterior shoulder/ proximal biceps. Supine L shoulder PROM: flexion 112 deg., abduction 88 deg., ER 0 deg., IR 75 deg. No strength testing at this time. PT reviewed RTC repair protocol with pt. and issued HEP. Pt. will benefit from skilled PT services to increase L shoulder ROM/ strengthening to improve pain-free mobility/ return to work with no limitations.        Patient will  benefit from skilled therapeutic intervention in order to improve the following deficits and impairments:  Pain, Improper body mechanics, Decreased mobility, Increased muscle spasms, Postural dysfunction, Difficulty walking, Decreased strength, Decreased range of motion, Decreased activity tolerance, Decreased endurance, Hypomobility  Visit Diagnosis: S/P left rotator cuff repair  Shoulder joint stiffness, left  Muscle weakness (generalized)  Acute pain of left shoulder     Problem List Patient Active Problem List   Diagnosis Date Noted  . ED (erectile dysfunction) of organic origin 02/21/2015  . Diabetes ([place]) 10/18/2014  . Hyperlipemia 10/18/2014   Pura Spice, PT, DPT # [phone] 03/13/2018, 6:48 PM  [facility] [address] [place], [place], [address] Phone: [phone]   Fax:  [phone]  Name: Curtis Singh MRN: [id number] Date of Birth: 1955-04-14

## 2018-03-15 ENCOUNTER — Other Ambulatory Visit: Payer: Self-pay | Admitting: Orthopedic Surgery

## 2018-03-15 DIAGNOSIS — M545 Low back pain, unspecified: Secondary | ICD-10-CM

## 2018-03-16 ENCOUNTER — Ambulatory Visit: Payer: No Typology Code available for payment source | Admitting: Physical Therapy

## 2018-03-16 DIAGNOSIS — Z9889 Other specified postprocedural states: Secondary | ICD-10-CM

## 2018-03-16 DIAGNOSIS — M25512 Pain in left shoulder: Secondary | ICD-10-CM

## 2018-03-16 DIAGNOSIS — M25612 Stiffness of left shoulder, not elsewhere classified: Secondary | ICD-10-CM

## 2018-03-16 DIAGNOSIS — M6281 Muscle weakness (generalized): Secondary | ICD-10-CM

## 2018-03-16 NOTE — Patient Instructions (Signed)
Access Code: [email or web address]  URL: [email or web address]  Date: 03/16/2018  Prepared by: Dorcas Carrow   Exercises  Seated Shoulder Flexion AAROM with Pulley Behind - 20 reps - 1 sets - 2x daily - 7x weekly  Seated Shoulder Abduction AAROM with Pulley Behind - 20 reps - 1 sets - 2x daily - 7x weekly  Supine Shoulder Press AAROM in Abduction with [place] - 20 reps - 1 sets - 2x daily - 7x weekly

## 2018-03-17 NOTE — Therapy (Signed)
[facility] [address] [place], [place], [address] Phone: [phone]   Fax:  [phone]  Physical Therapy Treatment  Patient Details  Name: Curtis Singh MRN: [id number] Date of Birth: August 02, 1955 Referring Provider (PT): Dr. Tania Ade   Encounter Date: 03/16/2018  PT End of Session - 03/17/18 1725    Visit Number  2    Number of Visits  9    Date for PT Re-Evaluation  04/06/18    PT Start Time  6378    PT Stop Time  0951    PT Time Calculation (min)  56 min    Activity Tolerance  Patient limited by pain;Patient tolerated treatment well    Behavior During Therapy  [facility] for tasks assessed/performed       Past Medical History:  Diagnosis Date  . 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 ED (erectile dysfunction)   . GERD (gastroesophageal reflux disease)   . 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 Hypertension   . Seasonal allergies     Past Surgical History:  Procedure Laterality Date  . ARTHOSCOPIC ROTAOR CUFF REPAIR Left 02/25/2018   Procedure: LEFT ARTHROSCOPIC ROTATOR CUFF REPAIR; SUBACROMIAL DECOMPRESSION;  Surgeon: Tania Ade, MD;  [facility]: [facility];  Service: Orthopedics;  Laterality: Left;  . CATARACT EXTRACTION W/PHACO Right 09/03/2016   Procedure: CATARACT EXTRACTION PHACO AND INTRAOCULAR LENS PLACEMENT ([place]) RIGHT DIABETIC;  Surgeon: Leandrew Koyanagi, MD;  Location: [facility];  Service: Ophthalmology;  Laterality: Right;  DIABETIC  . ROTATOR CUFF REPAIR Right 2009    There were no vitals filed for this visit.  Subjective Assessment - 03/17/18 1711    Subjective  Pt. c/o 7/10 L shoulder pain currently.  Pt. has remained compliant with use of sling/ HEP.  Pt. states he is not sleeping well at night.  Pt. has MRI scheduled for low back this Friday.      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 He was moving boxes while working and he  picked 2 boxes up from roughly waist/chest height and rotated L with the boxes to set them down. As soon as he put the boxes down, he felt pain his R low back which has spread across the whole low back, but R>L. Patient is currently not picking up anything >20lbs and is to avoid bending, twisting, lifting.  Pt. was being treated for LBP up until L RTC repair surgery on 02/25/2018.  Pt. is hoping to return to work on 03/22/2018 with limited duty secondary to shoulder limitations/ protocol.       Limitations  Lifting;Standing;Walking;House hold activities;Sitting    Patient Stated Goals  Increase L shoulder ROM/ strengthening to promote return to work.      Currently in Pain?  Yes    Pain Score  7     Pain Location  Shoulder    Pain Orientation  Left    Pain Descriptors / Indicators  Aching;Sharp    Pain Type  Surgical pain        There.ex.:  Issued shoulder pulleys: flexion/ abduction 20x each with use of R UE (AA/PROM)- pain tolerable Reviewed HEP Supine chest press with wand 20x (AA/PROM)  Manual tx.:  Supine L shoulder PROM (all planes) per MD protocol STM to L deltoid/ STM/ proximal biceps and triceps   Ice to L shoulder in seated position  PT Education - 03/17/18 1725    Education Details  See HEP (issued pulley)    Person(s) Educated  Patient    Methods  Explanation;Demonstration;Handout    Comprehension  Verbalized understanding;Returned demonstration          PT Long Term Goals - 03/13/18 1840      PT LONG TERM GOAL #1   Title  Pt. will increase FOTO to 67 to improve L shoulder pain-free moblity with daily tasks.     Baseline  FOTO: initial: 36/ goal: 67.    Time  4    Period  Weeks    Status  New    Target Date  04/06/18      PT LONG TERM GOAL #2   Title  Pt. will incresae L shoulder AAROM to [facility] as compared to R shoulder to improve overhead reaching/ functional tasks.      Baseline  Supine L shoulder PROM: flexion 112 deg., abduction 88 deg., ER 0 deg., IR  75 deg.    Time  4    Period  Weeks    Status  New    Target Date  04/06/18      PT LONG TERM GOAL #3   Title  Pt. will increase L shoulder strength to grossly 4/5 MMT (flexion/ abduction/ ER) to improve progression of functional/ work-related tasks.      Baseline  No strength testing at this time.      Time  4    Period  Weeks    Status  New    Target Date  04/06/18      PT LONG TERM GOAL #4   Title  Pt. will report <3/10 L shoulder pain at worst with progression of ROM ex.    Baseline  4-5/10 L shoulder pain at rest/ >9/10 at worst (no pain medications).      Time  4    Period  Weeks    Status  New    Target Date  04/06/18         Plan - 03/17/18 1726    Clinical Impression Statement  Pt. doing well with L shoulder protocol and addition of AA/PROM with use of pulley.  Pt. understands limitations at this time and progressing well with PROM in supine position.  No scar massage at this time due to minimal scabbing in place.  Good elbow ROM (all planes).      Stability/Clinical Decision Making  Stable/Uncomplicated    Clinical Decision Making  Low    Rehab Potential  Excellent    PT Frequency  2x / week    PT Duration  4 weeks    PT Treatment/Interventions  Aquatic Therapy;Cryotherapy;Electrical Stimulation;Iontophoresis 4mg /ml Dexamethasone;Moist Heat;Functional mobility training;Stair training;Therapeutic activities;Therapeutic exercise;Balance training;Neuromuscular re-education;Patient/family education;Manual techniques;Dry needling;Passive range of motion;Taping;Spinal Manipulations;Joint Manipulations    PT Next Visit Plan  L shoulder PROM all planes per MD protocol.      PT Home Exercise Plan  See HEP (pulley ex.)       Patient will benefit from skilled therapeutic intervention in order to improve the following deficits and impairments:  Pain, Improper body mechanics, Decreased mobility, Increased muscle spasms, Postural dysfunction, Difficulty walking, Decreased strength,  Decreased range of motion, Decreased activity tolerance, Decreased endurance, Hypomobility  Visit Diagnosis: S/P left rotator cuff repair  Shoulder joint stiffness, left  Muscle weakness (generalized)  Acute pain of left shoulder     Problem List Patient Active Problem List   Diagnosis Date Noted  .  ED (erectile dysfunction) of organic origin 02/21/2015  . Diabetes ([place]) 10/18/2014  . Hyperlipemia 10/18/2014   Pura Spice, PT, DPT # [phone] 03/17/2018, 5:35 PM  [facility] [address] [place], [place], [address] Phone: [phone]   Fax:  [phone]  Name: Curtis Singh MRN: [id number] Date of Birth: 30-Jan-1955

## 2018-03-18 ENCOUNTER — Other Ambulatory Visit: Payer: Self-pay

## 2018-03-18 ENCOUNTER — Ambulatory Visit: Payer: No Typology Code available for payment source | Admitting: Physical Therapy

## 2018-03-18 DIAGNOSIS — Z9889 Other specified postprocedural states: Secondary | ICD-10-CM | POA: Diagnosis not present

## 2018-03-18 DIAGNOSIS — M6281 Muscle weakness (generalized): Secondary | ICD-10-CM

## 2018-03-18 DIAGNOSIS — M25612 Stiffness of left shoulder, not elsewhere classified: Secondary | ICD-10-CM

## 2018-03-18 DIAGNOSIS — M25512 Pain in left shoulder: Secondary | ICD-10-CM

## 2018-03-19 ENCOUNTER — Other Ambulatory Visit: Payer: Self-pay

## 2018-03-19 ENCOUNTER — Ambulatory Visit
Admission: RE | Admit: 2018-03-19 | Discharge: 2018-03-19 | Disposition: A | Discharge status: Home / Self Care | Payer: PRIVATE HEALTH INSURANCE | Source: Ambulatory Visit | Attending: Orthopedic Surgery | Admitting: Orthopedic Surgery

## 2018-03-19 DIAGNOSIS — M545 Low back pain, unspecified: Secondary | ICD-10-CM

## 2018-03-20 NOTE — Therapy (Signed)
[facility] [address] [place], [place], [address] Phone: [phone]   Fax:  [phone]  Physical Therapy Treatment  Patient Details  Name: Curtis Singh MRN: [id number] Date of Birth: [birth year] Referring Provider (PT): Dr. Tania Ade   Encounter Date: 03/18/2018  PT End of Session - 03/20/18 1110    Visit Number  3    Number of Visits  9    Date for PT Re-Evaluation  04/06/18    PT Start Time  0942    PT Stop Time  1040    PT Time Calculation (min)  58 min    Activity Tolerance  Patient limited by pain;Patient tolerated treatment well    Behavior During Therapy  [facility] for tasks assessed/performed       Past Medical History:  Diagnosis Date  . Diabetes ([place])    type 2 x 14 years  . ED (erectile dysfunction)   . GERD (gastroesophageal reflux disease)   . 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 Hypertension   . Seasonal allergies     Past Surgical History:  Procedure Laterality Date  . ARTHOSCOPIC ROTAOR CUFF REPAIR Left 02/25/2018   Procedure: LEFT ARTHROSCOPIC ROTATOR CUFF REPAIR; SUBACROMIAL DECOMPRESSION;  Surgeon: Tania Ade, MD;  [facility];  Service: Orthopedics;  Laterality: Left;  . CATARACT EXTRACTION W/PHACO Right 09/03/2016   Procedure: CATARACT EXTRACTION PHACO AND INTRAOCULAR LENS PLACEMENT ([place]) RIGHT DIABETIC;  Surgeon: Leandrew Koyanagi, MD;  Location: [facility];  Service: Ophthalmology;  Laterality: Right;  DIABETIC  . ROTATOR CUFF REPAIR Right 2009    There were no vitals filed for this visit.  Subjective Assessment - 03/20/18 1101    Subjective  Pt. c/o 4/10 L shoulder pain currently at rest.  Pt. has soreness in shoulder and has been icing as recommended.      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 As soon as he put the boxes down, he felt pain his R low back which has spread across the whole low back, but R>L. Patient is currently not picking up anything >20lbs and is to avoid bending, twisting, lifting.  Pt. was being treated for LBP up until L RTC repair surgery on 02/25/2018.  Pt. is hoping to return to work on 03/22/2018 with limited duty secondary to shoulder limitations/ protocol.       Limitations  Lifting;Standing;Walking;House hold activities;Sitting    Patient Stated Goals  Increase L shoulder ROM/ strengthening to promote return to work.      Currently in Pain?  Yes    Pain Score  4     Pain Location  Shoulder    Pain Orientation  Left    Pain Descriptors / Indicators  Aching;Sharp    Pain Type  Surgical pain         There.ex.:  Reviewed shoulder pulleys: flexion/ abduction 20x each with use of R UE (AA/PROM)- pain tolerable/ PT provided cuing to slow down and increase hold time Supine chest press/ triceps extension with wand 20x (AA/PROM)  Manual tx.:  Supine L shoulder PROM (all planes) per MD protocol STM to L deltoid/ STM/ proximal biceps and triceps  Educated/ started scar massage to L shoulder  Ice to L shoulder in seated position  PT Long Term Goals - 03/13/18 1840      PT LONG TERM GOAL #1   Title  Pt. will increase FOTO to 67 to improve L shoulder pain-free moblity with daily tasks.     Baseline  FOTO: initial: 36/ goal: 67.    Time  4    Period  Weeks    Status  New    Target Date  04/06/18      PT LONG TERM GOAL #2   Title  Pt. will incresae L shoulder AAROM to [facility] as compared to R shoulder to improve overhead reaching/ functional tasks.      Baseline  Supine L shoulder PROM: flexion 112 deg., abduction 88 deg., ER 0 deg., IR 75 deg.    Time  4    Period  Weeks    Status  New    Target Date  04/06/18      PT LONG TERM GOAL #3   Title  Pt. will increase L shoulder strength to grossly 4/5 MMT (flexion/  abduction/ ER) to improve progression of functional/ work-related tasks.      Baseline  No strength testing at this time.      Time  4    Period  Weeks    Status  New    Target Date  04/06/18      PT LONG TERM GOAL #4   Title  Pt. will report <3/10 L shoulder pain at worst with progression of ROM ex.    Baseline  4-5/10 L shoulder pain at rest/ >9/10 at worst (no pain medications).      Time  4    Period  Weeks    Status  New    Target Date  04/06/18            Plan - 03/20/18 1110    Clinical Impression Statement  Pt. continues to work hard and has been following MD protocol for L shoulder AA/PROM.  PT started scar massage due to no scabbing over incisions.  PROM remains pain limited at varying planes of movement but increase noted with flexion/ ER today.      Stability/Clinical Decision Making  Stable/Uncomplicated    Clinical Decision Making  Low    Rehab Potential  Excellent    PT Frequency  2x / week    PT Duration  4 weeks    PT Treatment/Interventions  Aquatic Therapy;Cryotherapy;Electrical Stimulation;Iontophoresis 4mg /ml Dexamethasone;Moist Heat;Functional mobility training;Stair training;Therapeutic activities;Therapeutic exercise;Balance training;Neuromuscular re-education;Patient/family education;Manual techniques;Dry needling;Passive range of motion;Taping;Spinal Manipulations;Joint Manipulations    PT Next Visit Plan  L shoulder PROM all planes per MD protocol.      PT Home Exercise Plan  See HEP (pulley ex.)       Patient will benefit from skilled therapeutic intervention in order to improve the following deficits and impairments:  Pain, Improper body mechanics, Decreased mobility, Increased muscle spasms, Postural dysfunction, Difficulty walking, Decreased strength, Decreased range of motion, Decreased activity tolerance, Decreased endurance, Hypomobility  Visit Diagnosis: S/P left rotator cuff repair  Shoulder joint stiffness, left  Muscle weakness  (generalized)  Acute pain of left shoulder     Problem List Patient Active Problem List   Diagnosis Date Noted  . ED (erectile dysfunction) of organic origin 02/21/2015  . Diabetes ([place]) 10/18/2014  . Hyperlipemia 10/18/2014   Pura Spice, PT, DPT # [phone] 03/20/2018, 11:14 AM  [facility] [address] [place], [place], [address] Phone: [phone]  Fax:  [phone]  Name: Curtis Singh MRN: [id number] Date of Birth: [birth year]

## 2018-03-23 ENCOUNTER — Other Ambulatory Visit: Payer: Self-pay

## 2018-03-23 ENCOUNTER — Ambulatory Visit: Payer: No Typology Code available for payment source | Admitting: Physical Therapy

## 2018-03-23 DIAGNOSIS — M6281 Muscle weakness (generalized): Secondary | ICD-10-CM

## 2018-03-23 DIAGNOSIS — M25512 Pain in left shoulder: Secondary | ICD-10-CM

## 2018-03-23 DIAGNOSIS — Z9889 Other specified postprocedural states: Secondary | ICD-10-CM | POA: Diagnosis not present

## 2018-03-23 DIAGNOSIS — M25612 Stiffness of left shoulder, not elsewhere classified: Secondary | ICD-10-CM

## 2018-03-24 ENCOUNTER — Encounter: Payer: Self-pay | Admitting: Physical Therapy

## 2018-03-24 ENCOUNTER — Ambulatory Visit: Payer: No Typology Code available for payment source

## 2018-03-24 NOTE — Therapy (Signed)
[facility] [address] [place], [place], [address] Phone: [phone]   Fax:  [phone]  Physical Therapy Treatment  Patient Details  Name: Curtis Singh MRN: [id number] Date of Birth: 1955/05/18 Referring Provider (PT): Dr. Tania Ade   Encounter Date: 03/23/2018  PT End of Session - 03/24/18 0724    Visit Number  4    Number of Visits  9    Date for PT Re-Evaluation  04/06/18    PT Start Time  1532    PT Stop Time  9326    PT Time Calculation (min)  49 min    Activity Tolerance  Patient limited by pain;Patient tolerated treatment well    Behavior During Therapy  [facility] for tasks assessed/performed       Past Medical History:  Diagnosis Date  . Diabetes ([place])    type 2 x 14 years  . ED (erectile dysfunction)   . GERD (gastroesophageal reflux disease)   . 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 Hypertension   . Seasonal allergies     Past Surgical History:  Procedure Laterality Date  . ARTHOSCOPIC ROTAOR CUFF REPAIR Left 02/25/2018   Procedure: LEFT ARTHROSCOPIC ROTATOR CUFF REPAIR; SUBACROMIAL DECOMPRESSION;  Surgeon: Tania Ade, MD;  [facility];  Service: Orthopedics;  Laterality: Left;  . CATARACT EXTRACTION W/PHACO Right 09/03/2016   Procedure: CATARACT EXTRACTION PHACO AND INTRAOCULAR LENS PLACEMENT ([place]) RIGHT DIABETIC;  Surgeon: Leandrew Koyanagi, MD;  Location: [facility];  Service: Ophthalmology;  Laterality: Right;  DIABETIC  . ROTATOR CUFF REPAIR Right 2009    There were no vitals filed for this visit.  Subjective Assessment - 03/24/18 0722    Subjective  Pt. states he is sore in L shoulder and reports 4/10 pain.  Pt. returned to work yesterday with limitations.      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 As soon as he put the boxes down, he felt pain his R low back which has spread across the whole low back, but R>L. Patient is currently not picking up anything >20lbs and is to avoid bending, twisting, lifting.  Pt. was being treated for LBP up until L RTC repair surgery on 02/25/2018.  Pt. is hoping to return to work on 03/22/2018 with limited duty secondary to shoulder limitations/ protocol.       Limitations  Lifting;Standing;Walking;House hold activities;Sitting    Patient Stated Goals  Increase L shoulder ROM/ strengthening to promote return to work.      Currently in Pain?  Yes    Pain Score  4     Pain Location  Shoulder    Pain Orientation  Left    Pain Descriptors / Indicators  Aching         There.ex.:  Seated shoulder pulleys: flexion/ abduction 20x each with use of R UE (AA/PROM). Standing wall ladder 10x L shoulder flexion/ abduction Supine chest press/ triceps extension/ ER with wand 20x (AA/PROM)- PT assist with eccentric control during chest press.  Manual tx.:  Supine L shoulder PROM (all planes) per MD protocol STM to L deltoid/ STM/ proximal biceps and triceps  Scar massage to L shoulder  Pt. Will ice at home.      PT Long Term Goals -  03/13/18 1840      PT LONG TERM GOAL #1   Title  Pt. will increase FOTO to 67 to improve L shoulder pain-free moblity with daily tasks.     Baseline  FOTO: initial: 36/ goal: 67.    Time  4    Period  Weeks    Status  New    Target Date  04/06/18      PT LONG TERM GOAL #2   Title  Pt. will incresae L shoulder AAROM to [facility] as compared to R shoulder to improve overhead reaching/ functional tasks.      Baseline  Supine L shoulder PROM: flexion 112 deg., abduction 88 deg., ER 0 deg., IR 75 deg.    Time  4    Period  Weeks    Status  New    Target Date  04/06/18      PT LONG TERM GOAL #3   Title  Pt. will increase L shoulder strength to grossly 4/5 MMT (flexion/ abduction/ ER) to improve progression of  functional/ work-related tasks.      Baseline  No strength testing at this time.      Time  4    Period  Weeks    Status  New    Target Date  04/06/18      PT LONG TERM GOAL #4   Title  Pt. will report <3/10 L shoulder pain at worst with progression of ROM ex.    Baseline  4-5/10 L shoulder pain at rest/ >9/10 at worst (no pain medications).      Time  4    Period  Weeks    Status  New    Target Date  04/06/18          Plan - 03/24/18 0725    Clinical Impression Statement  Pt. progressing well with L shoulder ROM per MD protocol.  Several episodes of L shoulder discomfort with L shoulder abduction/ eccentric muscle control during supine chest press with wand.  Marked increase in L shoulder PROM during pulley ex.  No change to HEP at this time.  Pt. encouraged to ice L shoulder 2-3x/day to manage inflammation/ pain.      Stability/Clinical Decision Making  Stable/Uncomplicated    Clinical Decision Making  Low    Rehab Potential  Excellent    PT Frequency  2x / week    PT Duration  4 weeks    PT Treatment/Interventions  Aquatic Therapy;Cryotherapy;Electrical Stimulation;Iontophoresis 4mg /ml Dexamethasone;Moist Heat;Functional mobility training;Stair training;Therapeutic activities;Therapeutic exercise;Balance training;Neuromuscular re-education;Patient/family education;Manual techniques;Dry needling;Passive range of motion;Taping;Spinal Manipulations;Joint Manipulations    PT Next Visit Plan  L shoulder PROM all planes per MD protocol.      PT Home Exercise Plan  See HEP (pulley ex.)    Consulted and Agree with Plan of Care  Patient       Patient will benefit from skilled therapeutic intervention in order to improve the following deficits and impairments:  Pain, Improper body mechanics, Decreased mobility, Increased muscle spasms, Postural dysfunction, Difficulty walking, Decreased strength, Decreased range of motion, Decreased activity tolerance, Decreased endurance,  Hypomobility  Visit Diagnosis: S/P left rotator cuff repair  Shoulder joint stiffness, left  Muscle weakness (generalized)  Acute pain of left shoulder     Problem List Patient Active Problem List   Diagnosis Date Noted  . ED (erectile dysfunction) of organic origin 02/21/2015  . Diabetes ([place]) 10/18/2014  . Hyperlipemia 10/18/2014   Pura Spice, PT, DPT # [phone]  03/24/2018, 7:27 AM  [place] [facility] [address] [place], [place], [address] Phone: [phone]   Fax:  [phone]  Name: Curtis Singh MRN: [id number] Date of Birth: 09-Nov-1955

## 2018-03-25 ENCOUNTER — Ambulatory Visit: Payer: No Typology Code available for payment source | Admitting: Physical Therapy

## 2018-03-25 ENCOUNTER — Other Ambulatory Visit: Payer: Self-pay

## 2018-03-25 DIAGNOSIS — Z9889 Other specified postprocedural states: Secondary | ICD-10-CM | POA: Diagnosis not present

## 2018-03-25 DIAGNOSIS — M6281 Muscle weakness (generalized): Secondary | ICD-10-CM

## 2018-03-25 DIAGNOSIS — M25512 Pain in left shoulder: Secondary | ICD-10-CM

## 2018-03-25 DIAGNOSIS — M25612 Stiffness of left shoulder, not elsewhere classified: Secondary | ICD-10-CM

## 2018-03-26 ENCOUNTER — Ambulatory Visit: Payer: Self-pay | Admitting: Cardiovascular Disease

## 2018-03-27 ENCOUNTER — Encounter: Payer: Self-pay | Admitting: Physical Therapy

## 2018-03-27 NOTE — Therapy (Addendum)
[facility] [address] [place], [place], [address] Phone: [phone]   Fax:  [phone]  Physical Therapy Treatment  Patient Details  Name: Curtis Singh MRN: [id number] Date of Birth: 02/10/1955 Referring Provider (PT): Dr. Tania Ade   Encounter Date: 03/25/2018  PT End of Session - 05/01/18 1708    Visit Number  5    Number of Visits  9    Date for PT Re-Evaluation  04/06/18    PT Start Time  6962    PT Stop Time  9528    PT Time Calculation (min)  52 min    Activity Tolerance  Patient limited by pain;Patient tolerated treatment well    Behavior During Therapy  [facility] for tasks assessed/performed       Past Medical History:  Diagnosis Date  . Diabetes ([place])    type 2 x 14 years  . ED (erectile dysfunction)   . GERD (gastroesophageal reflux disease)   . 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 Hypertension   . Seasonal allergies     Past Surgical History:  Procedure Laterality Date  . ARTHOSCOPIC ROTAOR CUFF REPAIR Left 02/25/2018   Procedure: LEFT ARTHROSCOPIC ROTATOR CUFF REPAIR; SUBACROMIAL DECOMPRESSION;  Surgeon: Tania Ade, MD;  Location: [facility];  Service: Orthopedics;  Laterality: Left;  . CATARACT EXTRACTION W/PHACO Right 09/03/2016   Procedure: CATARACT EXTRACTION PHACO AND INTRAOCULAR LENS PLACEMENT ([place]) RIGHT DIABETIC;  Surgeon: Leandrew Koyanagi, MD;  Location: [place];  Service: Ophthalmology;  Laterality: Right;  DIABETIC  . ROTATOR CUFF REPAIR Right 2009    There were no vitals filed for this visit.  Subjective Assessment - 05/01/18 1706    Subjective  Pt. states work is going okay but found out the outpatient surgical center is closing down for several weeks secondary the Coronavirus.      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 As soon as he put the boxes down, he felt pain his R low back which has spread across the whole low back, but R>L. Patient is currently not picking up anything >20lbs and is to avoid bending, twisting, lifting.  Pt. was being treated for LBP up until L RTC repair surgery on 02/25/2018.  Pt. is hoping to return to work on 03/22/2018 with limited duty secondary to shoulder limitations/ protocol.       Limitations  Lifting;Standing;Walking;House hold activities;Sitting    Patient Stated Goals  Increase L shoulder ROM/ strengthening to promote return to work.      Currently in Pain?  Yes    Pain Score  4     Pain Location  Shoulder    Pain Orientation  Left    Pain Descriptors / Indicators  Aching    Pain Type  Surgical pain          There.ex.:  Seated shoulder pulleys: flexion/ abduction 20x each with use of R UE (AA/PROM). Standing wall ladder 10x L shoulder flexion/ abduction Supine chest press/ triceps extension/ ERwith wand 20x (AA/PROM)- PT assist with eccentric control during chest press.  See handouts  Manual tx.:  Supine L shoulder PROM (all planes) per MD protocol STM to L deltoid/ STM/ proximal biceps and triceps Scar massage to L shoulder  Pt. Will  ice at home.      PT Education - 05/01/18 1708    Education Details  Reviewed HEP in depth.  PT will provide pt. with exercises as needed while at home from work.     Person(s) Educated  Patient    Methods  Explanation;Demonstration;Handout    Comprehension  Verbalized understanding;Returned demonstration          PT Long Term Goals - 03/13/18 1840      PT LONG TERM GOAL #1   Title  Pt. will increase FOTO to 67 to improve L shoulder pain-free moblity with daily tasks.     Baseline  FOTO: initial: 36/ goal: 67.    Time  4    Period  Weeks    Status  New    Target Date  04/06/18      PT LONG TERM GOAL #2   Title  Pt. will incresae L shoulder AAROM to [facility] as compared  to R shoulder to improve overhead reaching/ functional tasks.      Baseline  Supine L shoulder PROM: flexion 112 deg., abduction 88 deg., ER 0 deg., IR 75 deg.    Time  4    Period  Weeks    Status  New    Target Date  04/06/18      PT LONG TERM GOAL #3   Title  Pt. will increase L shoulder strength to grossly 4/5 MMT (flexion/ abduction/ ER) to improve progression of functional/ work-related tasks.      Baseline  No strength testing at this time.      Time  4    Period  Weeks    Status  New    Target Date  04/06/18      PT LONG TERM GOAL #4   Title  Pt. will report <3/10 L shoulder pain at worst with progression of ROM ex.    Baseline  4-5/10 L shoulder pain at rest/ >9/10 at worst (no pain medications).      Time  4    Period  Weeks    Status  New    Target Date  04/06/18            Plan - 05/01/18 1709    Clinical Impression Statement  Pt. continues to work hard during tx. sessiong and is progressing well with L shoulder ROM per MD protocol. L shoulder discomfort with L shoulder abduction/ eccentric muscle control during supine chest press with wand. Marked increase in L shoulder AA/PROM during pulley ex.  See HEP handouts at this time. Pt. encouraged to ice L shoulder to manage inflammation/ pain at home and work.     Stability/Clinical Decision Making  Stable/Uncomplicated    Clinical Decision Making  Low    PT Frequency  2x / week    PT Duration  4 weeks    PT Treatment/Interventions  Aquatic Therapy;Cryotherapy;Electrical Stimulation;Iontophoresis 4mg /ml Dexamethasone;Moist Heat;Functional mobility training;Stair training;Therapeutic activities;Therapeutic exercise;Balance training;Neuromuscular re-education;Patient/family education;Manual techniques;Dry needling;Passive range of motion;Taping;Spinal Manipulations;Joint Manipulations    PT Next Visit Plan  L shoulder PROM all planes per MD protocol.  Pt. instructed to contact PT if any issues over staying home/ not  attending PT       Patient will benefit from skilled therapeutic intervention in order to improve the following deficits and impairments:  Pain, Improper body mechanics, Decreased mobility, Increased muscle spasms, Postural dysfunction, Difficulty walking, Decreased strength, Decreased range of motion, Decreased activity tolerance, Decreased endurance, Hypomobility  Visit Diagnosis: S/P  left rotator cuff repair  Shoulder joint stiffness, left  Muscle weakness (generalized)  Acute pain of left shoulder     Problem List Patient Active Problem List   Diagnosis Date Noted  . ED (erectile dysfunction) of organic origin 02/21/2015  . Diabetes ([place]) 10/18/2014  . Hyperlipemia 10/18/2014   Pura Spice, PT, DPT # [phone] 05/01/2018, 5:12 PM  [facility] [address] [place], [place], [address] Phone: [phone]   Fax:  [phone]  Name: Curtis Singh MRN: [id number] Date of Birth: Sep 03, 1955

## 2018-03-30 ENCOUNTER — Encounter: Payer: Self-pay | Admitting: Physical Therapy

## 2018-04-01 ENCOUNTER — Encounter: Payer: Self-pay | Admitting: Physical Therapy

## 2018-04-06 ENCOUNTER — Encounter: Payer: Self-pay | Admitting: Physical Therapy

## 2018-04-13 ENCOUNTER — Encounter: Payer: Self-pay | Admitting: Physical Therapy

## 2018-04-15 ENCOUNTER — Encounter: Payer: Self-pay | Admitting: Physical Therapy

## 2018-04-20 ENCOUNTER — Encounter: Payer: Self-pay | Admitting: Physical Therapy

## 2018-04-22 ENCOUNTER — Encounter: Payer: Self-pay | Admitting: Physical Therapy

## 2018-04-27 ENCOUNTER — Encounter: Payer: Self-pay | Admitting: Physical Therapy

## 2018-04-29 ENCOUNTER — Encounter: Payer: Self-pay | Admitting: Physical Therapy

## 2018-05-04 ENCOUNTER — Ambulatory Visit: Payer: No Typology Code available for payment source | Attending: Orthopedic Surgery | Admitting: Physical Therapy

## 2018-05-06 ENCOUNTER — Ambulatory Visit: Payer: No Typology Code available for payment source | Admitting: Physical Therapy

## 2018-05-17 ENCOUNTER — Other Ambulatory Visit: Payer: Self-pay

## 2018-05-17 ENCOUNTER — Ambulatory Visit: Payer: No Typology Code available for payment source | Attending: Orthopedic Surgery

## 2018-05-17 DIAGNOSIS — M25612 Stiffness of left shoulder, not elsewhere classified: Secondary | ICD-10-CM | POA: Diagnosis present

## 2018-05-17 DIAGNOSIS — M6281 Muscle weakness (generalized): Secondary | ICD-10-CM | POA: Insufficient documentation

## 2018-05-17 DIAGNOSIS — M25512 Pain in left shoulder: Secondary | ICD-10-CM | POA: Insufficient documentation

## 2018-05-17 DIAGNOSIS — Z9889 Other specified postprocedural states: Secondary | ICD-10-CM | POA: Insufficient documentation

## 2018-05-17 NOTE — Therapy (Signed)
[place] [facility] [address] [place], [place], [address] Phone: [phone]   Fax:  [phone]  Physical Therapy Treatment/Recertification  Patient Details  Name: Curtis Singh MRN: [id number] Date of Birth: May 01, 1955 Referring Provider (PT): Dr. Tania Ade   Encounter Date: 05/17/2018  PT End of Session - 05/17/18 1516    Visit Number  6    Number of Visits  17    Date for PT Re-Evaluation  06/14/18    PT Start Time  1510    PT Stop Time  1555    PT Time Calculation (min)  45 min    Activity Tolerance  Patient tolerated treatment well    Behavior During Therapy  [facility] for tasks assessed/performed       Past Medical History:  Diagnosis Date  . Diabetes ([place])    type 2 x 14 years  . ED (erectile dysfunction)   . GERD (gastroesophageal reflux disease)   . 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 Hypertension   . Seasonal allergies     Past Surgical History:  Procedure Laterality Date  . ARTHOSCOPIC ROTAOR CUFF REPAIR Left 02/25/2018   Procedure: LEFT ARTHROSCOPIC ROTATOR CUFF REPAIR; SUBACROMIAL DECOMPRESSION;  Surgeon: Tania Ade, MD;  [facility];  Service: Orthopedics;  Laterality: Left;  . CATARACT EXTRACTION W/PHACO Right 09/03/2016   Procedure: CATARACT EXTRACTION PHACO AND INTRAOCULAR LENS PLACEMENT ([place]) RIGHT DIABETIC;  Surgeon: Leandrew Koyanagi, MD;  Location: [place];  Service: Ophthalmology;  Laterality: Right;  DIABETIC  . ROTATOR CUFF REPAIR Right 2009    There were no vitals filed for this visit.  Subjective Assessment - 05/17/18 1516    Subjective  Pt reports that he is doing well at this time. He has continued to be consistent with his HEP and is motivated to improve so he can participate in a motorcycle trip in July. No pain upon arrival today and no specific questions or concerns.     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 He was moving boxes while working and he picked 2 boxes up from roughly waist/chest height and rotated L with the boxes to set them down. As soon as he put the boxes down, he felt pain his R low back which has spread across the whole low back, but R>L. Patient is currently not picking up anything >20lbs and is to avoid bending, twisting, lifting.  Pt. was being treated for LBP up until L RTC repair surgery on 02/25/2018.  Pt. is hoping to return to work on 03/22/2018 with limited duty secondary to shoulder limitations/ protocol.       Limitations  Lifting;Standing;Walking;House hold activities;Sitting    Patient Stated Goals  Increase L shoulder ROM/ strengthening to promote return to work.      Currently in Pain?  No/denies          TREATMENT   Ther-ex  Supine L shoulder rythmic stabs 30s/bout x 2 bouts; Supine L serratus punch 2 x 10 with manual resistance from therapist; R sidelying L shoulder scaption with 2# dumbbell (DB) 2 x 10; R sidelying L shoulder ER with 2# DB 2 x 10; Supine L shoulder PROM: flexion 155 deg., abduction 150 deg., ER 60 deg., IR 72 deg;  Updated goals with patient;   Manual Therapy  Supine L shoulder PROM (all planes) per MD protocol Supine L shoulder clavicle on acromion A/P and  inferior mobs, grade II-III, 30s/bout x 3 bouts; Supine L shoulder anterior to posterior mobilizations at neutral, grade II-III, 30s/bout x 3 bouts; Supine L shoulder posterior and inferior mobilizations at available end range flexion, grade II-III, 30s/bout x 3 bouts; Supine L shoulder anterior to posterior mobilizations at 90 abduction and available end range ER, grade II-III, 30s/bout x 3 bouts; Supine L shoulder inferior mobilizations at 100 scaption, grade II-III, 30s/bout x 3 bouts;    Pt educated throughout session about proper posture and technique with exercises. Improved exercise technique, movement at target joints, use of target muscles after min to mod verbal,  visual, tactile cues.     Pt is making excellent progress with therapy. He has been able to resume a lot of functional activities with use of his L shoulder at home. His L shoulder AROM/PROM has improved significantly and currently he is most limited in external rotation. He reports that his L shoulder pain continues to get to a 4-5/10 at it's worst. Pt will benefit from PT services to address deficits in strength and range of motion in order to return to full function at home and work without pain.                       PT Long Term Goals - 05/17/18 1519      PT LONG TERM GOAL #1   Title  Pt. will increase FOTO to 67 to improve L shoulder pain-free moblity with daily tasks.     Baseline  FOTO: initial: 36/ goal: 68; 05/17/18: Not performed    Time  4    Period  Weeks    Status  Deferred    Target Date  06/14/18      PT LONG TERM GOAL #2   Title  Pt. will incresae L shoulder AAROM to [facility] as compared to R shoulder to improve overhead reaching/ functional tasks.      Baseline  Supine L shoulder PROM: flexion 112 deg., abduction 88 deg., ER 0 deg., IR 75 deg; 05/17/18: Supine L shoulder PROM: flexion 155 deg., abduction 150 deg., ER 60 deg., IR 72 deg;     Time  4    Period  Weeks    Status  Partially Met    Target Date  06/14/18      PT LONG TERM GOAL #3   Title  Pt. will increase L shoulder strength to grossly 4/5 MMT (flexion/ abduction/ ER) to improve progression of functional/ work-related tasks.      Baseline  No strength testing at this time; 05/17/18: Deferred strength testing     Time  4    Period  Weeks    Status  Deferred    Target Date  06/14/18      PT LONG TERM GOAL #4   Title  Pt. will report <3/10 L shoulder pain at worst with progression of ROM ex.    Baseline  4-5/10 L shoulder pain at rest/ >9/10 at worst (no pain medications); 05/17/18: worst pain 4-5/10;    Time  4    Period  Weeks    Status  Partially Met    Target Date  06/14/18             Plan - 05/17/18 1517    Clinical Impression Statement  Pt is making excellent progress with therapy. He has been able to resume a lot of functional activities with use of his L shoulder at home. His L  shoulder AROM/PROM has improved significantly and currently he is most limited in external rotation. He reports that his L shoulder pain continues to get to a 4-5/10 at it's worst. Pt will benefit from PT services to address deficits in strength and range of motion in order to return to full function at home and work without pain.    Stability/Clinical Decision Making  Stable/Uncomplicated    PT Frequency  2x / week    PT Duration  4 weeks    PT Treatment/Interventions  Aquatic Therapy;Cryotherapy;Electrical Stimulation;Iontophoresis 24m/ml Dexamethasone;Moist Heat;Functional mobility training;Stair training;Therapeutic activities;Therapeutic exercise;Balance training;Neuromuscular re-education;Patient/family education;Manual techniques;Dry needling;Passive range of motion;Taping;Spinal Manipulations;Joint Manipulations    PT Next Visit Plan  L shoulder PROM all planes per MD protocol.  Pt. instructed to contact PT if any issues over staying home/ not attending PT       Patient will benefit from skilled therapeutic intervention in order to improve the following deficits and impairments:  Pain, Improper body mechanics, Decreased mobility, Increased muscle spasms, Postural dysfunction, Difficulty walking, Decreased strength, Decreased range of motion, Decreased activity tolerance, Decreased endurance, Hypomobility  Visit Diagnosis: S/P left rotator cuff repair - Plan: PT plan of care cert/re-cert  Shoulder joint stiffness, left - Plan: PT plan of care cert/re-cert  Muscle weakness (generalized) - Plan: PT plan of care cert/re-cert  Acute pain of left shoulder - Plan: PT plan of care cert/re-cert     Problem List Patient Active Problem List   Diagnosis Date Noted  . ED (erectile  dysfunction) of organic origin 02/21/2015  . Diabetes ([place] 10/18/2014  . Hyperlipemia 10/18/2014   JPhillips GroutPT, DPT, GCS  Neythan Kozlov 05/18/2018, 12:38 PM  [facility] [address]RTuck[place] [address]Phone: [phone]  Fax:  [phone] Name: Curtis PANNINGMRN: [id number] of Birth: 111-26-1957

## 2018-05-19 ENCOUNTER — Ambulatory Visit: Payer: No Typology Code available for payment source

## 2018-05-19 ENCOUNTER — Encounter: Payer: Self-pay | Admitting: Physical Therapy

## 2018-05-19 ENCOUNTER — Other Ambulatory Visit: Payer: Self-pay

## 2018-05-19 DIAGNOSIS — M6281 Muscle weakness (generalized): Secondary | ICD-10-CM

## 2018-05-19 DIAGNOSIS — Z9889 Other specified postprocedural states: Secondary | ICD-10-CM | POA: Diagnosis not present

## 2018-05-19 DIAGNOSIS — M25612 Stiffness of left shoulder, not elsewhere classified: Secondary | ICD-10-CM

## 2018-05-19 NOTE — Therapy (Signed)
[facility] [address] [place], [place], [address] Phone: [phone]   Fax:  [phone]  Physical Therapy Treatment  Patient Details  Name: Curtis Singh MRN: [id number] Date of Birth: 10/25/1955 Referring Provider (PT): Dr. Tania Ade   Encounter Date: 05/19/2018  PT End of Session - 05/19/18 1507    Visit Number  7    Number of Visits  17    Date for PT Re-Evaluation  06/14/18    PT Start Time  1503    PT Stop Time  1550    PT Time Calculation (min)  47 min    Activity Tolerance  Patient tolerated treatment well    Behavior During Therapy  [facility] for tasks assessed/performed       Past Medical History:  Diagnosis Date  . Diabetes ([place])    type 2 x 14 years  . ED (erectile dysfunction)   . GERD (gastroesophageal reflux disease)   . 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 Hypertension   . Seasonal allergies     Past Surgical History:  Procedure Laterality Date  . ARTHOSCOPIC ROTAOR CUFF REPAIR Left 02/25/2018   Procedure: LEFT ARTHROSCOPIC ROTATOR CUFF REPAIR; SUBACROMIAL DECOMPRESSION;  Surgeon: Tania Ade, MD;  [facility];  Service: Orthopedics;  Laterality: Left;  . CATARACT EXTRACTION W/PHACO Right 09/03/2016   Procedure: CATARACT EXTRACTION PHACO AND INTRAOCULAR LENS PLACEMENT ([place]) RIGHT DIABETIC;  Surgeon: Leandrew Koyanagi, MD;  Location: [place];  Service: Ophthalmology;  Laterality: Right;  DIABETIC  . ROTATOR CUFF REPAIR Right 2009    There were no vitals filed for this visit.  Subjective Assessment - 05/19/18 1507    Subjective  Pt reports that he is doing well at this time. He was a little sore yesterday but feels improved today. Reports 1/10 L shoulder pain upon arrival today. No pain upon arrival today and no specific questions or concerns.     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 As soon as he put the boxes down, he felt pain his R low back which has spread across the whole low back, but R>L. Patient is currently not picking up anything >20lbs and is to avoid bending, twisting, lifting.  Pt. was being treated for LBP up until L RTC repair surgery on 02/25/2018.  Pt. is hoping to return to work on 03/22/2018 with limited duty secondary to shoulder limitations/ protocol.       Limitations  Lifting;Standing;Walking;House hold activities;Sitting    Patient Stated Goals  Increase L shoulder ROM/ strengthening to promote return to work.      Currently in Pain?  Yes    Pain Score  1     Pain Location  Shoulder    Pain Orientation  Left    Pain Descriptors / Indicators  Aching    Pain Type  Surgical pain    Pain Onset  More than a month ago    Pain Frequency  Intermittent          TREATMENT  Ther-ex  Supine L shoulder rythmic stabs 30s/bout x 2 bouts; Supine L serratus punch 2 x 10 with manual resistance from therapist; Supine L shoulder flexion with 2# dumbbell (DB) 2 x 10;  Supine L shoulder cirlces at  90 flexion with 2# DB CW/CCW 2 x 10 each; R sidelying L shoulder scaption with 2# DB x 10; R sidelying L shoulder ER with 1# DB x 10; R sidelying L shoulder horizontal abduction unweighted x 10; Prone L shoulder extension with 2# DB x 10; Prone L shoulder midtrap row with 2# DB x 10;   Manual Therapy  Supine L shoulder PROM (all planes) per MD protocol Supine L shoulder clavicle on acromion A/P and inferior mobs, grade II-III, 30s/bout x 3 bouts; Supine L shoulder anterior to posterior mobilizations at neutral, grade II-III, 30s/bout x 3 bouts; Supine L shoulder posterior and inferior mobilizations at available end range flexion, grade II-III, 30s/bout x 3 bouts; Supine L shoulder anterior to posterior mobilizations at 90 abduction and available end range ER,  grade II-III, 30s/bout x 3 bouts; Supine L shoulder inferior mobilizations at 100 scaption, grade II-III, 30s/bout x 3 bouts; Prone L shoulder posterior to anterior mobilizations at 90 abduction, grade II, 30s/bout x 2 bouts;    Pt educated throughout session about proper posture and technique with exercises. Improved exercise technique, movement at target joints, use of target muscles after min to mod verbal, visual, tactile cues.     Pt is making excellent progress with therapy. He is able to perform all exercises as instructed today and reports very mild pain with R sidelying L shoulder abduction so only one set performed. He is able to progress to some additional sidelying and prone strengthening exercises. Pt encouraged to continue HEP. Pt will benefit from PT services to address deficits in strength and range of motion in order to return to full function at home and work without pain.                           PT Long Term Goals - 05/17/18 1519      PT LONG TERM GOAL #1   Title  Pt. will increase FOTO to 67 to improve L shoulder pain-free moblity with daily tasks.     Baseline  FOTO: initial: 36/ goal: 34; 05/17/18: Not performed    Time  4    Period  Weeks    Status  Deferred    Target Date  06/14/18      PT LONG TERM GOAL #2   Title  Pt. will incresae L shoulder AAROM to [facility] as compared to R shoulder to improve overhead reaching/ functional tasks.      Baseline  Supine L shoulder PROM: flexion 112 deg., abduction 88 deg., ER 0 deg., IR 75 deg; 05/17/18: Supine L shoulder PROM: flexion 155 deg., abduction 150 deg., ER 60 deg., IR 72 deg;     Time  4    Period  Weeks    Status  Partially Met    Target Date  06/14/18      PT LONG TERM GOAL #3   Title  Pt. will increase L shoulder strength to grossly 4/5 MMT (flexion/ abduction/ ER) to improve progression of functional/ work-related tasks.      Baseline  No strength testing at this time; 05/17/18: Deferred  strength testing     Time  4    Period  Weeks    Status  Deferred    Target Date  06/14/18      PT LONG TERM GOAL #4   Title  Pt. will report <3/10 L shoulder pain at worst with progression of ROM ex.    Baseline  4-5/10 L shoulder pain at rest/ >9/10 at worst (no pain medications); 05/17/18: worst pain 4-5/10;    Time  4    Period  Weeks    Status  Partially Met    Target Date  06/14/18            Plan - 05/19/18 1508    Clinical Impression Statement  Pt is making excellent progress with therapy. He is able to perform all exercises as instructed today and reports very mild pain with R sidelying L shoulder abduction so only one set performed. He is able to progress to some additional sidelying and prone strengthening exercises. Pt encouraged to continue HEP. Pt will benefit from PT services to address deficits in strength and range of motion in order to return to full function at home and work without pain.     Stability/Clinical Decision Making  Stable/Uncomplicated    PT Frequency  2x / week    PT Duration  4 weeks    PT Treatment/Interventions  Aquatic Therapy;Cryotherapy;Electrical Stimulation;Iontophoresis 69m/ml Dexamethasone;Moist Heat;Functional mobility training;Stair training;Therapeutic activities;Therapeutic exercise;Balance training;Neuromuscular re-education;Patient/family education;Manual techniques;Dry needling;Passive range of motion;Taping;Spinal Manipulations;Joint Manipulations    PT Next Visit Plan  L shoulder PROM all planes per MD protocol.  Pt. instructed to contact PT if any issues over staying home/ not attending PT       Patient will benefit from skilled therapeutic intervention in order to improve the following deficits and impairments:  Pain, Improper body mechanics, Decreased mobility, Increased muscle spasms, Postural dysfunction, Difficulty walking, Decreased strength, Decreased range of motion, Decreased activity tolerance, Decreased endurance,  Hypomobility  Visit Diagnosis: S/P left rotator cuff repair  Shoulder joint stiffness, left  Muscle weakness (generalized)     Problem List Patient Active Problem List   Diagnosis Date Noted  . ED (erectile dysfunction) of organic origin 02/21/2015  . Diabetes ([place] 10/18/2014  . Hyperlipemia 10/18/2014   JPhillips GroutPT, DPT, GCS  Huprich,Jason 05/19/2018, 4:15 PM  [place] [facility] [place] [address]Phone: [phone]  Fax:  [phone] Name: Curtis VANBENSCHOTENMRN: [id number] of Birth: [birth year]

## 2018-05-24 ENCOUNTER — Other Ambulatory Visit: Payer: Self-pay

## 2018-05-24 ENCOUNTER — Ambulatory Visit: Payer: No Typology Code available for payment source

## 2018-05-24 ENCOUNTER — Encounter: Payer: Self-pay | Admitting: Physical Therapy

## 2018-05-24 DIAGNOSIS — Z9889 Other specified postprocedural states: Secondary | ICD-10-CM | POA: Diagnosis not present

## 2018-05-24 DIAGNOSIS — M25612 Stiffness of left shoulder, not elsewhere classified: Secondary | ICD-10-CM

## 2018-05-24 DIAGNOSIS — M6281 Muscle weakness (generalized): Secondary | ICD-10-CM

## 2018-05-24 NOTE — Therapy (Signed)
[facility] [address] [place], [place], [address] Phone: [phone]   Fax:  [phone]  Physical Therapy Treatment  Patient Details  Name: Curtis Singh MRN: [id number] Date of Birth: 27-Feb-1955 Referring Provider (PT): Dr. Tania Ade   Encounter Date: 05/24/2018  PT End of Session - 05/24/18 1518    Visit Number  8    Number of Visits  17    Date for PT Re-Evaluation  06/14/18    PT Start Time  3762    PT Stop Time  1600    PT Time Calculation (min)  45 min    Activity Tolerance  Patient tolerated treatment well    Behavior During Therapy  [facility] for tasks assessed/performed       Past Medical History:  Diagnosis Date  . Diabetes ([place])    type 2 x 14 years  . ED (erectile dysfunction)   . GERD (gastroesophageal reflux disease)   . 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 Hypertension   . Seasonal allergies     Past Surgical History:  Procedure Laterality Date  . ARTHOSCOPIC ROTAOR CUFF REPAIR Left 02/25/2018   Procedure: LEFT ARTHROSCOPIC ROTATOR CUFF REPAIR; SUBACROMIAL DECOMPRESSION;  Surgeon: Tania Ade, MD;  Location: [facility];  Service: Orthopedics;  Laterality: Left;  . CATARACT EXTRACTION W/PHACO Right 09/03/2016   Procedure: CATARACT EXTRACTION PHACO AND INTRAOCULAR LENS PLACEMENT ([place]) RIGHT DIABETIC;  Surgeon: Leandrew Koyanagi, MD;  Location: [place];  Service: Ophthalmology;  Laterality: Right;  DIABETIC  . ROTATOR CUFF REPAIR Right 2009    There were no vitals filed for this visit.  Subjective Assessment - 05/24/18 1517    Subjective  Pt reports that he is doing well at this time. He denies any L shoulder pain upon arrival. He has been performing HEP without issue. He has noted improved L shoulder ER. No specific questions or concerns.     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 As soon as he put the boxes down, he felt pain his R low back which has spread across the whole low back, but R>L. Patient is currently not picking up anything >20lbs and is to avoid bending, twisting, lifting.  Pt. was being treated for LBP up until L RTC repair surgery on 02/25/2018.  Pt. is hoping to return to work on 03/22/2018 with limited duty secondary to shoulder limitations/ protocol.       Limitations  Lifting;Standing;Walking;House hold activities;Sitting    Patient Stated Goals  Increase L shoulder ROM/ strengthening to promote return to work.      Currently in Pain?  No/denies            TREATMENT   Ther-ex Supine L shoulder AAROM canes for flexion and ER x 20 each; Supine chest press with cane, therapist providing resistance 2 x 10; SupineL shoulderrythmic stabs 30s/bout x 2 bouts; SupineLserratus punch 2 x 10 with manual resistance from therapist; Supine L shoulder flexion with 2# dumbbell (DB) 2 x 10;  Supine L shoulder circles at 90 flexion with 2# DB CW/CCW x 10 each; R sidelyingLshoulder scaption with 2# DB 2 x 10; R sidelyingLshoulder ER with 2# DB 2 x 10; R sidelying L shoulder horizontal abduction with 1# DB x 10;  Pt complains of intermittent vertigo when rolling in bed. Dix-Hallpike performed bilaterally which is negative on the L and positive on the R for vertigo and upbeating R torsional nystagmus with 3-5s latency and only 8-12 beats. Education regarding BPPV   Manual Therapy Supine L shoulder PROM (all planes) per MD protocol Supine L shoulderclavicle on acromionA/P and inferior mobs, grade II-III, 30s/bout x 2 bouts each; Supine L [place] joint inferior mobs, grade III, 30s/bout x 1 bout; Supine L shoulderanterior to posteriormobilizations at neutral, grade II-III, 30s/bout x 3 bouts; Supine L shoulderposterior and inferiormobilizations at available end  range flexion, grade II-III, 30s/bout x 3 bouts; Supine L shoulderanterior to posteriormobilizations at 90 abduction and available end range ER, grade II-III, 30s/bout x 3 bouts; Supine L shoulder inferior mobilizations at 100 scaption, grade II-III, 30s/bout x 3 bouts;   Pt educated throughout session about proper posture and technique with exercises. Improved exercise technique, movement at target joints, use of target muscles after min to mod verbal, visual, tactile cues.   Pt is making excellent progress with therapy. He is able to perform all exercises as instructed today and reports very mild pain with R sidelying L shoulder abduction but less pain than last session. He is able to add additional weight to some of his sidelying exercises. Pt complains of intermittent vertigo when rolling in bed. Dix-Hallpike performed bilaterally which is negative on the L and positive on the R for vertigo and upbeating R torsional nystagmus with 3-5s latency and only 8-12 beats. Education regarding BPPV performed with patient and this can be treated at a later date but pt will need canalith repositioning treatment added to his next recertification. Pt encouraged to continue HEP.Pt will benefit from PT services to address deficits in strengthand range of motionin order to return to full function at home and work without pain.                        PT Long Term Goals - 05/17/18 1519      PT LONG TERM GOAL #1   Title  Pt. will increase FOTO to 67 to improve L shoulder pain-free moblity with daily tasks.     Baseline  FOTO: initial: 36/ goal: 74; 05/17/18: Not performed    Time  4    Period  Weeks    Status  Deferred    Target Date  06/14/18      PT LONG TERM GOAL #2   Title  Pt. will incresae L shoulder AAROM to [facility] as compared to R shoulder to improve overhead reaching/ functional tasks.      Baseline  Supine L shoulder PROM: flexion 112 deg., abduction 88 deg., ER 0 deg., IR  75 deg; 05/17/18: Supine L shoulder PROM: flexion 155 deg., abduction 150 deg., ER 60 deg., IR 72 deg;     Time  4    Period  Weeks    Status  Partially Met    Target Date  06/14/18      PT LONG TERM GOAL #3   Title  Pt. will increase L shoulder strength to grossly 4/5 MMT (flexion/ abduction/ ER) to improve progression of functional/ work-related tasks.      Baseline  No strength testing at this time; 05/17/18: Deferred strength testing     Time  4    Period  Weeks    Status  Deferred    Target Date  06/14/18      PT  LONG TERM GOAL #4   Title  Pt. will report <3/10 L shoulder pain at worst with progression of ROM ex.    Baseline  4-5/10 L shoulder pain at rest/ >9/10 at worst (no pain medications); 05/17/18: worst pain 4-5/10;    Time  4    Period  Weeks    Status  Partially Met    Target Date  06/14/18            Plan - 05/24/18 1518    Clinical Impression Statement  Pt is making excellent progress with therapy. He is able to perform all exercises as instructed today and reports very mild pain with R sidelying L shoulder abduction but less pain than last session. He is able to add additional weight to some of his sidelying exercises. Pt complains of intermittent vertigo when rolling in bed. Dix-Hallpike performed bilaterally which is negative on the L and positive on the R for vertigo and upbeating R torsional nystagmus with 3-5s latency and only 8-12 beats. Education regarding BPPV performed with patient and this can be treated at a later date but pt will need canalith repositioning treatment added to his next recertification. Pt encouraged to continue HEP.Pt will benefit from PT services to address deficits in strengthand range of motionin order to return to full function at home and work without pain.    Stability/Clinical Decision Making  Stable/Uncomplicated    PT Frequency  2x / week    PT Duration  4 weeks    PT Treatment/Interventions  Aquatic  Therapy;Cryotherapy;Electrical Stimulation;Iontophoresis 65m/ml Dexamethasone;Moist Heat;Functional mobility training;Stair training;Therapeutic activities;Therapeutic exercise;Balance training;Neuromuscular re-education;Patient/family education;Manual techniques;Dry needling;Passive range of motion;Taping;Spinal Manipulations;Joint Manipulations    PT Next Visit Plan  L shoulder PROM all planes per MD protocol.  Pt. instructed to contact PT if any issues over staying home/ not attending PT       Patient will benefit from skilled therapeutic intervention in order to improve the following deficits and impairments:  Pain, Improper body mechanics, Decreased mobility, Increased muscle spasms, Postural dysfunction, Difficulty walking, Decreased strength, Decreased range of motion, Decreased activity tolerance, Decreased endurance, Hypomobility  Visit Diagnosis: S/P left rotator cuff repair  Shoulder joint stiffness, left  Muscle weakness (generalized)     Problem List Patient Active Problem List   Diagnosis Date Noted  . ED (erectile dysfunction) of organic origin 02/21/2015  . Diabetes ([place] 10/18/2014  . Hyperlipemia 10/18/2014   JPhillips GroutPT, DPT, GCS  Raelan Burgoon 05/25/2018, 11:35 AM  [place] [facility] [place] [address]Phone: [phone]  Fax:  [phone] Name: GRAYMONDO GARCIALOPEZMRN: [id number] of Birth: [birth year]

## 2018-05-26 ENCOUNTER — Other Ambulatory Visit: Payer: Self-pay

## 2018-05-26 ENCOUNTER — Encounter: Payer: Self-pay | Admitting: Physical Therapy

## 2018-05-26 ENCOUNTER — Ambulatory Visit: Payer: No Typology Code available for payment source

## 2018-05-26 DIAGNOSIS — Z9889 Other specified postprocedural states: Secondary | ICD-10-CM | POA: Diagnosis not present

## 2018-05-26 DIAGNOSIS — M25612 Stiffness of left shoulder, not elsewhere classified: Secondary | ICD-10-CM

## 2018-05-26 DIAGNOSIS — M6281 Muscle weakness (generalized): Secondary | ICD-10-CM

## 2018-05-26 NOTE — Therapy (Signed)
[place] [facility] [address]. [place], [place], [address] Phone: [phone]   Fax:  [phone]  Physical Therapy Treatment  Patient Details  Name: Curtis Singh MRN: [id number] Date of Birth: [birth year] Referring Provider (PT): Dr. Tania Ade   Encounter Date: 05/26/2018  PT End of Session - 05/26/18 1503    Visit Number  9    Number of Visits  17    Date for PT Re-Evaluation  06/14/18    PT Start Time  1500    PT Stop Time  1545    PT Time Calculation (min)  45 min    Activity Tolerance  Patient tolerated treatment well    Behavior During Therapy  [facility] for tasks assessed/performed       Past Medical History:  Diagnosis Date  . Diabetes ([place])    type 2 x 14 years  . ED (erectile dysfunction)   . GERD (gastroesophageal reflux disease)   . 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 Hypertension   . Seasonal allergies     Past Surgical History:  Procedure Laterality Date  . ARTHOSCOPIC ROTAOR CUFF REPAIR Left 02/25/2018   Procedure: LEFT ARTHROSCOPIC ROTATOR CUFF REPAIR; SUBACROMIAL DECOMPRESSION;  Surgeon: Tania Ade, MD;  [facility];  Service: Orthopedics;  Laterality: Left;  . CATARACT EXTRACTION W/PHACO Right 09/03/2016   Procedure: CATARACT EXTRACTION PHACO AND INTRAOCULAR LENS PLACEMENT ([place]) RIGHT DIABETIC;  Surgeon: Leandrew Koyanagi, MD;  Location: [place];  Service: Ophthalmology;  Laterality: Right;  DIABETIC  . ROTATOR CUFF REPAIR Right 2009    There were no vitals filed for this visit.  Subjective Assessment - 05/26/18 1502    Subjective  Pt reports that he is doing well at this time. He reports 4/10 L shoulder pain upon arrival. He has been performing HEP without issue. No specific questions or concerns.     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 As soon as he put the boxes down, he felt pain his R low back which has spread across the whole low back, but R>L. Patient is currently not picking up anything >20lbs and is to avoid bending, twisting, lifting.  Pt. was being treated for LBP up until L RTC repair surgery on 02/25/2018.  Pt. is hoping to return to work on 03/22/2018 with limited duty secondary to shoulder limitations/ protocol.       Limitations  Lifting;Standing;Walking;House hold activities;Sitting    Patient Stated Goals  Increase L shoulder ROM/ strengthening to promote return to work.      Currently in Pain?  Yes    Pain Score  4     Pain Location  Shoulder    Pain Orientation  Left    Pain Descriptors / Indicators  Aching    Pain Type  Surgical pain    Pain Onset  More than a month ago    Pain Frequency  Intermittent         TREATMENT   Ther-ex UBE x 4 minutes for warm-up, 2 minutes forward/2 minutes backwards (unbilled); Supine canes for AAROM shoulder flexion and ER x 20 each; Supine chest press with cane and manual resistance from therapist 2 x 10; SupineL shoulderrythmic stabs 30s/bout x 2 bouts; SupineLserratus punch 2 x 10  with manual resistance from therapist; Supine L shoulder flexion with 2# dumbbell (DB) 2 x 10;  Supine L shoulder circles at 90 flexion with 2# DB CW/CCW 2 x 10 each; Supine L bicep curls with green tband x 10; Supine L tricep extension with green tband  x 10; R sidelyingLshoulder scaption with 2# DB x 10; R sidelyingLshoulder ER with 1# DB x 10; R sidelying L shoulder horizontal abduction 1# DB x 10; Prone L shoulder extension with 2# DB x 10; Prone L shoulder midtrap row with 2# DB x 10;   Manual Therapy Supine L shoulder PROM (all planes) per MD protocol, increased pain with PROM ER today so disontinued; Supine L shoulderclavicle on acromionA/P and inferior mobs, grade II-III, 30s/bout x 2 bouts, pt is very  tender; Supine L shoulderanterior to posteriormobilizations at neutral, grade II-III, 30s/bout x 3 bouts; Supine L shoulderposterior and inferiormobilizations at available end range flexion, grade II-III, 30s/bout x 3 bouts; Supine L shoulderanterior to posteriormobilizations at 90 abduction and available end range ER, grade I, 30s, stopped after first bout secondary to increase in pain pain Supine L shoulder inferior mobilizations at 100 scaption, grade II-III, 30s/bout x 3 bouts; Prone L shoulder posterior to anterior mobilizations at 90 abduction, grade II, 30s/bout x 2 bouts;   Pt educated throughout session about proper posture and technique with exercises. Improved exercise technique, movement at target joints, use of target muscles after min to mod verbal, visual, tactile cues.   Pt is making excellent progress with therapy. He is able to perform all exercises as instructed today. He reports some increase in L shoulder pain during PROM ER so discontinued this during session. Pt also reports pain with isometric resisted IR at neutral so likely he has some discomfort in his subscapularis muscle. He is able to progress sidelying and prone strengthening on this date. Pt encouraged to continue HEP.Pt will benefit from PT services to address deficits in strengthand range of motionin order to return to full function at home and work without pain.                     PT Long Term Goals - 05/17/18 1519      PT LONG TERM GOAL #1   Title  Pt. will increase FOTO to 67 to improve L shoulder pain-free moblity with daily tasks.     Baseline  FOTO: initial: 36/ goal: 24; 05/17/18: Not performed    Time  4    Period  Weeks    Status  Deferred    Target Date  06/14/18      PT LONG TERM GOAL #2   Title  Pt. will incresae L shoulder AAROM to [facility] as compared to R shoulder to improve overhead reaching/ functional tasks.      Baseline  Supine L shoulder PROM: flexion 112  deg., abduction 88 deg., ER 0 deg., IR 75 deg; 05/17/18: Supine L shoulder PROM: flexion 155 deg., abduction 150 deg., ER 60 deg., IR 72 deg;     Time  4    Period  Weeks    Status  Partially Met    Target Date  06/14/18      PT LONG TERM GOAL #3   Title  Pt. will increase L shoulder strength to grossly 4/5 MMT (flexion/ abduction/ ER) to improve progression of functional/ work-related tasks.      Baseline  No strength testing at this time; 05/17/18: Deferred strength testing  Time  4    Period  Weeks    Status  Deferred    Target Date  06/14/18      PT LONG TERM GOAL #4   Title  Pt. will report <3/10 L shoulder pain at worst with progression of ROM ex.    Baseline  4-5/10 L shoulder pain at rest/ >9/10 at worst (no pain medications); 05/17/18: worst pain 4-5/10;    Time  4    Period  Weeks    Status  Partially Met    Target Date  06/14/18            Plan - 05/26/18 1503    Clinical Impression Statement  Pt is making excellent progress with therapy. He is able to perform all exercises as instructed today. He reports some increase in L shoulder pain during PROM ER so discontinued this during session. Pt also reports pain with isometric resisted IR at neutral so likely he has some discomfort in his subscapularis muscle. He is able to progress sidelying and prone strengthening on this date. Pt encouraged to continue HEP.Pt will benefit from PT services to address deficits in strengthand range of motionin order to return to full function at home and work without pain.    Stability/Clinical Decision Making  Stable/Uncomplicated    PT Frequency  2x / week    PT Duration  4 weeks    PT Treatment/Interventions  Aquatic Therapy;Cryotherapy;Electrical Stimulation;Iontophoresis 60m/ml Dexamethasone;Moist Heat;Functional mobility training;Stair training;Therapeutic activities;Therapeutic exercise;Balance training;Neuromuscular re-education;Patient/family education;Manual techniques;Dry  needling;Passive range of motion;Taping;Spinal Manipulations;Joint Manipulations    PT Next Visit Plan  L shoulder PROM all planes per MD protocol.  Pt. instructed to contact PT if any issues over staying home/ not attending PT       Patient will benefit from skilled therapeutic intervention in order to improve the following deficits and impairments:  Pain, Improper body mechanics, Decreased mobility, Increased muscle spasms, Postural dysfunction, Difficulty walking, Decreased strength, Decreased range of motion, Decreased activity tolerance, Decreased endurance, Hypomobility  Visit Diagnosis: S/P left rotator cuff repair  Shoulder joint stiffness, left  Muscle weakness (generalized)     Problem List Patient Active Problem List   Diagnosis Date Noted  . ED (erectile dysfunction) of organic origin 02/21/2015  . Diabetes ([place] 10/18/2014  . Hyperlipemia 10/18/2014   JPhillips GroutPT, DPT, GCS  Malikhi Ogan 05/26/2018, 4:48 PM  [place] [facility] [place] [address]Phone: [phone]  Fax:  [phone] Name: GMOSTAFA YUANMRN: [id number] of Birth: [birth year]

## 2018-06-01 ENCOUNTER — Other Ambulatory Visit: Payer: Self-pay

## 2018-06-01 ENCOUNTER — Ambulatory Visit: Payer: No Typology Code available for payment source | Admitting: Physical Therapy

## 2018-06-01 ENCOUNTER — Ambulatory Visit: Payer: PRIVATE HEALTH INSURANCE | Attending: Orthopedic Surgery | Admitting: Physical Therapy

## 2018-06-01 DIAGNOSIS — M25512 Pain in left shoulder: Secondary | ICD-10-CM

## 2018-06-01 DIAGNOSIS — M545 Low back pain, unspecified: Secondary | ICD-10-CM

## 2018-06-01 DIAGNOSIS — M6281 Muscle weakness (generalized): Secondary | ICD-10-CM

## 2018-06-01 DIAGNOSIS — Z9889 Other specified postprocedural states: Secondary | ICD-10-CM | POA: Diagnosis not present

## 2018-06-01 DIAGNOSIS — G8929 Other chronic pain: Secondary | ICD-10-CM | POA: Insufficient documentation

## 2018-06-01 DIAGNOSIS — M533 Sacrococcygeal disorders, not elsewhere classified: Secondary | ICD-10-CM

## 2018-06-01 DIAGNOSIS — R269 Unspecified abnormalities of gait and mobility: Secondary | ICD-10-CM | POA: Diagnosis present

## 2018-06-01 DIAGNOSIS — M25612 Stiffness of left shoulder, not elsewhere classified: Secondary | ICD-10-CM

## 2018-06-02 ENCOUNTER — Encounter: Payer: Self-pay | Admitting: Physical Therapy

## 2018-06-02 NOTE — Therapy (Signed)
[facility] [address]. [place], [place], [address] Phone: [phone]   Fax:  [phone]  Physical Therapy Treatment  Patient Details  Name: Curtis Singh MRN: [id number] Date of Birth: 1955/08/15 Referring Provider (PT): Dr. Tania Ade   Encounter Date: 06/01/2018  PT End of Session - 06/02/18 1343    Visit Number  10    Number of Visits  17    Date for PT Re-Evaluation  06/14/18    PT Start Time  1548    PT Stop Time  1636    PT Time Calculation (min)  48 min    Activity Tolerance  Patient tolerated treatment well;Patient limited by pain    Behavior During Therapy  [facility] for tasks assessed/performed       Past Medical History:  Diagnosis Date  . Diabetes ([place])    type 2 x 14 years  . ED (erectile dysfunction)   . GERD (gastroesophageal reflux disease)   . 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 Hypertension   . Seasonal allergies     Past Surgical History:  Procedure Laterality Date  . ARTHOSCOPIC ROTAOR CUFF REPAIR Left 02/25/2018   Procedure: LEFT ARTHROSCOPIC ROTATOR CUFF REPAIR; SUBACROMIAL DECOMPRESSION;  Surgeon: Tania Ade, MD;  [facility];  Service: Orthopedics;  Laterality: Left;  . CATARACT EXTRACTION W/PHACO Right 09/03/2016   Procedure: CATARACT EXTRACTION PHACO AND INTRAOCULAR LENS PLACEMENT ([place]) RIGHT DIABETIC;  Surgeon: Leandrew Koyanagi, MD;  Location: [place];  Service: Ophthalmology;  Laterality: Right;  DIABETIC  . ROTATOR CUFF REPAIR Right 2009    There were no vitals filed for this visit.  Subjective Assessment - 06/02/18 1342    Subjective  Pt. states L shoulder is getting better and he is ready to start lifting weights again.  Pt. continues to have lifting restrictions at work.      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 As soon as he put the boxes down, he felt pain his R low back which has spread across the whole low back, but R>L. Patient is currently not picking up anything >20lbs and is to avoid bending, twisting, lifting.  Pt. was being treated for LBP up until L RTC repair surgery on 02/25/2018.  Pt. is hoping to return to work on 03/22/2018 with limited duty secondary to shoulder limitations/ protocol.       Limitations  Lifting;Standing;Walking;House hold activities;Sitting    Patient Stated Goals  Increase L shoulder ROM/ strengthening to promote return to work.      Currently in Pain?  No/denies         TREATMENT   Ther-ex Supine L shoulder AAROM all planes (reassessment of L shoulder joint mobility).  Pain limited with ER.  SupineL shoulderrythmic stabs 30s/bout x 2 bouts; SupineLserratus punch 2 x 10 with manual resistance from therapist; Reviewed HEP Nautilus: 40# lat. Pull downs/ 30# tricep extension/ 30# sh. Adduction with handles/ 40# chest press with wand/ 40# scap. Retraction 20x each.  Standing L/R diagonals 30# 20x.  3# bicep curls 30x/ punches 15x.    Pt educated throughout session about proper posture and technique with exercises. Improved exercise technique, movement at target joints, use of target muscles after min to mod verbal, visual, tactile cues.  PT Long Term Goals - 05/17/18 1519      PT LONG TERM GOAL #1   Title  Pt. will increase FOTO to 67 to improve L shoulder pain-free moblity with daily tasks.     Baseline  FOTO: initial: 36/ goal: 8; 05/17/18: Not performed    Time  4    Period  Weeks    Status  Deferred    Target Date  06/14/18      PT LONG TERM GOAL #2   Title  Pt. will incresae L shoulder AAROM to [facility] as compared to R shoulder to improve overhead reaching/ functional tasks.      Baseline  Supine L shoulder PROM: flexion 112 deg., abduction 88 deg., ER 0 deg., IR 75 deg; 05/17/18: Supine L  shoulder PROM: flexion 155 deg., abduction 150 deg., ER 60 deg., IR 72 deg;     Time  4    Period  Weeks    Status  Partially Met    Target Date  06/14/18      PT LONG TERM GOAL #3   Title  Pt. will increase L shoulder strength to grossly 4/5 MMT (flexion/ abduction/ ER) to improve progression of functional/ work-related tasks.      Baseline  No strength testing at this time; 05/17/18: Deferred strength testing     Time  4    Period  Weeks    Status  Deferred    Target Date  06/14/18      PT LONG TERM GOAL #4   Title  Pt. will report <3/10 L shoulder pain at worst with progression of ROM ex.    Baseline  4-5/10 L shoulder pain at rest/ >9/10 at worst (no pain medications); 05/17/18: worst pain 4-5/10;    Time  4    Period  Weeks    Status  Partially Met    Target Date  06/14/18            Plan - 06/02/18 1346    Clinical Impression Statement  Pt. continues to make consistent progress with L shoulder AROM all planes of movement, except ER.  L shoulder AROM WFL except ER pain limted at 32 deg. and AAROM 70 deg.  Good L shoulder joint capsule mobility and incision healing.  Pt. has started to progress with resisted ex. program/ Nautilus with no issues and proper technique.  Pt. will continue to benefit from skilled PT to progress strength training/ maximize functional mobility.      Stability/Clinical Decision Making  Stable/Uncomplicated    Clinical Decision Making  Low    Rehab Potential  Excellent    PT Frequency  2x / week    PT Duration  4 weeks    PT Treatment/Interventions  Aquatic Therapy;Cryotherapy;Electrical Stimulation;Iontophoresis 46m/ml Dexamethasone;Moist Heat;Functional mobility training;Stair training;Therapeutic activities;Therapeutic exercise;Balance training;Neuromuscular re-education;Patient/family education;Manual techniques;Dry needling;Passive range of motion;Taping;Spinal Manipulations;Joint Manipulations    PT Next Visit Plan  Progress strength training/  discuss MD appt.        Patient will benefit from skilled therapeutic intervention in order to improve the following deficits and impairments:  Pain, Improper body mechanics, Decreased mobility, Increased muscle spasms, Postural dysfunction, Difficulty walking, Decreased strength, Decreased range of motion, Decreased activity tolerance, Decreased endurance, Hypomobility  Visit Diagnosis: S/P left rotator cuff repair  Shoulder joint stiffness, left  Muscle weakness (generalized)  Acute pain of left shoulder     Problem List Patient Active Problem List   Diagnosis Date Noted  . ED (erectile  dysfunction) of organic origin 02/21/2015  . Diabetes ([place]) 10/18/2014  . Hyperlipemia 10/18/2014   Pura Spice, PT, DPT # [id number] 06/02/2018, 2:01 PM  [facility] [address] [place], [place], [address] Phone: [phone]   Fax:  [phone]  Name: JAIMERE FEUTZ MRN: [id number] Date of Birth: September 22, 1955

## 2018-06-03 ENCOUNTER — Ambulatory Visit: Payer: PRIVATE HEALTH INSURANCE | Admitting: Physical Therapy

## 2018-06-03 ENCOUNTER — Encounter: Payer: No Typology Code available for payment source | Admitting: Physical Therapy

## 2018-06-06 NOTE — Therapy (Addendum)
[facility] [address] [place], [place], [address] Phone: [phone]   Fax:  [phone]  Physical Therapy Evaluation  Patient Details  Name: Curtis Singh MRN: [id number] Date of Birth: Aug 21, 1955 Referring Provider (PT): Dr. Lynann Bologna   Encounter Date: 06/01/2018  PT End of Session - 06/08/18 7425    Visit Number  1    Number of Visits  8    Date for PT Re-Evaluation  06/29/18    PT Start Time  1501    PT Stop Time  9563    PT Time Calculation (min)  47 min    Activity Tolerance  Patient tolerated treatment well    Behavior During Therapy  [facility] for tasks assessed/performed       Past Medical History:  Diagnosis Date  . Diabetes ([place])    type 2 x 14 years  . ED (erectile dysfunction)   . GERD (gastroesophageal reflux disease)   . 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 Hypertension   . Seasonal allergies     Past Surgical History:  Procedure Laterality Date  . ARTHOSCOPIC ROTAOR CUFF REPAIR Left 02/25/2018   Procedure: LEFT ARTHROSCOPIC ROTATOR CUFF REPAIR; SUBACROMIAL DECOMPRESSION;  Surgeon: Tania Ade, MD;  [facility];  Service: Orthopedics;  Laterality: Left;  . CATARACT EXTRACTION W/PHACO Right 09/03/2016   Procedure: CATARACT EXTRACTION PHACO AND INTRAOCULAR LENS PLACEMENT ([place]) RIGHT DIABETIC;  Surgeon: Leandrew Koyanagi, MD;  Location: [place];  Service: Ophthalmology;  Laterality: Right;  DIABETIC  . ROTATOR CUFF REPAIR Right 2009    There were no vitals filed for this visit.   Subjective Assessment - 06/08/18 0714    Subjective  Pt. referred back to PT for chronic low back/ SI pain.  Pt. was being treated for low back pain prior to L RTC repair (see recent PT notes).  Pt. states he received a home TENS unit since having MRI and reports improvement in pain.  Pt. reports minimal to no pain currently at rest while PT discussing pts. return to work.  Lifting restrictions still  in place <10#.    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 As soon as he put the boxes down, he felt pain his R low back which has spread across the whole low back, but R>L. Patient is currently not picking up anything >20lbs and is to avoid bending, twisting, lifting.  Pt. was being treated for LBP up until L RTC repair surgery on 02/25/2018.  Pt. is hoping to return to work on 03/22/2018 with limited duty secondary to shoulder limitations/ protocol.       Limitations  Lifting;Standing;Walking;House hold activities;Sitting    Diagnostic tests  (-) MRI    Patient Stated Goals  Pain-free movement with work-related tasks/ riding motorcycle    Currently in Pain?  No/denies            Pain:0/10 Present,0/10 Best,5/10 Worst 24 hour pain behavior: morning; in reaction to movements throughout the day How long can you sit: 1 hour How long can you stand: 1 hour How long can you walk: unlimited Recent back trauma: 12/2017 Prior history of back injury or pain: Yes Radiating pain: localized in R SI Numbness/Tingling: No Follow-up appointment with MD: Yes Dominant hand: right  Imaging: MRI (-)  OBJECTIVE  Mental Status Patient is oriented to person, place and time.  Recent memory is intact.  Remote memory is intact.  Attention span and concentration are intact.  Expressive speech is intact.  Patient's fund of knowledge is within normal limits for educational level.  SENSATION: Grossly intact to light touch bilateralas determined by testing dermatomes L2-S2 Proprioception and hot/cold testing deferred on this date  MUSCULOSKELETAL: Tremor: None Bulk: Normal Tone: Normal  Posture Decreased lumbar curve in standing with rounded shoulders.  Gait Mild L lateral lean with decreased stance time on  RLE. Decreased pelvic movement.  Palpation R SI tenderness to palpation greatest near the iliac crest.  Strength (out of 5) Grossly 5/5 MMT (no increase in pain)  AROM (degrees) R/L (all movements include overpressure unless otherwise stated) Lumbar forward flexion (0-65): WFL * pain when extending from flexed position Lumbar extension (0-30): WFL Lumbar lateral flexion (0-25): R:WFL *  L: WFL  Lumbar rotation: WFL, calms pain Hip IR (0-45): B: WFL Hip ER (0-45): B: WFL Hip Flexion (0-125): WFL Hip Abduction (0-40): B: WFL Hip extension (0-15): B: WFL *Indicates pain  PROM (degrees) PROM = AROM *Indicates pain  Repeated Movements No change in symptoms  Muscle Length Hamstrings (popliteal angle): R: 180 degrees L: 180 degrees   Passive Accessory Intervertebral Motion (PAIVM) Good spinal mobility with no significant limitations noted with CPA L1-L5 and UPA bilaterally L1-L5.     SPECIAL TESTS Lumbar quadrant: R: Negative L: Negative Slump: R: Negative L: Negative SLR: R: Negative L:  Negative Crossed SLR: R: Negative L: Negative FABER: R: Negative L: Negative FADIR: R: Negative L: Negative          [place]: R: Positive L: Negative  Objective measurements completed on examination: See above findings.   TREATMENT  Reviewed core ex. Program/ body mechanics.   PT Short Term Goals - 06/08/18 0734      PT SHORT TERM GOAL #1   Title  Pt. will increase FOTO to 65 to improve pain-free mobility with lifting/ work-related tasks.    Baseline  initial FOTO on 06/01/2018:  59    Time  4    Period  Weeks    Status  New    Target Date  06/29/18      PT SHORT TERM GOAL #2   Title  Pt. able to demonstrate proper lifting technique of 40# box with no c/o R low back pain to improve work-related tasks.     Baseline  lifting restriction/ lifting upright posture with floor to waist lifting (pt. also restricted by L shoulder)    Time  4    Period  Weeks    Status  New     Target Date  06/29/18      PT SHORT TERM GOAL #3   Title  Pt. will report no R SI/lumbar tenderness with palpation to improve pain-free mobility.      Baseline  (+) R SI tenderness.  Pt. reports no pain at rest in sitting/standing posture.     Time  4    Period  Weeks    Status  New    Target Date  06/29/18      PT SHORT TERM GOAL #4   Title  Pt. able to ride motorcycle with no c/o R low back pain.      Baseline  Pt. planning on riding to [place] but concerned about low back pain.    Time  4  Period  Weeks    Status  New    Target Date  06/29/18           Plan - 06/08/18 0724    Clinical Impression Statement  Pt. is a pleasant 63 year old male referred back to clinic with chief complaint of R sided low back pain which limits his function at work and home.  Pt. has been treated for low back prior to clinical closures due to Covid and L RTC repair.  Pt. is continuing to receive PT for L shoulder strengthening to promote return to work without lifting restrictions.  Pt. presents with marked increase in lumbar AROM with minimal c/o R SI discomfort.  No change in symptoms with repeated movement.  R SI tenderness remains present.  Good pelvic symmetry.  (+) R SI tenderness noted with no symptoms in lumbar paraspinals.  No radicular symptoms.  FOTO: 59/ goal of 65.  Pt. will benefit from skilled therapeutic intervention to address body mechanics for safe lifting practices/ core strengthening to prevent the recurrence of injury on the job.     Stability/Clinical Decision Making  Stable/Uncomplicated    Clinical Decision Making  Low    Rehab Potential  Excellent    PT Frequency  2x / week    PT Duration  4 weeks    PT Treatment/Interventions  Aquatic Therapy;Cryotherapy;Electrical Stimulation;Iontophoresis 4mg /ml Dexamethasone;Moist Heat;Functional mobility training;Stair training;Therapeutic activities;Therapeutic exercise;Balance training;Neuromuscular re-education;Patient/family  education;Manual techniques;Dry needling;Passive range of motion;Taping;Spinal Manipulations;Joint Manipulations    PT Next Visit Plan  Check parameters on pts. personal TENS/ issue core stability/ hamstring stretches.   Pt. has 2 certification dates/ referring MDs.         Patient will benefit from skilled therapeutic intervention in order to improve the following deficits and impairments:  Pain, Improper body mechanics, Decreased mobility, Increased muscle spasms, Postural dysfunction, Difficulty walking, Decreased strength, Decreased range of motion, Decreased activity tolerance, Decreased endurance, Hypomobility  Visit Diagnosis: Chronic right-sided low back pain without sciatica  Sacroiliac joint dysfunction of right side  Gait difficulty  Muscle weakness (generalized)     Problem List Patient Active Problem List   Diagnosis Date Noted  . ED (erectile dysfunction) of organic origin 02/21/2015  . Diabetes ([place]) 10/18/2014  . Hyperlipemia 10/18/2014   Pura Spice, PT, DPT # [id number] 06/08/2018, 7:46 AM  [facility] [address] [place], [place], [address] Phone: [phone]   Fax:  [phone]  Name: Curtis Singh MRN: [id number] Date of Birth: 1955/11/08

## 2018-06-08 ENCOUNTER — Ambulatory Visit: Payer: No Typology Code available for payment source | Attending: Orthopedic Surgery

## 2018-06-08 ENCOUNTER — Encounter: Payer: Self-pay | Admitting: Physical Therapy

## 2018-06-08 ENCOUNTER — Ambulatory Visit: Payer: PRIVATE HEALTH INSURANCE | Attending: Orthopedic Surgery

## 2018-06-08 ENCOUNTER — Other Ambulatory Visit: Payer: Self-pay

## 2018-06-08 DIAGNOSIS — M25512 Pain in left shoulder: Secondary | ICD-10-CM | POA: Diagnosis present

## 2018-06-08 DIAGNOSIS — R269 Unspecified abnormalities of gait and mobility: Secondary | ICD-10-CM | POA: Insufficient documentation

## 2018-06-08 DIAGNOSIS — R293 Abnormal posture: Secondary | ICD-10-CM | POA: Diagnosis present

## 2018-06-08 DIAGNOSIS — M545 Low back pain, unspecified: Secondary | ICD-10-CM

## 2018-06-08 DIAGNOSIS — M6281 Muscle weakness (generalized): Secondary | ICD-10-CM | POA: Insufficient documentation

## 2018-06-08 DIAGNOSIS — Z9889 Other specified postprocedural states: Secondary | ICD-10-CM | POA: Diagnosis present

## 2018-06-08 DIAGNOSIS — M533 Sacrococcygeal disorders, not elsewhere classified: Secondary | ICD-10-CM | POA: Diagnosis present

## 2018-06-08 DIAGNOSIS — G8929 Other chronic pain: Secondary | ICD-10-CM | POA: Diagnosis present

## 2018-06-08 DIAGNOSIS — M25612 Stiffness of left shoulder, not elsewhere classified: Secondary | ICD-10-CM | POA: Insufficient documentation

## 2018-06-08 NOTE — Therapy (Signed)
[facility] [address]. [place], [place], [address] Phone: [phone]   Fax:  [phone]  Physical Therapy Treatment  Patient Details  Name: Curtis Singh MRN: [id number] Date of Birth: [birth year] Referring Provider (PT): Dr. Lynann Bologna   Encounter Date: 06/08/2018  PT End of Session - 06/08/18 1721    Visit Number  11    Number of Visits  17    Date for PT Re-Evaluation  06/14/18    PT Start Time  8841    PT Stop Time  1635    PT Time Calculation (min)  38 min    Activity Tolerance  Patient tolerated treatment well    Behavior During Therapy  [facility] for tasks assessed/performed       Past Medical History:  Diagnosis Date  . Diabetes ([place])    type 2 x 14 years  . ED (erectile dysfunction)   . GERD (gastroesophageal reflux disease)   . 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 Hypertension   . Seasonal allergies     Past Surgical History:  Procedure Laterality Date  . ARTHOSCOPIC ROTAOR CUFF REPAIR Left 02/25/2018   Procedure: LEFT ARTHROSCOPIC ROTATOR CUFF REPAIR; SUBACROMIAL DECOMPRESSION;  Surgeon: Tania Ade, MD;  [facility];  Service: Orthopedics;  Laterality: Left;  . CATARACT EXTRACTION W/PHACO Right 09/03/2016   Procedure: CATARACT EXTRACTION PHACO AND INTRAOCULAR LENS PLACEMENT ([place]) RIGHT DIABETIC;  Surgeon: Leandrew Koyanagi, MD;  Location: [place];  Service: Ophthalmology;  Laterality: Right;  DIABETIC  . ROTATOR CUFF REPAIR Right 2009    There were no vitals filed for this visit.  Subjective Assessment - 06/08/18 1556    Subjective  Pt reported that his shoulder is doing well, has limitations in ER in 90/90 position. 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 As soon as  he put the boxes down, he felt pain his R low back which has spread across the whole low back, but R>L. Patient is currently not picking up anything >20lbs and is to avoid bending, twisting, lifting.  Pt. was being treated for LBP up until L RTC repair surgery on 02/25/2018.  Pt. is hoping to return to work on 03/22/2018 with limited duty secondary to shoulder limitations/ protocol.       Limitations  Lifting;Standing;Walking;House hold activities;Sitting    Diagnostic tests  (-) MRI    Patient Stated Goals  Pain-free movement with work-related tasks/ riding motorcycle    Currently in Pain?  No/denies       TREATMENT: Standing ER AAROM at 45deg 5x20sec Supine L shoulder rythmic stabs 30s/bout x 3 bouts with RTB Supine L serratus punch 5#x15  6#x12 Nautilus: 40# lat. Pull downs/ 30# tricep extension/ 30# shoulder extension and flexion with handles x20 ea 40#40# chest press with wand/ 40# scap. Retraction 20x  3# bicep curls 30x/ punches 20x.  PROM ER 5x20sec holds to end range with avoidance of pain.  Pt educated throughout session about proper posture and technique with exercises. Improved exercise technique, movement at target joints, use of target muscles after min to mod verbal, visual, tactile cues.       PT Education - 06/08/18 1718    Education Details  Therex technique/form  Person(s) Educated  Patient    Methods  Explanation;Demonstration    Comprehension  Verbalized understanding       PT Short Term Goals - 06/08/18 0734      PT SHORT TERM GOAL #1   Title  Pt. will increase FOTO to 65 to improve pain-free mobility with lifting/ work-related tasks.    Baseline  initial FOTO on 06/01/2018:  59    Time  4    Period  Weeks    Status  New    Target Date  06/29/18      PT SHORT TERM GOAL #2   Title  Pt. able to demonstrate proper lifting technique of 40# box with no c/o R low back pain to improve work-related tasks.     Baseline  lifting restriction/ lifting upright  posture with floor to waist lifting (pt. also restricted by L shoulder)    Time  4    Period  Weeks    Status  New    Target Date  06/29/18      PT SHORT TERM GOAL #3   Title  Pt. will report no R SI/lumbar tenderness with palpation to improve pain-free mobility.      Baseline  (+) R SI tenderness.  Pt. reports no pain at rest in sitting/standing posture.     Time  4    Period  Weeks    Status  New    Target Date  06/29/18      PT SHORT TERM GOAL #4   Title  Pt. able to ride motorcycle with no c/o R low back pain.      Baseline  Pt. planning on riding to [place] but concerned about low back pain.    Time  4    Period  Weeks    Status  New    Target Date  06/29/18        PT Long Term Goals - 05/17/18 1519      PT LONG TERM GOAL #1   Title  Pt. will increase FOTO to 67 to improve L shoulder pain-free moblity with daily tasks.     Baseline  FOTO: initial: 36/ goal: 39; 05/17/18: Not performed    Time  4    Period  Weeks    Status  Deferred    Target Date  06/14/18      PT LONG TERM GOAL #2   Title  Pt. will incresae L shoulder AAROM to [facility] as compared to R shoulder to improve overhead reaching/ functional tasks.      Baseline  Supine L shoulder PROM: flexion 112 deg., abduction 88 deg., ER 0 deg., IR 75 deg; 05/17/18: Supine L shoulder PROM: flexion 155 deg., abduction 150 deg., ER 60 deg., IR 72 deg;     Time  4    Period  Weeks    Status  Partially Met    Target Date  06/14/18      PT LONG TERM GOAL #3   Title  Pt. will increase L shoulder strength to grossly 4/5 MMT (flexion/ abduction/ ER) to improve progression of functional/ work-related tasks.      Baseline  No strength testing at this time; 05/17/18: Deferred strength testing     Time  4    Period  Weeks    Status  Deferred    Target Date  06/14/18      PT LONG TERM GOAL #4   Title  Pt. will report <3/10 L shoulder  pain at worst with progression of ROM ex.    Baseline  4-5/10 L shoulder pain at rest/ >9/10 at worst  (no pain medications); 05/17/18: worst pain 4-5/10;    Time  4    Period  Weeks    Status  Partially Met    Target Date  06/14/18            Plan - 06/08/18 1719    Clinical Impression Statement  Pt continues to make progress with L shoulder strengthening. Still challenged by ER of L shoulder and demonstrated deficits at end range. Pt instructed in HEP to increase ER as able. Pt with mild fatigue in L shoulder end of session but did not report any increased pain. the patient would benefit from further skilled PT intevention to continue to progress pt to return to PLOF.    Stability/Clinical Decision Making  Stable/Uncomplicated    Rehab Potential  Excellent    PT Frequency  2x / week    PT Duration  4 weeks    PT Treatment/Interventions  Aquatic Therapy;Cryotherapy;Electrical Stimulation;Iontophoresis 60m/ml Dexamethasone;Moist Heat;Functional mobility training;Stair training;Therapeutic activities;Therapeutic exercise;Balance training;Neuromuscular re-education;Patient/family education;Manual techniques;Dry needling;Passive range of motion;Taping;Spinal Manipulations;Joint Manipulations    Consulted and Agree with Plan of Care  Patient       Patient will benefit from skilled therapeutic intervention in order to improve the following deficits and impairments:  Pain, Improper body mechanics, Decreased mobility, Increased muscle spasms, Postural dysfunction, Difficulty walking, Decreased strength, Decreased range of motion, Decreased activity tolerance, Decreased endurance, Hypomobility  Visit Diagnosis: S/P left rotator cuff repair  Shoulder joint stiffness, left  Muscle weakness (generalized)  Acute pain of left shoulder     Problem List Patient Active Problem List   Diagnosis Date Noted  . ED (erectile dysfunction) of organic origin 02/21/2015  . Diabetes ([place] 10/18/2014  . Hyperlipemia 10/18/2014    DLieutenant DiegoPT, DPT 5:23 PM,06/08/18 [phone] [facility] [address]Phone: [phone]  Fax:  [phone] Name: Curtis MCCARRYMRN: [id number] of Birth: 1Mar 08, 1957

## 2018-06-08 NOTE — Therapy (Signed)
[facility] [address] [place], [place], [address] Phone: [phone]   Fax:  [phone]  Physical Therapy Treatment  Patient Details  Name: Curtis Singh MRN: [id number] Date of Birth: 02/12/1955 Referring Provider (PT): Dr. Lynann Bologna   Encounter Date: 06/08/2018  PT End of Session - 06/08/18 1728    Visit Number  2    Number of Visits  8    Date for PT Re-Evaluation  06/29/18    PT Start Time  1633    PT Stop Time  1712    PT Time Calculation (min)  39 min    Activity Tolerance  Patient tolerated treatment well    Behavior During Therapy  [facility] for tasks assessed/performed       Past Medical History:  Diagnosis Date  . Diabetes ([place])    type 2 x 14 years  . ED (erectile dysfunction)   . GERD (gastroesophageal reflux disease)   . 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 Hypertension   . Seasonal allergies     Past Surgical History:  Procedure Laterality Date  . ARTHOSCOPIC ROTAOR CUFF REPAIR Left 02/25/2018   Procedure: LEFT ARTHROSCOPIC ROTATOR CUFF REPAIR; SUBACROMIAL DECOMPRESSION;  Surgeon: Tania Ade, MD;  Location: [facility];  Service: Orthopedics;  Laterality: Left;  . CATARACT EXTRACTION W/PHACO Right 09/03/2016   Procedure: CATARACT EXTRACTION PHACO AND INTRAOCULAR LENS PLACEMENT ([place]) RIGHT DIABETIC;  Surgeon: Leandrew Koyanagi, MD;  Location: [place];  Service: Ophthalmology;  Laterality: Right;  DIABETIC  . ROTATOR CUFF REPAIR Right 2009    There were no vitals filed for this visit.  Subjective Assessment - 06/08/18 1639    Subjective  Pt reported that he has had to use his tens unit today twice. Reported he was able to work today. 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 He was moving boxes while working and he picked 2 boxes up from roughly waist/chest height  and rotated L with the boxes to set them down. As soon as he put the boxes down, he felt pain his R low back which has spread across the whole low back, but R>L. Patient is currently not picking up anything >20lbs and is to avoid bending, twisting, lifting.  Pt. was being treated for LBP up until L RTC repair surgery on 02/25/2018.  Pt. is hoping to return to work on 03/22/2018 with limited duty secondary to shoulder limitations/ protocol.       Limitations  Lifting;Standing;Walking;House hold activities;Sitting    Diagnostic tests  (-) MRI    Patient Stated Goals  Pain-free movement with work-related tasks/ riding motorcycle    Pain Score  3     Pain Location  Back    Pain Orientation  Right    Pain Descriptors / Indicators  Aching    Pain Type  Chronic pain    Pain Onset  More than a month ago      TREATMENT:  Manual therapy:  PSIS gapping on L, relieved pain on R STM to superior glute muscles lumbar parapsinals  Therapeutic exercise: Pelvic tils; anteriorly and posteriorly. Pt often anteriorly tilted when asked to posterior tilt. Education provided in supine, sitting on therapy ball, and sitting on table, with fair carryover.  Transverse abdominis activation in supine 20x3sec holds. Verbal/visual cues needed for  proper activation, as well as breathing technique. Pt unable to coordination posterior pelvic tilt and TrA activation simultaneously     PT Education - 06/08/18 1725    Education Details  therex form/technique, HEP    Person(s) Educated  Patient    Methods  Explanation;Demonstration;Verbal cues;Tactile cues    Comprehension  Verbalized understanding;Need further instruction       PT Short Term Goals - 06/08/18 0734      PT SHORT TERM GOAL #1   Title  Pt. will increase FOTO to 65 to improve pain-free mobility with lifting/ work-related tasks.    Baseline  initial FOTO on 06/01/2018:  59    Time  4    Period  Weeks    Status  New    Target Date  06/29/18      PT SHORT  TERM GOAL #2   Title  Pt. able to demonstrate proper lifting technique of 40# box with no c/o R low back pain to improve work-related tasks.     Baseline  lifting restriction/ lifting upright posture with floor to waist lifting (pt. also restricted by L shoulder)    Time  4    Period  Weeks    Status  New    Target Date  06/29/18      PT SHORT TERM GOAL #3   Title  Pt. will report no R SI/lumbar tenderness with palpation to improve pain-free mobility.      Baseline  (+) R SI tenderness.  Pt. reports no pain at rest in sitting/standing posture.     Time  4    Period  Weeks    Status  New    Target Date  06/29/18      PT SHORT TERM GOAL #4   Title  Pt. able to ride motorcycle with no c/o R low back pain.      Baseline  Pt. planning on riding to [place] but concerned about low back pain.    Time  4    Period  Weeks    Status  New    Target Date  06/29/18        PT Long Term Goals - 05/17/18 1519      PT LONG TERM GOAL #1   Title  Pt. will increase FOTO to 67 to improve L shoulder pain-free moblity with daily tasks.     Baseline  FOTO: initial: 36/ goal: 24; 05/17/18: Not performed    Time  4    Period  Weeks    Status  Deferred    Target Date  06/14/18      PT LONG TERM GOAL #2   Title  Pt. will incresae L shoulder AAROM to [facility] as compared to R shoulder to improve overhead reaching/ functional tasks.      Baseline  Supine L shoulder PROM: flexion 112 deg., abduction 88 deg., ER 0 deg., IR 75 deg; 05/17/18: Supine L shoulder PROM: flexion 155 deg., abduction 150 deg., ER 60 deg., IR 72 deg;     Time  4    Period  Weeks    Status  Partially Met    Target Date  06/14/18      PT LONG TERM GOAL #3   Title  Pt. will increase L shoulder strength to grossly 4/5 MMT (flexion/ abduction/ ER) to improve progression of functional/ work-related tasks.      Baseline  No strength testing at this time; 05/17/18: Deferred strength testing  Time  4    Period  Weeks    Status  Deferred     Target Date  06/14/18      PT LONG TERM GOAL #4   Title  Pt. will report <3/10 L shoulder pain at worst with progression of ROM ex.    Baseline  4-5/10 L shoulder pain at rest/ >9/10 at worst (no pain medications); 05/17/18: worst pain 4-5/10;    Time  4    Period  Weeks    Status  Partially Met    Target Date  06/14/18            Plan - 06/08/18 1726    Clinical Impression Statement  Pt most challenged by anterior and posterior tilting of the pelvis, as well as maintaining breathing Pt provided with extensive verbal and visual cues. Pt also educated on core activation this session, especially transverse abdominis activation. Pt would benefit from further instruction. Of note, pt reported decreased R sided PSIS/SI pain with L side SI gapping that carried over into ambulation. Overall the patient would benefit from skilled PT intervention for continued education, core strengthening and instruction in body mechanics/lifting technique as able.     Stability/Clinical Decision Making  Stable/Uncomplicated    Rehab Potential  Excellent    PT Frequency  2x / week    PT Duration  4 weeks    PT Treatment/Interventions  Aquatic Therapy;Cryotherapy;Electrical Stimulation;Iontophoresis 1m/ml Dexamethasone;Moist Heat;Functional mobility training;Stair training;Therapeutic activities;Therapeutic exercise;Balance training;Neuromuscular re-education;Patient/family education;Manual techniques;Dry needling;Passive range of motion;Taping;Spinal Manipulations;Joint Manipulations    PT Next Visit Plan  Check parameters on pts. personal TENS/ issue core stability/ hamstring stretches.   Pt. has 2 certification dates/ referring MDs.      PT Home Exercise Plan  Access Code: [email or web address](HLTR, supine piriformis stretch, single knee to chest). Page 1 and 2 transverse abdominus core progression.     Consulted and Agree with Plan of Care  Patient       Patient will benefit from skilled therapeutic intervention in  order to improve the following deficits and impairments:  Pain, Improper body mechanics, Decreased mobility, Increased muscle spasms, Postural dysfunction, Difficulty walking, Decreased strength, Decreased range of motion, Decreased activity tolerance, Decreased endurance, Hypomobility  Visit Diagnosis: Sacroiliac joint dysfunction of right side  Chronic right-sided low back pain without sciatica  Gait difficulty  Acute low back pain, unspecified back pain laterality, unspecified whether sciatica present  Abnormal posture     Problem List Patient Active Problem List   Diagnosis Date Noted  . ED (erectile dysfunction) of organic origin 02/21/2015  . Diabetes ([place] 10/18/2014  . Hyperlipemia 10/18/2014    DLieutenant DiegoPT, DPT 5:31 PM,06/08/18 [phone] [facility] CenterM[facility] LP[address]MSpring Hope [place] [address]Phone: [phone]  Fax:  [phone] Name: Curtis MAIELLOMRN: [id number] of Birth: [birth year]

## 2018-06-08 NOTE — Addendum Note (Signed)
Addended by: Pura Spice on: 06/08/2018 07:49 AM   Modules accepted: Orders

## 2018-06-10 ENCOUNTER — Encounter: Payer: Self-pay | Admitting: Physical Therapy

## 2018-06-10 ENCOUNTER — Ambulatory Visit: Payer: PRIVATE HEALTH INSURANCE | Admitting: Physical Therapy

## 2018-06-10 ENCOUNTER — Other Ambulatory Visit: Payer: Self-pay

## 2018-06-10 ENCOUNTER — Ambulatory Visit: Payer: No Typology Code available for payment source | Admitting: Physical Therapy

## 2018-06-10 DIAGNOSIS — M25612 Stiffness of left shoulder, not elsewhere classified: Secondary | ICD-10-CM

## 2018-06-10 DIAGNOSIS — R293 Abnormal posture: Secondary | ICD-10-CM

## 2018-06-10 DIAGNOSIS — Z9889 Other specified postprocedural states: Secondary | ICD-10-CM | POA: Diagnosis not present

## 2018-06-10 DIAGNOSIS — M545 Low back pain, unspecified: Secondary | ICD-10-CM

## 2018-06-10 DIAGNOSIS — M6281 Muscle weakness (generalized): Secondary | ICD-10-CM

## 2018-06-10 DIAGNOSIS — M25512 Pain in left shoulder: Secondary | ICD-10-CM

## 2018-06-10 DIAGNOSIS — G8929 Other chronic pain: Secondary | ICD-10-CM

## 2018-06-10 DIAGNOSIS — M533 Sacrococcygeal disorders, not elsewhere classified: Secondary | ICD-10-CM | POA: Diagnosis not present

## 2018-06-10 DIAGNOSIS — R269 Unspecified abnormalities of gait and mobility: Secondary | ICD-10-CM

## 2018-06-10 NOTE — Therapy (Signed)
[facility] [address] [place], [place], [address] Phone: [phone]   Fax:  [phone]  Physical Therapy Treatment  Patient Details  Name: Curtis Singh MRN: [id number] Date of Birth: 08-Jan-1955 Referring Provider (PT): Dr. Lynann Bologna   Encounter Date: 06/10/2018  PT End of Session - 06/10/18 1635    Visit Number  12    Number of Visits  17    Date for PT Re-Evaluation  06/14/18    PT Start Time  0102    PT Stop Time  1620    PT Time Calculation (min)  35 min    Activity Tolerance  Patient tolerated treatment well    Behavior During Therapy  [facility] for tasks assessed/performed       Past Medical History:  Diagnosis Date  . Diabetes ([place])    type 2 x 14 years  . ED (erectile dysfunction)   . GERD (gastroesophageal reflux disease)   . 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 Hypertension   . Seasonal allergies     Past Surgical History:  Procedure Laterality Date  . ARTHOSCOPIC ROTAOR CUFF REPAIR Left 02/25/2018   Procedure: LEFT ARTHROSCOPIC ROTATOR CUFF REPAIR; SUBACROMIAL DECOMPRESSION;  Surgeon: Tania Ade, MD;  [facility];  Service: Orthopedics;  Laterality: Left;  . CATARACT EXTRACTION W/PHACO Right 09/03/2016   Procedure: CATARACT EXTRACTION PHACO AND INTRAOCULAR LENS PLACEMENT ([place]) RIGHT DIABETIC;  Surgeon: Leandrew Koyanagi, MD;  Location: [place];  Service: Ophthalmology;  Laterality: Right;  DIABETIC  . ROTATOR CUFF REPAIR Right 2009    There were no vitals filed for this visit.  Subjective Assessment - 06/10/18 1545    Subjective  Patient states that he is continuing to be mindful of his L shoulder when at work, but denies any pain.     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 As soon as he put  the boxes down, he felt pain his R low back which has spread across the whole low back, but R>L. Patient is currently not picking up anything >20lbs and is to avoid bending, twisting, lifting.  Pt. was being treated for LBP up until L RTC repair surgery on 02/25/2018.  Pt. is hoping to return to work on 03/22/2018 with limited duty secondary to shoulder limitations/ protocol.       Limitations  Lifting;Standing;Walking;House hold activities;Sitting    Diagnostic tests  (-) MRI    Patient Stated Goals  Pain-free movement with work-related tasks/ riding motorcycle    Currently in Pain?  No/denies    Pain Score  0-No pain    Pain Onset  More than a month ago       TREATMENT  Therapeutic Exercise: Standing ER AAROM at 45deg 8x20sec Supine L shoulder rhythmic stabs 30s/bout x 3 bouts with RTB Supine L serratus punch 5#x15  6#x12 Nautilus: 40# lat. Pull downs/ 50# triceps extension/ 40# shoulder extension and flexion with handles x20 ea   50# chest press with wand   40# bicep curls 30x/ punches 20x.    Patient educated throughout session on appropriate technique and form using multi-modal cueing, HEP, and activity modification. Patient articulated understanding and returned demonstration.  Patient Response to interventions: Patient denied any increase in pain from the exercises.  ASSESSMENT Patient presents to clinic with excellent motivation to participate in therapy. Patient demonstrates deficits in L shoulder ER ROM, L shoulder strength, and L shoulder function as evidenced by continued difficulty with AROM external rotation above 60 degrees of abduction and commensurate strength deficits with s/p L rotator cuff repair. Patient able to achieve appropriate form performing shoulder strengthening with increased load during today's session and responded positively to active interventions. Patient will benefit from continued skilled therapeutic intervention to address remaining deficits in L  shoulder ROM, strength, and function in order to return to full participation in the community and at work, increase function, and improve overall QOL.      PT Short Term Goals - 06/08/18 0734      PT SHORT TERM GOAL #1   Title  Pt. will increase FOTO to 65 to improve pain-free mobility with lifting/ work-related tasks.    Baseline  initial FOTO on 06/01/2018:  59    Time  4    Period  Weeks    Status  New    Target Date  06/29/18      PT SHORT TERM GOAL #2   Title  Pt. able to demonstrate proper lifting technique of 40# box with no c/o R low back pain to improve work-related tasks.     Baseline  lifting restriction/ lifting upright posture with floor to waist lifting (pt. also restricted by L shoulder)    Time  4    Period  Weeks    Status  New    Target Date  06/29/18      PT SHORT TERM GOAL #3   Title  Pt. will report no R SI/lumbar tenderness with palpation to improve pain-free mobility.      Baseline  (+) R SI tenderness.  Pt. reports no pain at rest in sitting/standing posture.     Time  4    Period  Weeks    Status  New    Target Date  06/29/18      PT SHORT TERM GOAL #4   Title  Pt. able to ride motorcycle with no c/o R low back pain.      Baseline  Pt. planning on riding to [place] but concerned about low back pain.    Time  4    Period  Weeks    Status  New    Target Date  06/29/18        PT Long Term Goals - 05/17/18 1519      PT LONG TERM GOAL #1   Title  Pt. will increase FOTO to 67 to improve L shoulder pain-free moblity with daily tasks.     Baseline  FOTO: initial: 36/ goal: 35; 05/17/18: Not performed    Time  4    Period  Weeks    Status  Deferred    Target Date  06/14/18      PT LONG TERM GOAL #2   Title  Pt. will incresae L shoulder AAROM to [facility] as compared to R shoulder to improve overhead reaching/ functional tasks.      Baseline  Supine L shoulder PROM: flexion 112 deg., abduction 88 deg., ER 0 deg., IR 75 deg; 05/17/18: Supine L shoulder PROM:  flexion 155 deg., abduction 150 deg., ER 60 deg., IR 72 deg;     Time  4    Period  Weeks    Status  Partially Met    Target Date  06/14/18  PT LONG TERM GOAL #3   Title  Pt. will increase L shoulder strength to grossly 4/5 MMT (flexion/ abduction/ ER) to improve progression of functional/ work-related tasks.      Baseline  No strength testing at this time; 05/17/18: Deferred strength testing     Time  4    Period  Weeks    Status  Deferred    Target Date  06/14/18      PT LONG TERM GOAL #4   Title  Pt. will report <3/10 L shoulder pain at worst with progression of ROM ex.    Baseline  4-5/10 L shoulder pain at rest/ >9/10 at worst (no pain medications); 05/17/18: worst pain 4-5/10;    Time  4    Period  Weeks    Status  Partially Met    Target Date  06/14/18            Plan - 06/10/18 1636    Clinical Impression Statement  Patient presents to clinic with excellent motivation to participate in therapy. Patient demonstrates deficits in L shoulder ER ROM, L shoulder strength, and L shoulder function as evidenced by continued difficulty with AROM external rotation above 60 degrees of abduction and commensurate strength deficits with s/p L rotator cuff repair. Patient able to achieve appropriate form performing shoulder strengthening with increased load during today's session and responded positively to active interventions. Patient will benefit from continued skilled therapeutic intervention to address remaining deficits in L shoulder ROM, strength, and function in order to return to full participation in the community and at work, increase function, and improve overall QOL.    Stability/Clinical Decision Making  Stable/Uncomplicated    Rehab Potential  Excellent    PT Frequency  2x / week    PT Duration  4 weeks    PT Treatment/Interventions  Aquatic Therapy;Cryotherapy;Electrical Stimulation;Iontophoresis 60m/ml Dexamethasone;Moist Heat;Functional mobility training;Stair  training;Therapeutic activities;Therapeutic exercise;Balance training;Neuromuscular re-education;Patient/family education;Manual techniques;Dry needling;Passive range of motion;Taping;Spinal Manipulations;Joint Manipulations    Consulted and Agree with Plan of Care  Patient       Patient will benefit from skilled therapeutic intervention in order to improve the following deficits and impairments:  Pain, Improper body mechanics, Decreased mobility, Increased muscle spasms, Postural dysfunction, Difficulty walking, Decreased strength, Decreased range of motion, Decreased activity tolerance, Decreased endurance, Hypomobility  Visit Diagnosis: Acute pain of left shoulder  S/P left rotator cuff repair  Shoulder joint stiffness, left  Muscle weakness (generalized)     Problem List Patient Active Problem List   Diagnosis Date Noted  . ED (erectile dysfunction) of organic origin 02/21/2015  . Diabetes ([place] 10/18/2014  . Hyperlipemia 10/18/2014   KMyles GipPT, DPT #571 [id number] 86516/04/2018, 4:46 PM  [place] [facility]MS[facility] [address]Phone: [phone]  Fax:  [phone] Name: Curtis KAPUSTAMRN: [id number] of Birth: [birth year]

## 2018-06-10 NOTE — Therapy (Signed)
[facility] [address]. [place], [place], [address] Phone: [phone]   Fax:  [phone]  Physical Therapy Treatment  Patient Details  Name: Curtis Singh MRN: [id number] Date of Birth: 05-Feb-1955 Referring Provider (PT): Dr. Lynann Bologna   Encounter Date: 06/10/2018  PT End of Session - 06/10/18 1506    Visit Number  3    Number of Visits  8    Date for PT Re-Evaluation  06/29/18    PT Start Time  5993    PT Stop Time  1644    PT Time Calculation (min)  46 min    Activity Tolerance  Patient tolerated treatment well    Behavior During Therapy  [facility] for tasks assessed/performed       Past Medical History:  Diagnosis Date  . Diabetes ([place])    type 2 x 14 years  . ED (erectile dysfunction)   . GERD (gastroesophageal reflux disease)   . 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 Hypertension   . Seasonal allergies     Past Surgical History:  Procedure Laterality Date  . ARTHOSCOPIC ROTAOR CUFF REPAIR Left 02/25/2018   Procedure: LEFT ARTHROSCOPIC ROTATOR CUFF REPAIR; SUBACROMIAL DECOMPRESSION;  Surgeon: Tania Ade, MD;  [facility]: [facility];  Service: Orthopedics;  Laterality: Left;  . CATARACT EXTRACTION W/PHACO Right 09/03/2016   Procedure: CATARACT EXTRACTION PHACO AND INTRAOCULAR LENS PLACEMENT ([place]) RIGHT DIABETIC;  Surgeon: Leandrew Koyanagi, MD;  Location: [place];  Service: Ophthalmology;  Laterality: Right;  DIABETIC  . ROTATOR CUFF REPAIR Right 2009    There were no vitals filed for this visit.  Subjective Assessment - 06/10/18 1501    Subjective  Patient reporting back is in a lot of pain after lifting all day at work. Used TENS unit before coming for 45 minutes to modulate pain. Reports increased fatigue as well as pain.    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 He was moving boxes while working and he picked 2 boxes up from roughly waist/chest  height and rotated L with the boxes to set them down. As soon as he put the boxes down, he felt pain his R low back which has spread across the whole low back, but R>L. Patient is currently not picking up anything >20lbs and is to avoid bending, twisting, lifting.  Pt. was being treated for LBP up until L RTC repair surgery on 02/25/2018.  Pt. is hoping to return to work on 03/22/2018 with limited duty secondary to shoulder limitations/ protocol.       Limitations  Lifting;Standing;Walking;House hold activities;Sitting    Diagnostic tests  (-) MRI    Patient Stated Goals  Pain-free movement with work-related tasks/ riding motorcycle    Currently in Pain?  Yes    Pain Score  6     Pain Location  Back    Pain Orientation  Right    Pain Descriptors / Indicators  Aching    Pain Type  Chronic pain    Pain Onset  More than a month ago       TREATMENT  Manual Therapy: STM to superior gluteals SIJ mobilization, B for pain modulation, 30s bouts x3, grade I-II  Neuromuscular Re-education: Hooklying lower trunk rotations, x10 B Hooklying pelvic tilts, x20 for increased lumbopelvic control TA activation x20 with coordinated breath pattern TA activation with pelvic tilt x10 with coordinated breath pattern TA activation with mini  marches, alternating x20, with coordinated breath pattern  TA activation with overhead reach 2#, alternating, x20 with coordinated breath pattern Dead bug, modified, 2# dumb bells, x10 with coordinated breath pattern Anti-extension walk out, BTB, x5 Pallof Press, anti-R rotation, BTB x15 Anti-R rotation side steps, BTB, x15  Patient educated throughout session on appropriate technique and form using multi-modal cueing, HEP, and activity modification. Patient articulated understanding and returned demonstration.  Patient Response to interventions: Patient reports 0/10 pain at end of session.   ASSESSMENT Patient presents to clinic with excellent motivation to  participate in therapy. Patient demonstrates deficits in lumbopelvic control, postural endurance, pain, and trunk strength. Patient able to achieve coordinated and controlled extremity movement with minimal compensatory patterns at trunk during today's session and responded positively to neuromuscular re-education interventions. Patient will benefit from continued skilled therapeutic intervention to address remaining deficits in lumbopelvic control, postural endurance, pain, and trunk strength in order to participate fully in work duties, increase function, and improve overall QOL.      PT Short Term Goals - 06/08/18 0734      PT SHORT TERM GOAL #1   Title  Pt. will increase FOTO to 65 to improve pain-free mobility with lifting/ work-related tasks.    Baseline  initial FOTO on 06/01/2018:  59    Time  4    Period  Weeks    Status  New    Target Date  06/29/18      PT SHORT TERM GOAL #2   Title  Pt. able to demonstrate proper lifting technique of 40# box with no c/o R low back pain to improve work-related tasks.     Baseline  lifting restriction/ lifting upright posture with floor to waist lifting (pt. also restricted by L shoulder)    Time  4    Period  Weeks    Status  New    Target Date  06/29/18      PT SHORT TERM GOAL #3   Title  Pt. will report no R SI/lumbar tenderness with palpation to improve pain-free mobility.      Baseline  (+) R SI tenderness.  Pt. reports no pain at rest in sitting/standing posture.     Time  4    Period  Weeks    Status  New    Target Date  06/29/18      PT SHORT TERM GOAL #4   Title  Pt. able to ride motorcycle with no c/o R low back pain.      Baseline  Pt. planning on riding to [place] but concerned about low back pain.    Time  4    Period  Weeks    Status  New    Target Date  06/29/18        PT Long Term Goals - 05/17/18 1519      PT LONG TERM GOAL #1   Title  Pt. will increase FOTO to 67 to improve L shoulder pain-free moblity with daily  tasks.     Baseline  FOTO: initial: 36/ goal: 46; 05/17/18: Not performed    Time  4    Period  Weeks    Status  Deferred    Target Date  06/14/18      PT LONG TERM GOAL #2   Title  Pt. will incresae L shoulder AAROM to [facility] as compared to R shoulder to improve overhead reaching/ functional tasks.      Baseline  Supine L shoulder PROM:  flexion 112 deg., abduction 88 deg., ER 0 deg., IR 75 deg; 05/17/18: Supine L shoulder PROM: flexion 155 deg., abduction 150 deg., ER 60 deg., IR 72 deg;     Time  4    Period  Weeks    Status  Partially Met    Target Date  06/14/18      PT LONG TERM GOAL #3   Title  Pt. will increase L shoulder strength to grossly 4/5 MMT (flexion/ abduction/ ER) to improve progression of functional/ work-related tasks.      Baseline  No strength testing at this time; 05/17/18: Deferred strength testing     Time  4    Period  Weeks    Status  Deferred    Target Date  06/14/18      PT LONG TERM GOAL #4   Title  Pt. will report <3/10 L shoulder pain at worst with progression of ROM ex.    Baseline  4-5/10 L shoulder pain at rest/ >9/10 at worst (no pain medications); 05/17/18: worst pain 4-5/10;    Time  4    Period  Weeks    Status  Partially Met    Target Date  06/14/18            Plan - 06/10/18 1507    Clinical Impression Statement  Patient presents to clinic with excellent motivation to participate in therapy. Patient demonstrates deficits in lumbopelvic control, postural endurance, pain, and trunk strength. Patient able to achieve coordinated and controlled extremity movement with minimal compensatory patterns at trunk during today's session and responded positively to neuromuscular re-education interventions. Patient will benefit from continued skilled therapeutic intervention to address remaining deficits in lumbopelvic control, postural endurance, pain, and trunk strength in order to participate fully in work duties, increase function, and improve overall QOL.     Stability/Clinical Decision Making  Stable/Uncomplicated    Rehab Potential  Excellent    PT Frequency  2x / week    PT Duration  4 weeks    PT Treatment/Interventions  Aquatic Therapy;Cryotherapy;Electrical Stimulation;Iontophoresis 67m/ml Dexamethasone;Moist Heat;Functional mobility training;Stair training;Therapeutic activities;Therapeutic exercise;Balance training;Neuromuscular re-education;Patient/family education;Manual techniques;Dry needling;Passive range of motion;Taping;Spinal Manipulations;Joint Manipulations    PT Next Visit Plan  Check parameters on pts. personal TENS/ issue core stability/ hamstring stretches.   Pt. has 2 certification dates/ referring MDs.      PT Home Exercise Plan  Access Code: [email or web address](HLTR, supine piriformis stretch, single knee to chest). Page 1 and 2 transverse abdominus core progression.     Consulted and Agree with Plan of Care  Patient       Patient will benefit from skilled therapeutic intervention in order to improve the following deficits and impairments:  Pain, Improper body mechanics, Decreased mobility, Increased muscle spasms, Postural dysfunction, Difficulty walking, Decreased strength, Decreased range of motion, Decreased activity tolerance, Decreased endurance, Hypomobility  Visit Diagnosis: Sacroiliac joint dysfunction of right side  Chronic right-sided low back pain without sciatica  Gait difficulty  Acute low back pain, unspecified back pain laterality, unspecified whether sciatica present  Abnormal posture     Problem List Patient Active Problem List   Diagnosis Date Noted  . ED (erectile dysfunction) of organic origin 02/21/2015  . Diabetes ([place] 10/18/2014  . Hyperlipemia 10/18/2014   KMyles GipPT, DPT #[phone]/04/2018, 5:01 PM  [facility]MLawtey [place] [address]Phone: [phone]  Fax:  [phone] Name: GAHMERE HEMENWAYMRN: [id number]  Date of  Birth: [birth year]

## 2018-06-15 ENCOUNTER — Other Ambulatory Visit: Payer: Self-pay

## 2018-06-15 ENCOUNTER — Ambulatory Visit: Payer: No Typology Code available for payment source | Admitting: Physical Therapy

## 2018-06-15 ENCOUNTER — Ambulatory Visit: Payer: PRIVATE HEALTH INSURANCE | Admitting: Physical Therapy

## 2018-06-15 ENCOUNTER — Encounter: Payer: Self-pay | Admitting: Physical Therapy

## 2018-06-15 DIAGNOSIS — M545 Low back pain, unspecified: Secondary | ICD-10-CM

## 2018-06-15 DIAGNOSIS — M25512 Pain in left shoulder: Secondary | ICD-10-CM

## 2018-06-15 DIAGNOSIS — M533 Sacrococcygeal disorders, not elsewhere classified: Secondary | ICD-10-CM

## 2018-06-15 DIAGNOSIS — M25612 Stiffness of left shoulder, not elsewhere classified: Secondary | ICD-10-CM

## 2018-06-15 DIAGNOSIS — Z9889 Other specified postprocedural states: Secondary | ICD-10-CM | POA: Diagnosis not present

## 2018-06-15 DIAGNOSIS — M6281 Muscle weakness (generalized): Secondary | ICD-10-CM

## 2018-06-15 DIAGNOSIS — R293 Abnormal posture: Secondary | ICD-10-CM

## 2018-06-15 DIAGNOSIS — G8929 Other chronic pain: Secondary | ICD-10-CM

## 2018-06-15 NOTE — Therapy (Signed)
[facility] [address] [place], [place], [address] Phone: [phone]   Fax:  [phone]  Physical Therapy Treatment  Patient Details  Name: Curtis Singh MRN: [id number] Date of Birth: July 02, 1955 Referring Provider (PT): Dr. Lynann Bologna   Encounter Date: 06/15/2018  PT End of Session - 06/17/18 1629    Visit Number  13    Number of Visits  20    Date for PT Re-Evaluation  07/13/18    PT Start Time  1501    PT Stop Time  1536    PT Time Calculation (min)  35 min    Activity Tolerance  Patient tolerated treatment well    Behavior During Therapy  [facility] for tasks assessed/performed       Past Medical History:  Diagnosis Date  . Diabetes ([place])    type 2 x 14 years  . ED (erectile dysfunction)   . GERD (gastroesophageal reflux disease)   . 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 Hypertension   . Seasonal allergies     Past Surgical History:  Procedure Laterality Date  . ARTHOSCOPIC ROTAOR CUFF REPAIR Left 02/25/2018   Procedure: LEFT ARTHROSCOPIC ROTATOR CUFF REPAIR; SUBACROMIAL DECOMPRESSION;  Surgeon: Tania Ade, MD;  [facility];  Service: Orthopedics;  Laterality: Left;  . CATARACT EXTRACTION W/PHACO Right 09/03/2016   Procedure: CATARACT EXTRACTION PHACO AND INTRAOCULAR LENS PLACEMENT ([place]) RIGHT DIABETIC;  Surgeon: Leandrew Koyanagi, MD;  Location: [place];  Service: Ophthalmology;  Laterality: Right;  DIABETIC  . ROTATOR CUFF REPAIR Right 2009    There were no vitals filed for this visit.  Subjective Assessment - 06/17/18 1619    Subjective  Pt. entered PT with no new complaints.  Pt. states he remains active at work with lifting/ carrying tasks but limited to <10#.    Patient Stated Goals  Increase L shoulder ROM/ strength to promote pain-free moblity/ no work restrictions.    Currently in Pain?  No/denies          TREATMENT  Therapeutic Exercise: Standing ER AAROM at 45deg  10x each SupineL shoulderrhythmic stabs 30s/bout x 3 bouts with moderate manual resistance SupineLserratus punch 5#x20 Nautilus: 40# lat. Pull downs/ 50# triceps extension/ 40# shoulder extension and flexion with handles 20x each              50# chest press with wand              40# bicep curls 30x/ punches 20x.  Standing D1/ D2 with 2# wt. And mirror feedback 20x each.     Patient educated throughout session on appropriate technique and form using multi-modal cueing, HEP, and activity modification. Patient articulated understanding and returned demonstration.  Patient Response to interventions: Patient denied any increase in pain from the exercises.   PT Short Term Goals - 06/08/18 0734      PT SHORT TERM GOAL #1   Title  Pt. will increase FOTO to 65 to improve pain-free mobility with lifting/ work-related tasks.    Baseline  initial FOTO on 06/01/2018:  59    Time  4    Period  Weeks    Status  New    Target Date  06/29/18      PT SHORT TERM GOAL #2   Title  Pt. able to demonstrate proper lifting technique of 40# box with no c/o R low back pain to improve work-related tasks.     Baseline  lifting restriction/ lifting upright posture with floor to waist lifting (pt. also restricted by L shoulder)    Time  4    Period  Weeks    Status  New    Target Date  06/29/18      PT SHORT TERM GOAL #3   Title  Pt. will report no R SI/lumbar tenderness with palpation to improve pain-free mobility.      Baseline  (+) R SI tenderness.  Pt. reports no pain at rest in sitting/standing posture.     Time  4    Period  Weeks    Status  New    Target Date  06/29/18      PT SHORT TERM GOAL #4   Title  Pt. able to ride motorcycle with no c/o R low back pain.      Baseline  Pt. planning on riding to [place] but concerned about low back pain.    Time  4    Period  Weeks    Status  New    Target Date  06/29/18        PT Long Term Goals - 06/15/18 1640      PT LONG TERM GOAL #1    Title  Pt. will increase FOTO to 67 to improve L shoulder pain-free moblity with daily tasks.     Baseline  FOTO: initial: 36/ goal: 75; 05/17/18: Not performed    Time  4    Period  Weeks    Status  On-going    Target Date  07/13/18      PT LONG TERM GOAL #2   Title  Pt. will increase L shoulder AAROM to [facility] as compared to R shoulder to improve overhead reaching/ functional tasks.    Baseline  L shoulder AROM WFL as compared to R shoulder except ER 60 deg.    Time  4    Period  Weeks    Status  Partially Met    Target Date  07/13/18      PT LONG TERM GOAL #3   Title  Pt. will increase L shoulder strength to grossly 4/5 MMT (flexion/ abduction/ ER) to improve progression of functional/ work-related tasks.      Baseline  L shoulder strength grossly 4/5 MMT with no increase c/o pain.    Time  4    Period  Weeks    Status  Achieved    Target Date  07/13/18      PT LONG TERM GOAL #4   Title  Pt. will report <3/10 L shoulder pain at worst with progression of ROM ex.    Baseline  4-5/10 L shoulder pain at rest/ >9/10 at worst (no pain medications); 05/17/18: worst pain 4-5/10;    Time  4    Period  Weeks    Status  Partially Met    Target Date  07/13/18      PT LONG TERM GOAL #5   Title  Pt. will progress to independent gym based/ wt. training program to improve strength to grossly 5/5 MMT with no pain.    Baseline  See above    Time  4    Period  Weeks    Status  New    Target Date  07/13/18         Plan - 06/17/18 1633    Clinical Impression Statement  Pt. continues to progress well towards all PT goals with L shoulder ROM/ strengthening progression.  Pt. presents  to clinic with excellent motivation to participate in therapy and strengthening ex. program.  Pt. remains limited with L sh. tightness with L shoulder ER AROM, L shoulder strength, and L shoulder function as evidenced by continued difficulty with AROM external rotation above 60 degrees of abduction and commensurate  strength deficits with s/p L rotator cuff repair.  Patient will benefit from continued skilled therapeutic intervention to address remaining deficits in L shoulder ROM, strength, and function in order to return to full participation in the community and at work, increase function, and improve overall QOL.    Stability/Clinical Decision Making  Stable/Uncomplicated    Clinical Decision Making  Low    Rehab Potential  Good    PT Frequency  2x / week    PT Duration  4 weeks    PT Treatment/Interventions  ADLs/Self Care Home Management;Cryotherapy;Electrical Stimulation;Functional mobility training;Therapeutic activities;Therapeutic exercise;Patient/family education;Neuromuscular re-education;Manual techniques;Scar mobilization;Passive range of motion    PT Next Visit Plan  Progress L shoulder strengthening.    PT Home Exercise Plan  See handouts       Patient will benefit from skilled therapeutic intervention in order to improve the following deficits and impairments:  Decreased mobility, Decreased strength, Impaired flexibility, Hypomobility, Pain, Impaired UE functional use    Problem List Patient Active Problem List   Diagnosis Date Noted  . ED (erectile dysfunction) of organic origin 02/21/2015  . Diabetes ([place]) 10/18/2014  . Hyperlipemia 10/18/2014   Pura Spice, PT, DPT # [id number] 06/17/2018, 4:45 PM    [facility] [address] [place], [place], [address] Phone: [phone]   Fax:  [phone]  Name: Curtis Singh MRN: [id number] Date of Birth: [birth year]

## 2018-06-17 ENCOUNTER — Ambulatory Visit: Payer: PRIVATE HEALTH INSURANCE | Admitting: Physical Therapy

## 2018-06-17 ENCOUNTER — Other Ambulatory Visit: Payer: Self-pay

## 2018-06-17 ENCOUNTER — Encounter: Payer: Self-pay | Admitting: Physical Therapy

## 2018-06-17 ENCOUNTER — Ambulatory Visit: Payer: No Typology Code available for payment source | Admitting: Physical Therapy

## 2018-06-17 DIAGNOSIS — G8929 Other chronic pain: Secondary | ICD-10-CM

## 2018-06-17 DIAGNOSIS — M6281 Muscle weakness (generalized): Secondary | ICD-10-CM

## 2018-06-17 DIAGNOSIS — M533 Sacrococcygeal disorders, not elsewhere classified: Secondary | ICD-10-CM

## 2018-06-17 DIAGNOSIS — M25512 Pain in left shoulder: Secondary | ICD-10-CM

## 2018-06-17 DIAGNOSIS — M545 Low back pain, unspecified: Secondary | ICD-10-CM

## 2018-06-17 DIAGNOSIS — M25612 Stiffness of left shoulder, not elsewhere classified: Secondary | ICD-10-CM

## 2018-06-17 DIAGNOSIS — R293 Abnormal posture: Secondary | ICD-10-CM

## 2018-06-17 DIAGNOSIS — Z9889 Other specified postprocedural states: Secondary | ICD-10-CM | POA: Diagnosis not present

## 2018-06-17 NOTE — Therapy (Signed)
[facility] [address] [place], [place], [address] Phone: [phone]   Fax:  [phone]  Physical Therapy Treatment  Patient Details  Name: Curtis Singh MRN: [id number] Date of Birth: May 09, 1955 Referring Provider (PT): Dr. Lynann Bologna   Encounter Date: 06/15/2018  PT End of Session - 06/17/18 1653    Visit Number  4    Number of Visits  8    Date for PT Re-Evaluation  06/29/18    PT Start Time  1537    PT Stop Time  1610    PT Time Calculation (min)  33 min    Activity Tolerance  Patient tolerated treatment well    Behavior During Therapy  [facility] for tasks assessed/performed       Past Medical History:  Diagnosis Date  . Diabetes ([place])    type 2 x 14 years  . ED (erectile dysfunction)   . GERD (gastroesophageal reflux disease)   . 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 Hypertension   . Seasonal allergies     Past Surgical History:  Procedure Laterality Date  . ARTHOSCOPIC ROTAOR CUFF REPAIR Left 02/25/2018   Procedure: LEFT ARTHROSCOPIC ROTATOR CUFF REPAIR; SUBACROMIAL DECOMPRESSION;  Surgeon: Tania Ade, MD;  [facility];  Service: Orthopedics;  Laterality: Left;  . CATARACT EXTRACTION W/PHACO Right 09/03/2016   Procedure: CATARACT EXTRACTION PHACO AND INTRAOCULAR LENS PLACEMENT ([place]) RIGHT DIABETIC;  Surgeon: Leandrew Koyanagi, MD;  Location: [place];  Service: Ophthalmology;  Laterality: Right;  DIABETIC  . ROTATOR CUFF REPAIR Right 2009    There were no vitals filed for this visit.    Manual tx.:  Prone press-ups with sacral positioning Grade II-III PA L1-5 unilateral and central mobs 1x20 sec. Each.    There.ex.:  Supine LE/lumbar stretches 11 min. Progression of core stability program (cuing for pelvic tilts).        PT Short Term Goals - 06/08/18 0734      PT SHORT TERM GOAL #1   Title  Pt. will increase FOTO to 65 to improve pain-free mobility with lifting/  work-related tasks.    Baseline  initial FOTO on 06/01/2018:  59    Time  4    Period  Weeks    Status  New    Target Date  06/29/18      PT SHORT TERM GOAL #2   Title  Pt. able to demonstrate proper lifting technique of 40# box with no c/o R low back pain to improve work-related tasks.     Baseline  lifting restriction/ lifting upright posture with floor to waist lifting (pt. also restricted by L shoulder)    Time  4    Period  Weeks    Status  New    Target Date  06/29/18      PT SHORT TERM GOAL #3   Title  Pt. will report no R SI/lumbar tenderness with palpation to improve pain-free mobility.      Baseline  (+) R SI tenderness.  Pt. reports no pain at rest in sitting/standing posture.     Time  4    Period  Weeks    Status  New    Target Date  06/29/18      PT SHORT TERM GOAL #4   Title  Pt. able to ride motorcycle with no c/o R low back pain.      Baseline  Pt. planning on riding to [place]  but concerned about low back pain.    Time  4    Period  Weeks    Status  New    Target Date  06/29/18        PT Long Term Goals - 06/15/18 1640      PT LONG TERM GOAL #1   Title  Pt. will increase FOTO to 67 to improve L shoulder pain-free moblity with daily tasks.     Baseline  FOTO: initial: 36/ goal: 76; 05/17/18: Not performed    Time  4    Period  Weeks    Status  On-going    Target Date  07/13/18      PT LONG TERM GOAL #2   Title  Pt. will increase L shoulder AAROM to [facility] as compared to R shoulder to improve overhead reaching/ functional tasks.    Baseline  L shoulder AROM WFL as compared to R shoulder except ER 60 deg.    Time  4    Period  Weeks    Status  Partially Met    Target Date  07/13/18      PT LONG TERM GOAL #3   Title  Pt. will increase L shoulder strength to grossly 4/5 MMT (flexion/ abduction/ ER) to improve progression of functional/ work-related tasks.      Baseline  L shoulder strength grossly 4/5 MMT with no increase c/o pain.    Time  4    Period   Weeks    Status  Achieved    Target Date  07/13/18      PT LONG TERM GOAL #4   Title  Pt. will report <3/10 L shoulder pain at worst with progression of ROM ex.    Baseline  4-5/10 L shoulder pain at rest/ >9/10 at worst (no pain medications); 05/17/18: worst pain 4-5/10;    Time  4    Period  Weeks    Status  Partially Met    Target Date  07/13/18      PT LONG TERM GOAL #5   Title  Pt. will progress to independent gym based/ wt. training program to improve strength to grossly 5/5 MMT with no pain.    Baseline  See above    Time  4    Period  Weeks    Status  New    Target Date  07/13/18         Plan - 06/17/18 1655    Clinical Impression Statement  Pt. presents with good standing lumbar flexion/extension/ rotation with no increase c/o pain.  (+) R SI/ lumbar tenderness remains with overpressure in prone position.  Good lumbar mobility/ rotation with spinal reassessment.  Pt. requires cuing with supine pelvic tilts/ core muscle control with improvement note during bridging/ LE ex.  No change to HEP at this time.    Stability/Clinical Decision Making  Stable/Uncomplicated    Clinical Decision Making  Low    Rehab Potential  Excellent    PT Frequency  2x / week    PT Duration  4 weeks    PT Treatment/Interventions  Aquatic Therapy;Cryotherapy;Electrical Stimulation;Iontophoresis 72m/ml Dexamethasone;Moist Heat;Functional mobility training;Stair training;Therapeutic activities;Therapeutic exercise;Balance training;Neuromuscular re-education;Patient/family education;Manual techniques;Dry needling;Passive range of motion;Taping;Spinal Manipulations;Joint Manipulations    PT Next Visit Plan  Check parameters on pts. personal TENS/ issue core stability/ hamstring stretches.    PT Home Exercise Plan  Access Code: [email or web address](HLTR, supine piriformis stretch, single knee to chest). Page 1 and 2 transverse abdominus core progression.  Consulted and Agree with Plan of Care  Patient        Patient will benefit from skilled therapeutic intervention in order to improve the following deficits and impairments:  Pain, Improper body mechanics, Decreased mobility, Increased muscle spasms, Postural dysfunction, Difficulty walking, Decreased strength, Decreased range of motion, Decreased activity tolerance, Decreased endurance, Hypomobility  Visit Diagnosis: SI joint dysfunction right side Low back pain   Problem List Patient Active Problem List   Diagnosis Date Noted  . ED (erectile dysfunction) of organic origin 02/21/2015  . Diabetes ([place]) 10/18/2014  . Hyperlipemia 10/18/2014   Pura Spice, PT, DPT # [phone] 06/17/2018, 5:03 PM  [facility] [address] [place], [place], [address] Phone: [phone]   Fax:  [phone]  Name: ANDRU GENTER MRN: [id number] Date of Birth: 03-25-1955

## 2018-06-18 ENCOUNTER — Encounter: Payer: Self-pay | Admitting: Physical Therapy

## 2018-06-18 NOTE — Therapy (Signed)
[facility] [address] [place], [place], [address] Phone: [phone]   Fax:  [phone]  Physical Therapy Treatment  Patient Details  Name: Curtis Singh MRN: [id number] Date of Birth: Oct 16, 1955 Referring Provider (PT): Dr. Lynann Bologna   Encounter Date: 06/17/2018  PT End of Session - 06/18/18 1551    Visit Number  5    Number of Visits  8    Date for PT Re-Evaluation  06/29/18    PT Start Time  1501    PT Stop Time  1535    PT Time Calculation (min)  34 min    Activity Tolerance  Patient tolerated treatment well    Behavior During Therapy  [facility] for tasks assessed/performed       Past Medical History:  Diagnosis Date  . Diabetes ([place])    type 2 x 14 years  . ED (erectile dysfunction)   . GERD (gastroesophageal reflux disease)   . 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 Hypertension   . Seasonal allergies     Past Surgical History:  Procedure Laterality Date  . ARTHOSCOPIC ROTAOR CUFF REPAIR Left 02/25/2018   Procedure: LEFT ARTHROSCOPIC ROTATOR CUFF REPAIR; SUBACROMIAL DECOMPRESSION;  Surgeon: Tania Ade, MD;  [facility];  Service: Orthopedics;  Laterality: Left;  . CATARACT EXTRACTION W/PHACO Right 09/03/2016   Procedure: CATARACT EXTRACTION PHACO AND INTRAOCULAR LENS PLACEMENT ([place]) RIGHT DIABETIC;  Surgeon: Leandrew Koyanagi, MD;  Location: [place];  Service: Ophthalmology;  Laterality: Right;  DIABETIC  . ROTATOR CUFF REPAIR Right 2009    There were no vitals filed for this visit.  Subjective Assessment - 06/18/18 1549    Subjective  Pt. reports no increase c/o low back pain at this time.  Pt. has continued to require use of personal TENS to manage pain with daily tasks.    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 As soon as he put the boxes down, he felt pain his R low back which has spread across the whole low back, but R>L. Patient is currently not picking up anything >20lbs and is to avoid bending, twisting, lifting.  Pt. was being treated for LBP up until L RTC repair surgery on 02/25/2018.  Pt. is hoping to return to work on 03/22/2018 with limited duty secondary to shoulder limitations/ protocol.       Limitations  Lifting;Standing;Walking;House hold activities;Sitting    Diagnostic tests  (-) MRI    Patient Stated Goals  Pain-free movement with work-related tasks/ riding motorcycle    Currently in Pain?  No/denies         There.ex.:  Reassessment of standing lumbar AROM (all planes)  Manual tx.:  Supine hamstring/ piriformis/ ITB/ hip stretches (14 min.) Prone press-ups with manual overpressure/ sacral positioning Prone grade II-III PA mobs. To L1-L5 region 2x20 sec. (central/ unilateral) STM to R lumbar/ superior gluteal musculature.     PT Short Term Goals - 06/08/18 0734      PT SHORT TERM GOAL #1   Title  Pt. will increase FOTO to 65 to improve pain-free mobility with lifting/ work-related tasks.    Baseline  initial FOTO on 06/01/2018:  59    Time  4    Period  Weeks  Status  New    Target Date  06/29/18      PT SHORT TERM GOAL #2   Title  Pt. able to demonstrate proper lifting technique of 40# box with no c/o R low back pain to improve work-related tasks.     Baseline  lifting restriction/ lifting upright posture with floor to waist lifting (pt. also restricted by L shoulder)    Time  4    Period  Weeks    Status  New    Target Date  06/29/18      PT SHORT TERM GOAL #3   Title  Pt. will report no R SI/lumbar tenderness with palpation to improve pain-free mobility.      Baseline  (+) R SI tenderness.  Pt. reports no pain at rest in sitting/standing posture.     Time  4    Period  Weeks    Status  New    Target Date  06/29/18      PT SHORT TERM GOAL #4    Title  Pt. able to ride motorcycle with no c/o R low back pain.      Baseline  Pt. planning on riding to [place] but concerned about low back pain.    Time  4    Period  Weeks    Status  New    Target Date  06/29/18        PT Long Term Goals - 06/15/18 1640      PT LONG TERM GOAL #1   Title  Pt. will increase FOTO to 67 to improve L shoulder pain-free moblity with daily tasks.     Baseline  FOTO: initial: 36/ goal: 19; 05/17/18: Not performed    Time  4    Period  Weeks    Status  On-going    Target Date  07/13/18      PT LONG TERM GOAL #2   Title  Pt. will increase L shoulder AAROM to [facility] as compared to R shoulder to improve overhead reaching/ functional tasks.    Baseline  L shoulder AROM WFL as compared to R shoulder except ER 60 deg.    Time  4    Period  Weeks    Status  Partially Met    Target Date  07/13/18      PT LONG TERM GOAL #3   Title  Pt. will increase L shoulder strength to grossly 4/5 MMT (flexion/ abduction/ ER) to improve progression of functional/ work-related tasks.      Baseline  L shoulder strength grossly 4/5 MMT with no increase c/o pain.    Time  4    Period  Weeks    Status  Achieved    Target Date  07/13/18      PT LONG TERM GOAL #4   Title  Pt. will report <3/10 L shoulder pain at worst with progression of ROM ex.    Baseline  4-5/10 L shoulder pain at rest/ >9/10 at worst (no pain medications); 05/17/18: worst pain 4-5/10;    Time  4    Period  Weeks    Status  Partially Met    Target Date  07/13/18      PT LONG TERM GOAL #5   Title  Pt. will progress to independent gym based/ wt. training program to improve strength to grossly 5/5 MMT with no pain.    Baseline  See above    Time  4    Period  Weeks    Status  New    Target Date  07/13/18         Plan - 06/18/18 1553    Clinical Impression Statement  Good lumbar flexion/ extension in standing posture.  Prone press-up with manual feedback to lumbar/ sacral region.  No rotn. or asymmetry  noted during prone ext.  No limitations with increase wt. bearing through L UE/shoulder.  Slight tenderness at R SI joint with deep palpation.  No radicular symptoms.    Stability/Clinical Decision Making  Stable/Uncomplicated    Rehab Potential  Excellent    PT Frequency  2x / week    PT Duration  4 weeks    PT Treatment/Interventions  Aquatic Therapy;Cryotherapy;Electrical Stimulation;Iontophoresis 64m/ml Dexamethasone;Moist Heat;Functional mobility training;Stair training;Therapeutic activities;Therapeutic exercise;Balance training;Neuromuscular re-education;Patient/family education;Manual techniques;Dry needling;Passive range of motion;Taping;Spinal Manipulations;Joint Manipulations    PT Next Visit Plan  Check parameters on pts. personal TENS/ issue core stability/ hamstring stretches.    PT Home Exercise Plan  Access Code: [email or web address](HLTR, supine piriformis stretch, single knee to chest). Page 1 and 2 transverse abdominus core progression.     Consulted and Agree with Plan of Care  Patient       Patient will benefit from skilled therapeutic intervention in order to improve the following deficits and impairments:  Pain, Improper body mechanics, Decreased mobility, Increased muscle spasms, Postural dysfunction, Difficulty walking, Decreased strength, Decreased range of motion, Decreased activity tolerance, Decreased endurance, Hypomobility  Visit Diagnosis: Sacroiliac joint dysfunction of right side  Acute low back pain, unspecified back pain laterality, unspecified whether sciatica present  Chronic right-sided low back pain without sciatica  Abnormal posture  Muscle weakness (generalized)     Problem List Patient Active Problem List   Diagnosis Date Noted  . ED (erectile dysfunction) of organic origin 02/21/2015  . Diabetes ([place] 10/18/2014  . Hyperlipemia 10/18/2014   MPura Spice PT, DPT # [phone]/12/2018, 3:58 PM  [place] [facility] [address]Phone: [phone]  Fax:  [phone] Name: GDION SIBALMRN: [id number] of Birth: [birth year]

## 2018-06-18 NOTE — Therapy (Signed)
[facility] [address] [place], [place], [address] Phone: [phone]   Fax:  [phone]  Physical Therapy Treatment  Patient Details  Name: Curtis Singh MRN: [id number] Date of Birth: 12-15-1955 Referring Provider (PT): Dr. Lynann Bologna   Encounter Date: 06/17/2018  PT End of Session - 06/18/18 1608    Visit Number  14    Number of Visits  20    Date for PT Re-Evaluation  07/13/18    PT Start Time  1536    PT Stop Time  1608    PT Time Calculation (min)  32 min    Activity Tolerance  Patient tolerated treatment well    Behavior During Therapy  [facility] for tasks assessed/performed       Past Medical History:  Diagnosis Date  . Diabetes ([place])    type 2 x 14 years  . ED (erectile dysfunction)   . GERD (gastroesophageal reflux disease)   . 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 Hypertension   . Seasonal allergies     Past Surgical History:  Procedure Laterality Date  . ARTHOSCOPIC ROTAOR CUFF REPAIR Left 02/25/2018   Procedure: LEFT ARTHROSCOPIC ROTATOR CUFF REPAIR; SUBACROMIAL DECOMPRESSION;  Surgeon: Tania Ade, MD;  [facility];  Service: Orthopedics;  Laterality: Left;  . CATARACT EXTRACTION W/PHACO Right 09/03/2016   Procedure: CATARACT EXTRACTION PHACO AND INTRAOCULAR LENS PLACEMENT ([place]) RIGHT DIABETIC;  Surgeon: Leandrew Koyanagi, MD;  Location: [place];  Service: Ophthalmology;  Laterality: Right;  DIABETIC  . ROTATOR CUFF REPAIR Right 2009    There were no vitals filed for this visit.  Subjective Assessment - 06/18/18 1602    Subjective  Pt. c/o 3-4/10 L shoulder pain currently.  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 As soon as he put the boxes down, he felt pain his R low back which has spread across the whole low back, but R>L. Patient is currently not picking up anything >20lbs and is to avoid bending, twisting, lifting.  Pt. was being treated for LBP up until L RTC repair surgery on 02/25/2018.  Pt. is hoping to return to work on 03/22/2018 with limited duty secondary to shoulder limitations/ protocol.       Limitations  Lifting;Standing;Walking;House hold activities;Sitting    Diagnostic tests  (-) MRI    Patient Stated Goals  Pain-free movement with work-related tasks/ riding motorcycle    Currently in Pain?  Yes    Pain Score  4     Pain Location  Shoulder    Pain Orientation  Left    Pain Descriptors / Indicators  Aching;Sore    Pain Onset  More than a month ago          TREATMENT  Therapeutic Exercise: Standing ER AAROM at 45deg10x each SupineL shoulderrhythmic stabs 30s/bout x 3boutswith moderate manual resistance SupineLserratus punch 5#x20 Nautilus: 40# lat. Pull downs/50#tricepsextension/ 40# shoulder extension and flexion with handles/ 40# sh. adduction 20x each.  50# chest press with wand25x.  Bodyblade (all planes)- 30 sec. Each x 5.  Pharmacist, hospital.   Patient educated throughout session on appropriate technique and form using multi-modal cueing, HEP, and activity  modification. Patient articulated understanding and returned demonstration.  Patient Response to interventions: Patient denied any increase in pain from the exercises.     PT Short Term Goals - 06/08/18 0734      PT SHORT TERM GOAL #1   Title  Pt. will increase FOTO to 65 to improve pain-free mobility with lifting/ work-related tasks.    Baseline  initial FOTO on 06/01/2018:  59    Time  4    Period  Weeks    Status  New    Target Date  06/29/18      PT SHORT TERM GOAL #2   Title  Pt. able to demonstrate proper lifting technique of 40# box with no c/o R low  back pain to improve work-related tasks.     Baseline  lifting restriction/ lifting upright posture with floor to waist lifting (pt. also restricted by L shoulder)    Time  4    Period  Weeks    Status  New    Target Date  06/29/18      PT SHORT TERM GOAL #3   Title  Pt. will report no R SI/lumbar tenderness with palpation to improve pain-free mobility.      Baseline  (+) R SI tenderness.  Pt. reports no pain at rest in sitting/standing posture.     Time  4    Period  Weeks    Status  New    Target Date  06/29/18      PT SHORT TERM GOAL #4   Title  Pt. able to ride motorcycle with no c/o R low back pain.      Baseline  Pt. planning on riding to [place] but concerned about low back pain.    Time  4    Period  Weeks    Status  New    Target Date  06/29/18        PT Long Term Goals - 06/15/18 1640      PT LONG TERM GOAL #1   Title  Pt. will increase FOTO to 67 to improve L shoulder pain-free moblity with daily tasks.     Baseline  FOTO: initial: 36/ goal: 28; 05/17/18: Not performed    Time  4    Period  Weeks    Status  On-going    Target Date  07/13/18      PT LONG TERM GOAL #2   Title  Pt. will increase L shoulder AAROM to [facility] as compared to R shoulder to improve overhead reaching/ functional tasks.    Baseline  L shoulder AROM WFL as compared to R shoulder except ER 60 deg.    Time  4    Period  Weeks    Status  Partially Met    Target Date  07/13/18      PT LONG TERM GOAL #3   Title  Pt. will increase L shoulder strength to grossly 4/5 MMT (flexion/ abduction/ ER) to improve progression of functional/ work-related tasks.      Baseline  L shoulder strength grossly 4/5 MMT with no increase c/o pain.    Time  4    Period  Weeks    Status  Achieved    Target Date  07/13/18      PT LONG TERM GOAL #4   Title  Pt. will report <3/10 L shoulder pain at worst with progression of ROM ex.    Baseline  4-5/10 L shoulder pain at rest/ >9/10 at  worst (no pain medications); 05/17/18:  worst pain 4-5/10;    Time  4    Period  Weeks    Status  Partially Met    Target Date  07/13/18      PT LONG TERM GOAL #5   Title  Pt. will progress to independent gym based/ wt. training program to improve strength to grossly 5/5 MMT with no pain.    Baseline  See above    Time  4    Period  Weeks    Status  New    Target Date  07/13/18            Plan - 06/18/18 1609    Clinical Impression Statement  Pt. has excellent motivation with L shoulder strengthening progression.  Good technique and progress with resisted UE ther.ex./ use of bodyblade.  No ROM limitations noted except ER, which is improving.    Stability/Clinical Decision Making  Stable/Uncomplicated    Rehab Potential  Good    PT Frequency  2x / week    PT Duration  4 weeks    PT Treatment/Interventions  ADLs/Self Care Home Management;Cryotherapy;Electrical Stimulation;Functional mobility training;Therapeutic activities;Therapeutic exercise;Patient/family education;Neuromuscular re-education;Manual techniques;Scar mobilization;Passive range of motion    PT Next Visit Plan  Progress L shoulder strengthening.    PT Home Exercise Plan  See handouts       Patient will benefit from skilled therapeutic intervention in order to improve the following deficits and impairments:  Decreased mobility, Decreased strength, Impaired flexibility, Hypomobility, Pain, Impaired UE functional use  Visit Diagnosis: Acute pain of left shoulder  S/P left rotator cuff repair  Shoulder joint stiffness, left  Muscle weakness (generalized)     Problem List Patient Active Problem List   Diagnosis Date Noted  . ED (erectile dysfunction) of organic origin 02/21/2015  . Diabetes ([place]) 10/18/2014  . Hyperlipemia 10/18/2014   Pura Spice, PT, DPT # [phone] 06/18/2018, 4:15 PM  [facility] [address] [place], [place], [address] Phone: [phone]   Fax:  [phone]  Name: KESHON MARKOVITZ MRN: [id number] Date of Birth: Feb 18, 1955

## 2018-06-22 ENCOUNTER — Ambulatory Visit: Payer: PRIVATE HEALTH INSURANCE | Admitting: Physical Therapy

## 2018-06-22 ENCOUNTER — Ambulatory Visit: Payer: No Typology Code available for payment source | Admitting: Physical Therapy

## 2018-06-22 ENCOUNTER — Other Ambulatory Visit: Payer: Self-pay

## 2018-06-22 DIAGNOSIS — M533 Sacrococcygeal disorders, not elsewhere classified: Secondary | ICD-10-CM | POA: Diagnosis not present

## 2018-06-22 DIAGNOSIS — M545 Low back pain, unspecified: Secondary | ICD-10-CM

## 2018-06-22 DIAGNOSIS — M6281 Muscle weakness (generalized): Secondary | ICD-10-CM

## 2018-06-22 DIAGNOSIS — Z9889 Other specified postprocedural states: Secondary | ICD-10-CM | POA: Diagnosis not present

## 2018-06-22 DIAGNOSIS — R293 Abnormal posture: Secondary | ICD-10-CM

## 2018-06-22 DIAGNOSIS — G8929 Other chronic pain: Secondary | ICD-10-CM

## 2018-06-22 DIAGNOSIS — M25612 Stiffness of left shoulder, not elsewhere classified: Secondary | ICD-10-CM

## 2018-06-22 DIAGNOSIS — M25512 Pain in left shoulder: Secondary | ICD-10-CM

## 2018-06-23 ENCOUNTER — Encounter: Payer: Self-pay | Admitting: Physical Therapy

## 2018-06-23 NOTE — Therapy (Signed)
[facility] [address] [place], [place], [address] Phone: [phone]   Fax:  [phone]  Physical Therapy Treatment  Patient Details  Name: Curtis Singh MRN: [id number] Date of Birth: 19-Jan-1955 Referring Provider (PT): Dr. Lynann Bologna   Encounter Date: 06/22/2018  PT End of Session - 06/23/18 1417    Visit Number  6    Number of Visits  8    Date for PT Re-Evaluation  06/29/18    PT Start Time  1501    PT Stop Time  1535    PT Time Calculation (min)  34 min    Activity Tolerance  Patient tolerated treatment well    Behavior During Therapy  [facility] for tasks assessed/performed       Past Medical History:  Diagnosis Date  . Diabetes ([place])    type 2 x 14 years  . ED (erectile dysfunction)   . GERD (gastroesophageal reflux disease)   . 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 Hypertension   . Seasonal allergies     Past Surgical History:  Procedure Laterality Date  . ARTHOSCOPIC ROTAOR CUFF REPAIR Left 02/25/2018   Procedure: LEFT ARTHROSCOPIC ROTATOR CUFF REPAIR; SUBACROMIAL DECOMPRESSION;  Surgeon: Tania Ade, MD;  [facility];  Service: Orthopedics;  Laterality: Left;  . CATARACT EXTRACTION W/PHACO Right 09/03/2016   Procedure: CATARACT EXTRACTION PHACO AND INTRAOCULAR LENS PLACEMENT ([place]) RIGHT DIABETIC;  Surgeon: Leandrew Koyanagi, MD;  Location: [place];  Service: Ophthalmology;  Laterality: Right;  DIABETIC  . ROTATOR CUFF REPAIR Right 2009    There were no vitals filed for this visit.  Subjective Assessment - 06/23/18 1415    Subjective  Pt. reports no increase c/o low back pain at this time.  Pt. rode motorcycle to [place] over the weekend with no exacerbation of low back symptoms.  Pt. did require use of TENS to manage symptoms after work yesterday.  Pt. states he has been really busy with work-related tasks over past couple of week.    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 He was moving boxes while working and he picked 2 boxes up from roughly waist/chest height and rotated L with the boxes to set them down. As soon as he put the boxes down, he felt pain his R low back which has spread across the whole low back, but R>L. Patient is currently not picking up anything >20lbs and is to avoid bending, twisting, lifting.  Pt. was being treated for LBP up until L RTC repair surgery on 02/25/2018.  Pt. is hoping to return to work on 03/22/2018 with limited duty secondary to shoulder limitations/ protocol.       Limitations  Lifting;Standing;Walking;House hold activities;Sitting    Diagnostic tests  (-) MRI    Patient Stated Goals  Pain-free movement with work-related tasks/ riding motorcycle    Currently in Pain?  No/denies         Manual tx.:  Supine hamstring/ piriformis/ ITB/ hip stretches (12 min.) Prone press-ups with manual overpressure/ sacral positioning Prone grade II-III PA mobs. To L1-L5 region 2x20 sec. (central/ unilateral) STM to R lumbar/ superior gluteal musculature.     PT Short Term Goals - 06/08/18 0734      PT SHORT TERM GOAL #1   Title  Pt. will increase FOTO to 65 to improve pain-free mobility with lifting/ work-related tasks.    Baseline  initial  FOTO on 06/01/2018:  31    Time  4    Period  Weeks    Status  New    Target Date  06/29/18      PT SHORT TERM GOAL #2   Title  Pt. able to demonstrate proper lifting technique of 40# box with no c/o R low back pain to improve work-related tasks.     Baseline  lifting restriction/ lifting upright posture with floor to waist lifting (pt. also restricted by L shoulder)    Time  4    Period  Weeks    Status  New    Target Date  06/29/18      PT SHORT TERM GOAL #3   Title  Pt. will report no R SI/lumbar tenderness with palpation to improve pain-free mobility.      Baseline  (+) R SI tenderness.  Pt. reports no pain at rest in sitting/standing posture.      Time  4    Period  Weeks    Status  New    Target Date  06/29/18      PT SHORT TERM GOAL #4   Title  Pt. able to ride motorcycle with no c/o R low back pain.      Baseline  Pt. planning on riding to [place] but concerned about low back pain.    Time  4    Period  Weeks    Status  New    Target Date  06/29/18          Plan - 06/23/18 1418    Clinical Impression Statement  No lumbar paraspinal muscle tenderness noted during manual tx./ STM in prone position.  Good lumbar extension with overpressure provided at lumbar/sacral region.  (+) R SI tenderness with deep palpation and symmetry noted in sacral region with lumbar flexion/ extension.  No rotn. limiations noted.  Cuing provided t/o PT tx. for back and L shoulder to activate core with standing/ resisted ex.    Stability/Clinical Decision Making  Stable/Uncomplicated    Clinical Decision Making  Low    Rehab Potential  Excellent    PT Frequency  2x / week    PT Duration  4 weeks    PT Treatment/Interventions  Aquatic Therapy;Cryotherapy;Electrical Stimulation;Iontophoresis 4mg /ml Dexamethasone;Moist Heat;Functional mobility training;Stair training;Therapeutic activities;Therapeutic exercise;Balance training;Neuromuscular re-education;Patient/family education;Manual techniques;Dry needling;Passive range of motion;Taping;Spinal Manipulations;Joint Manipulations    PT Next Visit Plan  Reassess goals for low back/ progress core stability ex.    PT Home Exercise Plan  Access Code: [email or web address] (HLTR, supine piriformis stretch, single knee to chest). Page 1 and 2 transverse abdominus core progression.     Consulted and Agree with Plan of Care  Patient       Patient will benefit from skilled therapeutic intervention in order to improve the following deficits and impairments:  Pain, Improper body mechanics, Decreased mobility, Increased muscle spasms, Postural dysfunction, Difficulty walking, Decreased strength, Decreased range of motion, Decreased  activity tolerance, Decreased endurance, Hypomobility  Visit Diagnosis: 1. Acute low back pain, unspecified back pain laterality, unspecified whether sciatica present   2. Sacroiliac joint dysfunction of right side   3. Chronic right-sided low back pain without sciatica   4. Abnormal posture        Problem List Patient Active Problem List   Diagnosis Date Noted  . ED (erectile dysfunction) of organic origin 02/21/2015  . Diabetes ([place]) 10/18/2014  . Hyperlipemia 10/18/2014   Pura Spice, PT, DPT # [phone] 06/23/2018,  2:25 PM  [place] [facility] [address] [place], [place], [address] Phone: [phone]   Fax:  [phone]  Name: SPYRIDON HORNSTEIN MRN: [id number] Date of Birth: Oct 20, 1955

## 2018-06-24 ENCOUNTER — Ambulatory Visit: Payer: No Typology Code available for payment source | Admitting: Physical Therapy

## 2018-06-24 ENCOUNTER — Other Ambulatory Visit: Payer: Self-pay

## 2018-06-24 ENCOUNTER — Ambulatory Visit: Payer: PRIVATE HEALTH INSURANCE | Admitting: Physical Therapy

## 2018-06-24 ENCOUNTER — Encounter: Payer: Self-pay | Admitting: Physical Therapy

## 2018-06-24 DIAGNOSIS — M25612 Stiffness of left shoulder, not elsewhere classified: Secondary | ICD-10-CM

## 2018-06-24 DIAGNOSIS — Z9889 Other specified postprocedural states: Secondary | ICD-10-CM | POA: Diagnosis not present

## 2018-06-24 DIAGNOSIS — M533 Sacrococcygeal disorders, not elsewhere classified: Secondary | ICD-10-CM

## 2018-06-24 DIAGNOSIS — M6281 Muscle weakness (generalized): Secondary | ICD-10-CM

## 2018-06-24 DIAGNOSIS — M25512 Pain in left shoulder: Secondary | ICD-10-CM

## 2018-06-24 NOTE — Therapy (Signed)
[place] [facility] [address] [place], [place], [address] Phone: [phone]   Fax:  [phone]  Physical Therapy Treatment  Patient Details  Name: Curtis Singh MRN: [id number] Date of Birth: 03-18-1955 Referring Provider (PT): Dr. Lynann Bologna   Encounter Date: 06/22/2018  PT End of Session - 06/24/18 1235    Visit Number  15    Number of Visits  20    Date for PT Re-Evaluation  07/13/18    PT Start Time  1536    PT Stop Time  1611    PT Time Calculation (min)  35 min    Activity Tolerance  Patient tolerated treatment well    Behavior During Therapy  [facility] for tasks assessed/performed       Past Medical History:  Diagnosis Date  . Diabetes ([place])    type 2 x 14 years  . ED (erectile dysfunction)   . GERD (gastroesophageal reflux disease)   . 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 Hypertension   . Seasonal allergies     Past Surgical History:  Procedure Laterality Date  . ARTHOSCOPIC ROTAOR CUFF REPAIR Left 02/25/2018   Procedure: LEFT ARTHROSCOPIC ROTATOR CUFF REPAIR; SUBACROMIAL DECOMPRESSION;  Surgeon: Tania Ade, MD;  [facility]: [facility];  Service: Orthopedics;  Laterality: Left;  . CATARACT EXTRACTION W/PHACO Right 09/03/2016   Procedure: CATARACT EXTRACTION PHACO AND INTRAOCULAR LENS PLACEMENT ([place]) RIGHT DIABETIC;  Surgeon: Leandrew Koyanagi, MD;  Location: [place];  Service: Ophthalmology;  Laterality: Right;  DIABETIC  . ROTATOR CUFF REPAIR Right 2009    There were no vitals filed for this visit.  Subjective Assessment - 06/24/18 1231    Subjective  Pt. states he had a good trip to [place] on his motorcycle.  Pt. noticed L shoulder issues after handling motorcycle at stops/ starts.  Increase L shoulder soreness noted.    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 As soon as he put the boxes down, he felt pain his R low back which has spread across the whole low back, but R>L. Patient is currently not picking up anything >20lbs and is to avoid bending, twisting, lifting.  Pt. was being treated for LBP up until L RTC repair surgery on 02/25/2018.  Pt. is hoping to return to work on 03/22/2018 with limited duty secondary to shoulder limitations/ protocol.       Limitations  Lifting;Standing;Walking;House hold activities;Sitting    Diagnostic tests  (-) MRI    Patient Stated Goals  Pain-free movement with work-related tasks/ riding motorcycle    Currently in Pain?  Yes    Pain Score  2     Pain Location  Shoulder    Pain Orientation  Left    Pain Descriptors / Indicators  Aching    Pain Onset  More than a month ago          TREATMENT  Therapeutic Exercise: Seated B UBE 2 min. F/b (warm-up)- no pain Standing ER with RTB 20x.   SupineL shoulderrhythmic stabs 30s/bout x 3boutswithmoderate manual resistance SupineLserratus punch 5#x20.  Seated 5# bicep curls/ tricep extension 20x. Nautilus: 50# lat. Pull downs/40#tricepsextension/ 40# shoulder extension and flexion with handles/ 40# sh. Adduction/ 30# diagonals20x each.  50# chest press with wand20x. Prone shoulder  flexion/ scap. Retraction/ extension 20x each (no wt.).    Patient educated throughout session on appropriate technique and form using multi-modal cueing, HEP, and activity modification. Patient articulated understanding and returned demonstration.  Patient Response to interventions: Patient denied any increase in pain from the exercises.   PT Short Term Goals - 06/08/18 0734      PT SHORT TERM GOAL #1   Title  Pt. will increase FOTO to 65 to improve pain-free mobility with lifting/ work-related tasks.    Baseline  initial FOTO on 06/01/2018:  59    Time  4    Period  Weeks    Status  New    Target Date   06/29/18      PT SHORT TERM GOAL #2   Title  Pt. able to demonstrate proper lifting technique of 40# box with no c/o R low back pain to improve work-related tasks.     Baseline  lifting restriction/ lifting upright posture with floor to waist lifting (pt. also restricted by L shoulder)    Time  4    Period  Weeks    Status  New    Target Date  06/29/18      PT SHORT TERM GOAL #3   Title  Pt. will report no R SI/lumbar tenderness with palpation to improve pain-free mobility.      Baseline  (+) R SI tenderness.  Pt. reports no pain at rest in sitting/standing posture.     Time  4    Period  Weeks    Status  New    Target Date  06/29/18      PT SHORT TERM GOAL #4   Title  Pt. able to ride motorcycle with no c/o R low back pain.      Baseline  Pt. planning on riding to [place] but concerned about low back pain.    Time  4    Period  Weeks    Status  New    Target Date  06/29/18        PT Long Term Goals - 06/15/18 1640      PT LONG TERM GOAL #1   Title  Pt. will increase FOTO to 67 to improve L shoulder pain-free moblity with daily tasks.     Baseline  FOTO: initial: 36/ goal: 35; 05/17/18: Not performed    Time  4    Period  Weeks    Status  On-going    Target Date  07/13/18      PT LONG TERM GOAL #2   Title  Pt. will increase L shoulder AAROM to [facility] as compared to R shoulder to improve overhead reaching/ functional tasks.    Baseline  L shoulder AROM WFL as compared to R shoulder except ER 60 deg.    Time  4    Period  Weeks    Status  Partially Met    Target Date  07/13/18      PT LONG TERM GOAL #3   Title  Pt. will increase L shoulder strength to grossly 4/5 MMT (flexion/ abduction/ ER) to improve progression of functional/ work-related tasks.      Baseline  L shoulder strength grossly 4/5 MMT with no increase c/o pain.    Time  4    Period  Weeks    Status  Achieved    Target Date  07/13/18      PT LONG TERM GOAL #4   Title  Pt. will report <  3/10 L shoulder pain at  worst with progression of ROM ex.    Baseline  4-5/10 L shoulder pain at rest/ >9/10 at worst (no pain medications); 05/17/18: worst pain 4-5/10;    Time  4    Period  Weeks    Status  Partially Met    Target Date  07/13/18      PT LONG TERM GOAL #5   Title  Pt. will progress to independent gym based/ wt. training program to improve strength to grossly 5/5 MMT with no pain.    Baseline  See above    Time  4    Period  Weeks    Status  New    Target Date  07/13/18            Plan - 06/24/18 1235    Clinical Impression Statement  Pt. continues to progress with B shoulder/ UE resisted ther.ex.  PT added RTB L shoulder ER and modified positioning.  No increase c/o pain reported.  Pt. progressing well towards goals.    Stability/Clinical Decision Making  Stable/Uncomplicated    Clinical Decision Making  Moderate    Rehab Potential  Good    PT Frequency  2x / week    PT Duration  4 weeks    PT Treatment/Interventions  ADLs/Self Care Home Management;Cryotherapy;Electrical Stimulation;Functional mobility training;Therapeutic activities;Therapeutic exercise;Patient/family education;Neuromuscular re-education;Manual techniques;Scar mobilization;Passive range of motion    PT Next Visit Plan  Progress L shoulder strengthening.    PT Home Exercise Plan  See handouts       Patient will benefit from skilled therapeutic intervention in order to improve the following deficits and impairments:  Decreased mobility, Decreased strength, Impaired flexibility, Hypomobility, Pain, Impaired UE functional use  Visit Diagnosis: 1. S/P left rotator cuff repair   2. Shoulder joint stiffness, left   3. Muscle weakness (generalized)   4. Acute pain of left shoulder        Problem List Patient Active Problem List   Diagnosis Date Noted  . ED (erectile dysfunction) of organic origin 02/21/2015  . Diabetes ([place]) 10/18/2014  . Hyperlipemia 10/18/2014   Pura Spice, PT, DPT # [id number] 06/24/2018,  12:42 PM  [facility] [address] [place], [place], [address] Phone: [phone]   Fax:  [phone]  Name: Curtis Singh MRN: [id number] Date of Birth: 11-07-1955

## 2018-06-26 ENCOUNTER — Encounter: Payer: Self-pay | Admitting: Physical Therapy

## 2018-06-26 NOTE — Therapy (Signed)
[facility] [address] [place], [place], [address] Phone: [phone]   Fax:  [phone]  Physical Therapy Treatment  Patient Details  Name: Curtis Singh MRN: [id number] Date of Birth: [birth year] Referring Provider (PT): Dr. Lynann Bologna   Encounter Date: 06/24/2018    Past Medical History:  Diagnosis Date  . Diabetes ([place])    type 2 x 14 years  . ED (erectile dysfunction)   . GERD (gastroesophageal reflux disease)   . 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 Hypertension   . Seasonal allergies     Past Surgical History:  Procedure Laterality Date  . ARTHOSCOPIC ROTAOR CUFF REPAIR Left 02/25/2018   Procedure: LEFT ARTHROSCOPIC ROTATOR CUFF REPAIR; SUBACROMIAL DECOMPRESSION;  Surgeon: Tania Ade, MD;  [facility];  Service: Orthopedics;  Laterality: Left;  . CATARACT EXTRACTION W/PHACO Right 09/03/2016   Procedure: CATARACT EXTRACTION PHACO AND INTRAOCULAR LENS PLACEMENT ([place]) RIGHT DIABETIC;  Surgeon: Leandrew Koyanagi, MD;  Location: [place];  Service: Ophthalmology;  Laterality: Right;  DIABETIC  . ROTATOR CUFF REPAIR Right 2009    There were no vitals filed for this visit.  Subjective Assessment - 06/26/18 1030    Subjective  No tx. to low back today.  PT focused on shoulder ther.ex. today.  Pt. fatigued after long day/week of work.  No c/o back pain currently at rest but pt. still requires occasional use of home TENS.    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 As soon as he put the boxes down, he felt pain his R low back which has spread across the whole low back, but R>L. Patient is currently not picking up anything >20lbs and is to avoid bending, twisting, lifting.  Pt. was being treated for LBP up until L RTC  repair surgery on 02/25/2018.  Pt. is hoping to return to work on 03/22/2018 with limited duty secondary to shoulder limitations/ protocol.       Limitations  Lifting;Standing;Walking;House hold activities;Sitting    Diagnostic tests  (-) MRI    Patient Stated Goals  Pain-free movement with work-related tasks/ riding motorcycle    Currently in Pain?  No/denies                No back treatment.  See 06/24/2018 note on L shoulder PT treatment.       PT Short Term Goals - 06/08/18 0734      PT SHORT TERM GOAL #1   Title  Pt. will increase FOTO to 65 to improve pain-free mobility with lifting/ work-related tasks.    Baseline  initial FOTO on 06/01/2018:  59    Time  4    Period  Weeks    Status  New    Target Date  06/29/18      PT SHORT TERM GOAL #2   Title  Pt. able to demonstrate proper lifting technique of 40# box with no c/o R low back pain to improve work-related tasks.     Baseline  lifting restriction/ lifting upright posture with floor to waist lifting (pt. also restricted by L shoulder)    Time  4    Period  Weeks    Status  New    Target Date  06/29/18  PT SHORT TERM GOAL #3   Title  Pt. will report no R SI/lumbar tenderness with palpation to improve pain-free mobility.      Baseline  (+) R SI tenderness.  Pt. reports no pain at rest in sitting/standing posture.     Time  4    Period  Weeks    Status  New    Target Date  06/29/18      PT SHORT TERM GOAL #4   Title  Pt. able to ride motorcycle with no c/o R low back pain.      Baseline  Pt. planning on riding to [place] but concerned about low back pain.    Time  4    Period  Weeks    Status  New    Target Date  06/29/18        PT Long Term Goals - 06/15/18 1640      PT LONG TERM GOAL #1   Title  Pt. will increase FOTO to 67 to improve L shoulder pain-free moblity with daily tasks.     Baseline  FOTO: initial: 36/ goal: 63; 05/17/18: Not performed    Time  4    Period  Weeks    Status  On-going     Target Date  07/13/18      PT LONG TERM GOAL #2   Title  Pt. will increase L shoulder AAROM to [facility] as compared to R shoulder to improve overhead reaching/ functional tasks.    Baseline  L shoulder AROM WFL as compared to R shoulder except ER 60 deg.    Time  4    Period  Weeks    Status  Partially Met    Target Date  07/13/18      PT LONG TERM GOAL #3   Title  Pt. will increase L shoulder strength to grossly 4/5 MMT (flexion/ abduction/ ER) to improve progression of functional/ work-related tasks.      Baseline  L shoulder strength grossly 4/5 MMT with no increase c/o pain.    Time  4    Period  Weeks    Status  Achieved    Target Date  07/13/18      PT LONG TERM GOAL #4   Title  Pt. will report <3/10 L shoulder pain at worst with progression of ROM ex.    Baseline  4-5/10 L shoulder pain at rest/ >9/10 at worst (no pain medications); 05/17/18: worst pain 4-5/10;    Time  4    Period  Weeks    Status  Partially Met    Target Date  07/13/18      PT LONG TERM GOAL #5   Title  Pt. will progress to independent gym based/ wt. training program to improve strength to grossly 5/5 MMT with no pain.    Baseline  See above    Time  4    Period  Weeks    Status  New    Target Date  07/13/18            Plan - 06/26/18 1031    Clinical Impression Statement  No tx. to low back today.  See shoulder note.    Stability/Clinical Decision Making  Stable/Uncomplicated    Rehab Potential  Excellent    PT Frequency  2x / week    PT Duration  4 weeks    PT Treatment/Interventions  Aquatic Therapy;Cryotherapy;Electrical Stimulation;Iontophoresis 53m/ml Dexamethasone;Moist Heat;Functional mobility training;Stair training;Therapeutic activities;Therapeutic exercise;Balance training;Neuromuscular re-education;Patient/family  education;Manual techniques;Dry needling;Passive range of motion;Taping;Spinal Manipulations;Joint Manipulations    PT Next Visit Plan  Reassess goals for low back/ progress  core stability ex.    PT Home Exercise Plan  Access Code: [email or web address] (HLTR, supine piriformis stretch, single knee to chest). Page 1 and 2 transverse abdominus core progression.     Consulted and Agree with Plan of Care  Patient       Patient will benefit from skilled therapeutic intervention in order to improve the following deficits and impairments:  Pain, Improper body mechanics, Decreased mobility, Increased muscle spasms, Postural dysfunction, Difficulty walking, Decreased strength, Decreased range of motion, Decreased activity tolerance, Decreased endurance, Hypomobility  Visit Diagnosis: 1. Muscle weakness (generalized)   2. Sacroiliac joint dysfunction of right side        Problem List Patient Active Problem List   Diagnosis Date Noted  . ED (erectile dysfunction) of organic origin 02/21/2015  . Diabetes ([place]) 10/18/2014  . Hyperlipemia 10/18/2014   Pura Spice, PT, DPT # [phone] 06/26/2018, 10:32 AM  [facility] [address] [place], [place], [address] Phone: [phone]   Fax:  [phone]  Name: Curtis Singh MRN: [id number] Date of Birth: [birth year]

## 2018-06-26 NOTE — Therapy (Signed)
[facility] [address] [place], [place], [address] Phone: [phone]   Fax:  [phone]  Physical Therapy Treatment  Patient Details  Name: Curtis Singh MRN: [id number] Date of Birth: 19-Aug-1955 Referring Provider (PT): Dr. Lynann Bologna   Encounter Date: 06/24/2018  PT End of Session - 06/26/18 1035    Visit Number  16    Number of Visits  20    Date for PT Re-Evaluation  07/13/18    PT Start Time  1502    PT Stop Time  1551    PT Time Calculation (min)  49 min    Activity Tolerance  Patient tolerated treatment well    Behavior During Therapy  [facility] for tasks assessed/performed       Past Medical History:  Diagnosis Date  . Diabetes ([place])    type 2 x 14 years  . ED (erectile dysfunction)   . GERD (gastroesophageal reflux disease)   . 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 Hypertension   . Seasonal allergies     Past Surgical History:  Procedure Laterality Date  . ARTHOSCOPIC ROTAOR CUFF REPAIR Left 02/25/2018   Procedure: LEFT ARTHROSCOPIC ROTATOR CUFF REPAIR; SUBACROMIAL DECOMPRESSION;  Surgeon: Tania Ade, MD;  [facility];  Service: Orthopedics;  Laterality: Left;  . CATARACT EXTRACTION W/PHACO Right 09/03/2016   Procedure: CATARACT EXTRACTION PHACO AND INTRAOCULAR LENS PLACEMENT ([place]) RIGHT DIABETIC;  Surgeon: Leandrew Koyanagi, MD;  Location: [place];  Service: Ophthalmology;  Laterality: Right;  DIABETIC  . ROTATOR CUFF REPAIR Right 2009    There were no vitals filed for this visit.  Subjective Assessment - 06/26/18 1034    Subjective  Pt. states L shoulder is sore but not hurting at this moment.  Pt. states he was too busy at work today to think about hurting.  Pt. reports compliance with HEP.    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 As soon as he put the boxes down, he felt pain his R low back which has spread across the whole low back, but R>L. Patient is currently not picking up anything >20lbs and is to avoid bending, twisting, lifting.  Pt. was being treated for LBP up until L RTC repair surgery on 02/25/2018.  Pt. is hoping to return to work on 03/22/2018 with limited duty secondary to shoulder limitations/ protocol.       Limitations  Lifting;Standing;Walking;House hold activities;Sitting    Diagnostic tests  (-) MRI    Patient Stated Goals  Pain-free movement with work-related tasks/ riding motorcycle    Currently in Pain?  No/denies    Pain Onset  More than a month ago         Therapeutic Exercise:  Seated B UBE 2 min. F/b (warm-up)- no pain Bodyblade (carying planes of movement with mirror feedback)- 30 sec. each   SupineL shoulderrhythmic stabs 30s/bout x 3boutswithmoderate manual resistance SupineLserratus punch 5#x20.  Seated 5# bicep curls/ tricep extension 20x. Nautilus: 50# lat. Pull downs/40#tricepsextension/ 40# shoulder extension and flexion with handles/ 40# sh. Adduction/ 30# diagonals20x each. Prone shoulder flexion/ scap. Retraction/ extension 20x each (no wt.).    Patient educated throughout session on appropriate technique and form using multi-modal cueing, HEP, and activity modification. Patient articulated understanding and  returned demonstration.  Patient Response to interventions: Patient denied any increase in pain from the exercises.   PT Short Term Goals - 06/08/18 0734      PT SHORT TERM GOAL #1   Title  Pt. will increase FOTO to 65 to improve pain-free mobility with lifting/ work-related tasks.    Baseline  initial FOTO on 06/01/2018:  59    Time  4    Period  Weeks    Status  New    Target Date  06/29/18      PT SHORT TERM GOAL #2   Title  Pt. able to demonstrate proper lifting technique of 40# box with no  c/o R low back pain to improve work-related tasks.     Baseline  lifting restriction/ lifting upright posture with floor to waist lifting (pt. also restricted by L shoulder)    Time  4    Period  Weeks    Status  New    Target Date  06/29/18      PT SHORT TERM GOAL #3   Title  Pt. will report no R SI/lumbar tenderness with palpation to improve pain-free mobility.      Baseline  (+) R SI tenderness.  Pt. reports no pain at rest in sitting/standing posture.     Time  4    Period  Weeks    Status  New    Target Date  06/29/18      PT SHORT TERM GOAL #4   Title  Pt. able to ride motorcycle with no c/o R low back pain.      Baseline  Pt. planning on riding to [place] but concerned about low back pain.    Time  4    Period  Weeks    Status  New    Target Date  06/29/18        PT Long Term Goals - 06/15/18 1640      PT LONG TERM GOAL #1   Title  Pt. will increase FOTO to 67 to improve L shoulder pain-free moblity with daily tasks.     Baseline  FOTO: initial: 36/ goal: 34; 05/17/18: Not performed    Time  4    Period  Weeks    Status  On-going    Target Date  07/13/18      PT LONG TERM GOAL #2   Title  Pt. will increase L shoulder AAROM to [facility] as compared to R shoulder to improve overhead reaching/ functional tasks.    Baseline  L shoulder AROM WFL as compared to R shoulder except ER 60 deg.    Time  4    Period  Weeks    Status  Partially Met    Target Date  07/13/18      PT LONG TERM GOAL #3   Title  Pt. will increase L shoulder strength to grossly 4/5 MMT (flexion/ abduction/ ER) to improve progression of functional/ work-related tasks.      Baseline  L shoulder strength grossly 4/5 MMT with no increase c/o pain.    Time  4    Period  Weeks    Status  Achieved    Target Date  07/13/18      PT LONG TERM GOAL #4   Title  Pt. will report <3/10 L shoulder pain at worst with progression of ROM ex.    Baseline  4-5/10 L shoulder pain at rest/ >9/10 at worst (no pain  medications); 05/17/18: worst  pain 4-5/10;    Time  4    Period  Weeks    Status  Partially Met    Target Date  07/13/18      PT LONG TERM GOAL #5   Title  Pt. will progress to independent gym based/ wt. training program to improve strength to grossly 5/5 MMT with no pain.    Baseline  See above    Time  4    Period  Weeks    Status  New    Target Date  07/13/18            Plan - 06/26/18 1036    Clinical Impression Statement  Tx. focus on L shoulder/ scapular stability today with proper upright posture and occasional cuing to activate core musculature.  Pt. demonstrates marked improvement/ control during use of bodyblade (varying positions).  Pt. progressing well but remains limited by joint stiffness/ pain during ER.    Stability/Clinical Decision Making  Stable/Uncomplicated    Clinical Decision Making  Moderate    Rehab Potential  Good    PT Frequency  2x / week    PT Duration  4 weeks    PT Treatment/Interventions  ADLs/Self Care Home Management;Cryotherapy;Electrical Stimulation;Functional mobility training;Therapeutic activities;Therapeutic exercise;Patient/family education;Neuromuscular re-education;Manual techniques;Scar mobilization;Passive range of motion    PT Next Visit Plan  Progress L shoulder strengthening.    PT Home Exercise Plan  See handouts       Patient will benefit from skilled therapeutic intervention in order to improve the following deficits and impairments:  Decreased mobility, Decreased strength, Impaired flexibility, Hypomobility, Pain, Impaired UE functional use  Visit Diagnosis: 1. Muscle weakness (generalized)   2. S/P left rotator cuff repair   3. Shoulder joint stiffness, left   4. Acute pain of left shoulder        Problem List Patient Active Problem List   Diagnosis Date Noted  . ED (erectile dysfunction) of organic origin 02/21/2015  . Diabetes ([place]) 10/18/2014  . Hyperlipemia 10/18/2014   Pura Spice, PT, DPT #  [phone] 06/26/2018, 10:38 AM  [place] [facility] [address] [place], [place], [address] Phone: [phone]   Fax:  [phone]  Name: FARHAN JEAN MRN: [id number] Date of Birth: 22-Nov-1955

## 2018-06-29 ENCOUNTER — Ambulatory Visit: Payer: No Typology Code available for payment source | Admitting: Physical Therapy

## 2018-06-29 ENCOUNTER — Ambulatory Visit: Payer: PRIVATE HEALTH INSURANCE | Admitting: Physical Therapy

## 2018-06-29 ENCOUNTER — Other Ambulatory Visit: Payer: Self-pay

## 2018-06-29 DIAGNOSIS — M6281 Muscle weakness (generalized): Secondary | ICD-10-CM

## 2018-06-29 DIAGNOSIS — M533 Sacrococcygeal disorders, not elsewhere classified: Secondary | ICD-10-CM | POA: Diagnosis not present

## 2018-06-29 DIAGNOSIS — M25612 Stiffness of left shoulder, not elsewhere classified: Secondary | ICD-10-CM

## 2018-06-29 DIAGNOSIS — Z9889 Other specified postprocedural states: Secondary | ICD-10-CM

## 2018-06-30 NOTE — Therapy (Signed)
[facility] [address] [place], [place], [address] Phone: [phone]   Fax:  [phone]  Physical Therapy Treatment  Patient Details  Name: Curtis Singh MRN: [id number] Date of Birth: 05/02/1955 Referring Provider (PT): Dr. Lynann Bologna   Encounter Date: 06/29/2018  PT End of Session - 06/30/18 2032    Visit Number  7    Number of Visits  8    Date for PT Re-Evaluation  06/29/18    PT Start Time  1502    PT Stop Time  1536    PT Time Calculation (min)  34 min    Activity Tolerance  Patient tolerated treatment well    Behavior During Therapy  [facility] for tasks assessed/performed       Past Medical History:  Diagnosis Date  . Diabetes ([place])    type 2 x 14 years  . ED (erectile dysfunction)   . GERD (gastroesophageal reflux disease)   . 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 Hypertension   . Seasonal allergies     Past Surgical History:  Procedure Laterality Date  . ARTHOSCOPIC ROTAOR CUFF REPAIR Left 02/25/2018   Procedure: LEFT ARTHROSCOPIC ROTATOR CUFF REPAIR; SUBACROMIAL DECOMPRESSION;  Surgeon: Tania Ade, MD;  [facility];  Service: Orthopedics;  Laterality: Left;  . CATARACT EXTRACTION W/PHACO Right 09/03/2016   Procedure: CATARACT EXTRACTION PHACO AND INTRAOCULAR LENS PLACEMENT ([place]) RIGHT DIABETIC;  Surgeon: Leandrew Koyanagi, MD;  Location: [place];  Service: Ophthalmology;  Laterality: Right;  DIABETIC  . ROTATOR CUFF REPAIR Right 2009    There were no vitals filed for this visit.  Subjective Assessment - 06/30/18 2026    Subjective  Pt. reports no low back pain at this time.  Pt. states he still has episodes of increase low back pain which requires use of TENS unit.  Pt. states overall that his back is doing much better but he expects to have some discomfort with increase activity.    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 As soon as he put the boxes down, he felt pain his R low back which has spread across the whole low back, but R>L. Patient is currently not picking up anything >20lbs and is to avoid bending, twisting, lifting.  Pt. was being treated for LBP up until L RTC repair surgery on 02/25/2018.  Pt. is hoping to return to work on 03/22/2018 with limited duty secondary to shoulder limitations/ protocol.       Limitations  Lifting;Standing;Walking;House hold activities;Sitting    Diagnostic tests  (-) MRI    Patient Stated Goals  Pain-free movement with work-related tasks/ riding motorcycle    Currently in Pain?  No/denies       Manual tx.:  Supine hamstring/ piriformis/ ITB/ hip stretches (15 min.) Prone press-ups with manual overpressure/ sacral positioning Prone grade II-III PA mobs. To L1-L5 region 2x20 sec. (central/ unilateral) STM to R lumbar/ superior gluteal musculature. (+) R SI discomfort noted.     PT Short Term Goals - 06/30/18 2040      PT SHORT TERM GOAL #1   Title  Pt. will increase FOTO to 65 to improve pain-free mobility with lifting/ work-related tasks.    Baseline  initial FOTO on 06/01/2018:  59.  6/23: 72.    Time  4    Period  Weeks    Status  Achieved    Target Date  06/29/18      PT SHORT TERM GOAL #2   Title  Pt. able to demonstrate proper lifting technique of 40# box with no c/o R low back pain to improve work-related tasks.     Baseline  Good mechanics with UE lifting ex./ box carrying/ sled push and pull.    Time  4    Period  Weeks    Status  Achieved    Target Date  06/29/18      PT SHORT TERM GOAL #3   Title  Pt. will report no R SI/lumbar tenderness with palpation to improve pain-free mobility.      Baseline  (+) R SI tenderness.  Pt. reports no pain at rest in sitting/standing posture.     Time  4    Period  Weeks    Status   Partially Met    Target Date  06/29/18      PT SHORT TERM GOAL #4   Title  Pt. able to ride motorcycle with no c/o R low back pain.      Baseline  Pt. able to ride bike with no limitations.  Pt. did report slight discomfort in low back with handling bike at stops.    Time  4    Period  Weeks    Status  Achieved    Target Date  06/29/18        PT Long Term Goals - 06/15/18 1640      PT LONG TERM GOAL #1   Title  Pt. will increase FOTO to 67 to improve L shoulder pain-free moblity with daily tasks.     Baseline  FOTO: initial: 36/ goal: 24; 05/17/18: Not performed    Time  4    Period  Weeks    Status  On-going    Target Date  07/13/18      PT LONG TERM GOAL #2   Title  Pt. will increase L shoulder AAROM to [facility] as compared to R shoulder to improve overhead reaching/ functional tasks.    Baseline  L shoulder AROM WFL as compared to R shoulder except ER 60 deg.    Time  4    Period  Weeks    Status  Partially Met    Target Date  07/13/18      PT LONG TERM GOAL #3   Title  Pt. will increase L shoulder strength to grossly 4/5 MMT (flexion/ abduction/ ER) to improve progression of functional/ work-related tasks.      Baseline  L shoulder strength grossly 4/5 MMT with no increase c/o pain.    Time  4    Period  Weeks    Status  Achieved    Target Date  07/13/18      PT LONG TERM GOAL #4   Title  Pt. will report <3/10 L shoulder pain at worst with progression of ROM ex.    Baseline  4-5/10 L shoulder pain at rest/ >9/10 at worst (no pain medications); 05/17/18: worst pain 4-5/10;    Time  4    Period  Weeks    Status  Partially Met    Target Date  07/13/18      PT LONG TERM GOAL #5   Title  Pt. will progress to independent gym based/ wt. training program to improve strength to  grossly 5/5 MMT with no pain.    Baseline  See above    Time  4    Period  Weeks    Status  New    Target Date  07/13/18          Plan - 06/30/18 2033    Clinical Impression Statement  FOTO:  initial 56/ today 72/ goal 28.  Goals met for lumbar spine.  No lumbar paraspinal muscle tenderness noted during manual tx./ STM in prone position.  R SI discomfort reported but minimal with deep pressure.  Full lumbar AROM (all planes) in standing posture.  Pt. understands current progressive HEP and will be discharged from PT at this time.    Stability/Clinical Decision Making  Stable/Uncomplicated    Rehab Potential  Excellent    PT Frequency  2x / week    PT Duration  4 weeks    PT Treatment/Interventions  Aquatic Therapy;Cryotherapy;Electrical Stimulation;Iontophoresis 20m/ml Dexamethasone;Moist Heat;Functional mobility training;Stair training;Therapeutic activities;Therapeutic exercise;Balance training;Neuromuscular re-education;Patient/family education;Manual techniques;Dry needling;Passive range of motion;Taping;Spinal Manipulations;Joint Manipulations    PT Next Visit Plan  Discharge    PT Home Exercise Plan  Access Code: [email or web address](HLTR, supine piriformis stretch, single knee to chest). Page 1 and 2 transverse abdominus core progression.     Consulted and Agree with Plan of Care  Patient       Patient will benefit from skilled therapeutic intervention in order to improve the following deficits and impairments:  Pain, Improper body mechanics, Decreased mobility, Increased muscle spasms, Postural dysfunction, Difficulty walking, Decreased strength, Decreased range of motion, Decreased activity tolerance, Decreased endurance, Hypomobility  Visit Diagnosis: 1. Sacroiliac joint dysfunction of right side   2. Muscle weakness (generalized)        Problem List Patient Active Problem List   Diagnosis Date Noted  . ED (erectile dysfunction) of organic origin 02/21/2015  . Diabetes ([place] 10/18/2014  . Hyperlipemia 10/18/2014   MPura Spice PT, DPT # [id number] 10236/24/2020, 8:49 PM  [place] [facility]MM[place] [address]Phone:  [phone]  Fax:  [phone] Name: Curtis BUSBEEMRN: [id number] of Birth: 1May 28, 1957

## 2018-06-30 NOTE — Therapy (Signed)
[facility] [address] [place], [place], [address] Phone: [phone]   Fax:  [phone]  Physical Therapy Treatment  Patient Details  Name: Curtis Singh MRN: [id number] Date of Birth: 1955/03/23 Referring Provider (PT): Dr. Lynann Bologna   Encounter Date: 06/29/2018  PT End of Session - 06/30/18 2057    Visit Number  17    Number of Visits  20    Date for PT Re-Evaluation  07/13/18    PT Start Time  1536    PT Stop Time  1603    PT Time Calculation (min)  27 min    Activity Tolerance  Patient tolerated treatment well    Behavior During Therapy  [facility] for tasks assessed/performed       Past Medical History:  Diagnosis Date  . Diabetes ([place])    type 2 x 14 years  . ED (erectile dysfunction)   . GERD (gastroesophageal reflux disease)   . 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 Hypertension   . Seasonal allergies     Past Surgical History:  Procedure Laterality Date  . ARTHOSCOPIC ROTAOR CUFF REPAIR Left 02/25/2018   Procedure: LEFT ARTHROSCOPIC ROTATOR CUFF REPAIR; SUBACROMIAL DECOMPRESSION;  Surgeon: Tania Ade, MD;  [facility];  Service: Orthopedics;  Laterality: Left;  . CATARACT EXTRACTION W/PHACO Right 09/03/2016   Procedure: CATARACT EXTRACTION PHACO AND INTRAOCULAR LENS PLACEMENT ([place]) RIGHT DIABETIC;  Surgeon: Leandrew Koyanagi, MD;  Location: [place];  Service: Ophthalmology;  Laterality: Right;  DIABETIC  . ROTATOR CUFF REPAIR Right 2009    There were no vitals filed for this visit.  Subjective Assessment - 06/30/18 2054    Subjective  MD appt. for L shoulder on 07/13/2018.  Pt. reports no L shoulder pain at this time.  Pt. was discharge from PT for low back pain today (see note).    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 As soon as he put the boxes down, he felt pain his R low back which has spread across the whole low back, but R>L. Patient is currently not picking up anything >20lbs and is to avoid bending, twisting, lifting.  Pt. was being treated for LBP up until L RTC repair surgery on 02/25/2018.  Pt. is hoping to return to work on 03/22/2018 with limited duty secondary to shoulder limitations/ protocol.       Limitations  Lifting;Standing;Walking;House hold activities;Sitting    Diagnostic tests  (-) MRI    Patient Stated Goals  Pain-free movement with work-related tasks/ riding motorcycle    Currently in Pain?  No/denies    Pain Onset  More than a month ago       Therapeutic Exercise:  Seated B UBE 2 min. F/b (warm-up)- no pain SupineLserratus punch 5#/ sh. Adduction/ tricep extension/ press-ups 20x each. Seated 5# bicep curls/ press-ups 20x. Nautilus:50# lat. Pull downs/40# shoulder extension/ 40# sh. Adduction/ 50# scap. retraction30x each. Rebounder with L UE  Patient educated throughout session on appropriate technique and form using multi-modal cueing, HEP, and activity modification. Patient articulated understanding and returned demonstration.   PT Short Term Goals - 06/30/18 2040      PT SHORT TERM GOAL #1   Title  Pt. will increase FOTO to 65 to improve  pain-free mobility with lifting/ work-related tasks.    Baseline  initial FOTO on 06/01/2018:  59.   6/23: 72.    Time  4    Period  Weeks    Status  Achieved    Target Date  06/29/18      PT SHORT TERM GOAL #2   Title  Pt. able to demonstrate proper lifting technique of 40# box with no c/o R low back pain to improve work-related tasks.     Baseline  Good mechanics with UE lifting ex./ box carrying/ sled push and pull.    Time  4    Period  Weeks    Status  Achieved    Target Date  06/29/18      PT SHORT TERM GOAL #3   Title  Pt. will report no R SI/lumbar tenderness with palpation to  improve pain-free mobility.      Baseline  (+) R SI tenderness.  Pt. reports no pain at rest in sitting/standing posture.     Time  4    Period  Weeks    Status  Partially Met    Target Date  06/29/18      PT SHORT TERM GOAL #4   Title  Pt. able to ride motorcycle with no c/o R low back pain.      Baseline  Pt. able to ride bike with no limitations.  Pt. did report slight discomfort in low back with handling bike at stops.    Time  4    Period  Weeks    Status  Achieved    Target Date  06/29/18        PT Long Term Goals - 06/15/18 1640      PT LONG TERM GOAL #1   Title  Pt. will increase FOTO to 67 to improve L shoulder pain-free moblity with daily tasks.     Baseline  FOTO: initial: 36/ goal: 43; 05/17/18: Not performed    Time  4    Period  Weeks    Status  On-going    Target Date  07/13/18      PT LONG TERM GOAL #2   Title  Pt. will increase L shoulder AAROM to [facility] as compared to R shoulder to improve overhead reaching/ functional tasks.    Baseline  L shoulder AROM WFL as compared to R shoulder except ER 60 deg.    Time  4    Period  Weeks    Status  Partially Met    Target Date  07/13/18      PT LONG TERM GOAL #3   Title  Pt. will increase L shoulder strength to grossly 4/5 MMT (flexion/ abduction/ ER) to improve progression of functional/ work-related tasks.      Baseline  L shoulder strength grossly 4/5 MMT with no increase c/o pain.    Time  4    Period  Weeks    Status  Achieved    Target Date  07/13/18      PT LONG TERM GOAL #4   Title  Pt. will report <3/10 L shoulder pain at worst with progression of ROM ex.    Baseline  4-5/10 L shoulder pain at rest/ >9/10 at worst (no pain medications); 05/17/18: worst pain 4-5/10;    Time  4    Period  Weeks    Status  Partially Met    Target Date  07/13/18      PT LONG TERM GOAL #  5   Title  Pt. will progress to independent gym based/ wt. training program to improve strength to grossly 5/5 MMT with no pain.     Baseline  See above    Time  4    Period  Weeks    Status  New    Target Date  07/13/18            Plan - 06/30/18 2058    Clinical Impression Statement  Pt. able to increase resistance with all UE ex. on Nautilus/ dumbbells during PT tx. today.  Moderate L shoulder/ biceps muscle fatigue noted with minimal c/o L shoulder pain.  L shoulder ER remains limited but pt. able to throw on rebounder with good form/ technique.    Stability/Clinical Decision Making  Stable/Uncomplicated    Clinical Decision Making  Low    Rehab Potential  Good    PT Frequency  2x / week    PT Duration  4 weeks    PT Treatment/Interventions  ADLs/Self Care Home Management;Cryotherapy;Electrical Stimulation;Functional mobility training;Therapeutic activities;Therapeutic exercise;Patient/family education;Neuromuscular re-education;Manual techniques;Scar mobilization;Passive range of motion    PT Next Visit Plan  Progress L shoulder strengthening.  MD appt. 7/7    PT Home Exercise Plan  See handouts       Patient will benefit from skilled therapeutic intervention in order to improve the following deficits and impairments:  Decreased mobility, Decreased strength, Impaired flexibility, Hypomobility, Pain, Impaired UE functional use  Visit Diagnosis: 1. S/P left rotator cuff repair   2. Muscle weakness (generalized)   3. Shoulder joint stiffness, left        Problem List Patient Active Problem List   Diagnosis Date Noted  . ED (erectile dysfunction) of organic origin 02/21/2015  . Diabetes ([place]) 10/18/2014  . Hyperlipemia 10/18/2014   Pura Spice, PT, DPT # [id number] 06/30/2018, 9:11 PM  [place] [facility] [address] [place], [place], [address] Phone: [phone]   Fax:  [phone]  Name: Curtis Singh MRN: [id number] Date of Birth: [birth year]

## 2018-07-01 ENCOUNTER — Other Ambulatory Visit: Payer: Self-pay

## 2018-07-01 ENCOUNTER — Encounter: Payer: Self-pay | Admitting: Physical Therapy

## 2018-07-01 ENCOUNTER — Ambulatory Visit: Payer: No Typology Code available for payment source | Admitting: Physical Therapy

## 2018-07-01 DIAGNOSIS — Z9889 Other specified postprocedural states: Secondary | ICD-10-CM

## 2018-07-01 DIAGNOSIS — M25612 Stiffness of left shoulder, not elsewhere classified: Secondary | ICD-10-CM

## 2018-07-01 DIAGNOSIS — M6281 Muscle weakness (generalized): Secondary | ICD-10-CM

## 2018-07-01 NOTE — Therapy (Signed)
[place] [facility] [address] [place], [place], [address] Phone: [phone]   Fax:  [phone]  Physical Therapy Treatment  Patient Details  Name: Curtis Singh MRN: [id number] Date of Birth: 1955/07/01 Referring Provider (PT): Dr. Lynann Bologna   Encounter Date: 07/01/2018  PT End of Session - 07/01/18 1600    Visit Number  18    Number of Visits  20    Date for PT Re-Evaluation  07/13/18    PT Start Time  1502    PT Stop Time  1540    PT Time Calculation (min)  38 min    Activity Tolerance  Patient tolerated treatment well;Patient limited by pain    Behavior During Therapy  [facility] for tasks assessed/performed       Past Medical History:  Diagnosis Date  . Diabetes ([place])    type 2 x 14 years  . ED (erectile dysfunction)   . GERD (gastroesophageal reflux disease)   . 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 Hypertension   . Seasonal allergies     Past Surgical History:  Procedure Laterality Date  . ARTHOSCOPIC ROTAOR CUFF REPAIR Left 02/25/2018   Procedure: LEFT ARTHROSCOPIC ROTATOR CUFF REPAIR; SUBACROMIAL DECOMPRESSION;  Surgeon: Tania Ade, MD;  [facility];  Service: Orthopedics;  Laterality: Left;  . CATARACT EXTRACTION W/PHACO Right 09/03/2016   Procedure: CATARACT EXTRACTION PHACO AND INTRAOCULAR LENS PLACEMENT ([place]) RIGHT DIABETIC;  Surgeon: Leandrew Koyanagi, MD;  Location: [place];  Service: Ophthalmology;  Laterality: Right;  DIABETIC  . ROTATOR CUFF REPAIR Right 2009    There were no vitals filed for this visit.  Subjective Assessment - 07/01/18 1552    Subjective  Pt. states he had a fall yesterday and landed on L shoulder with elbow extended.  Pt. reports 4/10 L shoulder pain and generalized muscle soreness.  No bruising and minimal tenderness noted.    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 As soon as he put the boxes down, he felt pain his R low back which has spread across the whole low back, but R>L. Patient is currently not picking up anything >20lbs and is to avoid bending, twisting, lifting.  Pt. was being treated for LBP up until L RTC repair surgery on 02/25/2018.  Pt. is hoping to return to work on 03/22/2018 with limited duty secondary to shoulder limitations/ protocol.       Limitations  Lifting;Standing;Walking;House hold activities;Sitting    Diagnostic tests  (-) MRI    Patient Stated Goals  Pain-free movement with work-related tasks/ riding motorcycle    Currently in Pain?  Yes    Pain Score  4     Pain Location  Shoulder    Pain Orientation  Left    Pain Descriptors / Indicators  Aching;Sore    Pain Type  Chronic pain    Pain Onset  More than a month ago         Manual tx.:  Supine L shoulder AA/PROM all planes (as tolerated in pain-free range)- limited ER and abduction Supine grade II-III AP/PA/inf. Mobs. To L shoulder 3x20 sec. STM to L posterior deltoid/ proximal biceps/ UT region and pec. Minor during shoulder AAROM Reviewed HEP/ positioning of L shoulder  Ice to  L shoulder in seated position after tx. [place].    PT Short Term Goals - 06/30/18 2040      PT SHORT TERM GOAL #1   Title  Pt. will increase FOTO to 65 to improve pain-free mobility with lifting/ work-related tasks.    Baseline  initial FOTO on 06/01/2018:  59.   6/23: 72.    Time  4    Period  Weeks    Status  Achieved    Target Date  06/29/18      PT SHORT TERM GOAL #2   Title  Pt. able to demonstrate proper lifting technique of 40# box with no c/o R low back pain to improve work-related tasks.     Baseline  Good mechanics with UE lifting ex./ box carrying/ sled push and pull.    Time  4    Period  Weeks    Status  Achieved    Target Date  06/29/18      PT SHORT TERM GOAL #3   Title   Pt. will report no R SI/lumbar tenderness with palpation to improve pain-free mobility.      Baseline  (+) R SI tenderness.  Pt. reports no pain at rest in sitting/standing posture.     Time  4    Period  Weeks    Status  Partially Met    Target Date  06/29/18      PT SHORT TERM GOAL #4   Title  Pt. able to ride motorcycle with no c/o R low back pain.      Baseline  Pt. able to ride bike with no limitations.  Pt. did report slight discomfort in low back with handling bike at stops.    Time  4    Period  Weeks    Status  Achieved    Target Date  06/29/18        PT Long Term Goals - 06/15/18 1640      PT LONG TERM GOAL #1   Title  Pt. will increase FOTO to 67 to improve L shoulder pain-free moblity with daily tasks.     Baseline  FOTO: initial: 36/ goal: 26; 05/17/18: Not performed    Time  4    Period  Weeks    Status  On-going    Target Date  07/13/18      PT LONG TERM GOAL #2   Title  Pt. will increase L shoulder AAROM to [facility] as compared to R shoulder to improve overhead reaching/ functional tasks.    Baseline  L shoulder AROM WFL as compared to R shoulder except ER 60 deg.    Time  4    Period  Weeks    Status  Partially Met    Target Date  07/13/18      PT LONG TERM GOAL #3   Title  Pt. will increase L shoulder strength to grossly 4/5 MMT (flexion/ abduction/ ER) to improve progression of functional/ work-related tasks.      Baseline  L shoulder strength grossly 4/5 MMT with no increase c/o pain.    Time  4    Period  Weeks    Status  Achieved    Target Date  07/13/18      PT LONG TERM GOAL #4   Title  Pt. will report <3/10 L shoulder pain at worst with progression of ROM ex.    Baseline  4-5/10 L shoulder pain at rest/ >9/10 at worst (no pain  medications); 05/17/18: worst pain 4-5/10;    Time  4    Period  Weeks    Status  Partially Met    Target Date  07/13/18      PT LONG TERM GOAL #5   Title  Pt. will progress to independent gym based/ wt. training program to  improve strength to grossly 5/5 MMT with no pain.    Baseline  See above    Time  4    Period  Weeks    Status  New    Target Date  07/13/18            Plan - 07/01/18 1601    Clinical Impression Statement  No strength training today due to increase L shoulder pain/ soreness after fall yesterday.  No signs of rotator cuff or impingement issues in L shoulder.  Pt. pain limited with shoulder abduction >100 deg. and ER >32 deg. in supine position.  L posterior deltoid tenderness present with deep palpation.  Pt. instructed in benefits of ice or even use of TENS this weekend to manage pain symptoms.  Pt. will return to strength training this Saturday and PT will reassess ROM/ MMT next week.  L grip strength: 76.5#.    Stability/Clinical Decision Making  Stable/Uncomplicated    Clinical Decision Making  Low    Rehab Potential  Good    PT Frequency  2x / week    PT Duration  4 weeks    PT Treatment/Interventions  ADLs/Self Care Home Management;Cryotherapy;Electrical Stimulation;Functional mobility training;Therapeutic activities;Therapeutic exercise;Patient/family education;Neuromuscular re-education;Manual techniques;Scar mobilization;Passive range of motion    PT Next Visit Plan  Progress L shoulder strengthening.  MD appt. 7/7    PT Home Exercise Plan  See handouts       Patient will benefit from skilled therapeutic intervention in order to improve the following deficits and impairments:  Decreased mobility, Decreased strength, Impaired flexibility, Hypomobility, Pain, Impaired UE functional use  Visit Diagnosis: 1. S/P left rotator cuff repair   2. Muscle weakness (generalized)   3. Shoulder joint stiffness, left        Problem List Patient Active Problem List   Diagnosis Date Noted  . ED (erectile dysfunction) of organic origin 02/21/2015  . Diabetes ([place]) 10/18/2014  . Hyperlipemia 10/18/2014   Pura Spice, PT, DPT # [id number] 07/01/2018, 4:05 PM  [place] [facility] [address] [place], [place], [address] Phone: [phone]   Fax:  [phone]  Name: Curtis Singh MRN: [id number] Date of Birth: 1955/01/29

## 2018-07-06 ENCOUNTER — Other Ambulatory Visit: Payer: Self-pay

## 2018-07-06 ENCOUNTER — Ambulatory Visit: Payer: No Typology Code available for payment source | Admitting: Physical Therapy

## 2018-07-06 DIAGNOSIS — Z9889 Other specified postprocedural states: Secondary | ICD-10-CM | POA: Diagnosis not present

## 2018-07-06 DIAGNOSIS — M6281 Muscle weakness (generalized): Secondary | ICD-10-CM

## 2018-07-06 DIAGNOSIS — M25612 Stiffness of left shoulder, not elsewhere classified: Secondary | ICD-10-CM

## 2018-07-06 NOTE — Therapy (Addendum)
[facility] [address] [place], [place], [address] Phone: [phone]   Fax:  [phone]  Physical Therapy Treatment  Patient Details  Name: Curtis Singh MRN: [id number] Date of Birth: 1955/05/22 Referring Provider (PT): Dr. Lynann Bologna   Encounter Date: 07/06/2018  PT End of Session - 07/27/18 0737    Visit Number  19    Number of Visits  20    Date for PT Re-Evaluation  07/13/18    PT Start Time  1502    PT Stop Time  1558    PT Time Calculation (min)  56 min    Activity Tolerance  Patient tolerated treatment well;Patient limited by pain    Behavior During Therapy  [facility] for tasks assessed/performed       Past Medical History:  Diagnosis Date  . Diabetes ([place])    type 2 x 14 years  . ED (erectile dysfunction)   . GERD (gastroesophageal reflux disease)   . 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 Hypertension   . Seasonal allergies     Past Surgical History:  Procedure Laterality Date  . ARTHOSCOPIC ROTAOR CUFF REPAIR Left 02/25/2018   Procedure: LEFT ARTHROSCOPIC ROTATOR CUFF REPAIR; SUBACROMIAL DECOMPRESSION;  Surgeon: Tania Ade, MD;  Location: [facility];  Service: Orthopedics;  Laterality: Left;  . CATARACT EXTRACTION W/PHACO Right 09/03/2016   Procedure: CATARACT EXTRACTION PHACO AND INTRAOCULAR LENS PLACEMENT ([place]) RIGHT DIABETIC;  Surgeon: Leandrew Koyanagi, MD;  Location: [place];  Service: Ophthalmology;  Laterality: Right;  DIABETIC  . ROTATOR CUFF REPAIR Right 2009    There were no vitals filed for this visit.  Subjective Assessment - 07/27/18 0723    Subjective  Pt. states he is doing well.  No new complaints and continues to have L shoulder discomfort 3/10 with repetitive tasks/ lifting.    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 He was moving boxes while working and he picked 2 boxes up from roughly waist/chest height and rotated L  with the boxes to set them down. As soon as he put the boxes down, he felt pain his R low back which has spread across the whole low back, but R>L. Patient is currently not picking up anything >20lbs and is to avoid bending, twisting, lifting.  Pt. was being treated for LBP up until L RTC repair surgery on 02/25/2018.  Pt. is hoping to return to work on 03/22/2018 with limited duty secondary to shoulder limitations/ protocol.       Limitations  Lifting;Standing;Walking;House hold activities;Sitting    Diagnostic tests  (-) MRI    Patient Stated Goals  Pain-free movement with work-related tasks/ riding motorcycle    Currently in Pain?  Yes    Pain Score  3     Pain Location  Shoulder    Pain Orientation  Left    Pain Descriptors / Indicators  Aching    Pain Onset  More than a month ago          Therapeutic Exercise:  Seated B UBE 3 min. F/b (warm-up)- increase cadence/no pain Nautilus:50# lat. Pull downs/ 40# tricep extension/40# shoulder extension/ 40# sh. Adduction/ 50# scap. retraction30x each. Bodyblade (all planes) SupineLserratus punch 5#/ sh. Adduction/ press-ups 20x each. Seated 5# bicep curls/ press-ups 20x. Standing L shoulder AROM all planes (focus on increase IR)  Patient educated throughout session on appropriate technique and form using multi-modal cueing, HEP,  and activity modification. Patient articulated understanding and returned demonstration.    PT Long Term Goals - 06/15/18 1640      PT LONG TERM GOAL #1   Title  Pt. will increase FOTO to 67 to improve L shoulder pain-free moblity with daily tasks.     Baseline  FOTO: initial: 36/ goal: 56; 05/17/18: Not performed    Time  4    Period  Weeks    Status  On-going    Target Date  07/13/18      PT LONG TERM GOAL #2   Title  Pt. will increase L shoulder AAROM to [facility] as compared to R shoulder to improve overhead reaching/ functional tasks.    Baseline  L shoulder AROM WFL as compared to R shoulder except ER  60 deg.    Time  4    Period  Weeks    Status  Partially Met    Target Date  07/13/18      PT LONG TERM GOAL #3   Title  Pt. will increase L shoulder strength to grossly 4/5 MMT (flexion/ abduction/ ER) to improve progression of functional/ work-related tasks.      Baseline  L shoulder strength grossly 4/5 MMT with no increase c/o pain.    Time  4    Period  Weeks    Status  Achieved    Target Date  07/13/18      PT LONG TERM GOAL #4   Title  Pt. will report <3/10 L shoulder pain at worst with progression of ROM ex.    Baseline  4-5/10 L shoulder pain at rest/ >9/10 at worst (no pain medications); 05/17/18: worst pain 4-5/10;    Time  4    Period  Weeks    Status  Partially Met    Target Date  07/13/18      PT LONG TERM GOAL #5   Title  Pt. will progress to independent gym based/ wt. training program to improve strength to grossly 5/5 MMT with no pain.    Baseline  See above    Time  4    Period  Weeks    Status  New    Target Date  07/13/18            Plan - 07/27/18 0738    Clinical Impression Statement  Marked improvement in L shoulder pain as compared to last tx. session.  Good L shoulder ROM (all planes) and PT returned tx. focus to strength training/ stabilization ex.  Pt. reports minimal to no increase c/o L shoulder pain during Nautilus ex. but fatigue noted.  PT recommended pt. continue with ther.ex. program.    Stability/Clinical Decision Making  Stable/Uncomplicated    Clinical Decision Making  Low    Rehab Potential  Good    PT Frequency  2x / week    PT Duration  4 weeks    PT Treatment/Interventions  ADLs/Self Care Home Management;Cryotherapy;Electrical Stimulation;Functional mobility training;Therapeutic activities;Therapeutic exercise;Patient/family education;Neuromuscular re-education;Manual techniques;Scar mobilization;Passive range of motion    PT Next Visit Plan  Progress L shoulder strengthening.  MD appt. 7/7    PT Home Exercise Plan  See handouts        Patient will benefit from skilled therapeutic intervention in order to improve the following deficits and impairments:  Decreased mobility, Decreased strength, Impaired flexibility, Hypomobility, Pain, Impaired UE functional use  Visit Diagnosis: 1. S/P left rotator cuff repair   2. Muscle weakness (generalized)  3. Shoulder joint stiffness, left        Problem List Patient Active Problem List   Diagnosis Date Noted  . ED (erectile dysfunction) of organic origin 02/21/2015  . Diabetes ([place]) 10/18/2014  . Hyperlipemia 10/18/2014   Pura Spice, PT, DPT # [id number] 07/27/2018, 7:44 AM   [facility] [address] [place], [place], [address] Phone: [phone]   Fax:  [phone]  Name: BROOKLYN JEFF MRN: [id number] Date of Birth: [birth year]

## 2018-07-08 ENCOUNTER — Ambulatory Visit: Payer: No Typology Code available for payment source | Attending: Orthopedic Surgery | Admitting: Physical Therapy

## 2018-07-08 ENCOUNTER — Telehealth: Payer: Self-pay | Admitting: Urology

## 2018-07-08 ENCOUNTER — Other Ambulatory Visit: Payer: Self-pay

## 2018-07-08 DIAGNOSIS — M6281 Muscle weakness (generalized): Secondary | ICD-10-CM

## 2018-07-08 DIAGNOSIS — M25612 Stiffness of left shoulder, not elsewhere classified: Secondary | ICD-10-CM

## 2018-07-08 DIAGNOSIS — Z9889 Other specified postprocedural states: Secondary | ICD-10-CM | POA: Diagnosis present

## 2018-07-08 MED ORDER — NONFORMULARY OR COMPOUNDED ITEM: 6 refills | Status: DC

## 2018-07-08 NOTE — Telephone Encounter (Signed)
Ok to refill? Pt scheduled f/u for 08/09/2018

## 2018-07-08 NOTE — Telephone Encounter (Signed)
Pt scheduled follow up appt 1st available appt in August. Pt needs Trimix refill w/syringes.  Please advise. Thanks.

## 2018-07-08 NOTE — Telephone Encounter (Signed)
RX sent to pharmacy. Patient notified.  

## 2018-07-09 NOTE — Therapy (Addendum)
[facility] [address] [place], [place], [address] Phone: [phone]   Fax:  [phone]  Physical Therapy Treatment  Patient Details  Name: Curtis Singh MRN: [id number] Date of Birth: Jun 05, 1955 Referring Provider (PT): Dr. Lynann Bologna   Encounter Date: 07/08/2018  Treatment: 20 of 20.  Recert date: 06/14/6293  1501 to 1555   Past Medical History:  Diagnosis Date  . Diabetes ([place])    type 2 x 14 years  . ED (erectile dysfunction)   . GERD (gastroesophageal reflux disease)   . 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 Hypertension   . Seasonal allergies     Past Surgical History:  Procedure Laterality Date  . ARTHOSCOPIC ROTAOR CUFF REPAIR Left 02/25/2018   Procedure: LEFT ARTHROSCOPIC ROTATOR CUFF REPAIR; SUBACROMIAL DECOMPRESSION;  Surgeon: Tania Ade, MD;  [facility];  Service: Orthopedics;  Laterality: Left;  . CATARACT EXTRACTION W/PHACO Right 09/03/2016   Procedure: CATARACT EXTRACTION PHACO AND INTRAOCULAR LENS PLACEMENT ([place]) RIGHT DIABETIC;  Surgeon: Leandrew Koyanagi, MD;  Location: [place];  Service: Ophthalmology;  Laterality: Right;  DIABETIC  . ROTATOR CUFF REPAIR Right 2009    There were no vitals filed for this visit.    Pt. states he has been really busy at work. No c/o L shoulder pain at this time. Pt. reports low back is doing okay. Pt. has been riding motorcycle more often and still notices L shoulder discomfort with maneuvering bike at stops.         Therapeutic Exercise:  Seated B UBE 3 min. F/b (warm-up)- increase cadence/no pain Nautilus:50# lat. Pull downs/ 40# tricep extension/40# shoulder extension/40# sh. Adduction/50# scap. retraction30x each. Bodyblade (all planes) SupineLserratus punch 5#/ sh. Adduction/ press-ups 20x each. Seated 5# bicep curls/press-ups 20x. Standing L shoulder AROM all planes (focus on increase IR)  Patient educated  throughout session on appropriate technique and form using multi-modal cueing, HEP, and activity modification. Patient articulated understanding and returned demonstration.   PT Short Term Goals - 06/30/18 2040      PT SHORT TERM GOAL #1   Title  Pt. will increase FOTO to 65 to improve pain-free mobility with lifting/ work-related tasks.    Baseline  initial FOTO on 06/01/2018:  59.   6/23: 72.    Time  4    Period  Weeks    Status  Achieved    Target Date  06/29/18      PT SHORT TERM GOAL #2   Title  Pt. able to demonstrate proper lifting technique of 40# box with no c/o R low back pain to improve work-related tasks.     Baseline  Good mechanics with UE lifting ex./ box carrying/ sled push and pull.    Time  4    Period  Weeks    Status  Achieved    Target Date  06/29/18      PT SHORT TERM GOAL #3   Title  Pt. will report no R SI/lumbar tenderness with palpation to improve pain-free mobility.      Baseline  (+) R SI tenderness.  Pt. reports no pain at rest in sitting/standing posture.     Time  4    Period  Weeks    Status  Partially Met    Target Date  06/29/18      PT SHORT TERM GOAL #4   Title  Pt. able to ride motorcycle with no c/o R low back  pain.      Baseline  Pt. able to ride bike with no limitations.  Pt. did report slight discomfort in low back with handling bike at stops.    Time  4    Period  Weeks    Status  Achieved    Target Date  06/29/18        PT Long Term Goals - 06/15/18 1640      PT LONG TERM GOAL #1   Title  Pt. will increase FOTO to 67 to improve L shoulder pain-free moblity with daily tasks.     Baseline  FOTO: initial: 36/ goal: 67; 05/17/18: Not performed    Time  4    Period  Weeks    Status  On-going    Target Date  07/13/18      PT LONG TERM GOAL #2   Title  Pt. will increase L shoulder AAROM to [facility] as compared to R shoulder to improve overhead reaching/ functional tasks.    Baseline  L shoulder AROM WFL as compared to R shoulder except  ER 60 deg.    Time  4    Period  Weeks    Status  Partially Met    Target Date  07/13/18      PT LONG TERM GOAL #3   Title  Pt. will increase L shoulder strength to grossly 4/5 MMT (flexion/ abduction/ ER) to improve progression of functional/ work-related tasks.      Baseline  L shoulder strength grossly 4/5 MMT with no increase c/o pain.    Time  4    Period  Weeks    Status  Achieved    Target Date  07/13/18      PT LONG TERM GOAL #4   Title  Pt. will report <3/10 L shoulder pain at worst with progression of ROM ex.    Baseline  4-5/10 L shoulder pain at rest/ >9/10 at worst (no pain medications); 05/17/18: worst pain 4-5/10;    Time  4    Period  Weeks    Status  Partially Met    Target Date  07/13/18      PT LONG TERM GOAL #5   Title  Pt. will progress to independent gym based/ wt. training program to improve strength to grossly 5/5 MMT with no pain.    Baseline  See above    Time  4    Period  Weeks    Status  New    Target Date  07/13/18        Pt. continues demonstrate consistent L shoulder ROM (all planes) and progressing with resisted ther.ex. Pain has been minimal and pt. able to participate with more household tasks/ riding motorcycle. Pt. instructed to continue with current HEP/ daily activities in a pain tolerable range. No change to HEP.       Patient will benefit from skilled therapeutic intervention in order to improve the following deficits and impairments:  Decreased mobility, Decreased strength, Impaired flexibility, Hypomobility, Pain, Impaired UE functional use  Visit Diagnosis: 1. S/P left rotator cuff repair   2. Muscle weakness (generalized)   3. Shoulder joint stiffness, left        Problem List Patient Active Problem List   Diagnosis Date Noted  . ED (erectile dysfunction) of organic origin 02/21/2015  . Diabetes ([place]) 10/18/2014  . Hyperlipemia 10/18/2014   Pura Spice, PT, DPT # [id number] 08/10/2018, 8:25 AM  [place]  [facility] [address] [place], [place], [address] Phone: [phone]   Fax:  [phone]  Name: BING DUFFEY MRN: [id number] Date of Birth: 1955-10-04

## 2018-07-13 ENCOUNTER — Ambulatory Visit: Payer: No Typology Code available for payment source | Admitting: Physical Therapy

## 2018-07-13 ENCOUNTER — Other Ambulatory Visit: Payer: Self-pay

## 2018-07-13 DIAGNOSIS — Z9889 Other specified postprocedural states: Secondary | ICD-10-CM

## 2018-07-13 DIAGNOSIS — M6281 Muscle weakness (generalized): Secondary | ICD-10-CM

## 2018-07-13 DIAGNOSIS — M25612 Stiffness of left shoulder, not elsewhere classified: Secondary | ICD-10-CM

## 2018-07-16 ENCOUNTER — Encounter: Payer: Self-pay | Admitting: Physical Therapy

## 2018-07-16 NOTE — Therapy (Signed)
[facility] [address] [place], [place], [address] Phone: [phone]   Fax:  [phone]  Physical Therapy Treatment  Patient Details  Name: Curtis Singh MRN: [id number] Date of Birth: 1955-12-26 Referring Provider (PT): Dr. Lynann Bologna   Encounter Date: 07/13/2018    Past Medical History:  Diagnosis Date  . Diabetes ([place])    type 2 x 14 years  . ED (erectile dysfunction)   . GERD (gastroesophageal reflux disease)   . 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 Hypertension   . Seasonal allergies     Past Surgical History:  Procedure Laterality Date  . ARTHOSCOPIC ROTAOR CUFF REPAIR Left 02/25/2018   Procedure: LEFT ARTHROSCOPIC ROTATOR CUFF REPAIR; SUBACROMIAL DECOMPRESSION;  Surgeon: Tania Ade, MD;  [facility];  Service: Orthopedics;  Laterality: Left;  . CATARACT EXTRACTION W/PHACO Right 09/03/2016   Procedure: CATARACT EXTRACTION PHACO AND INTRAOCULAR LENS PLACEMENT ([place]) RIGHT DIABETIC;  Surgeon: Leandrew Koyanagi, MD;  Location: [place];  Service: Ophthalmology;  Laterality: Right;  DIABETIC  . ROTATOR CUFF REPAIR Right 2009    There were no vitals filed for this visit.                              PT Short Term Goals - 06/30/18 2040      PT SHORT TERM GOAL #1   Title  Pt. will increase FOTO to 65 to improve pain-free mobility with lifting/ work-related tasks.    Baseline  initial FOTO on 06/01/2018:  59.   6/23: 72.    Time  4    Period  Weeks    Status  Achieved    Target Date  06/29/18      PT SHORT TERM GOAL #2   Title  Pt. able to demonstrate proper lifting technique of 40# box with no c/o R low back pain to improve work-related tasks.     Baseline  Good mechanics with UE lifting ex./ box carrying/ sled push and pull.    Time  4    Period  Weeks    Status  Achieved    Target Date  06/29/18      PT SHORT TERM GOAL #3   Title  Pt. will report no  R SI/lumbar tenderness with palpation to improve pain-free mobility.      Baseline  (+) R SI tenderness.  Pt. reports no pain at rest in sitting/standing posture.     Time  4    Period  Weeks    Status  Partially Met    Target Date  06/29/18      PT SHORT TERM GOAL #4   Title  Pt. able to ride motorcycle with no c/o R low back pain.      Baseline  Pt. able to ride bike with no limitations.  Pt. did report slight discomfort in low back with handling bike at stops.    Time  4    Period  Weeks    Status  Achieved    Target Date  06/29/18        PT Long Term Goals - 06/15/18 1640      PT LONG TERM GOAL #1   Title  Pt. will increase FOTO to 67 to improve L shoulder pain-free moblity with daily tasks.     Baseline  FOTO: initial: 36/ goal: 67; 05/17/18: Not performed  Time  4    Period  Weeks    Status  On-going    Target Date  07/13/18      PT LONG TERM GOAL #2   Title  Pt. will increase L shoulder AAROM to [facility] as compared to R shoulder to improve overhead reaching/ functional tasks.    Baseline  L shoulder AROM WFL as compared to R shoulder except ER 60 deg.    Time  4    Period  Weeks    Status  Partially Met    Target Date  07/13/18      PT LONG TERM GOAL #3   Title  Pt. will increase L shoulder strength to grossly 4/5 MMT (flexion/ abduction/ ER) to improve progression of functional/ work-related tasks.      Baseline  L shoulder strength grossly 4/5 MMT with no increase c/o pain.    Time  4    Period  Weeks    Status  Achieved    Target Date  07/13/18      PT LONG TERM GOAL #4   Title  Pt. will report <3/10 L shoulder pain at worst with progression of ROM ex.    Baseline  4-5/10 L shoulder pain at rest/ >9/10 at worst (no pain medications); 05/17/18: worst pain 4-5/10;    Time  4    Period  Weeks    Status  Partially Met    Target Date  07/13/18      PT LONG TERM GOAL #5   Title  Pt. will progress to independent gym based/ wt. training program to improve strength  to grossly 5/5 MMT with no pain.    Baseline  See above    Time  4    Period  Weeks    Status  New    Target Date  07/13/18              Patient will benefit from skilled therapeutic intervention in order to improve the following deficits and impairments:     Visit Diagnosis: 1. S/P left rotator cuff repair   2. Muscle weakness (generalized)   3. Shoulder joint stiffness, left        Problem List Patient Active Problem List   Diagnosis Date Noted  . ED (erectile dysfunction) of organic origin 02/21/2015  . Diabetes ([place]) 10/18/2014  . Hyperlipemia 10/18/2014    Pura Spice 07/16/2018, 9:16 AM  [place] [facility] [address] [place], [place], [address] Phone: [phone]   Fax:  [phone]  Name: Curtis Singh MRN: [id number] Date of Birth: 07/01/1955

## 2018-07-27 ENCOUNTER — Ambulatory Visit: Payer: No Typology Code available for payment source | Admitting: Physical Therapy

## 2018-08-08 NOTE — Progress Notes (Signed)
08/09/2018 9:01 AM   C[facility] Singh 20-Aug-1955 [id number]  Referring provider: Cletis Athens, MD [facility],  [place] [address]  Chief Complaint  Patient presents with  . Erectile Dysfunction    HPI: [name] is a 63 year old male with ED and BPH with LU TS who presents today for a follow up visit.  Erectile dysfunction His SHIM score is 20, which is mild ED.   His previous SHIM score was 7.  He has been having difficulty with erections for several years.   His major complaint is lack of firmness for erections.  His libido is preserved.   His risk factors for ED are age, BPH, DM, HTN and HLD.  He denies any painful erections or curvatures with his erections.   He is still having spontaneous erections.  He has tried PDE5i and Trimix in the past, since he is now having spontaneous erections he would like to retry the Viagra.   SHIM    Row Name 08/09/18 0845         SHIM: Over the last 6 months:   How do you rate your confidence that you could get and keep an erection?  Very Low     When you had erections with sexual stimulation, how often were your erections hard enough for penetration (entering your partner)?  Most Times (much more than half the time)     During sexual intercourse, how often were you able to maintain your erection after you had penetrated (entered) your partner?  Almost Always or Always     During sexual intercourse, how difficult was it to maintain your erection to completion of intercourse?  Not Difficult     When you attempted sexual intercourse, how often was it satisfactory for you?  Almost Always or Always       SHIM Total Score   SHIM  20        Score: 1-7 Severe ED 8-11 Moderate ED 12-16 Mild-Moderate ED 17-21 Mild ED 22-25 No ED   BPH WITH LUTS  (prostate and/or bladder) IPSS score: 6/1  Previous score: 5/2    Major complaint(s): Intermittency, but it is not very often.  Denies any dysuria, hematuria or suprapubic pain.    Denies any recent fevers, chills, nausea or vomiting.  He does not have a family history of PCa.  IPSS    Row Name 08/09/18 0800         International Prostate Symptom Score   How often have you had the sensation of not emptying your bladder?  Not at All     How often have you had to urinate less than every two hours?  Not at All     How often have you found you stopped and started again several times when you urinated?  Less than half the time     How often have you found it difficult to postpone urination?  Not at All     How often have you had a weak urinary stream?  Less than half the time     How often have you had to strain to start urination?  Less than 1 in 5 times     How many times did you typically get up at night to urinate?  1 Time     Total IPSS Score  6       Quality of Life due to urinary symptoms   If you were to spend the rest  of your life with your urinary condition just the way it is now how would you feel about that?  Pleased        Score:  1-7 Mild 8-19 Moderate 20-35 Severe   PMH: Past Medical History:  Diagnosis Date  . Diabetes ([place])    type 2 x 14 years  . ED (erectile dysfunction)   . GERD (gastroesophageal reflux disease)   . 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 Hypertension   . Seasonal allergies     Surgical History: Past Surgical History:  Procedure Laterality Date  . ARTHOSCOPIC ROTAOR CUFF REPAIR Left 02/25/2018   Procedure: LEFT ARTHROSCOPIC ROTATOR CUFF REPAIR; SUBACROMIAL DECOMPRESSION;  Surgeon: Curtis Ade, MD;  Location: [facility];  Service: Orthopedics;  Laterality: Left;  . CATARACT EXTRACTION W/PHACO Right 09/03/2016   Procedure: CATARACT EXTRACTION PHACO AND INTRAOCULAR LENS PLACEMENT ([place]) RIGHT DIABETIC;  Surgeon: Curtis Koyanagi, MD;  Location: [facility];  Service: Ophthalmology;  Laterality: Right;  DIABETIC  . ROTATOR CUFF REPAIR Right 2009    Home Medications:  Allergies as of 08/09/2018    No Known Allergies     Medication List       Accurate as of August 09, 2018  9:01 AM. If you have any questions, ask your nurse or doctor.        STOP taking these medications   oxyCODONE-acetaminophen 5-325 MG tablet Commonly known as: PERCOCET/ROXICET Stopped by: Curtis Council, PA-C     TAKE these medications   aspirin EC 81 MG tablet Take 1 tablet (81 mg total) by mouth daily.   cyclobenzaprine 5 MG tablet Commonly known as: FLEXERIL   Dexilant 60 MG capsule Generic drug: dexlansoprazole Take 60 mg by mouth at bedtime.   diazepam 5 MG tablet Commonly known as: VALIUM   FreeStyle Lite Devi   FREESTYLE LITE test strip Generic drug: glucose blood   ibuprofen 200 MG tablet Commonly known as: ADVIL Take 800 mg by mouth every 8 (eight) hours as needed for moderate pain.   insulin glargine 100 UNIT/ML injection Commonly known as: LANTUS Inject 12-15 Units into the skin daily as needed (if blood sugar is above 200).   losartan 50 MG tablet Commonly known as: COZAAR Take 50 mg by mouth at bedtime.   metFORMIN 500 MG 24 hr tablet Commonly known as: GLUCOPHAGE-XR Take 500 mg by mouth 2 (two) times daily.   methocarbamol 500 MG tablet Commonly known as: Robaxin Take 1 tablet (500 mg total) by mouth 2 (two) times daily with a meal.   multivitamin with minerals Tabs tablet Take 1 tablet by mouth at bedtime.   NONFORMULARY OR COMPOUNDED ITEM Trimix (30/1/10)-(Pap/Phent/PGE) Dosage: Inject 1.0 cc per injection Prefilled syringe : 10 syringes Qty10 [facility] [phone] Fax [phone]   omeprazole 20 MG capsule Commonly known as: PRILOSEC   rosuvastatin 40 MG tablet Commonly known as: CRESTOR Take 1 tablet (40 mg total) by mouth daily. What changed: when to take this   sildenafil 100 MG tablet Commonly known as: VIAGRA Take 1 tablet (100 mg total) by mouth daily as needed for erectile dysfunction. Started by: Curtis Council,  PA-C   traZODone 50 MG tablet Commonly known as: DESYREL   Unifine Pentips 29G X 12MM Misc Generic drug: Insulin Pen Needle   zolpidem 10 MG tablet Commonly known as: AMBIEN Take 10 mg by mouth at bedtime as needed for sleep.       Allergies: No Known Allergies  Family History: Family History  Problem Relation Age of Onset  . Heart attack Father   . Heart attack Maternal Uncle   . Heart attack Maternal Uncle   . Heart attack Maternal Uncle   . Heart attack Maternal Uncle   . Colon cancer Neg Hx   . Esophageal cancer Neg Hx   . Rectal cancer Neg Hx   . Stomach cancer Neg Hx   . Kidney cancer Neg Hx   . Kidney disease Neg Hx   . Prostate cancer Neg Hx   . Urolithiasis Neg Hx     Social History:  reports that he quit smoking about 36 years ago. He has never used smokeless tobacco. He reports current alcohol use of about 12.0 - 18.0 standard drinks of alcohol per week. He reports that he does not use drugs.  ROS: UROLOGY Frequent Urination?: No Hard to postpone urination?: No Burning/pain with urination?: No Get up at night to urinate?: No Leakage of urine?: No Urine stream starts and stops?: Yes Trouble starting stream?: No Do you have to strain to urinate?: No Blood in urine?: No Urinary tract infection?: No Sexually transmitted disease?: No Injury to kidneys or bladder?: No Painful intercourse?: No Weak stream?: No Erection problems?: Yes Penile pain?: No  Gastrointestinal Nausea?: No Vomiting?: No Indigestion/heartburn?: No Diarrhea?: No Constipation?: No  Constitutional Fever: No Night sweats?: No Weight loss?: No Fatigue?: No  Skin Skin rash/lesions?: No Itching?: No  Eyes Blurred vision?: No Double vision?: No  Ears/Nose/Throat Sore throat?: No Sinus problems?: No  Hematologic/Lymphatic Swollen glands?: No Easy bruising?: No  Cardiovascular Leg swelling?: No Chest pain?: No  Respiratory Cough?: No Shortness of breath?: No   Endocrine Excessive thirst?: No  Musculoskeletal Back pain?: No Joint pain?: No  Neurological Headaches?: No Dizziness?: No  Psychologic Depression?: No Anxiety?: No  Physical Exam: BP 139/79 (BP Location: Left Arm, Patient Position: Sitting, Cuff Size: Normal)   Pulse 66   Ht 5' 10.5" (1.791 m)   Wt 202 lb (91.6 kg)   BMI 28.57 kg/m   Constitutional:  Well nourished. Alert and oriented, No acute distress. HEENT: [place] AT, moist mucus membranes.  Trachea midline, no masses. Cardiovascular: No clubbing, cyanosis, or edema. Respiratory: Normal respiratory effort, no increased work of breathing. GI: Abdomen is soft, non tender, non distended, no abdominal masses. Liver and spleen not palpable.  No hernias appreciated.  Stool sample for occult testing is not indicated.   GU: No CVA tenderness.  No bladder fullness or masses.  Patient with circumcised phallus.   Urethral meatus is patent.  No penile discharge. No penile lesions or rashes. Scrotum without lesions, cysts, rashes and/or edema.  Testicles are located scrotally bilaterally. No masses are appreciated in the testicles. Left and right epididymis are normal. Rectal: Patient with  normal sphincter tone. Anus and perineum without scarring or rashes. No rectal masses are appreciated. Prostate is approximately 50 grams, could only palpate the apex and the midportion of the glands, no nodules are appreciated. Seminal vesicles could not be palpated.   Skin: No rashes, bruises or suspicious lesions. Lymph: No inguinal adenopathy. Neurologic: Grossly intact, no focal deficits, moving all 4 extremities. Psychiatric: Normal mood and affect.  Laboratory Data: PSA History   0.5 ng/mL in 02/2015  0.4 ng/mL in 09/2016  0.36 ng/mL in 08/2017  Results for orders placed or performed during the hospital encounter of 02/25/18  Glucose, capillary  Result Value Ref Range   Glucose-Capillary 132 (H) 70 -  99 mg/dL  Glucose, capillary  Result  Value Ref Range   Glucose-Capillary 105 (H) 70 - 99 mg/dL   I have reviewed the labs  Assessment & Plan:    1. Erectile dysfunction SHIM score is 20, it is greatly improved Would like a prescription for Viagra 100 mg, one tablet two hours prior to intercourse on an empty stomach, # 2; he is warned not to take medications that contain nitrates.  I also advised him of the side effects, such as: headache, flushing, dyspepsia, abnormal vision, nasal congestion, back pain, myalgia, nausea, dizziness, and rash. Advised NOT to take Viagra and use the trimix injection on the same day RTC in 12 months for repeat SHIM score and exam   2.BPH with LUTS:   IPSS score is 6/1, it is stable He will return in 1 year for I PSS score, exam and PSA - if PSA is stable  - PSA   Return in about 1 year (around 08/09/2019) for IPSS, SHIM, PSA and exam.  These notes generated with voice recognition software. I apologize for typographical errors.  Curtis Council, PA-C  [facility] [address] [place], [place] [address] [phone]

## 2018-08-09 ENCOUNTER — Encounter: Payer: Self-pay | Admitting: Urology

## 2018-08-09 ENCOUNTER — Ambulatory Visit
Admission: RE | Admit: 2018-08-09 | Discharge: 2018-08-09 | Disposition: A | Discharge status: Home / Self Care | Payer: No Typology Code available for payment source | Attending: Urology | Admitting: Urology

## 2018-08-09 ENCOUNTER — Ambulatory Visit (INDEPENDENT_AMBULATORY_CARE_PROVIDER_SITE_OTHER): Payer: Self-pay | Admitting: Urology

## 2018-08-09 ENCOUNTER — Other Ambulatory Visit: Payer: Self-pay

## 2018-08-09 VITALS — BP 139/79 | HR 66 | Ht 70.5 in | Wt 202.0 lb

## 2018-08-09 DIAGNOSIS — N529 Male erectile dysfunction, unspecified: Secondary | ICD-10-CM | POA: Insufficient documentation

## 2018-08-09 DIAGNOSIS — N138 Other obstructive and reflux uropathy: Secondary | ICD-10-CM | POA: Diagnosis present

## 2018-08-09 DIAGNOSIS — N401 Enlarged prostate with lower urinary tract symptoms: Secondary | ICD-10-CM | POA: Insufficient documentation

## 2018-08-09 LAB — PSA: Prostatic Specific Antigen: 0.32 ng/mL (ref 0.00–4.00)

## 2018-08-09 MED ORDER — SILDENAFIL CITRATE 100 MG PO TABS: 100.0000 mg | ORAL_TABLET | Freq: Every day | ORAL | 6 refills | Status: DC | PRN

## 2019-02-21 NOTE — Progress Notes (Signed)
Office Visit    Patient Name: [facility] Singh Date of Encounter: 02/23/2019  Primary Care Provider:  Cletis Athens, MD Primary Cardiologist:  [name], MD  Chief Complaint    64 yo male with history of aortic atherosclerosis and no other previously known cardiac history, DM2 on insulin, HLD, HTN, and strong family history of CAD, and here for 1 year follow-up.  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    Hypertension    Seasonal allergies    Past Surgical History:  Procedure Laterality Date   ARTHOSCOPIC ROTAOR CUFF REPAIR Left 02/25/2018   Procedure: LEFT ARTHROSCOPIC ROTATOR CUFF REPAIR; SUBACROMIAL DECOMPRESSION;  Surgeon: Tania Ade, MD;  Location: [facility];  Service: Orthopedics;  Laterality: Left;   CATARACT EXTRACTION W/PHACO Right 09/03/2016   Procedure: CATARACT EXTRACTION PHACO AND INTRAOCULAR LENS PLACEMENT ([place]) RIGHT DIABETIC;  Surgeon: Leandrew Koyanagi, MD;  Location: [facility];  Service: Ophthalmology;  Laterality: Right;  DIABETIC   ROTATOR CUFF REPAIR Right 2009    Allergies  No Known Allergies  History of Present Illness    64 yo male with no previous cardiac history, non-smoker, but strong family history of CAD and here for 1 year follow-up. He was referred to [facility] for evaluation of aortic atherosclerosis as noted on back xray. No previous cardiac history. He has known DM2 on insulin for approximately 15 years, HLD, and mild HTN. He also has a strong family history of CAD with father and uncles that died in their 38s due to MI. Recommendation at his initial appointment was for treadmill stress test and to start ASA 81 mg. Treadmill stress not obtained. Given his LDL was still not at goal on his current statin, he was switched to rosuvastatin  40mg  once daily.  Dietary and ifestyle changes were also discussed.BP 132/70 and weight 207 lbs. Since that time, he reports that he has not been able to escalate activity as tolerated due to recent surgery for his rotator cuff, In addition, he has been dealing with foot pain for which he is seeing podiatry. He reports trying new shoes / insoles without much relief. He also reports DOE and fatigue,. No orthopnea, abdominal distention, or PND. No presyncope or syncope. No racing HR or palpitations. He continues to not smoke and is trying to make healthy dietary choices. He reports medication compliance though has stopped his ACE/ARB 2/2 intolerance due to cough. His medication list included metoprolol-HCTZ but he has remained on metoprolol succinate 50mg  daily in addition to his ASA and statin. No s/sx of bleeding.  Home Medications    Prior to Admission medications   Medication Sig Start Date End Date Taking? Authorizing Provider  aspirin EC 81 MG tablet Take 1 tablet (81 mg total) by mouth daily. 02/09/18   Wellington Hampshire, MD  Blood Glucose Monitoring Suppl (FREESTYLE LITE) DEVI  03/05/18   [provider]  cyclobenzaprine (FLEXERIL) 5 MG tablet  06/23/18   [provider]  dexlansoprazole (DEXILANT) 60 MG capsule Take 60 mg by mouth at bedtime.    [provider]  diazepam (VALIUM) 5 MG tablet  03/16/18   [provider]  FREESTYLE LITE test strip  03/05/18   [provider]  ibuprofen (ADVIL,MOTRIN) 200  MG tablet Take 800 mg by mouth every 8 (eight) hours as needed for moderate pain.    [provider]  insulin glargine (LANTUS) 100 UNIT/ML injection Inject 12-15 Units into the skin daily as needed (if blood sugar is above 200).    [provider]  losartan (COZAAR) 50 MG tablet Take 50 mg by mouth at bedtime.     [provider]  metFORMIN (GLUCOPHAGE-XR) 500 MG 24 hr tablet Take 500 mg by mouth 2 (two) times daily.    [provider]  methocarbamol (ROBAXIN) 500 MG tablet Take 1 tablet (500 mg total) by mouth 2 (two) times daily with a meal. 02/25/18   Leighton Parody, PA-C  Multiple Vitamin (MULTIVITAMIN WITH MINERALS) TABS tablet Take 1 tablet by mouth at bedtime.    [provider]  NONFORMULARY OR COMPOUNDED ITEM Trimix (30/1/10)-(Pap/Phent/PGE) Dosage: Inject 1.0 cc per injection Prefilled syringe : 10 syringes Qty10 [place] [phone] Fax [phone] 07/08/18   Zara Council A, PA-C  omeprazole (PRILOSEC) 20 MG capsule  07/05/18   [provider]  rosuvastatin (CRESTOR) 40 MG tablet Take 1 tablet (40 mg total) by mouth daily. Patient taking differently: Take 40 mg by mouth at bedtime.  02/09/18 05/10/18  Wellington Hampshire, MD  sildenafil (VIAGRA) 100 MG tablet Take 1 tablet (100 mg total) by mouth daily as needed for erectile dysfunction. 08/09/18   Zara Council A, PA-C  traZODone (DESYREL) 50 MG tablet  07/08/18   [provider]  UNIFINE PENTIPS 29G X 12MM MISC  04/12/18   [provider]  zolpidem (AMBIEN) 10 MG tablet Take 10 mg by mouth at bedtime as needed for sleep.    [provider]    Review of Systems    He denies chest pain, palpitations, pnd, orthopnea, n, v, dizziness, syncope, edema, weight gain, or early satiety. He reports dyspnea.  All other systems reviewed and are otherwise negative except as noted above.  Physical Exam    VS:  BP 132/72 (BP Location: Left Arm, Patient Position: Sitting, Cuff Size: Normal)    Pulse 65    Ht 5' 10.5" (1.791 m)    Wt 207 lb (93.9 kg)    SpO2 97%    BMI 29.28 kg/m  , BMI Body mass index is 29.28 kg/m. GEN: Well nourished, well developed, in no acute distress.Mask in place.  HEENT: normal. Neck: Supple, no JVD, carotid bruits, or masses. Cardiac: RRR, 1/6 systolic murmur. No rubs, or gallops. No clubbing, cyanosis, edema.  Radials/DP/PT 2+ and equal bilaterally.  Respiratory:   Respirations regular and unlabored, clear to auscultation bilaterally. GI: Soft, nontender, nondistended, BS + x 4. MS: no deformity or atrophy. Skin: warm and dry, no rash. Neuro:  Strength and sensation are intact. Psych: Normal affect.  Accessory Clinical Findings    ECG personally reviewed by me today - NSR, 65bpm, QRS 92, repolarization abnormalities  - no acute changes.  Labs 02/2018 Na 140, potassium 4.2, creatinine 0.79, BUN 17, WBC 5.9, hemoglobin 14.1, hematocrit 43.4 06/18/2017: Total cholesterol 241, HDL 53, LDL 158, triglycerides 149.  TSH 0.931   Filed Weights   02/23/19 1630  Weight: 207 lb (93.9 kg)     Assessment & Plan    Family history of CAD / early cardiac death Aortic atherosclerosis, DM2 Dyspnea on exertion -- Reports progressive DOE, worsened since his last visit. We discussed that this could be secondary to deconditioning and increased weight. Discussed continuing  dietary changes and recommendation to increase activity as tolerated with regular exercise. Continue ASA 81mg  daily and statin for risk factor modification. Control of DM2 also recommended for risk factor modification. Continue current statin. Will reach out to PCP to ensure we have his most recent labs. Avoid nitrates at this time due to current rx for sildenafil. Obtain Lexiscan Myoview given increasing DOE. Also recommend echo to monitor murmur heard on exam and reassess EF as well as rule out WMA or acute structural changes but with patient preference to defer at this time. Reassess at RTC.  Hyperlipidemia --He has remained on rosuvastatin 40mg  daily with LDL goal of less than 70. Most recent LDL was 06/2017 and 158. He states today that he remembers better numbers for his cholesterol and will reach out to his PCP to attempt and get these labs, given our records do not cross over. Otherwise, update lipid and liver. If LDL remains less than 70, recommend lipid clinic referral.   Hypertension ACE/ARB  intolerance --BP today 132/72 and still sub-optimal with recommended goal BP <130/80. Lifestyle changes discussed with patient understanding. His metoprolol - HCTZ 50/25was never filled per a call to the pharmacy. We will therefore add low dose HCTZ 12.5mg  to his medications to assess if this was on his list as it was felt to better control his BP. He does not tolerate ACE/ARBs. Discussed cutting back on ibuprofen if possible as long term use can increase risk of elevated pressure.   DM2 --Continued glycemic control for risk factor modification.  GERD --Continue Prilosec. No s/sx consistent with bleeding.  Disposition: Obtain Lexiscan myoview. Start HCTZ 12.5mg  to trial this medication, previously prescribed but not filled. BMET/Mg today and in 1 week. Obtain labs from PCP. At RTC, consider lipid referral as discussed today.   Arvil Chaco, PA-C 02/23/2019,

## 2019-02-23 ENCOUNTER — Ambulatory Visit (INDEPENDENT_AMBULATORY_CARE_PROVIDER_SITE_OTHER): Payer: No Typology Code available for payment source | Admitting: Physician Assistant

## 2019-02-23 ENCOUNTER — Other Ambulatory Visit: Payer: Self-pay

## 2019-02-23 ENCOUNTER — Other Ambulatory Visit (HOSPITAL_COMMUNITY): Payer: Self-pay

## 2019-02-23 ENCOUNTER — Encounter: Payer: Self-pay | Admitting: Physician Assistant

## 2019-02-23 VITALS — BP 132/72 | HR 65 | Ht 70.5 in | Wt 207.0 lb

## 2019-02-23 DIAGNOSIS — I700 Atherosclerosis of aorta: Secondary | ICD-10-CM

## 2019-02-23 DIAGNOSIS — I10 Essential (primary) hypertension: Secondary | ICD-10-CM

## 2019-02-23 DIAGNOSIS — E785 Hyperlipidemia, unspecified: Secondary | ICD-10-CM

## 2019-02-23 DIAGNOSIS — K219 Gastro-esophageal reflux disease without esophagitis: Secondary | ICD-10-CM

## 2019-02-23 DIAGNOSIS — R0609 Other forms of dyspnea: Secondary | ICD-10-CM

## 2019-02-23 DIAGNOSIS — E119 Type 2 diabetes mellitus without complications: Secondary | ICD-10-CM

## 2019-02-23 DIAGNOSIS — Z8249 Family history of ischemic heart disease and other diseases of the circulatory system: Secondary | ICD-10-CM

## 2019-02-23 DIAGNOSIS — Z87891 Personal history of nicotine dependence: Secondary | ICD-10-CM

## 2019-02-23 DIAGNOSIS — R0600 Dyspnea, unspecified: Secondary | ICD-10-CM | POA: Diagnosis not present

## 2019-02-23 DIAGNOSIS — Z789 Other specified health status: Secondary | ICD-10-CM

## 2019-02-23 MED ORDER — HYDROCHLOROTHIAZIDE 12.5 MG PO CAPS: 12.5000 mg | ORAL_CAPSULE | Freq: Every day | ORAL | 6 refills | Status: DC

## 2019-02-23 NOTE — Patient Instructions (Addendum)
Medication Instructions:  - Your physician has recommended you make the following change in your medication:   1) Start hydrochlorothiazide (HCTZ) 12.5 mg- take 1 tablet by mouth once daily  *If you need a refill on your cardiac medications before your next appointment, please call your pharmacy*  Lab Work: - Your physician recommends that you have lab work today: BMP/ Magnesium  - Your physician recommends that you return for FASTING lab work (the day of your stress test): BMP/ Magnesium/ Lipid/ Liver  - You will have these done in the [facility] just prior/ just after your stress test - Lab hours: Monday-Friday (7:30 am- 5:30 pm)  If you have labs (blood work) drawn today and your tests are completely normal, you will receive your results only by: Marland Kitchen MyChart Message (if you have MyChart) OR . A paper copy in the mail If you have any lab test that is abnormal or we need to change your treatment, we will call you to review the results.  Testing/Procedures: - Your physician has requested that you have a lexiscan myoview.   - [place]  Your caregiver has ordered a Stress Test with nuclear imaging. The purpose of this test is to evaluate the blood supply to your heart muscle. This procedure is referred to as a "Non-Invasive Stress Test." This is because other than having an IV started in your vein, nothing is inserted or "invades" your body. Cardiac stress tests are done to find areas of poor blood flow to the heart by determining the extent of coronary artery disease (CAD). Some patients exercise on a treadmill, which naturally increases the blood flow to your heart, while others who are  unable to walk on a treadmill due to physical limitations have a pharmacologic/chemical stress agent called Lexiscan . This medicine will mimic walking on a treadmill by temporarily increasing your coronary blood flow.   Please note: these test may take anywhere between 2-4 hours to complete  PLEASE  REPORT TO [facility] AT THE FIRST DESK WILL DIRECT YOU WHERE TO GO  Date of Procedure:_____________________________________  Arrival Time for Procedure:______________________________  Instructions regarding medication:   __x__ : Hold diabetes medication morning of procedure (all oral diabetic meds/ insulin)  __x__:  Hold betablocker(s) night before procedure and morning of test (metoprolol succinate)  _x___:  Hold other medications as follows: HCTZ the morning of your test  __x__: You may take all of your other regular morning medications not listed above, the morning of your test  PLEASE NOTIFY THE OFFICE AT LEAST 24 HOURS IN ADVANCE IF YOU ARE UNABLE TO KEEP YOUR APPOINTMENT.  [phone] AND  PLEASE NOTIFY NUCLEAR MEDICINE AT [facility] AT LEAST 24 HOURS IN ADVANCE IF YOU ARE UNABLE TO KEEP YOUR APPOINTMENT. [phone]  How to prepare for your Myoview test:  1. Do not eat or drink after midnight 2. No caffeine for 24 hours prior to test 3. No smoking 24 hours prior to test. 4. Your medication may be taken with water.  If your doctor stopped a medication because of this test, do not take that medication. 5. Ladies, please do not wear dresses.  Skirts or pants are appropriate. Please wear a short sleeve shirt. 6. No perfume, cologne or lotion. 7. Wear comfortable walking shoes. No heels!  Follow-Up: At [facility], you and your health needs are our priority.  As part of our continuing mission to provide you with exceptional heart care, we have created designated  Provider Care Teams.  These Care Teams include your primary Cardiologist (physician) and Advanced Practice Providers (APPs -  Physician Assistants and Nurse Practitioners) who all work together to provide you with the care you need, when you need it.  Your next appointment:   1 month(s)  The format for your next appointment:   In Person  Provider:    You may see [name], MD or  one of the following Advanced Practice Providers on your designated Care Team:    Murray Hodgkins, NP  Christell Faith, PA-C  Marrianne Mood, PA-C   Other Instructions - n/a

## 2019-02-24 ENCOUNTER — Other Ambulatory Visit: Payer: Self-pay | Admitting: *Deleted

## 2019-02-24 ENCOUNTER — Telehealth: Payer: Self-pay | Admitting: Cardiovascular Disease

## 2019-02-24 DIAGNOSIS — R0600 Dyspnea, unspecified: Secondary | ICD-10-CM

## 2019-02-24 DIAGNOSIS — R0609 Other forms of dyspnea: Secondary | ICD-10-CM

## 2019-02-24 DIAGNOSIS — Z79899 Other long term (current) drug therapy: Secondary | ICD-10-CM

## 2019-02-24 NOTE — Telephone Encounter (Signed)
Order for [place] an myoview and [facility] to be [facility] and are in [place].

## 2019-02-24 NOTE — Telephone Encounter (Signed)
Patient scheduled   He wants to do his labs in [place] at the med center there  and wants to make sure the order is still good for that lab.

## 2019-02-24 NOTE — Telephone Encounter (Signed)
Per checkout 1 month fu lexi and labs .   Orders needed.  Will call patient

## 2019-02-25 NOTE — Telephone Encounter (Signed)
Spoke with the patient. Advised the patient that the [facility] [place] is a [facility] facility and should have access to his lab orders that are Epic. Advised the patient that the labs should be fasting. Patient verbalized understanding and voiced appreciation for the call back.

## 2019-03-02 ENCOUNTER — Telehealth: Payer: Self-pay | Admitting: *Deleted

## 2019-03-02 ENCOUNTER — Other Ambulatory Visit
Admission: RE | Admit: 2019-03-02 | Discharge: 2019-03-02 | Disposition: A | Discharge status: Home / Self Care | Payer: No Typology Code available for payment source | Attending: Physician Assistant | Admitting: Physician Assistant

## 2019-03-02 ENCOUNTER — Other Ambulatory Visit: Payer: Self-pay

## 2019-03-02 DIAGNOSIS — Z79899 Other long term (current) drug therapy: Secondary | ICD-10-CM | POA: Diagnosis present

## 2019-03-02 DIAGNOSIS — R0609 Other forms of dyspnea: Secondary | ICD-10-CM

## 2019-03-02 DIAGNOSIS — R0600 Dyspnea, unspecified: Secondary | ICD-10-CM | POA: Diagnosis not present

## 2019-03-02 DIAGNOSIS — E785 Hyperlipidemia, unspecified: Secondary | ICD-10-CM | POA: Diagnosis present

## 2019-03-02 LAB — LIPID PANEL
Cholesterol: 158 mg/dL (ref 0–200)
HDL: 56 mg/dL (ref >40)
LDL Cholesterol: 69 mg/dL (ref 0–99)
Total CHOL/HDL Ratio: 2.8 RATIO
Triglycerides: 163 mg/dL — ABNORMAL HIGH (ref <150)
VLDL: 33 mg/dL (ref 0–40)

## 2019-03-02 LAB — HEPATIC FUNCTION PANEL
ALT: 30 U/L (ref 0–44)
AST: 19 U/L (ref 15–41)
Albumin: 4.4 g/dL (ref 3.5–5.0)
Alkaline Phosphatase: 60 U/L (ref 38–126)
Bilirubin, Direct: 0.1 mg/dL (ref 0.0–0.2)
Indirect Bilirubin: 0.7 mg/dL (ref 0.3–0.9)
Total Bilirubin: 0.8 mg/dL (ref 0.3–1.2)
Total Protein: 7.1 g/dL (ref 6.5–8.1)

## 2019-03-02 LAB — BASIC METABOLIC PANEL
Anion gap: 10 (ref 5–15)
BUN: 17 mg/dL (ref 8–23)
CO2: 24 mmol/L (ref 22–32)
Calcium: 8.9 mg/dL (ref 8.9–10.3)
Chloride: 102 mmol/L (ref 98–111)
Creatinine, Ser: 0.73 mg/dL (ref 0.61–1.24)
GFR calc Af Amer: >60 mL/min (ref >60)
GFR calc non Af Amer: >60 mL/min (ref >60)
Glucose, Bld: 253 mg/dL — ABNORMAL HIGH (ref 70–99)
Potassium: 4.2 mmol/L (ref 3.5–5.1)
Sodium: 136 mmol/L (ref 135–145)

## 2019-03-02 LAB — MAGNESIUM: Magnesium: 2 mg/dL (ref 1.7–2.4)

## 2019-03-02 NOTE — Telephone Encounter (Signed)
Patient informed. Copy sent to PCP °

## 2019-03-02 NOTE — Telephone Encounter (Signed)
-----   Message from Arvil Chaco, PA-C sent at 03/02/2019 12:43 PM EST ----- Good news! Renal function, electrolytes, and liver function stable.

## 2019-03-03 ENCOUNTER — Other Ambulatory Visit: Payer: Self-pay

## 2019-03-03 ENCOUNTER — Encounter
Admission: RE | Admit: 2019-03-03 | Discharge: 2019-03-03 | Disposition: A | Discharge status: Home / Self Care | Payer: No Typology Code available for payment source | Source: Ambulatory Visit | Attending: Physician Assistant | Admitting: Physician Assistant

## 2019-03-03 DIAGNOSIS — R0600 Dyspnea, unspecified: Secondary | ICD-10-CM | POA: Diagnosis not present

## 2019-03-03 DIAGNOSIS — R0609 Other forms of dyspnea: Secondary | ICD-10-CM

## 2019-03-03 LAB — NM MYOCAR MULTI W/SPECT W/WALL MOTION / EF
Estimated workload: 1 METS
Exercise duration (min): 0 min
Exercise duration (sec): 0 s
LV dias vol: 102 mL (ref 62–150)
LV sys vol: 36 mL
MPHR: 159 {beats}/min
Peak HR: 90 {beats}/min
Percent HR: 56 %
Rest HR: 58 {beats}/min
SDS: 0
SRS: 5
SSS: 3
TID: 0.9

## 2019-03-03 MED ORDER — TECHNETIUM TC 99M TETROFOSMIN IV KIT
32.8800 | PACK | Freq: Once | INTRAVENOUS | Status: AC | PRN
  Administered 2019-03-03: 32.88 via INTRAVENOUS

## 2019-03-03 MED ORDER — REGADENOSON 0.4 MG/5ML IV SOLN
0.4000 mg | Freq: Once | INTRAVENOUS | Status: AC
  Administered 2019-03-03: 09:00:00 0.4 mg via INTRAVENOUS
  Filled 2019-03-03: qty 5

## 2019-03-03 MED ORDER — TECHNETIUM TC 99M TETROFOSMIN IV KIT
10.0000 | PACK | Freq: Once | INTRAVENOUS | Status: AC | PRN
  Administered 2019-03-03: 11.07 via INTRAVENOUS

## 2019-03-11 ENCOUNTER — Other Ambulatory Visit: Payer: Self-pay | Admitting: Cardiovascular Disease

## 2019-03-15 ENCOUNTER — Ambulatory Visit: Payer: No Typology Code available for payment source | Admitting: Cardiovascular Disease

## 2019-03-16 ENCOUNTER — Encounter: Payer: Self-pay | Admitting: Cardiovascular Disease

## 2019-04-25 ENCOUNTER — Other Ambulatory Visit: Payer: Self-pay | Admitting: Internal Medicine

## 2019-07-01 ENCOUNTER — Other Ambulatory Visit: Payer: Self-pay

## 2019-07-01 ENCOUNTER — Ambulatory Visit (INDEPENDENT_AMBULATORY_CARE_PROVIDER_SITE_OTHER): Payer: No Typology Code available for payment source | Admitting: Internal Medicine

## 2019-07-01 ENCOUNTER — Encounter: Payer: Self-pay | Admitting: Internal Medicine

## 2019-07-01 VITALS — BP 143/79 | HR 57 | Ht 71.0 in | Wt 200.7 lb

## 2019-07-01 DIAGNOSIS — Z0000 Encounter for general adult medical examination without abnormal findings: Secondary | ICD-10-CM | POA: Insufficient documentation

## 2019-07-01 DIAGNOSIS — N529 Male erectile dysfunction, unspecified: Secondary | ICD-10-CM

## 2019-07-01 DIAGNOSIS — E119 Type 2 diabetes mellitus without complications: Secondary | ICD-10-CM | POA: Diagnosis not present

## 2019-07-01 DIAGNOSIS — E782 Mixed hyperlipidemia: Secondary | ICD-10-CM | POA: Diagnosis not present

## 2019-07-01 LAB — GLUCOSE, POCT (MANUAL RESULT ENTRY): POC Glucose: 241 mg/dl — AB (ref 70–99)

## 2019-07-01 NOTE — Assessment & Plan Note (Signed)
Continue viagra.  

## 2019-07-01 NOTE — Progress Notes (Signed)
Established Patient Office Visit  SUBJECTIVE:  Subjective  Patient ID: Curtis Singh, male    DOB: Mar 26, 1955  Age: 64 y.o. MRN: [id number]  CC:  Chief Complaint  Patient presents with  . Annual Exam    HPI Curtis Singh is a 64 y.o. male presenting today for his annual physical for [facility].   He notes that he is having his blood work completed at his workplace . He notes that his blood sugar averages between 130-140. Today it was 200. He states that he hasn't had to take a lot of insulin recently.    He would like a referral to Dr. Marius Ditch at [place] for a colonoscopy as he is almost 5 years past due. He has not been to see his eye doctor in almost two years.   He has not gotten a COVID-19 vaccine. He has not gotten a pneumonia vaccine. He has also not gotten a shingles vaccine.   Past Medical History:  Diagnosis Date  . Diabetes ([place])    type 2 x 14 years  . ED (erectile dysfunction)   . GERD (gastroesophageal reflux disease)   . 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 Hypertension   . Seasonal allergies     Past Surgical History:  Procedure Laterality Date  . ARTHOSCOPIC ROTAOR CUFF REPAIR Left 02/25/2018   Procedure: LEFT ARTHROSCOPIC ROTATOR CUFF REPAIR; SUBACROMIAL DECOMPRESSION;  Surgeon: Tania Ade, MD;  Location: [facility];  Service: Orthopedics;  Laterality: Left;  . CATARACT EXTRACTION W/PHACO Right 09/03/2016   Procedure: CATARACT EXTRACTION PHACO AND INTRAOCULAR LENS PLACEMENT ([place]) RIGHT DIABETIC;  Surgeon: Leandrew Koyanagi, MD;  Location: [place];  Service: Ophthalmology;  Laterality: Right;  DIABETIC  . ROTATOR CUFF REPAIR Right 2009    Family History  Problem Relation Age of Onset  . Heart attack Father   . Heart attack Maternal Uncle   . Heart attack Maternal Uncle   . Heart attack Maternal Uncle   . Heart attack Maternal Uncle   . Colon cancer Neg Hx   . Esophageal cancer Neg Hx   . Rectal cancer Neg Hx   .  Stomach cancer Neg Hx   . Kidney cancer Neg Hx   . Kidney disease Neg Hx   . Prostate cancer Neg Hx   . Urolithiasis Neg Hx     Social History   Socioeconomic History  . Marital status: Divorced    Spouse name: Not on file  . Number of children: Not on file  . Years of education: Not on file  . Highest education level: Not on file  Occupational History  . Not on file  Tobacco Use  . Smoking status: Former Smoker    Quit date: 01/06/1982    Years since quitting: 37.5  . Smokeless tobacco: Never Used  Vaping Use  . Vaping Use: Never used  Substance and Sexual Activity  . Alcohol use: Yes    Alcohol/week: 24.0 standard drinks    Types: 24 Cans of beer per week    Comment: 1 case on weekend  . Drug use: No  . Sexual activity: Not on file  Other Topics Concern  . Not on file  Social History Narrative  . Not on file   Social Determinants of Health   Financial Resource Strain:   . Difficulty of Paying Living Expenses:   Food Insecurity:   . Worried About Charity fundraiser in the Last Year:   .  Ran Out of Food in the Last Year:   Transportation Needs:   . Film/video editor (Medical):   Marland Kitchen Lack of Transportation (Non-Medical):   Physical Activity:   . Days of Exercise per Week:   . Minutes of Exercise per Session:   Stress:   . Feeling of Stress :   Social Connections:   . Frequency of Communication with Friends and Family:   . Frequency of Social Gatherings with Friends and Family:   . Attends Religious Services:   . Active Member of Clubs or Organizations:   . Attends Archivist Meetings:   Marland Kitchen Marital Status:   Intimate Partner Violence:   . Fear of Current or Ex-Partner:   . Emotionally Abused:   Marland Kitchen Physically Abused:   . Sexually Abused:      Current Outpatient Medications:  .  aspirin EC 81 MG tablet, Take 1 tablet (81 mg total) by mouth daily., Disp: 90 tablet, Rfl: 3 .  Blood Glucose Monitoring Suppl (FREESTYLE LITE) DEVI, , Disp: , Rfl:    .  FREESTYLE LITE test strip, , Disp: , Rfl:  .  ibuprofen (ADVIL,MOTRIN) 200 MG tablet, Take 800 mg by mouth every 8 (eight) hours as needed for moderate pain., Disp: , Rfl:  .  insulin glargine (LANTUS) 100 UNIT/ML injection, Inject 12-15 Units into the skin daily as needed (if blood sugar is above 200)., Disp: , Rfl:  .  metFORMIN (GLUCOPHAGE-XR) 500 MG 24 hr tablet, Take 500 mg by mouth 2 (two) times daily., Disp: , Rfl:  .  metoprolol succinate (TOPROL-XL) 50 MG 24 hr tablet, Take 1 tablet (50 mg) by mouth once a day. Take with or immediately following a meal., Disp: , Rfl:  .  Multiple Vitamin (MULTIVITAMIN WITH MINERALS) TABS tablet, Take 1 tablet by mouth at bedtime., Disp: , Rfl:  .  NONFORMULARY OR COMPOUNDED ITEM, Trimix (30/1/10)-(Pap/Phent/PGE) Dosage: Inject 1.0 cc per injection Prefilled syringe : 10 syringes Qty10 [place] [phone] Fax [phone], Disp: 10 each, Rfl: 6 .  omeprazole (PRILOSEC) 20 MG capsule, , Disp: , Rfl:  .  sildenafil (VIAGRA) 100 MG tablet, Take 1 tablet (100 mg total) by mouth daily as needed for erectile dysfunction., Disp: 30 tablet, Rfl: 6 .  traZODone (DESYREL) 50 MG tablet, Take 50 mg by mouth at bedtime., Disp: , Rfl:  .  UNIFINE PENTIPS 29G X 12MM MISC, , Disp: , Rfl:  .  hydrochlorothiazide (MICROZIDE) 12.5 MG capsule, Take 1 capsule (12.5 mg total) by mouth daily., Disp: 30 capsule, Rfl: 6 .  rosuvastatin (CRESTOR) 40 MG tablet, Take 1 tablet (40 mg total) by mouth at bedtime., Disp: 90 tablet, Rfl: 3   No Known Allergies  ROS Review of Systems  Constitutional: Negative.   HENT: Negative.   Eyes: Negative.   Respiratory: Negative.   Cardiovascular: Negative.   Gastrointestinal: Negative.  Negative for abdominal pain and blood in stool.  Endocrine: Negative.   Genitourinary: Negative.   Musculoskeletal: Negative.   Skin: Negative.   Allergic/Immunologic: Negative.   Neurological: Negative.   Hematological:  Negative.   Psychiatric/Behavioral: Negative.   All other systems reviewed and are negative.    OBJECTIVE:    Physical Exam Vitals reviewed.  Constitutional:      Appearance: Normal appearance.  HENT:     Right Ear: Tympanic membrane, ear canal and external ear normal.     Left Ear: Tympanic membrane, ear canal and external ear normal.  Nose: Nose normal.     Mouth/Throat:     Mouth: Mucous membranes are moist.  Eyes:     Pupils: Pupils are equal, round, and reactive to light.  Neck:     Vascular: No carotid bruit.  Cardiovascular:     Rate and Rhythm: Normal rate and regular rhythm.     Pulses: Normal pulses.     Heart sounds: Murmur heard.  Systolic murmur is present with a grade of 1/6.  No friction rub. No gallop.   Pulmonary:     Effort: Pulmonary effort is normal.     Breath sounds: Normal breath sounds.  Abdominal:     General: Bowel sounds are normal.     Palpations: Abdomen is soft. There is no hepatomegaly, splenomegaly or mass.     Tenderness: There is no abdominal tenderness.     Hernia: No hernia is present.  Genitourinary:    Prostate: Normal.     Rectum: Normal.  Musculoskeletal:     Right lower leg: No edema.     Left lower leg: No edema.  Feet:     Right foot:     Protective Sensation: 10 sites tested. 10 sites sensed.     Left foot:     Protective Sensation: 10 sites tested. 10 sites sensed.  Skin:    Findings: No rash.  Neurological:     Mental Status: He is alert and oriented to person, place, and time.  Psychiatric:        Mood and Affect: Mood normal.        Behavior: Behavior normal.     BP (!) 143/79   Pulse (!) 57   Ht 5\' 11"  (1.803 m)   Wt 200 lb 11.2 oz (91 kg)   BMI 27.99 kg/m  Wt Readings from Last 3 Encounters:  07/01/19 200 lb 11.2 oz (91 kg)  02/23/19 207 lb (93.9 kg)  08/09/18 202 lb (91.6 kg)    Health Maintenance Due  Topic Date Due  . Hepatitis C Screening  Never done  . PNEUMOCOCCAL POLYSACCHARIDE VACCINE  AGE 13-64 HIGH RISK  Never done  . FOOT EXAM  Never done  . OPHTHALMOLOGY EXAM  Never done  . URINE MICROALBUMIN  Never done  . COVID-19 Vaccine (1) Never done  . HIV Screening  Never done  . TETANUS/TDAP  Never done  . HEMOGLOBIN A1C  08/24/2018    There are no preventive care reminders to display for this patient.  CBC Latest Ref Rng & Units 02/23/2018 06/18/2017 06/28/2014  WBC 4.0 - 10.5 K/uL 5.9 5.3 11.4(H)  Hemoglobin 13.0 - 17.0 g/dL 14.1 15.4 15.6  Hematocrit 39 - 52 % 43.4 45.7 46.1  Platelets 150 - 400 K/uL 304 287 246   CMP Latest Ref Rng & Units 03/02/2019 02/23/2018 06/18/2017  Glucose 70 - 99 mg/dL 253(H) 187(H) 164(H)  BUN 8 - 23 mg/dL 17 17 19   Creatinine 0.61 - 1.24 mg/dL 0.73 0.79 0.84  Sodium 135 - 145 mmol/L 136 140 139  Potassium 3.5 - 5.1 mmol/L 4.2 4.2 4.3  Chloride 98 - 111 mmol/L 102 107 103  CO2 22 - 32 mmol/L 24 26 25   Calcium 8.9 - 10.3 mg/dL 8.9 8.9 9.1  Total Protein 6.5 - 8.1 g/dL 7.1 - 7.3  Total Bilirubin 0.3 - 1.2 mg/dL 0.8 - 0.5  Alkaline Phos 38 - 126 U/L 60 - 57  AST 15 - 41 U/L 19 - 21  ALT 0 - 44 U/L  30 - 31    Lab Results  Component Value Date   TSH 0.931 06/18/2017   Lab Results  Component Value Date   ALBUMIN 4.4 03/02/2019   ANIONGAP 10 03/02/2019   Lab Results  Component Value Date   CHOL 158 03/02/2019   CHOL 241 (H) 06/18/2017   CHOL 239 (H) 01/20/2014   HDL 56 03/02/2019   HDL 53 06/18/2017   HDL 48 01/20/2014   LDLCALC 69 03/02/2019   LDLCALC 158 (H) 06/18/2017   LDLCALC 157 (H) 01/20/2014   CHOLHDL 2.8 03/02/2019   CHOLHDL 4.5 06/18/2017   Lab Results  Component Value Date   TRIG 163 (H) 03/02/2019   Lab Results  Component Value Date   HGBA1C 7.5 (H) 02/23/2018   HGBA1C 6.9 (H) 06/18/2017   HGBA1C 6.7 10/18/2014      ASSESSMENT & PLAN:   Problem List Items Addressed This Visit      Endocrine   Diabetes ([place]) - Primary    - The patient's blood sugar is not under control on insulin - The patient will  continue the current treatment regimen.  - I encouraged the patient to regularly check blood sugar.  - I encouraged the patient to monitor diet. I encouraged the patient to eat low-carb and low-sugar to help prevent blood sugar spikes.  - I encouraged the patient to continue following their prescribed treatment plan for diabetes - I informed the patient to get help if blood sugar drops below 54mg /dL, or if suddenly have trouble thinking clearly or breathing. / ref to eye doctor and  Ref  To GI        Relevant Orders   POCT glucose (manual entry) (Completed)   Ambulatory referral to Ophthalmology     Other   Hyperlipemia    - The patient's hyperlipidemia is stable on crestor - The patient will continue the current treatment regimen.  - I encouraged the patient to eat more vegetables and whole wheat, and to avoid fatty foods like whole milk, hard cheese, egg yolks, margarine, baked sweets, and fried foods.  - I encouraged the patient to live an active lifestyle and complete activities for 40 minutes at least three times per week.  - I instructed the patient to go to the ER if they begin having chest pain.        ED (erectile dysfunction) of organic origin    Continue  viagra      Routine adult health maintenance    Patient was referred to GI for colonoscopy.  His prostate examination was normal.  Also refer to eye doctor  Patient need to cut down on beer drinking especially on the weekend.  Physical completed today  Diabetic foot examination was normal.      Relevant Orders   HM COLONOSCOPY (Completed)   Ambulatory referral to Gastroenterology      No orders of the defined types were placed in this encounter.  Type 2 diabetes mellitus without complication, unspecified whether long term insulin use ([place]) - Plan: POCT glucose (manual entry), Ambulatory referral to Ophthalmology  Routine adult health maintenance - Plan: HM COLONOSCOPY, Ambulatory referral to  Gastroenterology  Moderate mixed hyperlipidemia not requiring statin therapy  ED (erectile dysfunction) of organic origin   Follow-up: Return in about 6 months (around 12/31/2019).    Dr. Jane Canary [facility] [address], [place], [place] [address]   By signing my name below, I, [employer], attest that this documentation has  been prepared under the direction and in the presence of Cletis Athens, MD. Electronically Signed: Cletis Athens, MD 07/01/19, 3:42 PM   I personally performed the services described in this documentation, which was SCRIBED in my presence. The recorded information has been reviewed and considered accurate. It has been edited as necessary during review. Cletis Athens, MD

## 2019-07-01 NOTE — Assessment & Plan Note (Signed)
-   The patient's hyperlipidemia is stable on crestor. - The patient will continue the current treatment regimen.  - I encouraged the patient to eat more vegetables and whole wheat, and to avoid fatty foods like whole milk, hard cheese, egg yolks, margarine, baked sweets, and fried foods.  - I encouraged the patient to live an active lifestyle and complete activities for 40 minutes at least three times per week.  - I instructed the patient to go to the ER if they begin having chest pain.   

## 2019-07-01 NOTE — Assessment & Plan Note (Signed)
-   The patient's blood sugar is not under control on insulin - The patient will continue the current treatment regimen.  - I encouraged the patient to regularly check blood sugar.  - I encouraged the patient to monitor diet. I encouraged the patient to eat low-carb and low-sugar to help prevent blood sugar spikes.  - I encouraged the patient to continue following their prescribed treatment plan for diabetes - I informed the patient to get help if blood sugar drops below 54mg /dL, or if suddenly have trouble thinking clearly or breathing. / ref to eye doctor and  Ref  To GI

## 2019-07-01 NOTE — Assessment & Plan Note (Signed)
Patient was referred to GI for colonoscopy.  His prostate examination was normal.  Also refer to eye doctor  Patient need to cut down on beer drinking especially on the weekend.  Physical completed today  Diabetic foot examination was normal.

## 2019-07-04 ENCOUNTER — Other Ambulatory Visit
Admission: RE | Admit: 2019-07-04 | Discharge: 2019-07-04 | Disposition: A | Discharge status: Home / Self Care | Payer: No Typology Code available for payment source | Attending: Internal Medicine | Admitting: Internal Medicine

## 2019-07-04 ENCOUNTER — Other Ambulatory Visit: Payer: Self-pay

## 2019-07-04 DIAGNOSIS — Z0000 Encounter for general adult medical examination without abnormal findings: Secondary | ICD-10-CM | POA: Diagnosis not present

## 2019-07-04 DIAGNOSIS — E785 Hyperlipidemia, unspecified: Secondary | ICD-10-CM | POA: Insufficient documentation

## 2019-07-04 LAB — TSH: TSH: 1.311 u[IU]/mL (ref 0.350–4.500)

## 2019-07-04 LAB — BASIC METABOLIC PANEL
Anion gap: 9 (ref 5–15)
BUN: 24 mg/dL — ABNORMAL HIGH (ref 8–23)
CO2: 26 mmol/L (ref 22–32)
Calcium: 8.9 mg/dL (ref 8.9–10.3)
Chloride: 101 mmol/L (ref 98–111)
Creatinine, Ser: 0.95 mg/dL (ref 0.61–1.24)
GFR calc Af Amer: >60 mL/min (ref >60)
GFR calc non Af Amer: >60 mL/min (ref >60)
Glucose, Bld: 289 mg/dL — ABNORMAL HIGH (ref 70–99)
Potassium: 4.8 mmol/L (ref 3.5–5.1)
Sodium: 136 mmol/L (ref 135–145)

## 2019-07-04 LAB — CBC
HCT: 44.6 % (ref 39.0–52.0)
Hemoglobin: 15 g/dL (ref 13.0–17.0)
MCH: 30.4 pg (ref 26.0–34.0)
MCHC: 33.6 g/dL (ref 30.0–36.0)
MCV: 90.3 fL (ref 80.0–100.0)
Platelets: 233 10*3/uL (ref 150–400)
RBC: 4.94 MIL/uL (ref 4.22–5.81)
RDW: 12.7 % (ref 11.5–15.5)
WBC: 6.3 10*3/uL (ref 4.0–10.5)
nRBC: 0 % (ref 0.0–0.2)

## 2019-07-04 LAB — LIPID PANEL
Cholesterol: 182 mg/dL (ref 0–200)
HDL: 69 mg/dL (ref >40)
LDL Cholesterol: 66 mg/dL (ref 0–99)
Total CHOL/HDL Ratio: 2.6 RATIO
Triglycerides: 233 mg/dL — ABNORMAL HIGH (ref <150)
VLDL: 47 mg/dL — ABNORMAL HIGH (ref 0–40)

## 2019-07-04 LAB — HEMOGLOBIN A1C
Hgb A1c MFr Bld: 11 % — ABNORMAL HIGH (ref 4.8–5.6)
Mean Plasma Glucose: 269 mg/dL

## 2019-07-04 LAB — PSA: Prostatic Specific Antigen: 0.39 ng/mL (ref 0.00–4.00)

## 2019-07-05 LAB — MICROALBUMIN, URINE: Microalb, Ur: 39.9 ug/mL — ABNORMAL HIGH

## 2019-07-06 ENCOUNTER — Other Ambulatory Visit: Payer: Self-pay | Admitting: Internal Medicine

## 2019-07-07 ENCOUNTER — Other Ambulatory Visit: Payer: Self-pay | Admitting: Internal Medicine

## 2019-07-08 ENCOUNTER — Other Ambulatory Visit: Payer: Self-pay

## 2019-07-08 DIAGNOSIS — Z1211 Encounter for screening for malignant neoplasm of colon: Secondary | ICD-10-CM

## 2019-07-12 ENCOUNTER — Other Ambulatory Visit: Payer: Self-pay | Admitting: Internal Medicine

## 2019-07-12 ENCOUNTER — Telehealth: Payer: Self-pay | Admitting: Gastroenterology

## 2019-07-12 ENCOUNTER — Other Ambulatory Visit: Payer: Self-pay

## 2019-07-12 MED ORDER — TRAZODONE HCL 50 MG PO TABS: 50.0000 mg | ORAL_TABLET | Freq: Every day | ORAL | 1 refills | Status: DC

## 2019-07-12 MED ORDER — METOPROLOL SUCCINATE ER 50 MG PO TB24: 50.0000 mg | ORAL_TABLET | Freq: Every day | ORAL | 3 refills | Status: DC

## 2019-07-12 NOTE — Telephone Encounter (Signed)
Gastroenterology Pre-Procedure Review  Request Date: 08/29/2019 Requesting Physician: Dr. Marius Ditch   PATIENT REVIEW QUESTIONS: The patient responded to the following health history questions as indicated:    1. Are you having any GI issues? no 2. Do you have a personal history of Polyps? no 3. Do you have a family history of Colon Cancer or Polyps? no 4. Diabetes Mellitus? yes (Type II) 5. Joint replacements in the past 12 months?no 6. Major health problems in the past 3 months?no 7. Any artificial heart valves, MVP, or defibrillator?no    MEDICATIONS & ALLERGIES:    Patient reports the following regarding taking any anticoagulation/antiplatelet therapy:   Plavix, Coumadin, Eliquis, Xarelto, Lovenox, Pradaxa, Brilinta, or Effient? no Aspirin? yes (81 mg)  Patient confirms/reports the following medications:  Current Outpatient Medications  Medication Sig Dispense Refill  . aspirin EC 81 MG tablet Take 1 tablet (81 mg total) by mouth daily. 90 tablet 3  . Blood Glucose Monitoring Suppl (FREESTYLE LITE) DEVI     . FREESTYLE LITE test strip     . hydrochlorothiazide (MICROZIDE) 12.5 MG capsule Take 1 capsule (12.5 mg total) by mouth daily. 30 capsule 6  . ibuprofen (ADVIL,MOTRIN) 200 MG tablet Take 800 mg by mouth every 8 (eight) hours as needed for moderate pain.    Marland Kitchen insulin glargine (LANTUS) 100 UNIT/ML injection Inject 12-15 Units into the skin daily as needed (if blood sugar is above 200).    . metFORMIN (GLUCOPHAGE-XR) 500 MG 24 hr tablet Take 500 mg by mouth 2 (two) times daily.    . metoprolol succinate (TOPROL-XL) 50 MG 24 hr tablet TAKE 1 TABLET BY MOUTH DAILY 90 tablet 8  . Multiple Vitamin (MULTIVITAMIN WITH MINERALS) TABS tablet Take 1 tablet by mouth at bedtime.    . NONFORMULARY OR COMPOUNDED ITEM Trimix (30/1/10)-(Pap/Phent/PGE) Dosage: Inject 1.0 cc per injection Prefilled syringe : 10 syringes Qty10 [place] [phone] Fax [phone] 10 each 6   . omeprazole (PRILOSEC) 20 MG capsule     . rosuvastatin (CRESTOR) 40 MG tablet Take 1 tablet (40 mg total) by mouth at bedtime. 90 tablet 3  . sildenafil (VIAGRA) 100 MG tablet Take 1 tablet (100 mg total) by mouth daily as needed for erectile dysfunction. 30 tablet 6  . traZODone (DESYREL) 50 MG tablet Take 50 mg by mouth at bedtime.    Marland Kitchen UNIFINE PENTIPS 29G X 12MM MISC      No current facility-administered medications for this visit.    Patient confirms/reports the following allergies:  No Known Allergies  No orders of the defined types were placed in this encounter.   AUTHORIZATION INFORMATION Primary Insurance: 1D#: Group #:  Secondary Insurance: 1D#: Group #:  SCHEDULE INFORMATION: Date:  Time: Location:

## 2019-07-22 ENCOUNTER — Other Ambulatory Visit: Payer: Self-pay

## 2019-07-22 ENCOUNTER — Other Ambulatory Visit: Payer: Self-pay | Admitting: Internal Medicine

## 2019-07-22 MED ORDER — OMEPRAZOLE 20 MG PO CPDR: 20.0000 mg | DELAYED_RELEASE_CAPSULE | Freq: Every day | ORAL | 3 refills | Status: DC

## 2019-08-08 ENCOUNTER — Other Ambulatory Visit: Payer: Self-pay | Admitting: *Deleted

## 2019-08-08 MED ORDER — INSULIN GLARGINE 100 UNITS/ML SOLOSTAR PEN: PEN_INJECTOR | SUBCUTANEOUS | 11 refills | Status: DC

## 2019-08-08 MED ORDER — INSULIN GLARGINE 100 UNITS/ML SOLOSTAR PEN: 45.0000 [IU] | PEN_INJECTOR | Freq: Every day | SUBCUTANEOUS | 11 refills | Status: DC

## 2019-08-09 ENCOUNTER — Other Ambulatory Visit: Payer: Self-pay | Admitting: Internal Medicine

## 2019-08-11 ENCOUNTER — Other Ambulatory Visit: Payer: Self-pay | Admitting: *Deleted

## 2019-08-11 ENCOUNTER — Other Ambulatory Visit: Payer: Self-pay | Admitting: Internal Medicine

## 2019-08-11 MED ORDER — INSULIN GLARGINE 100 UNITS/ML SOLOSTAR PEN: PEN_INJECTOR | SUBCUTANEOUS | 11 refills | Status: DC

## 2019-08-11 MED ORDER — BASAGLAR KWIKPEN 100 UNIT/ML ~~LOC~~ SOPN: 45.0000 [IU] | PEN_INJECTOR | Freq: Every day | SUBCUTANEOUS | 6 refills | Status: DC

## 2019-08-11 MED ORDER — UNIFINE PENTIPS 29G X 12MM MISC: 6 refills | Status: DC

## 2019-08-18 ENCOUNTER — Other Ambulatory Visit: Payer: Self-pay

## 2019-08-25 ENCOUNTER — Other Ambulatory Visit
Admission: RE | Admit: 2019-08-25 | Discharge: 2019-08-25 | Disposition: A | Discharge status: Home / Self Care | Payer: No Typology Code available for payment source | Source: Ambulatory Visit | Attending: Gastroenterology | Admitting: Gastroenterology

## 2019-08-25 ENCOUNTER — Other Ambulatory Visit: Payer: Self-pay

## 2019-08-25 DIAGNOSIS — Z20822 Contact with and (suspected) exposure to covid-19: Secondary | ICD-10-CM | POA: Insufficient documentation

## 2019-08-25 DIAGNOSIS — Z01812 Encounter for preprocedural laboratory examination: Secondary | ICD-10-CM | POA: Diagnosis not present

## 2019-08-26 ENCOUNTER — Encounter: Payer: Self-pay | Admitting: Gastroenterology

## 2019-08-26 LAB — SARS CORONAVIRUS 2 (TAT 6-24 HRS): SARS Coronavirus 2: NEGATIVE

## 2019-08-29 ENCOUNTER — Ambulatory Visit: Payer: No Typology Code available for payment source | Admitting: Anesthesiology

## 2019-08-29 ENCOUNTER — Encounter
Admission: RE | Disposition: A | Discharge status: Home / Self Care | Payer: Self-pay | Source: Home / Self Care | Attending: Gastroenterology

## 2019-08-29 ENCOUNTER — Other Ambulatory Visit: Payer: Self-pay

## 2019-08-29 ENCOUNTER — Encounter: Payer: Self-pay | Admitting: Gastroenterology

## 2019-08-29 ENCOUNTER — Ambulatory Visit
Admission: RE | Admit: 2019-08-29 | Discharge: 2019-08-29 | Disposition: A | Discharge status: Home / Self Care | Payer: No Typology Code available for payment source | Attending: Gastroenterology | Admitting: Gastroenterology

## 2019-08-29 DIAGNOSIS — Z79899 Other long term (current) drug therapy: Secondary | ICD-10-CM | POA: Diagnosis not present

## 2019-08-29 DIAGNOSIS — Z8249 Family history of ischemic heart disease and other diseases of the circulatory system: Secondary | ICD-10-CM | POA: Insufficient documentation

## 2019-08-29 DIAGNOSIS — Z1211 Encounter for screening for malignant neoplasm of colon: Secondary | ICD-10-CM | POA: Diagnosis not present

## 2019-08-29 DIAGNOSIS — Z794 Long term (current) use of insulin: Secondary | ICD-10-CM | POA: Insufficient documentation

## 2019-08-29 DIAGNOSIS — K219 Gastro-esophageal reflux disease without esophagitis: Secondary | ICD-10-CM | POA: Diagnosis not present

## 2019-08-29 DIAGNOSIS — Z860100 Personal history of colon polyps, unspecified: Secondary | ICD-10-CM

## 2019-08-29 DIAGNOSIS — N529 Male erectile dysfunction, unspecified: Secondary | ICD-10-CM | POA: Insufficient documentation

## 2019-08-29 DIAGNOSIS — Z87891 Personal history of nicotine dependence: Secondary | ICD-10-CM | POA: Insufficient documentation

## 2019-08-29 DIAGNOSIS — E119 Type 2 diabetes mellitus without complications: Secondary | ICD-10-CM | POA: Insufficient documentation

## 2019-08-29 DIAGNOSIS — I10 Essential (primary) hypertension: Secondary | ICD-10-CM | POA: Diagnosis not present

## 2019-08-29 DIAGNOSIS — Z7982 Long term (current) use of aspirin: Secondary | ICD-10-CM | POA: Diagnosis not present

## 2019-08-29 HISTORY — PX: COLONOSCOPY WITH PROPOFOL: SHX5780

## 2019-08-29 LAB — GLUCOSE, CAPILLARY: Glucose-Capillary: 171 mg/dL — ABNORMAL HIGH (ref 70–99)

## 2019-08-29 SURGERY — COLONOSCOPY WITH PROPOFOL
Anesthesia: General

## 2019-08-29 MED ORDER — PROPOFOL 10 MG/ML IV BOLUS
INTRAVENOUS | Status: DC | PRN
  Administered 2019-08-29: 100 mg via INTRAVENOUS
  Administered 2019-08-29: 130 ug/kg/min via INTRAVENOUS

## 2019-08-29 MED ORDER — PROPOFOL 10 MG/ML IV BOLUS
INTRAVENOUS | Status: AC
  Filled 2019-08-29: qty 40

## 2019-08-29 MED ORDER — SODIUM CHLORIDE 0.9 % IV SOLN: INTRAVENOUS | Status: DC

## 2019-08-29 NOTE — Op Note (Signed)
[facility] Gastroenterology Patient Name: Curtis Singh Procedure Date: 08/29/2019 8:41 AM MRN: [id number] Account #: [id number] Date of Birth: 04-19-1955 Admit Type: Outpatient Age: 64 Room: [facility] ENDO ROOM 1 Gender: Male Note Status: Finalized Procedure:             Colonoscopy Indications:           Surveillance: Personal history of adenomatous polyps                         on last colonoscopy > 5 years ago Providers:             Lin Landsman MD, MD Referring MD:          Cletis Athens, MD (Referring MD) Medicines:             Monitored Anesthesia Care Complications:         No immediate complications. Estimated blood loss: None. Procedure:             Pre-Anesthesia Assessment:                        - Prior to the procedure, a History and Physical was                         performed, and patient medications and allergies were                         reviewed. The patient is competent. The risks and                         benefits of the procedure and the sedation options and                         risks were discussed with the patient. All questions                         were answered and informed consent was obtained.                         Patient identification and proposed procedure were                         verified by the physician, the nurse, the                         anesthesiologist, the anesthetist and the technician                         in the pre-procedure area in the procedure room in the                         endoscopy suite. Mental Status Examination: alert and                         oriented. Airway Examination: normal oropharyngeal                         airway and neck mobility. Respiratory Examination:  clear to auscultation. CV Examination: normal.                         Prophylactic Antibiotics: The patient does not require                         prophylactic antibiotics. Prior  Anticoagulants: The                         patient has taken no previous anticoagulant or                         antiplatelet agents. ASA Grade Assessment: III - A                         patient with severe systemic disease. After reviewing                         the risks and benefits, the patient was deemed in                         satisfactory condition to undergo the procedure. The                         anesthesia plan was to use monitored anesthesia care                         (MAC). Immediately prior to administration of                         medications, the patient was re-assessed for adequacy                         to receive sedatives. The heart rate, respiratory                         rate, oxygen saturations, blood pressure, adequacy of                         pulmonary ventilation, and response to care were                         monitored throughout the procedure. The physical                         status of the patient was re-assessed after the                         procedure.                        After obtaining informed consent, the colonoscope was                         passed under direct vision. Throughout the procedure,                         the patient's blood pressure, pulse, and oxygen  saturations were monitored continuously. The                         Colonoscope was introduced through the anus and                         advanced to the the cecum, identified by appendiceal                         orifice and ileocecal valve. The colonoscopy was                         performed without difficulty. The patient tolerated                         the procedure well. The quality of the bowel                         preparation was evaluated using the BBPS [facility] Bowel                         Preparation Scale) with scores of: Right Colon = 3,                         Transverse Colon = 3 and Left Colon = 3 (entire mucosa                          seen well with no residual staining, small fragments                         of stool or opaque liquid). The total BBPS score                         equals 9. Findings:      The perianal and digital rectal examinations were normal. Pertinent       negatives include normal sphincter tone and no palpable rectal lesions.      The entire examined colon appeared normal.      The retroflexed view of the distal rectum and anal verge was normal and       showed no anal or rectal abnormalities. Impression:            - The entire examined colon is normal.                        - The distal rectum and anal verge are normal on                         retroflexion view.                        - No specimens collected. Recommendation:        - Discharge patient to home (with escort).                        - Resume previous diet today.                        - Continue present medications.                        -  Repeat colonoscopy in 10 years for screening                         purposes. Procedure Code(s):     --- Professional ---                        E1740, Colorectal cancer screening; colonoscopy on                         individual at high risk Diagnosis Code(s):     --- Professional ---                        Z86.010, Personal history of colonic polyps CPT copyright 2019 American Medical Association. All rights reserved. The codes documented in this report are preliminary and upon coder review may  be revised to meet current compliance requirements. Dr. Ulyess Mort Lin Landsman MD, MD 08/29/2019 9:03:59 AM This report has been signed electronically. Number of Addenda: 0 Note Initiated On: 08/29/2019 8:41 AM Scope Withdrawal Time: 0 hours 10 minutes 9 seconds  Total Procedure Duration: 0 hours 11 minutes 36 seconds  Estimated Blood Loss:  Estimated blood loss: none.      [facility]

## 2019-08-29 NOTE — Anesthesia Preprocedure Evaluation (Signed)
Anesthesia Evaluation  Patient identified by MRN, date of birth, ID band Patient awake    Reviewed: Allergy & Precautions, H&P , NPO status , Patient's Chart, lab work & pertinent test results, reviewed documented beta blocker date and time   Airway Mallampati: II   Neck ROM: full    Dental  (+) Teeth Intact   Pulmonary neg pulmonary ROS, former smoker,    Pulmonary exam normal        Cardiovascular Exercise Tolerance: Good hypertension, On Medications negative cardio ROS Normal cardiovascular exam Rhythm:regular Rate:Normal     Neuro/Psych negative neurological ROS  negative psych ROS   GI/Hepatic Neg liver ROS, GERD  Medicated,  Endo/Other  negative endocrine ROSdiabetes, Well Controlled  Renal/GU negative Renal ROS  negative genitourinary   Musculoskeletal   Abdominal   Peds  Hematology negative hematology ROS (+)   Anesthesia Other Findings Past Medical History: No date: 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 No date: Hypertension No date: Seasonal allergies Past Surgical History: 02/25/2018: [place]; Left     Comment:  Procedure: LEFT ARTHROSCOPIC ROTATOR CUFF REPAIR;               SUBACROMIAL DECOMPRESSION;  Surgeon: Tania Ade,               MD;  Location: [facility];  Service: Orthopedics;                Laterality: Left; 09/03/2016: CATARACT EXTRACTION W/PHACO; Right     Comment:  Procedure: CATARACT EXTRACTION PHACO AND INTRAOCULAR               LENS PLACEMENT ([place]) RIGHT DIABETIC;  Surgeon:               Leandrew Koyanagi, MD;  Location: [place];              Service: Ophthalmology;  Laterality: Right;  DIABETIC 2009: ROTATOR CUFF REPAIR; Right   Reproductive/Obstetrics negative OB ROS                              Anesthesia Physical Anesthesia Plan  ASA: III  Anesthesia Plan: General   Post-op Pain Management:    Induction:   PONV Risk Score and Plan:   Airway Management Planned:   Additional Equipment:   Intra-op Plan:   Post-operative Plan:   Informed Consent: I have reviewed the patients History and Physical, chart, labs and discussed the procedure including the risks, benefits and alternatives for the proposed anesthesia with the patient or authorized representative who has indicated his/her understanding and acceptance.     Dental Advisory Given  Plan Discussed with: CRNA  Anesthesia Plan Comments:         Anesthesia Quick Evaluation

## 2019-08-29 NOTE — H&P (Signed)
Cephas Darby, MD [address]  [place]  [place], [place] [address]  Main: [phone]  Fax: [phone] Pager: [phone]  Primary Care Physician:  Cletis Athens, MD Primary Gastroenterologist:  Dr. Cephas Darby  Pre-Procedure History & Physical: HPI:  Curtis Singh is a 64 y.o. male is here for an colonoscopy.   Past Medical History:  Diagnosis Date  . Diabetes ([place])    type 2 x 14 years  . ED (erectile dysfunction)   . GERD (gastroesophageal reflux disease)   . 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 Hypertension   . Seasonal allergies     Past Surgical History:  Procedure Laterality Date  . ARTHOSCOPIC ROTAOR CUFF REPAIR Left 02/25/2018   Procedure: LEFT ARTHROSCOPIC ROTATOR CUFF REPAIR; SUBACROMIAL DECOMPRESSION;  Surgeon: Tania Ade, MD;  [facility];  Service: Orthopedics;  Laterality: Left;  . CATARACT EXTRACTION W/PHACO Right 09/03/2016   Procedure: CATARACT EXTRACTION PHACO AND INTRAOCULAR LENS PLACEMENT ([place]) RIGHT DIABETIC;  Surgeon: Leandrew Koyanagi, MD;  Location: [place];  Service: Ophthalmology;  Laterality: Right;  DIABETIC  . ROTATOR CUFF REPAIR Right 2009    Prior to Admission medications   Medication Sig Start Date End Date Taking? Authorizing Provider  aspirin EC 81 MG tablet Take 1 tablet (81 mg total) by mouth daily. 02/09/18   Wellington Hampshire, MD  Blood Glucose Monitoring Suppl (FREESTYLE LITE) DEVI  03/05/18   [provider]  glucose blood (FREESTYLE LITE) test strip USE 1 STRIP DAILY 08/09/19   Cletis Athens, MD  hydrochlorothiazide (MICROZIDE) 12.5 MG capsule Take 1 capsule (12.5 mg total) by mouth daily. 02/23/19 05/24/19  Marrianne Mood D, PA-C  ibuprofen (ADVIL,MOTRIN) 200 MG tablet Take 800 mg by mouth every 8 (eight) hours as needed for moderate pain.    [provider]  Insulin Glargine (BASAGLAR KWIKPEN) 100 UNIT/ML Inject 0.45 mLs (45 Units total) into the skin daily. 08/11/19    Cletis Athens, MD  metFORMIN (GLUCOPHAGE-XR) 500 MG 24 hr tablet Take 500 mg by mouth 2 (two) times daily.    [provider]  metoprolol succinate (TOPROL-XL) 50 MG 24 hr tablet Take 1 tablet (50 mg total) by mouth daily. Take with or immediately following a meal. 07/12/19   Cletis Athens, MD  Multiple Vitamin (MULTIVITAMIN WITH MINERALS) TABS tablet Take 1 tablet by mouth at bedtime.    [provider]  NONFORMULARY OR COMPOUNDED ITEM Trimix (30/1/10)-(Pap/Phent/PGE) Dosage: Inject 1.0 cc per injection Prefilled syringe : 10 syringes Qty10 [place] [phone] Fax [phone] 07/08/18   Zara Council A, PA-C  omeprazole (PRILOSEC) 20 MG capsule Take 1 capsule (20 mg total) by mouth daily. 07/22/19 10/20/19  Cletis Athens, MD  rosuvastatin (CRESTOR) 40 MG tablet Take 1 tablet (40 mg total) by mouth at bedtime. 03/11/19 06/09/19  Wellington Hampshire, MD  sildenafil (VIAGRA) 100 MG tablet Take 1 tablet (100 mg total) by mouth daily as needed for erectile dysfunction. 08/09/18   Zara Council A, PA-C  traZODone (DESYREL) 50 MG tablet Take 1 tablet (50 mg total) by mouth at bedtime. 07/12/19   Cletis Athens, MD  UNIFINE PENTIPS 29G X 12MM MISC Use to inject insulin daily 08/11/19   Cletis Athens, MD    Allergies as of 07/08/2019  . (No Known Allergies)    Family History  Problem Relation Age of Onset  . Heart attack Father   . Heart attack Maternal Uncle   .  Heart attack Maternal Uncle   . Heart attack Maternal Uncle   . Heart attack Maternal Uncle   . Colon cancer Neg Hx   . Esophageal cancer Neg Hx   . Rectal cancer Neg Hx   . Stomach cancer Neg Hx   . Kidney cancer Neg Hx   . Kidney disease Neg Hx   . Prostate cancer Neg Hx   . Urolithiasis Neg Hx     Social History   Socioeconomic History  . Marital status: Divorced    Spouse name: Not on file  . Number of children: Not on file  . Years of education: Not on file  . Highest education level:  Not on file  Occupational History  . Not on file  Tobacco Use  . Smoking status: Former Smoker    Quit date: 01/06/1982    Years since quitting: 37.6  . Smokeless tobacco: Never Used  Vaping Use  . Vaping Use: Never used  Substance and Sexual Activity  . Alcohol use: Yes    Alcohol/week: 24.0 standard drinks    Types: 24 Cans of beer per week    Comment: 1 case on weekend  . Drug use: No  . Sexual activity: Not on file  Other Topics Concern  . Not on file  Social History Narrative  . Not on file   Social Determinants of Health   Financial Resource Strain:   . Difficulty of Paying Living Expenses: Not on file  Food Insecurity:   . Worried About Charity fundraiser in the Last Year: Not on file  . Ran Out of Food in the Last Year: Not on file  Transportation Needs:   . Lack of Transportation (Medical): Not on file  . Lack of Transportation (Non-Medical): Not on file  Physical Activity:   . Days of Exercise per Week: Not on file  . Minutes of Exercise per Session: Not on file  Stress:   . Feeling of Stress : Not on file  Social Connections:   . Frequency of Communication with Friends and Family: Not on file  . Frequency of Social Gatherings with Friends and Family: Not on file  . Attends Religious Services: Not on file  . Active Member of Clubs or Organizations: Not on file  . Attends Archivist Meetings: Not on file  . Marital Status: Not on file  Intimate Partner Violence:   . Fear of Current or Ex-Partner: Not on file  . Emotionally Abused: Not on file  . Physically Abused: Not on file  . Sexually Abused: Not on file    Review of Systems: See HPI, otherwise negative ROS  Physical Exam: BP (!) 148/78   Pulse 67   Temp 97.7 F (36.5 C) (Temporal)   Resp 16   Ht 5\' 11"  (1.803 m)   Wt 88.5 kg   SpO2 98%   BMI 27.20 kg/m  General:   Alert,  pleasant and cooperative in NAD Head:  Normocephalic and atraumatic. Neck:  Supple; no masses or  thyromegaly. Lungs:  Clear throughout to auscultation.    Heart:  Regular rate and rhythm. Abdomen:  Soft, nontender and nondistended. Normal bowel sounds, without guarding, and without rebound.   Neurologic:  Alert and  oriented x4;  grossly normal neurologically.  Impression/Plan: QUINTERIOUS WALRAVEN is here for a colonoscopy to be performed for h/o colon polyps  Risks, benefits, limitations, and alternatives regarding  colonoscopy have been reviewed with the patient.  Questions have been  answered.  All parties agreeable.   Sherri Sear, MD  08/29/2019, 8:35 AM

## 2019-08-29 NOTE — Progress Notes (Signed)
   08/29/19 0750  Clinical Encounter Type  Visited With Patient and family together  Visit Type Initial  Referral From Chaplain  Consult/Referral To Chaplain  While rounding SDS waiting area, chaplain spoke with patient and his mother. Patient want to get colostomy over so he could eat. Patient told chaplain that each time he has had this procedure his mother has brought him. The are going to eat at [employer] when he is done. He said all he wanted was something to drink this morning. Chaplain wished patient and his mother well and told them to enjoy their breakfast later today.

## 2019-08-29 NOTE — Transfer of Care (Signed)
Immediate Anesthesia Transfer of Care Note  Patient: Curtis Singh  Procedure(s) Performed: COLONOSCOPY WITH PROPOFOL (N/A )  Patient Location: PACU  Anesthesia Type:General  Level of Consciousness: awake, alert , oriented, drowsy and patient cooperative  Airway & Oxygen Therapy: Patient Spontanous Breathing  Post-op Assessment: Report given to RN and Post -op Vital signs reviewed and stable  Post vital signs: Reviewed and stable  Last Vitals:  Vitals Value Taken Time  BP 98/61 08/29/19 0907  Temp 36.3 C 08/29/19 0906  Pulse 70 08/29/19 0907  Resp 15 08/29/19 0907  SpO2 97 % 08/29/19 0907  Vitals shown include unvalidated device data.  Last Pain:  Vitals:   08/29/19 0906  TempSrc: Tympanic  PainSc: Asleep         Complications: No complications documented.

## 2019-08-29 NOTE — Anesthesia Postprocedure Evaluation (Signed)
Anesthesia Post Note  Patient: Curtis Singh  Procedure(s) Performed: COLONOSCOPY WITH PROPOFOL (N/A )  Patient location during evaluation: PACU Anesthesia Type: General Level of consciousness: awake and alert Pain management: pain level controlled Vital Signs Assessment: post-procedure vital signs reviewed and stable Respiratory status: spontaneous breathing, nonlabored ventilation, respiratory function stable and patient connected to nasal cannula oxygen Cardiovascular status: blood pressure returned to baseline and stable Postop Assessment: no apparent nausea or vomiting Anesthetic complications: no   No complications documented.   Last Vitals:  Vitals:   08/29/19 0906 08/29/19 0916  BP: 98/61 130/71  Pulse: 69 61  Resp: 10 17  Temp: (!) 36.3 C   SpO2: 97% 99%    Last Pain:  Vitals:   08/29/19 0916  TempSrc:   PainSc: 0-No pain                 Molli Barrows

## 2019-08-31 ENCOUNTER — Encounter: Payer: Self-pay | Admitting: Gastroenterology

## 2019-08-31 NOTE — Progress Notes (Signed)
   08/29/19 0745  Clinical Encounter Type  Visited With Family  Visit Type Initial  Referral From Chaplain  Consult/Referral To Chaplain  While rounding SDS, chaplain spoke with patient's family member, making sure they were alright while they waited.

## 2019-09-08 ENCOUNTER — Other Ambulatory Visit: Payer: Self-pay | Admitting: Internal Medicine

## 2019-10-28 ENCOUNTER — Other Ambulatory Visit: Payer: Self-pay | Admitting: Ophthalmology

## 2019-11-09 ENCOUNTER — Other Ambulatory Visit: Payer: Self-pay | Admitting: Internal Medicine

## 2019-12-07 DIAGNOSIS — I2510 Atherosclerotic heart disease of native coronary artery without angina pectoris: Secondary | ICD-10-CM

## 2019-12-07 HISTORY — DX: Atherosclerotic heart disease of native coronary artery without angina pectoris: I25.10

## 2019-12-12 ENCOUNTER — Ambulatory Visit (INDEPENDENT_AMBULATORY_CARE_PROVIDER_SITE_OTHER): Payer: No Typology Code available for payment source | Admitting: Internal Medicine

## 2019-12-12 ENCOUNTER — Other Ambulatory Visit: Payer: Self-pay

## 2019-12-12 ENCOUNTER — Encounter: Payer: Self-pay | Admitting: Internal Medicine

## 2019-12-12 VITALS — BP 143/82 | HR 70 | Ht 71.0 in | Wt 210.0 lb

## 2019-12-12 DIAGNOSIS — E782 Mixed hyperlipidemia: Secondary | ICD-10-CM | POA: Diagnosis not present

## 2019-12-12 DIAGNOSIS — Z0000 Encounter for general adult medical examination without abnormal findings: Secondary | ICD-10-CM

## 2019-12-12 DIAGNOSIS — R0789 Other chest pain: Secondary | ICD-10-CM | POA: Diagnosis not present

## 2019-12-12 DIAGNOSIS — E119 Type 2 diabetes mellitus without complications: Secondary | ICD-10-CM | POA: Diagnosis not present

## 2019-12-12 NOTE — Progress Notes (Signed)
Established Patient Office Visit  Subjective:  Patient ID: Curtis Singh, male    DOB: July 05, 1955  Age: 64 y.o. MRN: [id number]  CC:  Chief Complaint  Patient presents with  . chest discomfort    patient states that chest has felt bruised x 1 week, with sob with exertion   . Diabetes    patients blood sugar was 253    HPI  Curtis Singh presents for  evaluation tightness in the chest, he also complains of shortness of breath on exertion.  He describes his chest discomfort to be lightheadedness or tightness in the middle of the chest it regularly, with exertion and goes away with rest.  Denies any history of radiation of pain to the left shoulder or arm.  Past Medical History:  Diagnosis Date  . Diabetes ([place])    type 2 x 14 years  . ED (erectile dysfunction)   . GERD (gastroesophageal reflux disease)   . 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 Hypertension   . Seasonal allergies     Past Surgical History:  Procedure Laterality Date  . ARTHOSCOPIC ROTAOR CUFF REPAIR Left 02/25/2018   Procedure: LEFT ARTHROSCOPIC ROTATOR CUFF REPAIR; SUBACROMIAL DECOMPRESSION;  Surgeon: Tania Ade, MD;  [facility];  Service: Orthopedics;  Laterality: Left;  . CATARACT EXTRACTION W/PHACO Right 09/03/2016   Procedure: CATARACT EXTRACTION PHACO AND INTRAOCULAR LENS PLACEMENT ([place]) RIGHT DIABETIC;  Surgeon: Leandrew Koyanagi, MD;  Location: [place];  Service: Ophthalmology;  Laterality: Right;  DIABETIC  . COLONOSCOPY WITH PROPOFOL N/A 08/29/2019   Procedure: COLONOSCOPY WITH PROPOFOL;  Surgeon: Lin Landsman, MD;  Location: [facility];  Service: Gastroenterology;  Laterality: N/A;  . ROTATOR CUFF REPAIR Right 2009    Family History  Problem Relation Age of Onset  . Heart attack Father   . Heart attack Maternal Uncle   . Heart attack Maternal Uncle   . Heart attack Maternal Uncle   . Heart attack Maternal Uncle   . Colon cancer Neg Hx   .  Esophageal cancer Neg Hx   . Rectal cancer Neg Hx   . Stomach cancer Neg Hx   . Kidney cancer Neg Hx   . Kidney disease Neg Hx   . Prostate cancer Neg Hx   . Urolithiasis Neg Hx     Social History   Socioeconomic History  . Marital status: Divorced    Spouse name: Not on file  . Number of children: Not on file  . Years of education: Not on file  . Highest education level: Not on file  Occupational History  . Not on file  Tobacco Use  . Smoking status: Former Smoker    Quit date: 01/06/1982    Years since quitting: 37.9  . Smokeless tobacco: Never Used  Vaping Use  . Vaping Use: Never used  Substance and Sexual Activity  . Alcohol use: Yes    Alcohol/week: 24.0 standard drinks    Types: 24 Cans of beer per week    Comment: 1 case on weekend  . Drug use: No  . Sexual activity: Not on file  Other Topics Concern  . Not on file  Social History Narrative  . Not on file   Social Determinants of Health   Financial Resource Strain:   . Difficulty of Paying Living Expenses: Not on file  Food Insecurity:   . Worried About Charity fundraiser in the Last Year: Not on file  .  Ran Out of Food in the Last Year: Not on file  Transportation Needs:   . Lack of Transportation (Medical): Not on file  . Lack of Transportation (Non-Medical): Not on file  Physical Activity:   . Days of Exercise per Week: Not on file  . Minutes of Exercise per Session: Not on file  Stress:   . Feeling of Stress : Not on file  Social Connections:   . Frequency of Communication with Friends and Family: Not on file  . Frequency of Social Gatherings with Friends and Family: Not on file  . Attends Religious Services: Not on file  . Active Member of Clubs or Organizations: Not on file  . Attends Archivist Meetings: Not on file  . Marital Status: Not on file  Intimate Partner Violence:   . Fear of Current or Ex-Partner: Not on file  . Emotionally Abused: Not on file  . Physically Abused: Not  on file  . Sexually Abused: Not on file     Current Outpatient Medications:  .  aspirin EC 81 MG tablet, Take 1 tablet (81 mg total) by mouth daily., Disp: 90 tablet, Rfl: 3 .  Blood Glucose Monitoring Suppl (FREESTYLE LITE) DEVI, , Disp: , Rfl:  .  glucose blood (FREESTYLE LITE) test strip, USE 1 STRIP DAILY, Disp: 100 strip, Rfl: 12 .  ibuprofen (ADVIL,MOTRIN) 200 MG tablet, Take 800 mg by mouth every 8 (eight) hours as needed for moderate pain., Disp: , Rfl:  .  Insulin Glargine (BASAGLAR KWIKPEN) 100 UNIT/ML, Inject 0.45 mLs (45 Units total) into the skin daily., Disp: 15 mL, Rfl: 6 .  metFORMIN (GLUCOPHAGE-XR) 500 MG 24 hr tablet, Take 500 mg by mouth 2 (two) times daily., Disp: , Rfl:  .  metoprolol succinate (TOPROL-XL) 50 MG 24 hr tablet, Take 1 tablet (50 mg total) by mouth daily. Take with or immediately following a meal., Disp: 90 tablet, Rfl: 3 .  Multiple Vitamin (MULTIVITAMIN WITH MINERALS) TABS tablet, Take 1 tablet by mouth at bedtime., Disp: , Rfl:  .  NONFORMULARY OR COMPOUNDED ITEM, Trimix (30/1/10)-(Pap/Phent/PGE) Dosage: Inject 1.0 cc per injection Prefilled syringe : 10 syringes Qty10 [place] [phone] Fax [phone], Disp: 10 each, Rfl: 6 .  sildenafil (VIAGRA) 100 MG tablet, Take 1 tablet (100 mg total) by mouth daily as needed for erectile dysfunction., Disp: 30 tablet, Rfl: 6 .  traZODone (DESYREL) 50 MG tablet, TAKE 1 TABLET BY MOUTH AT BEDTIME, Disp: 30 tablet, Rfl: 1 .  UNIFINE PENTIPS 29G X 12MM MISC, Use to inject insulin daily, Disp: 30 each, Rfl: 6 .  hydrochlorothiazide (MICROZIDE) 12.5 MG capsule, Take 1 capsule (12.5 mg total) by mouth daily., Disp: 30 capsule, Rfl: 6 .  omeprazole (PRILOSEC) 20 MG capsule, Take 1 capsule (20 mg total) by mouth daily., Disp: 90 capsule, Rfl: 3 .  rosuvastatin (CRESTOR) 40 MG tablet, Take 1 tablet (40 mg total) by mouth at bedtime., Disp: 90 tablet, Rfl: 3   Allergies  Allergen Reactions  . Latex       ROS Review of Systems  Constitutional: Negative.   HENT: Negative.   Eyes: Negative.   Respiratory: Positive for shortness of breath.   Cardiovascular: Positive for chest pain.  Gastrointestinal: Negative.   Endocrine: Negative.   Genitourinary: Negative.   Musculoskeletal: Negative.   Skin: Negative.   Allergic/Immunologic: Negative.   Neurological: Negative.   Hematological: Negative.   Psychiatric/Behavioral: Negative.   All other systems reviewed and are negative.  Objective:    Physical Exam Vitals reviewed.  Constitutional:      Appearance: Normal appearance.  HENT:     Mouth/Throat:     Mouth: Mucous membranes are moist.  Eyes:     Pupils: Pupils are equal, round, and reactive to light.  Neck:     Vascular: No carotid bruit.  Cardiovascular:     Rate and Rhythm: Normal rate and regular rhythm.     Pulses: Normal pulses.     Heart sounds: Normal heart sounds.  Pulmonary:     Effort: Pulmonary effort is normal.     Breath sounds: Normal breath sounds.  Abdominal:     General: Bowel sounds are normal.     Palpations: Abdomen is soft. There is no hepatomegaly, splenomegaly or mass.     Tenderness: There is no abdominal tenderness.     Hernia: No hernia is present.  Musculoskeletal:     Cervical back: Neck supple.     Right lower leg: No edema.     Left lower leg: No edema.  Skin:    Findings: No rash.  Neurological:     Mental Status: He is alert and oriented to person, place, and time.     Motor: No weakness.  Psychiatric:        Mood and Affect: Mood normal.        Behavior: Behavior normal.     BP (!) 143/82   Pulse 70   Ht 5\' 11"  (1.803 m)   Wt 210 lb (95.3 kg)   BMI 29.29 kg/m  Wt Readings from Last 3 Encounters:  12/12/19 210 lb (95.3 kg)  08/29/19 195 lb (88.5 kg)  07/01/19 200 lb 11.2 oz (91 kg)     Health Maintenance Due  Topic Date Due  . Hepatitis C Screening  Never done  . PNEUMOCOCCAL POLYSACCHARIDE VACCINE AGE 57-64  HIGH RISK  Never done  . FOOT EXAM  Never done  . COVID-19 Vaccine (1) Never done  . HIV Screening  Never done  . TETANUS/TDAP  Never done  . INFLUENZA VACCINE  08/07/2019    There are no preventive care reminders to display for this patient.  Lab Results  Component Value Date   TSH 1.311 07/04/2019   Lab Results  Component Value Date   WBC 6.3 07/04/2019   HGB 15.0 07/04/2019   HCT 44.6 07/04/2019   MCV 90.3 07/04/2019   PLT 233 07/04/2019   Lab Results  Component Value Date   NA 136 07/04/2019   K 4.8 07/04/2019   CO2 26 07/04/2019   GLUCOSE 289 (H) 07/04/2019   BUN 24 (H) 07/04/2019   CREATININE 0.95 07/04/2019   BILITOT 0.8 03/02/2019   ALKPHOS 60 03/02/2019   AST 19 03/02/2019   ALT 30 03/02/2019   PROT 7.1 03/02/2019   ALBUMIN 4.4 03/02/2019   CALCIUM 8.9 07/04/2019   ANIONGAP 9 07/04/2019   Lab Results  Component Value Date   CHOL 182 07/04/2019   Lab Results  Component Value Date   HDL 69 07/04/2019   Lab Results  Component Value Date   LDLCALC 66 07/04/2019   Lab Results  Component Value Date   TRIG 233 (H) 07/04/2019   Lab Results  Component Value Date   CHOLHDL 2.6 07/04/2019   Lab Results  Component Value Date   HGBA1C 11.0 (H) 07/04/2019      Assessment & Plan:   Problem List Items Addressed This Visit  Endocrine   Diabetes [facility])    Patient diabetes is out of control patient takes his insulin as needed basis.  I told him to take 30 units of insulin every day in the morning.  And then if is not working then will increase it he was also advised to stop drinking alcohol completely.        Other   Hyperlipemia    Patient take rosuvastatin 40 mg daily to control the lipids.  He has to follow his diet to control the elevated triglyceride.      Routine adult health maintenance    Advised Covid shot booster if not done and get a flu shot.  If he has not already received it      Chest discomfort - Primary    Patient  complains of chest discomfort in the middle of the chest which comes with exertion.  I will make an appointment for him to see Dr. Fletcher Anon.  In the meantime tomorrow we will do a limited stress test      Relevant Orders   EKG 12-Lead    Report of the electrocardiogram.  Electrocardiogram revealed normal sinus rhythm with ST-T abnormalities suggestive of ischemia.  No orders of the defined types were placed in this encounter.   Follow-up: No follow-ups on file.    Cletis Athens, MD

## 2019-12-12 NOTE — Assessment & Plan Note (Signed)
Patient take rosuvastatin 40 mg daily to control the lipids.  He has to follow his diet to control the elevated triglyceride.

## 2019-12-12 NOTE — Assessment & Plan Note (Signed)
Patient complains of chest discomfort in the middle of the chest which comes with exertion.  I will make an appointment for him to see Dr. Fletcher Anon.  In the meantime tomorrow we will do a limited stress test

## 2019-12-12 NOTE — Assessment & Plan Note (Signed)
Advised Covid shot booster if not done and get a flu shot.  If he has not already received it

## 2019-12-12 NOTE — Assessment & Plan Note (Signed)
Patient diabetes is out of control patient takes his insulin as needed basis.  I told him to take 30 units of insulin every day in the morning.  And then if is not working then will increase it he was also advised to stop drinking alcohol completely.

## 2019-12-15 ENCOUNTER — Other Ambulatory Visit
Admission: RE | Admit: 2019-12-15 | Discharge: 2019-12-15 | Disposition: A | Discharge status: Home / Self Care | Payer: No Typology Code available for payment source | Source: Ambulatory Visit | Attending: Cardiovascular Disease | Admitting: Cardiovascular Disease

## 2019-12-15 ENCOUNTER — Encounter: Payer: Self-pay | Admitting: Cardiovascular Disease

## 2019-12-15 ENCOUNTER — Ambulatory Visit: Payer: No Typology Code available for payment source | Admitting: Cardiovascular Disease

## 2019-12-15 ENCOUNTER — Telehealth: Payer: Self-pay

## 2019-12-15 ENCOUNTER — Other Ambulatory Visit: Payer: Self-pay

## 2019-12-15 VITALS — BP 140/70 | HR 66 | Ht 71.0 in | Wt 211.1 lb

## 2019-12-15 DIAGNOSIS — I700 Atherosclerosis of aorta: Secondary | ICD-10-CM | POA: Diagnosis not present

## 2019-12-15 DIAGNOSIS — E785 Hyperlipidemia, unspecified: Secondary | ICD-10-CM | POA: Diagnosis not present

## 2019-12-15 DIAGNOSIS — I200 Unstable angina: Secondary | ICD-10-CM | POA: Insufficient documentation

## 2019-12-15 DIAGNOSIS — I10 Essential (primary) hypertension: Secondary | ICD-10-CM | POA: Diagnosis not present

## 2019-12-15 LAB — CBC WITH DIFFERENTIAL/PLATELET
Abs Immature Granulocytes: 0.07 10*3/uL (ref 0.00–0.07)
Basophils Absolute: 0.1 10*3/uL (ref 0.0–0.1)
Basophils Relative: 1 %
Eosinophils Absolute: 0.2 10*3/uL (ref 0.0–0.5)
Eosinophils Relative: 3 %
HCT: 43.7 % (ref 39.0–52.0)
Hemoglobin: 14.8 g/dL (ref 13.0–17.0)
Immature Granulocytes: 1 %
Lymphocytes Relative: 36 %
Lymphs Abs: 2.4 10*3/uL (ref 0.7–4.0)
MCH: 31 pg (ref 26.0–34.0)
MCHC: 33.9 g/dL (ref 30.0–36.0)
MCV: 91.6 fL (ref 80.0–100.0)
Monocytes Absolute: 0.5 10*3/uL (ref 0.1–1.0)
Monocytes Relative: 8 %
Neutro Abs: 3.5 10*3/uL (ref 1.7–7.7)
Neutrophils Relative %: 51 %
Platelets: 269 10*3/uL (ref 150–400)
RBC: 4.77 MIL/uL (ref 4.22–5.81)
RDW: 12.1 % (ref 11.5–15.5)
WBC: 6.7 10*3/uL (ref 4.0–10.5)
nRBC: 0 % (ref 0.0–0.2)

## 2019-12-15 LAB — BASIC METABOLIC PANEL
Anion gap: 8 (ref 5–15)
BUN: 15 mg/dL (ref 8–23)
CO2: 25 mmol/L (ref 22–32)
Calcium: 9.7 mg/dL (ref 8.9–10.3)
Chloride: 105 mmol/L (ref 98–111)
Creatinine, Ser: 0.79 mg/dL (ref 0.61–1.24)
GFR, Estimated: >60 mL/min (ref >60)
Glucose, Bld: 223 mg/dL — ABNORMAL HIGH (ref 70–99)
Potassium: 4 mmol/L (ref 3.5–5.1)
Sodium: 138 mmol/L (ref 135–145)

## 2019-12-15 LAB — TROPONIN I (HIGH SENSITIVITY): Troponin I (High Sensitivity): 30 ng/L — ABNORMAL HIGH (ref <18)

## 2019-12-15 NOTE — Progress Notes (Signed)
Cardiology Office Note   Date:  12/15/2019   ID:  Curtis Singh, DOB [birth year], MRN [id number]  PCP:  Cletis Athens, MD  Cardiologist:   [name], MD  Chief Complaint  Patient presents with  . Other    C/o chest pain. Meds reviewed verbally with pt.      History of Present Illness: Curtis Singh is a 64 y.o. male who is here today for urgent evaluation of chest pain.  He has known history of diabetes mellitus on insulin, hyperlipidemia, essential hypertension, aortic atherosclerosis and strong family history of coronary artery disease. He is not a smoker. He had a Lexiscan Myoview in February of this year which showed no evidence of ischemia with normal ejection fraction. Over the last 2 to 3 weeks, he experienced substernal chest pain and tightness with happens with any kind of exertion.  The pain lasts for about 15 minutes and subsides with resting.  It is associated with shortness of breath and fatigue.  Today, he reports chest pain at rest but it is very mild overall.  No palpitations, dizziness or syncope.  No previous similar symptoms.  No previous history of cardiac catheterization.  He reports that his diabetes has been uncontrolled.  He thinks it might be related to receiving the COVID-19 vaccine.  Nonetheless, he did gain about 15 pounds in the last year.   Past Medical History:  Diagnosis Date  . Diabetes ([place])    type 2 x 14 years  . ED (erectile dysfunction)   . GERD (gastroesophageal reflux disease)   . 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 Hypertension   . Seasonal allergies     Past Surgical History:  Procedure Laterality Date  . ARTHOSCOPIC ROTAOR CUFF REPAIR Left 02/25/2018   Procedure: LEFT ARTHROSCOPIC ROTATOR CUFF REPAIR; SUBACROMIAL DECOMPRESSION;  Surgeon: Tania Ade, MD;  Location: [facility];  Service: Orthopedics;  Laterality: Left;  . CATARACT EXTRACTION W/PHACO Right 09/03/2016   Procedure: CATARACT EXTRACTION PHACO AND  INTRAOCULAR LENS PLACEMENT ([place]) RIGHT DIABETIC;  Surgeon: Leandrew Koyanagi, MD;  Location: [place];  Service: Ophthalmology;  Laterality: Right;  DIABETIC  . COLONOSCOPY WITH PROPOFOL N/A 08/29/2019   Procedure: COLONOSCOPY WITH PROPOFOL;  Surgeon: Lin Landsman, MD;  Location: [facility] ENDOSCOPY;  Service: Gastroenterology;  Laterality: N/A;  . ROTATOR CUFF REPAIR Right 2009     Current Outpatient Medications  Medication Sig Dispense Refill  . aspirin EC 81 MG tablet Take 1 tablet (81 mg total) by mouth daily. 90 tablet 3  . Blood Glucose Monitoring Suppl (FREESTYLE LITE) DEVI     . glucose blood (FREESTYLE LITE) test strip USE 1 STRIP DAILY 100 strip 12  . ibuprofen (ADVIL,MOTRIN) 200 MG tablet Take 800 mg by mouth every 8 (eight) hours as needed for moderate pain.    . Insulin Glargine (BASAGLAR KWIKPEN) 100 UNIT/ML Inject 30 Units into the skin daily.    . metFORMIN (GLUCOPHAGE-XR) 500 MG 24 hr tablet Take 500 mg by mouth 2 (two) times daily.    . metoprolol succinate (TOPROL-XL) 50 MG 24 hr tablet Take 1 tablet (50 mg total) by mouth daily. Take with or immediately following a meal. 90 tablet 3  . Multiple Vitamin (MULTIVITAMIN WITH MINERALS) TABS tablet Take 1 tablet by mouth at bedtime.    . NONFORMULARY OR COMPOUNDED ITEM Trimix (30/1/10)-(Pap/Phent/PGE) Dosage: Inject 1.0 cc per injection Prefilled syringe : 10 syringes Qty10 [place] [phone] Fax  [phone] 10 each 6  . omeprazole (PRILOSEC) 20 MG capsule Take 1 capsule (20 mg total) by mouth daily. 90 capsule 3  . rosuvastatin (CRESTOR) 40 MG tablet Take 1 tablet (40 mg total) by mouth at bedtime. 90 tablet 3  . sildenafil (VIAGRA) 100 MG tablet Take 1 tablet (100 mg total) by mouth daily as needed for erectile dysfunction. 30 tablet 6  . traZODone (DESYREL) 50 MG tablet TAKE 1 TABLET BY MOUTH AT BEDTIME 30 tablet 1  . UNIFINE PENTIPS 29G X 12MM MISC Use to inject insulin daily 30  each 6   No current facility-administered medications for this visit.    Allergies:   Latex    Social History:  The patient  reports that he quit smoking about 37 years ago. He has never used smokeless tobacco. He reports current alcohol use of about 24.0 standard drinks of alcohol per week. He reports that he does not use drugs.   Family History:  The patient's family history includes Heart attack in his father, maternal uncle, maternal uncle, maternal uncle, and maternal uncle.    ROS:  Please see the history of present illness.   Otherwise, review of systems are positive for none.   All other systems are reviewed and negative.    PHYSICAL EXAM: VS:  BP 140/70 (BP Location: Left Arm, Patient Position: Sitting, Cuff Size: Normal)   Pulse 66   Ht 5\' 11"  (1.803 m)   Wt 211 lb 2 oz (95.8 kg)   SpO2 98%   BMI 29.45 kg/m  , BMI Body mass index is 29.45 kg/m. GEN: Well nourished, well developed, in no acute distress  HEENT: normal  Neck: no JVD, carotid bruits, or masses Cardiac: RRR; no  rubs, or gallops,no edema .  1/ 6 systolic murmur in the aortic area Respiratory:  clear to auscultation bilaterally, normal work of breathing GI: soft, nontender, nondistended, + BS MS: no deformity or atrophy  Skin: warm and dry, no rash Neuro:  Strength and sensation are intact Psych: euthymic mood, full affect Radial pulses normal.  EKG:  EKG is ordered today. The ekg ordered today demonstrates normal sinus rhythm with a PVC.  Nonspecific ST changes.   Recent Labs: 03/02/2019: ALT 30; Magnesium 2.0 07/04/2019: BUN 24; Creatinine, Ser 0.95; Hemoglobin 15.0; Platelets 233; Potassium 4.8; Sodium 136; TSH 1.311    Lipid Panel    Component Value Date/Time   CHOL 182 07/04/2019 0823   CHOL 239 (H) 01/20/2014 0729   TRIG 233 (H) 07/04/2019 0823   TRIG 171 01/20/2014 0729   HDL 69 07/04/2019 0823   HDL 48 01/20/2014 0729   CHOLHDL 2.6 07/04/2019 0823   VLDL 47 (H) 07/04/2019 0823   VLDL  34 01/20/2014 0729   LDLCALC 66 07/04/2019 0823   LDLCALC 157 (H) 01/20/2014 0729      Wt Readings from Last 3 Encounters:  12/15/19 211 lb 2 oz (95.8 kg)  12/12/19 210 lb (95.3 kg)  08/29/19 195 lb (88.5 kg)        ASSESSMENT AND PLAN:  1.  Unstable angina: The patient reports recent symptoms of exertional chest pain and dyspnea that progressed to rest pain.  This is very concerning especially with his multiple risk factors.  Fortunately, his EKG does not show any significant ST deviation.  I am going to obtain stat troponin.  If positive, the patient will be directed to the emergency room for admission and inpatient work-up. Otherwise, we will plan on doing  an urgent left heart catheterization possible PCI tomorrow morning at [facility].  I discussed the procedure in details as well as risk and benefits.  2.  Hyperlipidemia: Previous LDL was greater than 150.  However, most recent LDL in June was 66 since he has been on rosuvastatin 40 mg daily.  3.  Essential hypertension: Blood pressures is mildly elevated.  Will consider switching metoprolol to carvedilol especially that he is diabetic.  4.  Diabetes mellitus: Does not seem to be controlled.  Consider adding an SGLT2 inhibitor.    Orders Placed This Encounter  Procedures  . Basic metabolic panel  . CBC w/Diff  . Troponin T  . EKG 12-Lead     Disposition:   FU with me in 2 weeks.  Signed, [name], MD 12/15/2019 2:06 PM    [place]

## 2019-12-15 NOTE — H&P (View-Only) (Signed)
Cardiology Office Note   Date:  12/15/2019   ID:  Curtis Singh, DOB July 12, 1955, MRN [id number]  PCP:  Cletis Athens, MD  Cardiologist:   [name], MD  Chief Complaint  Patient presents with  . Other    C/o chest pain. Meds reviewed verbally with pt.      History of Present Illness: Curtis Singh is a 64 y.o. male who is here today for urgent evaluation of chest pain.  He has known history of diabetes mellitus on insulin, hyperlipidemia, essential hypertension, aortic atherosclerosis and strong family history of coronary artery disease. He is not a smoker. He had a Lexiscan Myoview in February of this year which showed no evidence of ischemia with normal ejection fraction. Over the last 2 to 3 weeks, he experienced substernal chest pain and tightness with happens with any kind of exertion.  The pain lasts for about 15 minutes and subsides with resting.  It is associated with shortness of breath and fatigue.  Today, he reports chest pain at rest but it is very mild overall.  No palpitations, dizziness or syncope.  No previous similar symptoms.  No previous history of cardiac catheterization.  He reports that his diabetes has been uncontrolled.  He thinks it might be related to receiving the COVID-19 vaccine.  Nonetheless, he did gain about 15 pounds in the last year.   Past Medical History:  Diagnosis Date  . Diabetes ([place])    type 2 x 14 years  . ED (erectile dysfunction)   . GERD (gastroesophageal reflux disease)   . 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 Hypertension   . Seasonal allergies     Past Surgical History:  Procedure Laterality Date  . ARTHOSCOPIC ROTAOR CUFF REPAIR Left 02/25/2018   Procedure: LEFT ARTHROSCOPIC ROTATOR CUFF REPAIR; SUBACROMIAL DECOMPRESSION;  Surgeon: Tania Ade, MD;  [facility];  Service: Orthopedics;  Laterality: Left;  . CATARACT EXTRACTION W/PHACO Right 09/03/2016   Procedure: CATARACT EXTRACTION PHACO AND  INTRAOCULAR LENS PLACEMENT ([place]) RIGHT DIABETIC;  Surgeon: Leandrew Koyanagi, MD;  Location: [place];  Service: Ophthalmology;  Laterality: Right;  DIABETIC  . COLONOSCOPY WITH PROPOFOL N/A 08/29/2019   Procedure: COLONOSCOPY WITH PROPOFOL;  Surgeon: Lin Landsman, MD;  Location: [facility] ENDOSCOPY;  Service: Gastroenterology;  Laterality: N/A;  . ROTATOR CUFF REPAIR Right 2009     Current Outpatient Medications  Medication Sig Dispense Refill  . aspirin EC 81 MG tablet Take 1 tablet (81 mg total) by mouth daily. 90 tablet 3  . Blood Glucose Monitoring Suppl (FREESTYLE LITE) DEVI     . glucose blood (FREESTYLE LITE) test strip USE 1 STRIP DAILY 100 strip 12  . ibuprofen (ADVIL,MOTRIN) 200 MG tablet Take 800 mg by mouth every 8 (eight) hours as needed for moderate pain.    . Insulin Glargine (BASAGLAR KWIKPEN) 100 UNIT/ML Inject 30 Units into the skin daily.    . metFORMIN (GLUCOPHAGE-XR) 500 MG 24 hr tablet Take 500 mg by mouth 2 (two) times daily.    . metoprolol succinate (TOPROL-XL) 50 MG 24 hr tablet Take 1 tablet (50 mg total) by mouth daily. Take with or immediately following a meal. 90 tablet 3  . Multiple Vitamin (MULTIVITAMIN WITH MINERALS) TABS tablet Take 1 tablet by mouth at bedtime.    . NONFORMULARY OR COMPOUNDED ITEM Trimix (30/1/10)-(Pap/Phent/PGE) Dosage: Inject 1.0 cc per injection Prefilled syringe : 10 syringes Qty10 [place] [phone] Fax  [phone] 10 each 6  . omeprazole (PRILOSEC) 20 MG capsule Take 1 capsule (20 mg total) by mouth daily. 90 capsule 3  . rosuvastatin (CRESTOR) 40 MG tablet Take 1 tablet (40 mg total) by mouth at bedtime. 90 tablet 3  . sildenafil (VIAGRA) 100 MG tablet Take 1 tablet (100 mg total) by mouth daily as needed for erectile dysfunction. 30 tablet 6  . traZODone (DESYREL) 50 MG tablet TAKE 1 TABLET BY MOUTH AT BEDTIME 30 tablet 1  . UNIFINE PENTIPS 29G X 12MM MISC Use to inject insulin daily 30  each 6   No current facility-administered medications for this visit.    Allergies:   Latex    Social History:  The patient  reports that he quit smoking about 37 years ago. He has never used smokeless tobacco. He reports current alcohol use of about 24.0 standard drinks of alcohol per week. He reports that he does not use drugs.   Family History:  The patient's family history includes Heart attack in his father, maternal uncle, maternal uncle, maternal uncle, and maternal uncle.    ROS:  Please see the history of present illness.   Otherwise, review of systems are positive for none.   All other systems are reviewed and negative.    PHYSICAL EXAM: VS:  BP 140/70 (BP Location: Left Arm, Patient Position: Sitting, Cuff Size: Normal)   Pulse 66   Ht 5\' 11"  (1.803 m)   Wt 211 lb 2 oz (95.8 kg)   SpO2 98%   BMI 29.45 kg/m  , BMI Body mass index is 29.45 kg/m. GEN: Well nourished, well developed, in no acute distress  HEENT: normal  Neck: no JVD, carotid bruits, or masses Cardiac: RRR; no  rubs, or gallops,no edema .  1/ 6 systolic murmur in the aortic area Respiratory:  clear to auscultation bilaterally, normal work of breathing GI: soft, nontender, nondistended, + BS MS: no deformity or atrophy  Skin: warm and dry, no rash Neuro:  Strength and sensation are intact Psych: euthymic mood, full affect Radial pulses normal.  EKG:  EKG is ordered today. The ekg ordered today demonstrates normal sinus rhythm with a PVC.  Nonspecific ST changes.   Recent Labs: 03/02/2019: ALT 30; Magnesium 2.0 07/04/2019: BUN 24; Creatinine, Ser 0.95; Hemoglobin 15.0; Platelets 233; Potassium 4.8; Sodium 136; TSH 1.311    Lipid Panel    Component Value Date/Time   CHOL 182 07/04/2019 0823   CHOL 239 (H) 01/20/2014 0729   TRIG 233 (H) 07/04/2019 0823   TRIG 171 01/20/2014 0729   HDL 69 07/04/2019 0823   HDL 48 01/20/2014 0729   CHOLHDL 2.6 07/04/2019 0823   VLDL 47 (H) 07/04/2019 0823   VLDL  34 01/20/2014 0729   LDLCALC 66 07/04/2019 0823   LDLCALC 157 (H) 01/20/2014 0729      Wt Readings from Last 3 Encounters:  12/15/19 211 lb 2 oz (95.8 kg)  12/12/19 210 lb (95.3 kg)  08/29/19 195 lb (88.5 kg)        ASSESSMENT AND PLAN:  1.  Unstable angina: The patient reports recent symptoms of exertional chest pain and dyspnea that progressed to rest pain.  This is very concerning especially with his multiple risk factors.  Fortunately, his EKG does not show any significant ST deviation.  I am going to obtain stat troponin.  If positive, the patient will be directed to the emergency room for admission and inpatient work-up. Otherwise, we will plan on doing  an urgent left heart catheterization possible PCI tomorrow morning at [facility].  I discussed the procedure in details as well as risk and benefits.  2.  Hyperlipidemia: Previous LDL was greater than 150.  However, most recent LDL in June was 66 since he has been on rosuvastatin 40 mg daily.  3.  Essential hypertension: Blood pressures is mildly elevated.  Will consider switching metoprolol to carvedilol especially that he is diabetic.  4.  Diabetes mellitus: Does not seem to be controlled.  Consider adding an SGLT2 inhibitor.    Orders Placed This Encounter  Procedures  . Basic metabolic panel  . CBC w/Diff  . Troponin T  . EKG 12-Lead     Disposition:   FU with me in 2 weeks.  Signed, [name], MD 12/15/2019 2:06 PM    [place]

## 2019-12-15 NOTE — Telephone Encounter (Signed)
Spoke with the patient. Advised the patient that Dr. Fletcher Anon has reviewed his STAT lab results (troponin, bmp, cbc).  Per Dr. Fletcher Anon his troponin is just slightly elevated and he will not need to be directed to the ED this evening. We will proceed with the planned cardiac cath tomorrow morning at [facility].  However if he has any reoccurrence of chest pain over night he should not wait and should report to the ED asap.  Patient verbalized understanding and voiced appreciation for the call.

## 2019-12-15 NOTE — Patient Instructions (Signed)
Medication Instructions:  Your physician recommends that you continue on your current medications as directed. Please refer to the Current Medication list given to you today.  *If you need a refill on your cardiac medications before your next appointment, please call your pharmacy*   Lab Work: Troponin, BMP, CBC today at the medical mall. Please stop at the registration desk and let them know you are there for labs.  If you have labs (blood work) drawn today and your tests are completely normal, you will receive your results only by: Marland Kitchen MyChart Message (if you have MyChart) OR . A paper copy in the mail If you have any lab test that is abnormal or we need to change your treatment, we will call you to review the results.   Testing/Procedures: Your physician has requested that you have a cardiac catheterization. Cardiac catheterization is used to diagnose and/or treat various heart conditions. Doctors may recommend this procedure for a number of different reasons. The most common reason is to evaluate chest pain. Chest pain can be a symptom of coronary artery disease (CAD), and cardiac catheterization can show whether plaque is narrowing or blocking your heart's arteries. This procedure is also used to evaluate the valves, as well as measure the blood flow and oxygen levels in different parts of your heart. For further information please visit [email or web address].[email or web address]. Please follow instruction sheet, as given.     Follow-Up: At [facility], you and your health needs are our priority.  As part of our continuing mission to provide you with exceptional heart care, we have created designated Provider Care Teams.  These Care Teams include your primary Cardiologist (physician) and Advanced Practice Providers (APPs -  Physician Assistants and Nurse Practitioners) who all work together to provide you with the care you need, when you need it.  We recommend signing up for the patient portal called  "MyChart".  Sign up information is provided on this After Visit Summary.  MyChart is used to connect with patients for Virtual Visits (Telemedicine).  Patients are able to view lab/test results, encounter notes, upcoming appointments, etc.  Non-urgent messages can be sent to your provider as well.   To learn more about what you can do with MyChart, go to [email or web address].    Your next appointment:   2 week(s)  The format for your next appointment:   In Person  Provider:   You may see [name], MD or one of the following Advanced Practice Providers on your designated Care Team:    Murray Hodgkins, NP  Christell Faith, PA-C  Marrianne Mood, PA-C  Cadence Kathlen Mody, [place]  Laurann Montana, NP    Other Instructions    [place], [place] [address] Dept: [phone] Loc: [place]  12/15/2019  You are scheduled for a Cardiac Catheterization on Friday, December 10 with Dr. [name].  1. Please arrive at the [facility] (Main Entrance A) at [facility]: [address] [place], [place] [address] at 7:30 AM (This time is 3 hours before your procedure to ensure your preparation). Free valet parking service is available.   Special note: Every effort is made to have your procedure done on time. Please understand that emergencies sometimes delay scheduled procedures.  2. Diet: Do not eat solid foods after midnight.  The patient may have clear liquids until 5am upon the day of the procedure.  3. Labs: You  will need to have blood drawn today at the medical mall. Please stop at the registration desk on your way out.  You will need a COVID test first thing in the morning prior to the procedure. Please report to [facility] main entrance and stop at the desk on the right.   4. Medication instructions in preparation for your  procedure:   Contrast Allergy: No       Take only 15 units of insulin the night before your procedure. Do not take any insulin on the day of the procedure.  Do not take Diabetes Med Glucophage (Metformin) on the day of the procedure and HOLD 48 HOURS AFTER THE PROCEDURE.  On the morning of your procedure, take your Aspirin and any morning medicines NOT listed above.  You may use sips of water.  5. Plan for one night stay--bring personal belongings. 6. Bring a current list of your medications and current insurance cards. 7. You MUST have a responsible person to drive you home. 8. Someone MUST be with you the first 24 hours after you arrive home or your discharge will be delayed. 9. Please wear clothes that are easy to get on and off and wear slip-on shoes.  Thank you for allowing [place] to care for you!   -- [name] [facility]

## 2019-12-16 ENCOUNTER — Other Ambulatory Visit (HOSPITAL_COMMUNITY): Payer: Self-pay | Admitting: Cardiology

## 2019-12-16 ENCOUNTER — Encounter (HOSPITAL_COMMUNITY)
Admission: RE | Disposition: A | Discharge status: Home / Self Care | Payer: Self-pay | Source: Home / Self Care | Attending: Cardiovascular Disease

## 2019-12-16 ENCOUNTER — Other Ambulatory Visit: Payer: Self-pay

## 2019-12-16 ENCOUNTER — Ambulatory Visit (HOSPITAL_COMMUNITY)
Admission: RE | Admit: 2019-12-16 | Discharge: 2019-12-16 | Disposition: A | Discharge status: Home / Self Care | Payer: No Typology Code available for payment source | Attending: Cardiovascular Disease | Admitting: Cardiovascular Disease

## 2019-12-16 DIAGNOSIS — Z955 Presence of coronary angioplasty implant and graft: Secondary | ICD-10-CM | POA: Diagnosis not present

## 2019-12-16 DIAGNOSIS — I200 Unstable angina: Secondary | ICD-10-CM

## 2019-12-16 DIAGNOSIS — E119 Type 2 diabetes mellitus without complications: Secondary | ICD-10-CM | POA: Insufficient documentation

## 2019-12-16 DIAGNOSIS — Z87891 Personal history of nicotine dependence: Secondary | ICD-10-CM | POA: Insufficient documentation

## 2019-12-16 DIAGNOSIS — Z79899 Other long term (current) drug therapy: Secondary | ICD-10-CM | POA: Diagnosis not present

## 2019-12-16 DIAGNOSIS — Z20822 Contact with and (suspected) exposure to covid-19: Secondary | ICD-10-CM | POA: Diagnosis not present

## 2019-12-16 DIAGNOSIS — I10 Essential (primary) hypertension: Secondary | ICD-10-CM | POA: Diagnosis not present

## 2019-12-16 DIAGNOSIS — I25110 Atherosclerotic heart disease of native coronary artery with unstable angina pectoris: Secondary | ICD-10-CM | POA: Insufficient documentation

## 2019-12-16 DIAGNOSIS — E785 Hyperlipidemia, unspecified: Secondary | ICD-10-CM | POA: Diagnosis not present

## 2019-12-16 DIAGNOSIS — Z794 Long term (current) use of insulin: Secondary | ICD-10-CM | POA: Insufficient documentation

## 2019-12-16 DIAGNOSIS — Z7982 Long term (current) use of aspirin: Secondary | ICD-10-CM | POA: Insufficient documentation

## 2019-12-16 HISTORY — PX: LEFT HEART CATH AND CORONARY ANGIOGRAPHY: CATH118249

## 2019-12-16 HISTORY — PX: CORONARY STENT INTERVENTION: CATH118234

## 2019-12-16 LAB — SARS CORONAVIRUS 2 BY RT PCR (HOSPITAL ORDER, PERFORMED IN ~~LOC~~ HOSPITAL LAB): SARS Coronavirus 2: NEGATIVE

## 2019-12-16 LAB — GLUCOSE, CAPILLARY: Glucose-Capillary: 200 mg/dL — ABNORMAL HIGH (ref 70–99)

## 2019-12-16 LAB — POCT ACTIVATED CLOTTING TIME
Activated Clotting Time: 291 seconds
Activated Clotting Time: 422 seconds

## 2019-12-16 SURGERY — LEFT HEART CATH AND CORONARY ANGIOGRAPHY
Anesthesia: LOCAL

## 2019-12-16 MED ORDER — MIDAZOLAM HCL 2 MG/2ML IJ SOLN
INTRAMUSCULAR | Status: DC | PRN
  Administered 2019-12-16: 1 mg via INTRAVENOUS

## 2019-12-16 MED ORDER — ASPIRIN EC 81 MG PO TBEC: 81.0000 mg | DELAYED_RELEASE_TABLET | Freq: Every day | ORAL | Status: DC

## 2019-12-16 MED ORDER — VERAPAMIL HCL 2.5 MG/ML IV SOLN
INTRAVENOUS | Status: AC
  Filled 2019-12-16: qty 2

## 2019-12-16 MED ORDER — TICAGRELOR 90 MG PO TABS
ORAL_TABLET | ORAL | Status: AC
  Filled 2019-12-16: qty 2

## 2019-12-16 MED ORDER — VERAPAMIL HCL 2.5 MG/ML IV SOLN
INTRAVENOUS | Status: DC | PRN
  Administered 2019-12-16: 10 mL via INTRA_ARTERIAL

## 2019-12-16 MED ORDER — FENTANYL CITRATE (PF) 100 MCG/2ML IJ SOLN
INTRAMUSCULAR | Status: AC
  Filled 2019-12-16: qty 2

## 2019-12-16 MED ORDER — SODIUM CHLORIDE 0.9 % IV SOLN: INTRAVENOUS | Status: DC

## 2019-12-16 MED ORDER — SODIUM CHLORIDE 0.9 % IV SOLN
250.0000 mL | INTRAVENOUS | Status: DC | PRN
Start: 2019-12-16 — End: 2019-12-16

## 2019-12-16 MED ORDER — HEPARIN (PORCINE) IN NACL 1000-0.9 UT/500ML-% IV SOLN
INTRAVENOUS | Status: DC | PRN
  Administered 2019-12-16: 500 mL

## 2019-12-16 MED ORDER — ACETAMINOPHEN 325 MG PO TABS: 650.0000 mg | ORAL_TABLET | ORAL | Status: DC | PRN

## 2019-12-16 MED ORDER — SODIUM CHLORIDE 0.9% FLUSH: 3.0000 mL | Freq: Two times a day (BID) | INTRAVENOUS | Status: DC

## 2019-12-16 MED ORDER — METFORMIN HCL ER 500 MG PO TB24: 500.0000 mg | ORAL_TABLET | Freq: Two times a day (BID) | ORAL | Status: DC

## 2019-12-16 MED ORDER — NITROGLYCERIN 1 MG/10 ML FOR IR/CATH LAB
INTRA_ARTERIAL | Status: DC | PRN
  Administered 2019-12-16: 200 ug via INTRACORONARY

## 2019-12-16 MED ORDER — HEPARIN SODIUM (PORCINE) 1000 UNIT/ML IJ SOLN
INTRAMUSCULAR | Status: AC
  Filled 2019-12-16: qty 1

## 2019-12-16 MED ORDER — NITROGLYCERIN 1 MG/10 ML FOR IR/CATH LAB
INTRA_ARTERIAL | Status: AC
  Filled 2019-12-16: qty 10

## 2019-12-16 MED ORDER — ASPIRIN 81 MG PO CHEW: 81.0000 mg | CHEWABLE_TABLET | ORAL | Status: DC

## 2019-12-16 MED ORDER — TICAGRELOR 90 MG PO TABS
ORAL_TABLET | ORAL | Status: DC | PRN
  Administered 2019-12-16: 180 mg via ORAL

## 2019-12-16 MED ORDER — HEPARIN (PORCINE) IN NACL 1000-0.9 UT/500ML-% IV SOLN
INTRAVENOUS | Status: AC
  Filled 2019-12-16: qty 1000

## 2019-12-16 MED ORDER — ACETAMINOPHEN 325 MG PO TABS
ORAL_TABLET | ORAL | Status: AC
  Administered 2019-12-16: 650 mg via ORAL
  Filled 2019-12-16: qty 2

## 2019-12-16 MED ORDER — PANTOPRAZOLE SODIUM 40 MG PO TBEC
40.0000 mg | DELAYED_RELEASE_TABLET | Freq: Every day | ORAL | Status: DC
  Administered 2019-12-16: 40 mg via ORAL
  Filled 2019-12-16: qty 1

## 2019-12-16 MED ORDER — LIDOCAINE HCL (PF) 1 % IJ SOLN
INTRAMUSCULAR | Status: DC | PRN
  Administered 2019-12-16: 2 mL

## 2019-12-16 MED ORDER — SODIUM CHLORIDE 0.9 % IV SOLN: 250.0000 mL | INTRAVENOUS | Status: DC | PRN

## 2019-12-16 MED ORDER — LIDOCAINE HCL (PF) 1 % IJ SOLN
INTRAMUSCULAR | Status: AC
  Filled 2019-12-16: qty 30

## 2019-12-16 MED ORDER — METOPROLOL SUCCINATE ER 25 MG PO TB24
50.0000 mg | ORAL_TABLET | Freq: Every day | ORAL | Status: DC
  Administered 2019-12-16: 50 mg via ORAL
  Filled 2019-12-16: qty 2

## 2019-12-16 MED ORDER — MIDAZOLAM HCL 2 MG/2ML IJ SOLN
INTRAMUSCULAR | Status: AC
  Filled 2019-12-16: qty 2

## 2019-12-16 MED ORDER — HEPARIN SODIUM (PORCINE) 1000 UNIT/ML IJ SOLN
INTRAMUSCULAR | Status: DC | PRN
  Administered 2019-12-16: 2000 [IU] via INTRAVENOUS
  Administered 2019-12-16: 4500 [IU] via INTRAVENOUS
  Administered 2019-12-16: 5000 [IU] via INTRAVENOUS

## 2019-12-16 MED ORDER — ANGIOPLASTY BOOK
Status: AC
  Filled 2019-12-16: qty 1

## 2019-12-16 MED ORDER — ONDANSETRON HCL 4 MG/2ML IJ SOLN: 4.0000 mg | Freq: Four times a day (QID) | INTRAMUSCULAR | Status: DC | PRN

## 2019-12-16 MED ORDER — ADULT MULTIVITAMIN W/MINERALS CH: 1.0000 | ORAL_TABLET | Freq: Every day | ORAL | Status: DC

## 2019-12-16 MED ORDER — NITROGLYCERIN 0.4 MG SL SUBL: 0.4000 mg | SUBLINGUAL_TABLET | SUBLINGUAL | 2 refills | Status: DC | PRN

## 2019-12-16 MED ORDER — BASAGLAR KWIKPEN 100 UNIT/ML ~~LOC~~ SOPN: 30.0000 [IU] | PEN_INJECTOR | Freq: Every day | SUBCUTANEOUS | Status: DC

## 2019-12-16 MED ORDER — TICAGRELOR 90 MG PO TABS: 90.0000 mg | ORAL_TABLET | Freq: Two times a day (BID) | ORAL | Status: DC

## 2019-12-16 MED ORDER — SODIUM CHLORIDE 0.9 % WEIGHT BASED INFUSION
3.0000 mL/kg/h | INTRAVENOUS | Status: AC
  Administered 2019-12-16: 3 mL/kg/h via INTRAVENOUS

## 2019-12-16 MED ORDER — SODIUM CHLORIDE 0.9% FLUSH: 3.0000 mL | INTRAVENOUS | Status: DC | PRN

## 2019-12-16 MED ORDER — ROSUVASTATIN CALCIUM 40 MG PO TABS: 40.0000 mg | ORAL_TABLET | Freq: Every day | ORAL | Status: DC

## 2019-12-16 MED ORDER — TRAZODONE HCL 50 MG PO TABS: 50.0000 mg | ORAL_TABLET | Freq: Every day | ORAL | Status: DC

## 2019-12-16 MED ORDER — TICAGRELOR 90 MG PO TABS
90.0000 mg | ORAL_TABLET | Freq: Two times a day (BID) | ORAL | 3 refills | Status: DC
Start: 2019-12-16 — End: 2019-12-23

## 2019-12-16 MED ORDER — IOHEXOL 350 MG/ML SOLN
INTRAVENOUS | Status: DC | PRN
  Administered 2019-12-16: 100 mL

## 2019-12-16 MED ORDER — FENTANYL CITRATE (PF) 100 MCG/2ML IJ SOLN
INTRAMUSCULAR | Status: DC | PRN
  Administered 2019-12-16: 25 ug via INTRAVENOUS

## 2019-12-16 MED ORDER — SODIUM CHLORIDE 0.9 % WEIGHT BASED INFUSION: 1.0000 mL/kg/h | INTRAVENOUS | Status: DC

## 2019-12-16 MED FILL — NITROGLYCERIN 0.4 MG TAB SL: 0.4 | 8 days supply | Qty: 25 | Fill #0

## 2019-12-16 MED FILL — BRILINTA 90 MG TABLET: 90 | 30 days supply | Qty: 60 | Fill #0

## 2019-12-16 SURGICAL SUPPLY — 16 items
BALLN SAPPHIRE 2.5X12 (BALLOONS) ×2
BALLN SAPPHIRE ~~LOC~~ 3.25X15 (BALLOONS) ×2 IMPLANT
BALLOON SAPPHIRE 2.5X12 (BALLOONS) ×1 IMPLANT
CATH INFINITI 5FR JK (CATHETERS) ×2 IMPLANT
CATH LAUNCHER 6FR JR4 (CATHETERS) ×2 IMPLANT
DEVICE RAD COMP TR BAND LRG (VASCULAR PRODUCTS) ×2 IMPLANT
GLIDESHEATH SLEND SS 6F .021 (SHEATH) ×2 IMPLANT
GUIDEWIRE INQWIRE 1.5J.035X260 (WIRE) ×1 IMPLANT
INQWIRE 1.5J .035X260CM (WIRE) ×2
KIT ENCORE 26 ADVANTAGE (KITS) ×2 IMPLANT
KIT HEART LEFT (KITS) ×2 IMPLANT
PACK CARDIAC CATHETERIZATION (CUSTOM PROCEDURE TRAY) ×2 IMPLANT
STENT RESOLUTE ONYX 3.0X22 (Permanent Stent) ×2 IMPLANT
TRANSDUCER W/STOPCOCK (MISCELLANEOUS) ×2 IMPLANT
TUBING CIL FLEX 10 FLL-RA (TUBING) ×2 IMPLANT
WIRE RUNTHROUGH .014X180CM (WIRE) ×2 IMPLANT

## 2019-12-16 NOTE — Progress Notes (Signed)
Curtis Singh in earlier and ok to d/c home if no problems; Dr Fletcher Anon in earlier also

## 2019-12-16 NOTE — Research (Addendum)
[place]- Identify  Informed Consent   Subject Name: Curtis Singh  Subject met inclusion and exclusion criteria.  The informed consent form, study requirements and expectations were reviewed with the subject and questions and concerns were addressed prior to the signing of the consent form.  The subject verbalized understanding of the trial requirements.  The subject agreed to participate in the [email or web address]- Identify trial and signed the informed consent at 0830 on 12/16/2019.  The informed consent was obtained prior to performance of any protocol-specific procedures for the subject.  A copy of the signed informed consent was given to the subject and a copy was placed in the subject's medical record.   Karilynn Carranza

## 2019-12-16 NOTE — Discharge Summary (Signed)
Discharge Summary for Same Day PCI   Patient ID: Curtis Singh MRN: [id number]; DOB: 1955-12-13  Admit date: 12/16/2019 Discharge date: 12/16/2019  Primary Care Provider: Cletis Athens, MD  Primary Cardiologist: Curtis Sacramento, MD  Primary Electrophysiologist:  None   Discharge Diagnoses    Active Problems:   Unstable angina [facility])    Diagnostic Studies/Procedures    Cardiac Catheterization 12/16/2019:   Prox RCA to Mid RCA lesion is 99% stenosed.  Post intervention, there is a 0% residual stenosis.  A drug-eluting stent was successfully placed using a STENT RESOLUTE ONYX 3.0X22.  Mid RCA lesion is 20% stenosed.  The left ventricular systolic function is normal.  LV end diastolic pressure is normal.  The left ventricular ejection fraction is 55-65% by visual estimate.   1.  Severe one-vessel coronary artery disease with 99% thrombotic stenosis in proximal/mid RCA just before a tortuous segment.  No significant disease affecting the left coronary arteries. 2.  Normal LV systolic function and normal left ventricular end-diastolic pressure 3.  Successful angioplasty and drug-eluting stent placement to the right coronary artery.  Recommendations: Dual antiplatelet therapy with aspirin and ticagrelor for at least 12 months. Aggressive treatment of risk factors and cardiac rehab. The patient is a candidate for same-day discharge.  Diagnostic Dominance: Right    Intervention     _____________   History of Present Illness     Curtis Singh is a 64 y.o. male with urgent evaluation of chest pain.  He has known history of diabetes mellitus on insulin, hyperlipidemia, essential hypertension, aortic atherosclerosis and strong family history of coronary artery disease. He is not a smoker. He had a Lexiscan Myoview in February of this year which showed no evidence of ischemia with normal ejection fraction. Over the last 2 to 3 weeks, he experienced substernal chest pain  and tightness with happens with any kind of exertion.  The pain lasts for about 15 minutes and subsides with resting.  It is associated with shortness of breath and fatigue.  Today, he reports chest pain at rest but it is very mild overall.  No palpitations, dizziness or syncope.  No previous similar symptoms.  No previous history of cardiac catheterization.  He reports that his diabetes has been uncontrolled.  He thinks it might be related to receiving the COVID-19 vaccine.  Nonetheless, he did gain about 15 pounds in the last year.  Cardiac catheterization was arranged for further evaluation.  Hospital Course     The patient underwent cardiac cath as noted above with severe single coronary disease with 99% thrombotic stenosis in the p/mRCA. Successful angioplasty and DESx1 to the RCA. Plan for DAPT with ASA/Brilinta for at least one year. The patient was seen by cardiac rehab while in short stay. There were no observed complications post cath. Radial cath site was re-evaluated prior to discharge and found to be stable without any complications. Instructions/precautions regarding cath site care were given prior to discharge.  Curtis Singh was seen by Dr. Fletcher Singh and determined stable for discharge home. Follow up with our office has been arranged. Medications are listed below. Pertinent changes include addition of Brilinta/nitro.  _____________  Cath/PCI Registry Performance & Quality Measures: 1. Aspirin prescribed? - Yes 2. ADP Receptor Inhibitor (Plavix/Clopidogrel, Brilinta/Ticagrelor or Effient/Prasugrel) prescribed (includes medically managed patients)? - Yes 3. High Intensity Statin (Lipitor 40-80mg  or Crestor 20-40mg ) prescribed? - Yes 4. For EF <40%, was ACEI/ARB prescribed? - Not Applicable (EF >/= 12%) 5. For EF <40%, Aldosterone  Antagonist (Spironolactone or Eplerenone) prescribed? - Not Applicable (EF >/= 37%) 6. Cardiac Rehab Phase II ordered (Included Medically managed Patients)? -  Yes  _____________   Discharge Vitals Blood pressure (!) 138/99, pulse 65, temperature 97.6 F (36.4 C), temperature source Oral, resp. rate 18, height 5\' 11"  (1.803 m), weight 90.7 kg, SpO2 97 %.  Filed Weights   12/16/19 0740  Weight: 90.7 kg    Last Labs & Radiologic Studies    CBC Recent Labs    12/15/19 1446  WBC 6.7  NEUTROABS 3.5  HGB 14.8  HCT 43.7  MCV 91.6  PLT 169   Basic Metabolic Panel Recent Labs    12/15/19 1446  NA 138  K 4.0  CL 105  CO2 25  GLUCOSE 223*  BUN 15  CREATININE 0.79  CALCIUM 9.7   Liver Function Tests No results for input(s): AST, ALT, ALKPHOS, BILITOT, PROT, ALBUMIN in the last 72 hours. No results for input(s): LIPASE, AMYLASE in the last 72 hours. High Sensitivity Troponin:   Recent Labs  Lab 12/15/19 1446  TROPONINIHS 30*    BNP Invalid input(s): POCBNP D-Dimer No results for input(s): DDIMER in the last 72 hours. Hemoglobin A1C No results for input(s): HGBA1C in the last 72 hours. Fasting Lipid Panel No results for input(s): CHOL, HDL, LDLCALC, TRIG, CHOLHDL, LDLDIRECT in the last 72 hours. Thyroid Function Tests No results for input(s): TSH, T4TOTAL, T3FREE, THYROIDAB in the last 72 hours.  Invalid input(s): FREET3 _____________  CARDIAC CATHETERIZATION  Result Date: 12/16/2019  Prox RCA to Mid RCA lesion is 99% stenosed.  Post intervention, there is a 0% residual stenosis.  A drug-eluting stent was successfully placed using a STENT RESOLUTE ONYX 3.0X22.  Mid RCA lesion is 20% stenosed.  The left ventricular systolic function is normal.  LV end diastolic pressure is normal.  The left ventricular ejection fraction is 55-65% by visual estimate.  1.  Severe one-vessel coronary artery disease with 99% thrombotic stenosis in proximal/mid RCA just before a tortuous segment.  No significant disease affecting the left coronary arteries. 2.  Normal LV systolic function and normal left ventricular end-diastolic pressure  3.  Successful angioplasty and drug-eluting stent placement to the right coronary artery. Recommendations: Dual antiplatelet therapy with aspirin and ticagrelor for at least 12 months. Aggressive treatment of risk factors and cardiac rehab. The patient is a candidate for same-day discharge.    Disposition   Pt is being discharged home today in good condition.  Follow-up Plans & Appointments     Follow-up Information    Loel Dubonnet, NP Follow up on 12/23/2019.   Specialty: Cardiology Why: at 2pm for your follow up appt Contact information: [place] [id number] [phone]              Discharge Instructions    AMB Referral to Cardiac Rehabilitation - Phase II   Complete by: As directed    Diagnosis:  Coronary Stents NSTEMI     After initial evaluation and assessments completed: Virtual Based Care may be provided alone or in conjunction with Phase 2 Cardiac Rehab based on patient barriers.: Yes       Discharge Medications   Allergies as of 12/16/2019      Reactions   Latex Rash      Medication List    STOP taking these medications   ibuprofen 200 MG tablet Commonly known as: ADVIL     TAKE these medications  aspirin EC 81 MG tablet Take 1 tablet (81 mg total) by mouth daily.   Basaglar KwikPen 100 UNIT/ML Inject 30 Units into the skin daily.   FreeStyle Lite Devi   FREESTYLE LITE test strip Generic drug: glucose blood USE 1 STRIP DAILY   metFORMIN 500 MG 24 hr tablet Commonly known as: GLUCOPHAGE-XR Take 1 tablet (500 mg total) by mouth 2 (two) times daily. Start taking on: December 18, 2019 What changed: These instructions start on December 18, 2019. If you are unsure what to do until then, ask your doctor or other care provider.   metoprolol succinate 50 MG 24 hr tablet Commonly known as: TOPROL-XL Take 1 tablet (50 mg total) by mouth daily. Take with or immediately following a meal.   multivitamin with  minerals Tabs tablet Take 1 tablet by mouth at bedtime.   nitroGLYCERIN 0.4 MG SL tablet Commonly known as: Nitrostat Place 1 tablet (0.4 mg total) under the tongue every 5 (five) minutes as needed.   NONFORMULARY OR COMPOUNDED ITEM Trimix (30/1/10)-(Pap/Phent/PGE) Dosage: Inject 1.0 cc per injection Prefilled syringe : 10 syringes [address] [place] [phone] Fax [phone]   omeprazole 20 MG capsule Commonly known as: PRILOSEC Take 1 capsule (20 mg total) by mouth daily.   rosuvastatin 40 MG tablet Commonly known as: CRESTOR Take 1 tablet (40 mg total) by mouth at bedtime.   sildenafil 100 MG tablet Commonly known as: VIAGRA Take 1 tablet (100 mg total) by mouth daily as needed for erectile dysfunction.   ticagrelor 90 MG Tabs tablet Commonly known as: BRILINTA Take 1 tablet (90 mg total) by mouth 2 (two) times daily.   traZODone 50 MG tablet Commonly known as: DESYREL TAKE 1 TABLET BY MOUTH AT BEDTIME   Unifine Pentips 29G X 12MM Misc Generic drug: Insulin Pen Needle Use to inject insulin daily          Allergies Allergies  Allergen Reactions  . Latex Rash    Outstanding Labs/Studies   N/a   Duration of Discharge Encounter   Greater than 30 minutes including physician time.  Signed, Reino Bellis, NP 12/16/2019, 11:19 AM

## 2019-12-16 NOTE — Discharge Instructions (Signed)
NO METFORMIN FOR 2 DAYS   Radial Site Care  This sheet gives you information about how to care for yourself after your procedure. Your health care provider may also give you more specific instructions. If you have problems or questions, contact your health care provider. What can I expect after the procedure? After the procedure, it is common to have:  Bruising and tenderness at the catheter insertion area. Follow these instructions at home: Medicines  Take over-the-counter and prescription medicines only as told by your health care provider. Insertion site care  Follow instructions from your health care provider about how to take care of your insertion site. Make sure you: ? Wash your hands with soap and water before you change your bandage (dressing). If soap and water are not available, use hand sanitizer. ? Change your dressing as told by your health care provider. ? Leave stitches (sutures), skin glue, or adhesive strips in place. These skin closures may need to stay in place for 2 weeks or longer. If adhesive strip edges start to loosen and curl up, you may trim the loose edges. Do not remove adhesive strips completely unless your health care provider tells you to do that.  Check your insertion site every day for signs of infection. Check for: ? Redness, swelling, or pain. ? Fluid or blood. ? Pus or a bad smell. ? Warmth.  Do not take baths, swim, or use a hot tub until your health care provider approves.  You may shower 24-48 hours after the procedure, or as directed by your health care provider. ? Remove the dressing and gently wash the site with plain soap and water. ? Pat the area dry with a clean towel. ? Do not rub the site. That could cause bleeding.  Do not apply powder or lotion to the site. Activity   For 24 hours after the procedure, or as directed by your health care provider: ? Do not flex or bend the affected arm. ? Do not push or pull heavy objects with  the affected arm. ? Do not drive yourself home from the hospital or clinic. You may drive 24 hours after the procedure unless your health care provider tells you not to. ? Do not operate machinery or power tools.  Do not lift anything that is heavier than 10 lb (4.5 kg), or the limit that you are told, until your health care provider says that it is safe.  Ask your health care provider when it is okay to: ? Return to work or school. ? Resume usual physical activities or sports. ? Resume sexual activity. General instructions  If the catheter site starts to bleed, raise your arm and put firm pressure on the site. If the bleeding does not stop, get help right away. This is a medical emergency.  If you went home on the same day as your procedure, a responsible adult should be with you for the first 24 hours after you arrive home.  Keep all follow-up visits as told by your health care provider. This is important. Contact a health care provider if:  You have a fever.  You have redness, swelling, or yellow drainage around your insertion site. Get help right away if:  You have unusual pain at the radial site.  The catheter insertion area swells very fast.  The insertion area is bleeding, and the bleeding does not stop when you hold steady pressure on the area.  Your arm or hand becomes pale, cool, tingly, or numb.  These symptoms may represent a serious problem that is an emergency. Do not wait to see if the symptoms will go away. Get medical help right away. Call your local emergency services (911 in the [place] Do not drive yourself to the hospital. Summary  After the procedure, it is common to have bruising and tenderness at the site.  Follow instructions from your health care provider about how to take care of your radial site wound. Check the wound every day for signs of infection.  Do not lift anything that is heavier than 10 lb (4.5 kg), or the limit that you are told, until your  health care provider says that it is safe. This information is not intended to replace advice given to you by your health care provider. Make sure you discuss any questions you have with your health care provider. Document Revised: 01/28/2017 Document Reviewed: 01/28/2017 Elsevier Patient Education  2020 [employer].

## 2019-12-16 NOTE — Interval H&P Note (Signed)
Cath Lab Visit (complete for each Cath Lab visit)  Clinical Evaluation Leading to the Procedure:   ACS: Yes.    Non-ACS:  n/a      History and Physical Interval Note:  12/16/2019 9:14 AM  Curtis Singh  has presented today for surgery, with the diagnosis of unstable angina.  The various methods of treatment have been discussed with the patient and family. After consideration of risks, benefits and other options for treatment, the patient has consented to  Procedure(s): LEFT HEART CATH AND CORONARY ANGIOGRAPHY (N/A) as a surgical intervention.  The patient's history has been reviewed, patient examined, no change in status, stable for surgery.  I have reviewed the patient's chart and labs.  Questions were answered to the patient's satisfaction.     [name]

## 2019-12-16 NOTE — Progress Notes (Signed)
Ed completed including stent, restrictions, Brilinta, diet, exercise, NTG and CRPII. Will refer to [facility] but pt probably will exercise on his own. Understands importance of Brilinta. Planning to work on A1C, diet, exercise.  [id number] 2:20 PM 12/16/2019 Yves Dill CES, ACSM

## 2019-12-17 ENCOUNTER — Telehealth: Payer: Self-pay | Admitting: Physician Assistant

## 2019-12-17 NOTE — Telephone Encounter (Signed)
Patient paged after hour answering service regarding chest discomfort. He has not taken any Viagra in the past 48 hours, instructed the patient to take sublingual nitro. If still have chest pain after 3rd nitroglycerine, he will need to go to ED.

## 2019-12-19 ENCOUNTER — Telehealth: Payer: Self-pay | Admitting: Cardiovascular Disease

## 2019-12-19 ENCOUNTER — Encounter (HOSPITAL_COMMUNITY): Payer: Self-pay | Admitting: Cardiovascular Disease

## 2019-12-19 NOTE — Telephone Encounter (Signed)
Call to patient who reported intermittent midsternal pressure.  Pt denies diaphoresis, shortness of breath, nausea or vomiting.  Took 1 NTG on Saturday with relief, and 1 Sunday with relief.  Pt states pressure sometimes occurs after exertion and today he has had the pressure intermittently but it was less than Saturday.  Reviewed patient's symptoms with Dr Rockey Situ who recommended waiting another day or so to see if symptoms resolve.  Pt instructed to call the office or EMS immediately if symptoms worsen or change.  Pt verbalized understanding.  Georgana Curio MHA RN CCM

## 2019-12-19 NOTE — Progress Notes (Signed)
Spoke with on call physician for chest pressure and was told to use nitro, has used x 2 with relief, pt was told to call and speak with Dr Fletcher Anon

## 2019-12-19 NOTE — Telephone Encounter (Signed)
Pt c/o of Chest Pain: STAT if CP now or developed within 24 hours  1. Are you having CP right now? Yes, 3-4 out of 10  2. Are you experiencing any other symptoms (ex. SOB, nausea, vomiting, sweating)? no  3. How long have you been experiencing CP? Since saturday  4. Is your CP continuous or coming and going? Comes and goes  5. Have you taken Nitroglycerin? yes  Had stent placed 12/10 ?

## 2019-12-23 ENCOUNTER — Ambulatory Visit: Payer: No Typology Code available for payment source | Admitting: Family

## 2019-12-23 ENCOUNTER — Other Ambulatory Visit: Payer: Self-pay | Admitting: Family

## 2019-12-23 ENCOUNTER — Encounter: Payer: Self-pay | Admitting: Family

## 2019-12-23 ENCOUNTER — Other Ambulatory Visit: Payer: Self-pay

## 2019-12-23 VITALS — BP 140/70 | HR 74 | Ht 71.0 in | Wt 204.2 lb

## 2019-12-23 DIAGNOSIS — E1165 Type 2 diabetes mellitus with hyperglycemia: Secondary | ICD-10-CM

## 2019-12-23 DIAGNOSIS — I25118 Atherosclerotic heart disease of native coronary artery with other forms of angina pectoris: Secondary | ICD-10-CM | POA: Diagnosis not present

## 2019-12-23 DIAGNOSIS — Z794 Long term (current) use of insulin: Secondary | ICD-10-CM

## 2019-12-23 DIAGNOSIS — I10 Essential (primary) hypertension: Secondary | ICD-10-CM

## 2019-12-23 DIAGNOSIS — E785 Hyperlipidemia, unspecified: Secondary | ICD-10-CM | POA: Diagnosis not present

## 2019-12-23 MED ORDER — LOSARTAN POTASSIUM 25 MG PO TABS: 12.5000 mg | ORAL_TABLET | Freq: Every day | ORAL | 3 refills | Status: DC

## 2019-12-23 MED ORDER — TICAGRELOR 90 MG PO TABS: 90.0000 mg | ORAL_TABLET | Freq: Two times a day (BID) | ORAL | 3 refills | Status: DC

## 2019-12-23 NOTE — Patient Instructions (Addendum)
Medication Instructions:  Your physician has recommended you make the following change in your medication:   START Losartan 12.5mg  (half tablet)   *If you need a refill on your cardiac medications before your next appointment, please call your pharmacy*  Lab Work: Your physician recommends that you have lab work today: HbA1c  Testing/Procedures: Your EKG today showed normal sinus rhythm which is a good result!  Follow-Up: At [facility], you and your health needs are our priority.  As part of our continuing mission to provide you with exceptional heart care, we have created designated Provider Care Teams.  These Care Teams include your primary Cardiologist (physician) and Advanced Practice Providers (APPs -  Physician Assistants and Nurse Practitioners) who all work together to provide you with the care you need, when you need it.  We recommend signing up for the patient portal called "MyChart".  Sign up information is provided on this After Visit Summary.  MyChart is used to connect with patients for Virtual Visits (Telemedicine).  Patients are able to view lab/test results, encounter notes, upcoming appointments, etc.  Non-urgent messages can be sent to your provider as well.   To learn more about what you can do with MyChart, go to [email or web address].    Your next appointment:   6 week(s)  The format for your next appointment:   In Person  Provider:   You may see [name], MD or one of the following Advanced Practice Providers on your designated Care Team:    Murray Hodgkins, NP  Christell Faith, PA-C  Marrianne Mood, PA-C  Cadence Hingham, [place]  Laurann Montana, NP  Other Instructions  For any individual with coronary disease we stick to the A, B, C's...  A = Aspirin  B = Beta blocker (Metoprolol)  C = Cholesterol medicine (Rosuvastatin)  D = Don't forget nitroglycerin  E = Extra (This is your Brilinta for at least one year to help protect your  stent) ----  We may consider using Semaglutide for your diabetes management because it also has benefits in that it prevents cardiovascular disease:  OZEMPIC (Semaglutide) injection solution What is this medicine? SEMAGLUTIDE (Sem a GLOO tide) is used to improve blood sugar control in adults with type 2 diabetes. This medicine may be used with other diabetes medicines. This drug may also reduce the risk of heart attack or stroke if you have type 2 diabetes and risk factors for heart disease. This medicine may be used for other purposes; ask your health care provider or pharmacist if you have questions. COMMON BRAND NAME(S): OZEMPIC What should I tell my health care provider before I take this medicine? They need to know if you have any of these conditions:  endocrine tumors (MEN 2) or if someone in your family had these tumors  eye disease, vision problems  history of pancreatitis  kidney disease  stomach problems  thyroid cancer or if someone in your family had thyroid cancer  an unusual or allergic reaction to semaglutide, other medicines, foods, dyes, or preservatives  pregnant or trying to get pregnant  breast-feeding How should I use this medicine? This medicine is for injection under the skin of your upper leg (thigh), stomach area, or upper arm. It is given once every week (every 7 days). You will be taught how to prepare and give this medicine. Use exactly as directed. Take your medicine at regular intervals. Do not take it more often than directed. If you use this medicine with insulin, you should  inject this medicine and the insulin separately. Do not mix them together. Do not give the injections right next to each other. Change (rotate) injection sites with each injection. It is important that you put your used needles and syringes in a special sharps container. Do not put them in a trash can. If you do not have a sharps container, call your pharmacist or healthcare  provider to get one. A special MedGuide will be given to you by the pharmacist with each prescription and refill. Be sure to read this information carefully each time. This drug comes with INSTRUCTIONS FOR USE. Ask your pharmacist for directions on how to use this drug. Read the information carefully. Talk to your pharmacist or health care provider if you have questions. Talk to your pediatrician regarding the use of this medicine in children. Special care may be needed. Overdosage: If you think you have taken too much of this medicine contact a poison control center or emergency room at once. NOTE: This medicine is only for you. Do not share this medicine with others. What if I miss a dose? If you miss a dose, take it as soon as you can within 5 days after the missed dose. Then take your next dose at your regular weekly time. If it has been longer than 5 days after the missed dose, do not take the missed dose. Take the next dose at your regular time. Do not take double or extra doses. If you have questions about a missed dose, contact your health care provider for advice. What may interact with this medicine?  other medicines for diabetes Many medications may cause changes in blood sugar, these include:  alcohol containing beverages  antiviral medicines for HIV or AIDS  aspirin and aspirin-like drugs  certain medicines for blood pressure, heart disease, irregular heart beat  chromium  diuretics  male hormones, such as estrogens or progestins, birth control pills  fenofibrate  gemfibrozil  isoniazid  lanreotide  male hormones or anabolic steroids  MAOIs like Carbex, Eldepryl, Marplan, Nardil, and Parnate  medicines for weight loss  medicines for allergies, asthma, cold, or cough  medicines for depression, anxiety, or psychotic disturbances  niacin  nicotine  NSAIDs, medicines for pain and inflammation, like ibuprofen or  naproxen  octreotide  pasireotide  pentamidine  phenytoin  probenecid  quinolone antibiotics such as ciprofloxacin, levofloxacin, ofloxacin  some herbal dietary supplements  steroid medicines such as prednisone or cortisone  sulfamethoxazole; trimethoprim  thyroid hormones Some medications can hide the warning symptoms of low blood sugar (hypoglycemia). You may need to monitor your blood sugar more closely if you are taking one of these medications. These include:  beta-blockers, often used for high blood pressure or heart problems (examples include atenolol, metoprolol, propranolol)  clonidine  guanethidine  reserpine This list may not describe all possible interactions. Give your health care provider a list of all the medicines, herbs, non-prescription drugs, or dietary supplements you use. Also tell them if you smoke, drink alcohol, or use illegal drugs. Some items may interact with your medicine. What should I watch for while using this medicine? Visit your doctor or health care professional for regular checks on your progress. Drink plenty of fluids while taking this medicine. Check with your doctor or health care professional if you get an attack of severe diarrhea, nausea, and vomiting. The loss of too much body fluid can make it dangerous for you to take this medicine. A test called the HbA1C (A1C) will be  monitored. This is a simple blood test. It measures your blood sugar control over the last 2 to 3 months. You will receive this test every 3 to 6 months. Learn how to check your blood sugar. Learn the symptoms of low and high blood sugar and how to manage them. Always carry a quick-source of sugar with you in case you have symptoms of low blood sugar. Examples include hard sugar candy or glucose tablets. Make sure others know that you can choke if you eat or drink when you develop serious symptoms of low blood sugar, such as seizures or unconsciousness. They must get  medical help at once. Tell your doctor or health care professional if you have high blood sugar. You might need to change the dose of your medicine. If you are sick or exercising more than usual, you might need to change the dose of your medicine. Do not skip meals. Ask your doctor or health care professional if you should avoid alcohol. Many nonprescription cough and cold products contain sugar or alcohol. These can affect blood sugar. Pens should never be shared. Even if the needle is changed, sharing may result in passing of viruses like hepatitis or HIV. Wear a medical ID bracelet or chain, and carry a card that describes your disease and details of your medicine and dosage times. Do not become pregnant while taking this medicine. Women should inform their doctor if they wish to become pregnant or think they might be pregnant. There is a potential for serious side effects to an unborn child. Talk to your health care professional or pharmacist for more information. What side effects may I notice from receiving this medicine? Side effects that you should report to your doctor or health care professional as soon as possible:  allergic reactions like skin rash, itching or hives, swelling of the face, lips, or tongue  breathing problems  changes in vision  diarrhea that continues or is severe  lump or swelling on the neck  severe nausea  signs and symptoms of infection like fever or chills; cough; sore throat; pain or trouble passing urine  signs and symptoms of low blood sugar such as feeling anxious, confusion, dizziness, increased hunger, unusually weak or tired, sweating, shakiness, cold, irritable, headache, blurred vision, fast heartbeat, loss of consciousness  signs and symptoms of kidney injury like trouble passing urine or change in the amount of urine  trouble swallowing  unusual stomach upset or pain  vomiting Side effects that usually do not require medical attention  (report to your doctor or health care professional if they continue or are bothersome):  constipation  diarrhea  nausea  pain, redness, or irritation at site where injected  stomach upset This list may not describe all possible side effects. Call your doctor for medical advice about side effects. You may report side effects to FDA at 1-800-FDA-1088. Where should I keep my medicine? Keep out of the reach of children. Store unopened pens in a refrigerator between 2 and 8 degrees C (36 and 46 degrees F). Do not freeze. Protect from light and heat. After you first use the pen, it can be stored for 56 days at room temperature between 15 and 30 degrees C (59 and 86 degrees F) or in a refrigerator. Throw away your used pen after 56 days or after the expiration date, whichever comes first. Do not store your pen with the needle attached. If the needle is left on, medicine may leak from the pen. NOTE: This  sheet is a summary. It may not cover all possible information. If you have questions about this medicine, talk to your doctor, pharmacist, or health care provider.  2020 Elsevier/Gold Standard (2018-09-07 09:41:51)

## 2019-12-23 NOTE — Progress Notes (Signed)
Office Visit    Patient Name: Curtis Singh Date of Encounter: 12/23/2019  Primary Care Provider:  Cletis Athens, MD Primary Cardiologist:  Curtis Sacramento, MD Electrophysiologist:  None   Chief Complaint    Curtis Singh is a 64 y.o. male with a hx of CAD s/p DES to RCA 12/16/19, DM2, hyperlipidemia, hypertension, aortic atherosclerosis presents today for follow up after cardiac cath.   Past Medical History    Past Medical History:  Diagnosis Date  . Coronary artery disease    (a) DES to RCA 12/2019  . Diabetes ([place])    type 2 x 14 years  . ED (erectile dysfunction)   . GERD (gastroesophageal reflux disease)   . 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 HLD (hyperlipidemia)   . Hypertension   . Seasonal allergies    Past Surgical History:  Procedure Laterality Date  . ARTHOSCOPIC ROTAOR CUFF REPAIR Left 02/25/2018   Procedure: LEFT ARTHROSCOPIC ROTATOR CUFF REPAIR; SUBACROMIAL DECOMPRESSION;  Surgeon: Tania Ade, MD;  Location: [facility];  Service: Orthopedics;  Laterality: Left;  . CATARACT EXTRACTION W/PHACO Right 09/03/2016   Procedure: CATARACT EXTRACTION PHACO AND INTRAOCULAR LENS PLACEMENT ([place]) RIGHT DIABETIC;  Surgeon: Leandrew Koyanagi, MD;  Location: [facility];  Service: Ophthalmology;  Laterality: Right;  DIABETIC  . COLONOSCOPY WITH PROPOFOL N/A 08/29/2019   Procedure: COLONOSCOPY WITH PROPOFOL;  Surgeon: Lin Landsman, MD;  Location[facility];  Service: Gastroenterology;  Laterality: N/A;  . CORONARY STENT INTERVENTION N/A 12/16/2019   Procedure: CORONARY STENT INTERVENTION;  Surgeon: Curtis Hampshire, MD;  Location: [facility] CV LAB;  Service: Cardiovascular;  Laterality: N/A;  . LEFT HEART CATH AND CORONARY ANGIOGRAPHY N/A 12/16/2019   Procedure: LEFT HEART CATH AND CORONARY ANGIOGRAPHY;  Surgeon: Curtis Hampshire, MD;  Location: [facility];  Service: Cardiovascular;  Laterality: N/A;  . ROTATOR CUFF REPAIR Right  2009    Allergies  Allergies  Allergen Reactions  . Latex Rash    History of Present Illness    Curtis Singh is a 64 y.o. male with a hx of CAD s/p DES to RCA 12/16/19, DM2, hyperlipidemia, hypertension, aortic atherosclerosis last seen 12/16/19 for cardiac catheterization.  He had [facility] February 2021 with no evidence of ischemia and normal LVEF.  He was seen in clinic 12/15/2019 by Curtis Singh reporting 2 to 3-week history of worsening substernal chest pain and tightness with exertion.  He was recommended for cardiac catheterization.  He had a stat troponin which was slightly elevated and as such cardiac catheterization was performed 12/16/2019 showing severe one-vessel coronary disease 99% thrombotic stenosis in proximal/mid RCA just before tortuous segment treated with successful angioplasty and DES.  LVSF and normal LVEDP.  Recommended for DAPT aspirin and Brilinta for at least 12 months  He presents today for follow-up with his wife.  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  Very much enjoys his job.  He reports feeling an occasional small discomfort throughout in his chest since hospital discharge.  He took 2 nitroglycerin on Saturday with good relief.  He has not required nitroglycerin since that time.  Tells me this feels substantially less severe than previous symptoms.  He wonders if he is just too worried.  We reviewed his cardiac catheterization and stent intervention in detail which help provide additional clarity.  He has lost 7 pounds since seen 2 weeks ago and tells me he  has been making significant changes to his diet with the assistance of his wife.  His primary care provider manages his diabetes.  His A1c in June was 11.  Tells me he has been on Metformin XR 500 twice daily for some time and was encouraged by his primary care provider when last seen to take his insulin 30 units daily though he has been doing this sporadically.  He is  interested in additional diabetes control and his wife asks whether he should see an endocrinologist.  Since discharge she reports he reports no dyspnea on exertion, shortness of breath.  No palpitations.  No edema, orthopnea, PND.  EKGs/Labs/Other Studies Reviewed:   The following studies were reviewed today: LHC 01/12/20  Prox RCA to Mid RCA lesion is 99% stenosed.  Post intervention, there is a 0% residual stenosis.  A drug-eluting stent was successfully placed using a STENT RESOLUTE ONYX 3.0X22.  Mid RCA lesion is 20% stenosed.  The left ventricular systolic function is normal.  LV end diastolic pressure is normal.  The left ventricular ejection fraction is 55-65% by visual estimate.   1.  Severe one-vessel coronary artery disease with 99% thrombotic stenosis in proximal/mid RCA just before a tortuous segment.  No significant disease affecting the left coronary arteries. 2.  Normal LV systolic function and normal left ventricular end-diastolic pressure 3.  Successful angioplasty and drug-eluting stent placement to the right coronary artery.   Recommendations: Dual antiplatelet therapy with aspirin and ticagrelor for at least 12 months. Aggressive treatment of risk factors and cardiac rehab. The patient is a candidate for same-day discharge.  EKG:  EKG is  ordered today.  The ekg ordered today demonstrates normal sinus rhythm 74 bpm with left axis deviation low voltage QRS.  No acute ST/T wave changes.  Recent Labs: 03/02/2019: ALT 30; Magnesium 2.0 07/04/2019: TSH 1.311 12/15/2019: BUN 15; Creatinine, Ser 0.79; Hemoglobin 14.8; Platelets 269; Potassium 4.0; Sodium 138  Recent Lipid Panel    Component Value Date/Time   CHOL 182 07/04/2019 0823   CHOL 239 (H) 01/20/2014 0729   TRIG 233 (H) 07/04/2019 0823   TRIG 171 01/20/2014 0729   HDL 69 07/04/2019 0823   HDL 48 01/20/2014 0729   CHOLHDL 2.6 07/04/2019 0823   VLDL 47 (H) 07/04/2019 0823   VLDL 34 01/20/2014 0729    LDLCALC 66 07/04/2019 0823   LDLCALC 157 (H) 01/20/2014 0729   Home Medications   Current Meds  Medication Sig  . aspirin EC 81 MG tablet Take 1 tablet (81 mg total) by mouth daily.  . Blood Glucose Monitoring Suppl (FREESTYLE LITE) DEVI   . glucose blood (FREESTYLE LITE) test strip USE 1 STRIP DAILY  . Insulin Glargine (BASAGLAR KWIKPEN) 100 UNIT/ML Inject 30 Units into the skin daily.  . metFORMIN (GLUCOPHAGE-XR) 500 MG 24 hr tablet Take 1 tablet (500 mg total) by mouth 2 (two) times daily.  . metoprolol succinate (TOPROL-XL) 50 MG 24 hr tablet Take 1 tablet (50 mg total) by mouth daily. Take with or immediately following a meal.  . Multiple Vitamin (MULTIVITAMIN WITH MINERALS) TABS tablet Take 1 tablet by mouth at bedtime.  . nitroGLYCERIN (NITROSTAT) 0.4 MG SL tablet Place 1 tablet (0.4 mg total) under the tongue every 5 (five) minutes as needed.  . NONFORMULARY OR COMPOUNDED ITEM Trimix (30/1/10)-(Pap/Phent/PGE) Dosage: Inject 1.0 cc per injection Prefilled syringe : 10 syringes Qty10 Olivia Lopez de Gutierrez [phone] Fax [phone]  . omeprazole (PRILOSEC) 20 MG capsule Take 1 capsule (  20 mg total) by mouth daily.  . rosuvastatin (CRESTOR) 40 MG tablet Take 1 tablet (40 mg total) by mouth at bedtime.  . sildenafil (VIAGRA) 100 MG tablet Take 1 tablet (100 mg total) by mouth daily as needed for erectile dysfunction.  . traZODone (DESYREL) 50 MG tablet TAKE 1 TABLET BY MOUTH AT BEDTIME (Patient taking differently: Take 50 mg by mouth at bedtime.)  . UNIFINE PENTIPS 29G X 12MM MISC Use to inject insulin daily  . [DISCONTINUED] ticagrelor (BRILINTA) 90 MG TABS tablet Take 1 tablet (90 mg total) by mouth 2 (two) times daily.    Review of Systems  All other systems reviewed and are otherwise negative except as noted above.  Physical Exam    VS:  BP 140/70 (BP Location: Left Arm, Patient Position: Sitting, Cuff Size: Normal)   Pulse 74   Ht 5\' 11"  (1.803 m)   Wt 204 lb  4 oz (92.6 kg)   SpO2 98%   BMI 28.49 kg/m  , BMI Body mass index is 28.49 kg/m.  Wt Readings from Last 3 Encounters:  12/23/19 204 lb 4 oz (92.6 kg)  12/16/19 200 lb (90.7 kg)  12/15/19 211 lb 2 oz (95.8 kg)    GEN: Well nourished, well developed, in no acute distress. HEENT: normal. Neck: Supple, no JVD, carotid bruits, or masses. Cardiac: RRR, no murmurs, rubs, or gallops. No clubbing, cyanosis, edema.  Radials/DP/PT 2+ and equal bilaterally.  Respiratory:  Respirations regular and unlabored, clear to auscultation bilaterally. GI: Soft, nontender, nondistended. MS: No deformity or atrophy. Skin: Warm and dry, no rash.  Right radial catheterization site healing appropriately with no hematoma, no ecchymosis, no evidence of infection. Neuro:  Strength and sensation are intact. Psych: Normal affect.  Assessment & Plan    1. CAD s/p DES to RCA 12/16/19 - Right radial catheterization site healing appropriately.  Reports intermittent very mild chest discomfort since hospital discharge.  After reviewing his catheterization report he is reassured.  He took nitroglycerin last on Saturday.  Suspect his chest discomfort is vasospasm versus anxiety.  EKG today shows normal sinus rhythm with no acute ST/T wave changes.  GDMT includes DAPT with aspirin and Brilinta, metoprolol, high intensity statin.  Brilinta Rx and coupon provided. He is recommended for DAPT for at least 1 year and is agreeable though in the setting of his strong family history of coronary disease may benefit from continuation at the 1 year mark.  Encouraged to participate in cardiac rehab.  We will consider addition of Ozempic, as discussed below.  2. HLD, LDL goal <70 - 07/04/19 LDL 66.  Continue rosuvastatin 40 mg daily.  3. HTN -BP elevated today and noted to be routinely above goal of 130/80 while admitted.  Start losartan 12.5mg  daily.  Check in in 1 week on blood pressure via MyChart message.  Encouraged to monitor blood  pressure 3 times per week at home.  4. DM2 - 07/04/19 A1c 11.  He is presently on Metformin XR 500 mg twice daily.  He is also on Lantus 30 units which is prescribed daily but he takes periodically.  May benefit from addition of SGLT2 I such as Ozempic for optimal glucose control and cardiovascular protective benefit. Collect A1c today and coordinate further management with PCP.   Disposition: Follow up in 2 month(s) with Curtis Singh or APP.    Signed, Loel Dubonnet, NP 12/23/2019, 4:15 PM [place]

## 2019-12-24 LAB — HEMOGLOBIN A1C
Est. average glucose Bld gHb Est-mCnc: 252 mg/dL
Hgb A1c MFr Bld: 10.4 % — ABNORMAL HIGH (ref 4.8–5.6)

## 2019-12-28 ENCOUNTER — Ambulatory Visit: Payer: No Typology Code available for payment source | Admitting: Cardiovascular Disease

## 2019-12-28 ENCOUNTER — Telehealth (HOSPITAL_COMMUNITY): Payer: Self-pay | Admitting: Pharmacist

## 2019-12-28 NOTE — Telephone Encounter (Signed)
Pharmacy Transitions of Care Follow-up Telephone Call  Date of discharge: 12/16/2019 Discharge Diagnosis: Coronary Artery Stent Placement  How have you been since you were released from the hospital? Doing well, noticed SOB with Brilinta advised using small amount of caffeine if SOB problematic.  Used nitro once the day after hospitalization.  Was effective and did not experience side effects.  Understands how and when to use nitro and what to do if 3 doses not effective  Medication changes made at discharge: Yes  Medication changes obtained and verified? Yes    Medication Accessibility:  Home Pharmacy: [facility]  Was the patient provided with refills on discharged medications? Yes   Have all prescriptions been transferred from [facility] to home pharmacy? Yes   Is the patient able to afford medications? Yes     Medication Review:  TICAGRELOR (BRILINTA) Ticagrelor 90 mg BID initiated on.  12/16/2019 - Discussed importance of taking medication around the same time every day, - Reviewed potential DDIs with patient - Advised patient of medications to avoid (NSAIDs, aspirin maintenance doses>100 mg daily) - Educated that Tylenol (acetaminophen) will be the preferred analgesic to prevent risk of bleeding  - Emphasized importance of monitoring for signs and symptoms of bleeding (abnormal bruising, prolonged bleeding, nose bleeds, bleeding from gums, discolored urine, black tarry stools)  - Educated patient to notify doctor if shortness of breath or abnormal heartbeat occur - Advised patient to alert all providers of antiplatelet therapy prior to starting a new medication or having a procedure   Follow-up Appointments:  PCP Hospital f/u appt confirmed? No, being followed by cardiology  Specialist Hospital f/u appt confirmed? [facility] Scheduled to see cardiology on 12/23/2019 with Dr Gilford Rile and has another follow up appt. On 02/09/20   If their condition worsens, is the pt aware to  call PCP or go to the Emergency Dept.? Yes  Final Patient Assessment: Patient doing well, understands medicines and how to take them.

## 2020-01-02 ENCOUNTER — Encounter: Payer: Self-pay | Admitting: Family

## 2020-01-11 ENCOUNTER — Other Ambulatory Visit: Payer: Self-pay | Admitting: Internal Medicine

## 2020-01-12 ENCOUNTER — Other Ambulatory Visit: Payer: Self-pay | Admitting: Internal Medicine

## 2020-01-13 ENCOUNTER — Ambulatory Visit (INDEPENDENT_AMBULATORY_CARE_PROVIDER_SITE_OTHER): Payer: No Typology Code available for payment source | Admitting: Family Medicine

## 2020-01-13 ENCOUNTER — Encounter: Payer: Self-pay | Admitting: Family Medicine

## 2020-01-13 ENCOUNTER — Other Ambulatory Visit: Payer: Self-pay | Admitting: Family Medicine

## 2020-01-13 ENCOUNTER — Other Ambulatory Visit: Payer: Self-pay

## 2020-01-13 VITALS — BP 141/72 | HR 62 | Ht 71.0 in | Wt 204.9 lb

## 2020-01-13 DIAGNOSIS — E119 Type 2 diabetes mellitus without complications: Secondary | ICD-10-CM

## 2020-01-13 LAB — GLUCOSE, POCT (MANUAL RESULT ENTRY): POC Glucose: 260 mg/dl — AB (ref 70–99)

## 2020-01-13 MED ORDER — OZEMPIC (0.25 OR 0.5 MG/DOSE) 2 MG/1.5ML ~~LOC~~ SOPN
0.5000 mg | PEN_INJECTOR | SUBCUTANEOUS | 2 refills | Status: DC
Start: 2020-01-13 — End: 2020-01-13

## 2020-01-13 NOTE — Progress Notes (Signed)
Established Patient Office Visit  SUBJECTIVE:  Subjective  Patient ID: Curtis Singh, male    DOB: 14-Mar-1955  Age: 65 y.o. MRN: [id number]  CC:  Chief Complaint  Patient presents with  . Diabetes    Patient is here for diabetes check and request starting ozempic     HPI Curtis Singh is a 65 y.o. male presenting today for     Past Medical History:  Diagnosis Date  . Coronary artery disease    (a) DES to RCA 12/2019  . Diabetes ([place])    type 2 x 14 years  . ED (erectile dysfunction)   . GERD (gastroesophageal reflux disease)   . 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 HLD (hyperlipidemia)   . Hypertension   . Seasonal allergies     Past Surgical History:  Procedure Laterality Date  . ARTHOSCOPIC ROTAOR CUFF REPAIR Left 02/25/2018   Procedure: LEFT ARTHROSCOPIC ROTATOR CUFF REPAIR; SUBACROMIAL DECOMPRESSION;  Surgeon: Tania Ade, MD;  [facility];  Service: Orthopedics;  Laterality: Left;  . CATARACT EXTRACTION W/PHACO Right 09/03/2016   Procedure: CATARACT EXTRACTION PHACO AND INTRAOCULAR LENS PLACEMENT ([place]) RIGHT DIABETIC;  Surgeon: Leandrew Koyanagi, MD;  Location: [facility];  Service: Ophthalmology;  Laterality: Right;  DIABETIC  . COLONOSCOPY WITH PROPOFOL N/A 08/29/2019   Procedure: COLONOSCOPY WITH PROPOFOL;  Surgeon: Lin Landsman, MD;  Location: [facility]OSCOPY;  Service: Gastroenterology;  Laterality: N/A;  . CORONARY STENT INTERVENTION N/A 12/16/2019   Procedure: CORONARY STENT INTERVENTION;  Surgeon: Wellington Hampshire, MD;  Location: [facility];  Service: Cardiovascular;  Laterality: N/A;  . LEFT HEART CATH AND CORONARY ANGIOGRAPHY N/A 12/16/2019   Procedure: LEFT HEART CATH AND CORONARY ANGIOGRAPHY;  Surgeon: Wellington Hampshire, MD;  Location: [facility];  Service: Cardiovascular;  Laterality: N/A;  . ROTATOR CUFF REPAIR Right 2009    Family History  Problem Relation Age of Onset  . Heart attack Father    . Heart attack Maternal Uncle   . Heart attack Maternal Uncle   . Heart attack Maternal Uncle   . Heart attack Maternal Uncle   . Colon cancer Neg Hx   . Esophageal cancer Neg Hx   . Rectal cancer Neg Hx   . Stomach cancer Neg Hx   . Kidney cancer Neg Hx   . Kidney disease Neg Hx   . Prostate cancer Neg Hx   . Urolithiasis Neg Hx     Social History   Socioeconomic History  . Marital status: Divorced    Spouse name: Not on file  . Number of children: Not on file  . Years of education: Not on file  . Highest education level: Not on file  Occupational History  . Not on file  Tobacco Use  . Smoking status: Former Smoker    Quit date: 01/06/1982    Years since quitting: 38.0  . Smokeless tobacco: Never Used  Vaping Use  . Vaping Use: Never used  Substance and Sexual Activity  . Alcohol use: Yes    Alcohol/week: 24.0 standard drinks    Types: 24 Cans of beer per week    Comment: 1 case on weekend  . Drug use: No  . Sexual activity: Not on file  Other Topics Concern  . Not on file  Social History Narrative  . Not on file   Social Determinants of Health   Financial Resource Strain: Not on file  Food  Insecurity: Not on file  Transportation Needs: Not on file  Physical Activity: Not on file  Stress: Not on file  Social Connections: Not on file  Intimate Partner Violence: Not on file     Current Outpatient Medications:  .  Semaglutide,0.25 or 0.5MG /DOS, (OZEMPIC, 0.25 OR 0.5 MG/DOSE,) 2 MG/1.5ML SOPN, Inject 0.5 mg into the skin once a week., Disp: 1.5 mL, Rfl: 2 .  aspirin EC 81 MG tablet, Take 1 tablet (81 mg total) by mouth daily., Disp: 90 tablet, Rfl: 3 .  Blood Glucose Monitoring Suppl (FREESTYLE LITE) DEVI, , Disp: , Rfl:  .  glucose blood (FREESTYLE LITE) test strip, USE 1 STRIP DAILY, Disp: 100 strip, Rfl: 12 .  Insulin Glargine (BASAGLAR KWIKPEN) 100 UNIT/ML, Inject 30 Units into the skin daily., Disp: , Rfl:  .  losartan (COZAAR) 25 MG tablet, Take 0.5  tablets (12.5 mg total) by mouth daily., Disp: 45 tablet, Rfl: 3 .  metFORMIN (GLUCOPHAGE-XR) 500 MG 24 hr tablet, Take 1 tablet (500 mg total) by mouth 2 (two) times daily., Disp: , Rfl:  .  metoprolol succinate (TOPROL-XL) 50 MG 24 hr tablet, Take 1 tablet (50 mg total) by mouth daily. Take with or immediately following a meal., Disp: 90 tablet, Rfl: 3 .  Multiple Vitamin (MULTIVITAMIN WITH MINERALS) TABS tablet, Take 1 tablet by mouth at bedtime., Disp: , Rfl:  .  nitroGLYCERIN (NITROSTAT) 0.4 MG SL tablet, Place 1 tablet (0.4 mg total) under the tongue every 5 (five) minutes as needed., Disp: 25 tablet, Rfl: 2 .  NONFORMULARY OR COMPOUNDED ITEM, Trimix (30/1/10)-(Pap/Phent/PGE) Dosage: Inject 1.0 cc per injection Prefilled syringe : 10 syringes Qty10 [place] [phone] Fax [phone], Disp: 10 each, Rfl: 6 .  omeprazole (PRILOSEC) 20 MG capsule, Take 1 capsule (20 mg total) by mouth daily., Disp: 90 capsule, Rfl: 3 .  rosuvastatin (CRESTOR) 40 MG tablet, Take 1 tablet (40 mg total) by mouth at bedtime., Disp: 90 tablet, Rfl: 3 .  sildenafil (VIAGRA) 100 MG tablet, Take 1 tablet (100 mg total) by mouth daily as needed for erectile dysfunction., Disp: 30 tablet, Rfl: 6 .  ticagrelor (BRILINTA) 90 MG TABS tablet, Take 1 tablet (90 mg total) by mouth 2 (two) times daily., Disp: 180 tablet, Rfl: 3 .  traZODone (DESYREL) 50 MG tablet, TAKE 1 TABLET BY MOUTH AT BEDTIME, Disp: 30 tablet, Rfl: 1 .  UNIFINE PENTIPS 29G X 12MM MISC, Use to inject insulin daily, Disp: 30 each, Rfl: 6   Allergies  Allergen Reactions  . Latex Rash    ROS Review of Systems  Constitutional: Negative.   HENT: Negative.   Eyes: Negative.   Respiratory: Negative.   Cardiovascular: Negative.   Neurological: Negative.   Psychiatric/Behavioral: Negative.      OBJECTIVE:    Physical Exam Vitals and nursing note reviewed.  Constitutional:      Appearance: Normal appearance.  HENT:     Right  Ear: Tympanic membrane normal.     Left Ear: Tympanic membrane normal.     Mouth/Throat:     Mouth: Mucous membranes are moist.  Pulmonary:     Effort: Pulmonary effort is normal.  Musculoskeletal:        General: Normal range of motion.     Cervical back: Normal range of motion.  Skin:    General: Skin is warm.  Neurological:     Mental Status: He is alert.  Psychiatric:        Mood and  Affect: Mood normal.     BP (!) 141/72   Pulse 62   Ht 5\' 11"  (1.803 m)   Wt 204 lb 14.4 oz (92.9 kg)   BMI 28.58 kg/m  Wt Readings from Last 3 Encounters:  01/13/20 204 lb 14.4 oz (92.9 kg)  12/23/19 204 lb 4 oz (92.6 kg)  12/16/19 200 lb (90.7 kg)    Health Maintenance Due  Topic Date Due  . Hepatitis C Screening  Never done  . PNEUMOCOCCAL POLYSACCHARIDE VACCINE AGE 57-64 HIGH RISK  Never done  . FOOT EXAM  Never done  . COVID-19 Vaccine (1) Never done  . HIV Screening  Never done  . TETANUS/TDAP  Never done  . INFLUENZA VACCINE  08/07/2019    There are no preventive care reminders to display for this patient.  CBC Latest Ref Rng & Units 12/15/2019 07/04/2019 02/23/2018  WBC 4.0 - 10.5 K/uL 6.7 6.3 5.9  Hemoglobin 13.0 - 17.0 g/dL 14.8 15.0 14.1  Hematocrit 39.0 - 52.0 % 43.7 44.6 43.4  Platelets 150 - 400 K/uL 269 233 304   CMP Latest Ref Rng & Units 12/15/2019 07/04/2019 03/02/2019  Glucose 70 - 99 mg/dL 223(H) 289(H) 253(H)  BUN 8 - 23 mg/dL 15 24(H) 17  Creatinine 0.61 - 1.24 mg/dL 0.79 0.95 0.73  Sodium 135 - 145 mmol/L 138 136 136  Potassium 3.5 - 5.1 mmol/L 4.0 4.8 4.2  Chloride 98 - 111 mmol/L 105 101 102  CO2 22 - 32 mmol/L 25 26 24   Calcium 8.9 - 10.3 mg/dL 9.7 8.9 8.9  Total Protein 6.5 - 8.1 g/dL - - 7.1  Total Bilirubin 0.3 - 1.2 mg/dL - - 0.8  Alkaline Phos 38 - 126 U/L - - 60  AST 15 - 41 U/L - - 19  ALT 0 - 44 U/L - - 30    Lab Results  Component Value Date   TSH 1.311 07/04/2019   Lab Results  Component Value Date   ALBUMIN 4.4 03/02/2019   ANIONGAP  8 12/15/2019   Lab Results  Component Value Date   CHOL 182 07/04/2019   CHOL 158 03/02/2019   CHOL 241 (H) 06/18/2017   HDL 69 07/04/2019   HDL 56 03/02/2019   HDL 53 06/18/2017   LDLCALC 66 07/04/2019   LDLCALC 69 03/02/2019   LDLCALC 158 (H) 06/18/2017   CHOLHDL 2.6 07/04/2019   CHOLHDL 2.8 03/02/2019   CHOLHDL 4.5 06/18/2017   Lab Results  Component Value Date   TRIG 233 (H) 07/04/2019   Lab Results  Component Value Date   HGBA1C 10.4 (H) 12/23/2019   HGBA1C 11.0 (H) 07/04/2019   HGBA1C 7.5 (H) 02/23/2018      ASSESSMENT & PLAN:   Problem List Items Addressed This Visit      Endocrine   Diabetes ([place]) - Primary    Pt in today with poor DM control 10.4 A1C 3 weeks ago. He had stents placed December 10th.  Plan- Ozempic 0.25 for 4 weeks then fu, Decrease current insulin from 30 units to 15 units.       Relevant Medications   Semaglutide,0.25 or 0.5MG /DOS, (OZEMPIC, 0.25 OR 0.5 MG/DOSE,) 2 MG/1.5ML SOPN   Other Relevant Orders   POCT glucose (manual entry) (Completed)      Meds ordered this encounter  Medications  . Semaglutide,0.25 or 0.5MG /DOS, (OZEMPIC, 0.25 OR 0.5 MG/DOSE,) 2 MG/1.5ML SOPN    Sig: Inject 0.5 mg into the skin once a week.  Dispense:  1.5 mL    Refill:  2      Follow-up: No follow-ups on file.    Beckie Salts, [facility] [address], [place], [place] [address]

## 2020-01-13 NOTE — Assessment & Plan Note (Signed)
Pt in today with poor DM control 10.4 A1C 3 weeks ago. He had stents placed December 10th.  Plan- Ozempic 0.25 for 4 weeks then fu, Decrease current insulin from 30 units to 15 units.

## 2020-02-09 ENCOUNTER — Ambulatory Visit: Payer: No Typology Code available for payment source | Admitting: Cardiovascular Disease

## 2020-02-09 ENCOUNTER — Other Ambulatory Visit: Payer: Self-pay | Admitting: Cardiovascular Disease

## 2020-02-09 ENCOUNTER — Other Ambulatory Visit: Payer: Self-pay

## 2020-02-09 ENCOUNTER — Encounter: Payer: Self-pay | Admitting: Cardiovascular Disease

## 2020-02-09 ENCOUNTER — Ambulatory Visit (INDEPENDENT_AMBULATORY_CARE_PROVIDER_SITE_OTHER): Payer: No Typology Code available for payment source | Admitting: Family Medicine

## 2020-02-09 ENCOUNTER — Encounter: Payer: Self-pay | Admitting: Family Medicine

## 2020-02-09 ENCOUNTER — Other Ambulatory Visit: Payer: Self-pay | Admitting: *Deleted

## 2020-02-09 VITALS — BP 138/82 | HR 65 | Ht 70.5 in | Wt 204.0 lb

## 2020-02-09 VITALS — BP 154/79 | HR 66 | Ht 70.5 in | Wt 205.6 lb

## 2020-02-09 DIAGNOSIS — E1165 Type 2 diabetes mellitus with hyperglycemia: Secondary | ICD-10-CM | POA: Diagnosis not present

## 2020-02-09 DIAGNOSIS — Z5181 Encounter for therapeutic drug level monitoring: Secondary | ICD-10-CM

## 2020-02-09 DIAGNOSIS — E119 Type 2 diabetes mellitus without complications: Secondary | ICD-10-CM | POA: Diagnosis not present

## 2020-02-09 DIAGNOSIS — I2510 Atherosclerotic heart disease of native coronary artery without angina pectoris: Secondary | ICD-10-CM | POA: Diagnosis not present

## 2020-02-09 DIAGNOSIS — I10 Essential (primary) hypertension: Secondary | ICD-10-CM | POA: Diagnosis not present

## 2020-02-09 DIAGNOSIS — IMO0002 Reserved for concepts with insufficient information to code with codable children: Secondary | ICD-10-CM

## 2020-02-09 DIAGNOSIS — E785 Hyperlipidemia, unspecified: Secondary | ICD-10-CM | POA: Diagnosis not present

## 2020-02-09 MED ORDER — METFORMIN HCL ER 500 MG PO TB24: 500.0000 mg | ORAL_TABLET | Freq: Two times a day (BID) | ORAL | 3 refills | Status: DC

## 2020-02-09 MED ORDER — LOSARTAN POTASSIUM 25 MG PO TABS: 25.0000 mg | ORAL_TABLET | Freq: Every day | ORAL | 3 refills | Status: DC

## 2020-02-09 NOTE — Progress Notes (Signed)
Established Patient Office Visit  SUBJECTIVE:  Subjective  Patient ID: Curtis Singh, male    DOB: [birth year]  Age: 65 y.o. MRN: [id number]  CC:  Chief Complaint  Patient presents with  . Diabetes    Patient checked blood sugar from home and it was 228    HPI Curtis Singh is a 65 y.o. male presenting today for     Past Medical History:  Diagnosis Date  . Coronary artery disease    (a) DES to RCA 12/2019  . Diabetes ([place])    type 2 x 14 years  . ED (erectile dysfunction)   . GERD (gastroesophageal reflux disease)   . 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 HLD (hyperlipidemia)   . Hypertension   . Seasonal allergies     Past Surgical History:  Procedure Laterality Date  . ARTHOSCOPIC ROTAOR CUFF REPAIR Left 02/25/2018   Procedure: LEFT ARTHROSCOPIC ROTATOR CUFF REPAIR; SUBACROMIAL DECOMPRESSION;  Surgeon: Tania Ade, MD;  [facility];  Service: Orthopedics;  Laterality: Left;  . CATARACT EXTRACTION W/PHACO Right 09/03/2016   Procedure: CATARACT EXTRACTION PHACO AND INTRAOCULAR LENS PLACEMENT ([place]) RIGHT DIABETIC;  Surgeon: Leandrew Koyanagi, MD;  Location: [facility];  Service: Ophthalmology;  Laterality: Right;  DIABETIC  . COLONOSCOPY WITH PROPOFOL N/A 08/29/2019   Procedure: COLONOSCOPY WITH PROPOFOL;  Surgeon: Lin Landsman, MD;  Location: [facility];  Service: Gastroenterology;  Laterality: N/A;  . CORONARY STENT INTERVENTION N/A 12/16/2019   Procedure: CORONARY STENT INTERVENTION;  Surgeon: Wellington Hampshire, MD;  Location: [facility];  Service: Cardiovascular;  Laterality: N/A;  . LEFT HEART CATH AND CORONARY ANGIOGRAPHY N/A 12/16/2019   Procedure: LEFT HEART CATH AND CORONARY ANGIOGRAPHY;  Surgeon: Wellington Hampshire, MD;  Location: [facility];  Service: Cardiovascular;  Laterality: N/A;  . ROTATOR CUFF REPAIR Right 2009    Family History  Problem Relation Age of Onset  . Heart attack Father   . Heart  attack Maternal Uncle   . Heart attack Maternal Uncle   . Heart attack Maternal Uncle   . Heart attack Maternal Uncle   . Colon cancer Neg Hx   . Esophageal cancer Neg Hx   . Rectal cancer Neg Hx   . Stomach cancer Neg Hx   . Kidney cancer Neg Hx   . Kidney disease Neg Hx   . Prostate cancer Neg Hx   . Urolithiasis Neg Hx     Social History   Socioeconomic History  . Marital status: Divorced    Spouse name: Not on file  . Number of children: Not on file  . Years of education: Not on file  . Highest education level: Not on file  Occupational History  . Not on file  Tobacco Use  . Smoking status: Former Smoker    Quit date: 01/06/1982    Years since quitting: 38.1  . Smokeless tobacco: Never Used  Vaping Use  . Vaping Use: Never used  Substance and Sexual Activity  . Alcohol use: Yes    Alcohol/week: 24.0 standard drinks    Types: 24 Cans of beer per week    Comment: 1 case on weekend  . Drug use: No  . Sexual activity: Not on file  Other Topics Concern  . Not on file  Social History Narrative  . Not on file   Social Determinants of Health   Financial Resource Strain: Not on file  Food Insecurity: Not  on file  Transportation Needs: Not on file  Physical Activity: Not on file  Stress: Not on file  Social Connections: Not on file  Intimate Partner Violence: Not on file     Current Outpatient Medications:  .  aspirin EC 81 MG tablet, Take 1 tablet (81 mg total) by mouth daily., Disp: 90 tablet, Rfl: 3 .  Blood Glucose Monitoring Suppl (FREESTYLE LITE) DEVI, , Disp: , Rfl:  .  glucose blood (FREESTYLE LITE) test strip, USE 1 STRIP DAILY, Disp: 100 strip, Rfl: 12 .  Insulin Glargine (BASAGLAR KWIKPEN) 100 UNIT/ML, Inject 30 Units into the skin daily., Disp: , Rfl:  .  losartan (COZAAR) 25 MG tablet, Take 0.5 tablets (12.5 mg total) by mouth daily., Disp: 45 tablet, Rfl: 3 .  metFORMIN (GLUCOPHAGE-XR) 500 MG 24 hr tablet, Take 1 tablet (500 mg total) by mouth 2  (two) times daily., Disp: , Rfl:  .  metoprolol succinate (TOPROL-XL) 50 MG 24 hr tablet, Take 1 tablet (50 mg total) by mouth daily. Take with or immediately following a meal., Disp: 90 tablet, Rfl: 3 .  Multiple Vitamin (MULTIVITAMIN WITH MINERALS) TABS tablet, Take 1 tablet by mouth at bedtime., Disp: , Rfl:  .  nitroGLYCERIN (NITROSTAT) 0.4 MG SL tablet, Place 1 tablet (0.4 mg total) under the tongue every 5 (five) minutes as needed., Disp: 25 tablet, Rfl: 2 .  NONFORMULARY OR COMPOUNDED ITEM, Trimix (30/1/10)-(Pap/Phent/PGE) Dosage: Inject 1.0 cc per injection Prefilled syringe : 10 syringes Qty10 [place] [phone] Fax [phone], Disp: 10 each, Rfl: 6 .  Semaglutide,0.25 or 0.5MG /DOS, (OZEMPIC, 0.25 OR 0.5 MG/DOSE,) 2 MG/1.5ML SOPN, Inject 0.5 mg into the skin once a week., Disp: 1.5 mL, Rfl: 2 .  sildenafil (VIAGRA) 100 MG tablet, Take 1 tablet (100 mg total) by mouth daily as needed for erectile dysfunction., Disp: 30 tablet, Rfl: 6 .  ticagrelor (BRILINTA) 90 MG TABS tablet, Take 1 tablet (90 mg total) by mouth 2 (two) times daily., Disp: 180 tablet, Rfl: 3 .  traZODone (DESYREL) 50 MG tablet, TAKE 1 TABLET BY MOUTH AT BEDTIME, Disp: 30 tablet, Rfl: 1 .  UNIFINE PENTIPS 29G X 12MM MISC, Use to inject insulin daily, Disp: 30 each, Rfl: 6 .  omeprazole (PRILOSEC) 20 MG capsule, Take 1 capsule (20 mg total) by mouth daily., Disp: 90 capsule, Rfl: 3 .  rosuvastatin (CRESTOR) 40 MG tablet, Take 1 tablet (40 mg total) by mouth at bedtime., Disp: 90 tablet, Rfl: 3   Allergies  Allergen Reactions  . Latex Rash    ROS Review of Systems  Constitutional: Negative.   HENT: Negative.   Respiratory: Negative.   Cardiovascular: Negative.   Musculoskeletal: Negative.   Psychiatric/Behavioral: Negative.      OBJECTIVE:    Physical Exam Vitals and nursing note reviewed.  HENT:     Head: Normocephalic.     Nose: Nose normal.     Mouth/Throat:     Mouth: Mucous  membranes are moist.  Eyes:     Pupils: Pupils are equal, round, and reactive to light.  Cardiovascular:     Rate and Rhythm: Normal rate and regular rhythm.  Pulmonary:     Effort: Pulmonary effort is normal.  Musculoskeletal:        General: Normal range of motion.     Cervical back: Normal range of motion.  Skin:    General: Skin is warm.     Capillary Refill: Capillary refill takes less than 2 seconds.  Neurological:     Mental Status: He is alert.  Psychiatric:        Mood and Affect: Mood normal.     BP (!) 154/79   Pulse 66   Ht 5' 10.5" (1.791 m)   Wt 205 lb 9.6 oz (93.3 kg)   BMI 29.08 kg/m  Wt Readings from Last 3 Encounters:  02/09/20 205 lb 9.6 oz (93.3 kg)  01/13/20 204 lb 14.4 oz (92.9 kg)  12/23/19 204 lb 4 oz (92.6 kg)    Health Maintenance Due  Topic Date Due  . Hepatitis C Screening  Never done  . PNEUMOCOCCAL POLYSACCHARIDE VACCINE AGE 104-64 HIGH RISK  Never done  . COVID-19 Vaccine (1) Never done  . FOOT EXAM  Never done  . HIV Screening  Never done  . TETANUS/TDAP  Never done  . INFLUENZA VACCINE  08/07/2019    There are no preventive care reminders to display for this patient.  CBC Latest Ref Rng & Units 12/15/2019 07/04/2019 02/23/2018  WBC 4.0 - 10.5 K/uL 6.7 6.3 5.9  Hemoglobin 13.0 - 17.0 g/dL 14.8 15.0 14.1  Hematocrit 39.0 - 52.0 % 43.7 44.6 43.4  Platelets 150 - 400 K/uL 269 233 304   CMP Latest Ref Rng & Units 12/15/2019 07/04/2019 03/02/2019  Glucose 70 - 99 mg/dL 223(H) 289(H) 253(H)  BUN 8 - 23 mg/dL 15 24(H) 17  Creatinine 0.61 - 1.24 mg/dL 0.79 0.95 0.73  Sodium 135 - 145 mmol/L 138 136 136  Potassium 3.5 - 5.1 mmol/L 4.0 4.8 4.2  Chloride 98 - 111 mmol/L 105 101 102  CO2 22 - 32 mmol/L 25 26 24   Calcium 8.9 - 10.3 mg/dL 9.7 8.9 8.9  Total Protein 6.5 - 8.1 g/dL - - 7.1  Total Bilirubin 0.3 - 1.2 mg/dL - - 0.8  Alkaline Phos 38 - 126 U/L - - 60  AST 15 - 41 U/L - - 19  ALT 0 - 44 U/L - - 30    Lab Results  Component Value  Date   TSH 1.311 07/04/2019   Lab Results  Component Value Date   ALBUMIN 4.4 03/02/2019   ANIONGAP 8 12/15/2019   Lab Results  Component Value Date   CHOL 182 07/04/2019   CHOL 158 03/02/2019   CHOL 241 (H) 06/18/2017   HDL 69 07/04/2019   HDL 56 03/02/2019   HDL 53 06/18/2017   LDLCALC 66 07/04/2019   LDLCALC 69 03/02/2019   LDLCALC 158 (H) 06/18/2017   CHOLHDL 2.6 07/04/2019   CHOLHDL 2.8 03/02/2019   CHOLHDL 4.5 06/18/2017   Lab Results  Component Value Date   TRIG 233 (H) 07/04/2019   Lab Results  Component Value Date   HGBA1C 10.4 (H) 12/23/2019   HGBA1C 11.0 (H) 07/04/2019   HGBA1C 7.5 (H) 02/23/2018      ASSESSMENT & PLAN:   Problem List Items Addressed This Visit      Endocrine   Diabetes mellitus type 2 with complications, uncontrolled ([place]) - Primary    Patient has recent glucose readings of 219, 225, 210, 181, 236, these are down 60-80 points since starting Ozempic 0.25, he has also lost 8 lbs since starting. Plan- Increase Ozempic dose to .50 q week and fu 6 weeks for A1C.         Other   Encounter for medication titration    Increasing Ozempic to 0.5 mg weekly, maintain regular insulin at 15 units daily.  No orders of the defined types were placed in this encounter.     Follow-up: No follow-ups on file.    Beckie Salts, [facility] [address], [place], [place] [address]

## 2020-02-09 NOTE — Assessment & Plan Note (Signed)
Increasing Ozempic to 0.5 mg weekly, maintain regular insulin at 15 units daily.

## 2020-02-09 NOTE — Assessment & Plan Note (Addendum)
Patient has recent glucose readings of 219, 225, 210, 181, 236, these are down 60-80 points since starting Ozempic 0.25, he has also lost 8 lbs since starting. Plan- Increase Ozempic dose to .50 q week and fu 6 weeks for A1C.

## 2020-02-09 NOTE — Patient Instructions (Signed)
Medication Instructions:  Your physician has recommended you make the following change in your medication:   INCREASE Losartan to 25 mg daily. An Rx has been sent to your pharmacy.  *If you need a refill on your cardiac medications before your next appointment, please call your pharmacy*   Lab Work: None ordered If you have labs (blood work) drawn today and your tests are completely normal, you will receive your results only by: Marland Kitchen MyChart Message (if you have MyChart) OR . A paper copy in the mail If you have any lab test that is abnormal or we need to change your treatment, we will call you to review the results.   Testing/Procedures: None ordered   Follow-Up: At [facility], you and your health needs are our priority.  As part of our continuing mission to provide you with exceptional heart care, we have created designated Provider Care Teams.  These Care Teams include your primary Cardiologist (physician) and Advanced Practice Providers (APPs -  Physician Assistants and Nurse Practitioners) who all work together to provide you with the care you need, when you need it.  We recommend signing up for the patient portal called "MyChart".  Sign up information is provided on this After Visit Summary.  MyChart is used to connect with patients for Virtual Visits (Telemedicine).  Patients are able to view lab/test results, encounter notes, upcoming appointments, etc.  Non-urgent messages can be sent to your provider as well.   To learn more about what you can do with MyChart, go to [email or web address].    Your next appointment:   6 month(s)  The format for your next appointment:   In Person  Provider:   You may see [name], MD or one of the following Advanced Practice Providers on your designated Care Team:    Murray Hodgkins, NP  Christell Faith, PA-C  Marrianne Mood, PA-C  Cadence Masthope, [place]  Laurann Montana, NP    Other Instructions N/A

## 2020-02-09 NOTE — Progress Notes (Signed)
Cardiology Office Note   Date:  02/09/2020   ID:  Curtis Singh, DOB [birth year], MRN [id number]  PCP:  Cletis Athens, MD  Cardiologist:   [name], MD  Chief Complaint  Patient presents with  . Follow-up    6 weeks  Pt states he has been doing good.      History of Present Illness: Curtis Singh is a 65 y.o. male who is here today for follow-up visit regarding coronary artery disease.   He has known history of diabetes mellitus on insulin, hyperlipidemia, essential hypertension, aortic atherosclerosis and strong family history of coronary artery disease. He is not a smoker. He was seen in December for chest pain and symptoms suggestive of unstable angina.  Troponin was mildly elevated. I proceeded with urgent cardiac catheterization which showed 99% stenosis in the proximal/mid RCA and no other obstructive disease.  Ejection fraction and LVEDP were both normal.  I performed successful angioplasty and drug-eluting stent placement to the right coronary artery. He was started on losartan during most recent follow-up visit after PCI.  He had complete resolution of chest pain since his PCI and has been doing well overall.  He does have occasional shortness of breath.  He takes his medications regularly.  He is working on improved diet glycemic control.  Past Medical History:  Diagnosis Date  . Coronary artery disease    (a) DES to RCA 12/2019  . Diabetes ([place])    type 2 x 14 years  . ED (erectile dysfunction)   . GERD (gastroesophageal reflux disease)   . 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 HLD (hyperlipidemia)   . Hypertension   . Seasonal allergies     Past Surgical History:  Procedure Laterality Date  . ARTHOSCOPIC ROTAOR CUFF REPAIR Left 02/25/2018   Procedure: LEFT ARTHROSCOPIC ROTATOR CUFF REPAIR; SUBACROMIAL DECOMPRESSION;  Surgeon: Tania Ade, MD;  Location: [facility];  Service: Orthopedics;  Laterality: Left;  . CATARACT EXTRACTION W/PHACO Right  09/03/2016   Procedure: CATARACT EXTRACTION PHACO AND INTRAOCULAR LENS PLACEMENT ([place]) RIGHT DIABETIC;  Surgeon: Leandrew Koyanagi, MD;  Location: [place];  Service: Ophthalmology;  Laterality: Right;  DIABETIC  . COLONOSCOPY WITH PROPOFOL N/A 08/29/2019   Procedure: COLONOSCOPY WITH PROPOFOL;  Surgeon: Lin Landsman, MD;  Location: [facility];  Service: Gastroenterology;  Laterality: N/A;  . CORONARY STENT INTERVENTION N/A 12/16/2019   Procedure: CORONARY STENT INTERVENTION;  Surgeon: Wellington Hampshire, MD;  Location: [place] [facility];  Service: Cardiovascular;  Laterality: N/A;  . LEFT HEART CATH AND CORONARY ANGIOGRAPHY N/A 12/16/2019   Procedure: LEFT HEART CATH AND CORONARY ANGIOGRAPHY;  Surgeon: Wellington Hampshire, MD;  Location: [place] [facility];  Service: Cardiovascular;  Laterality: N/A;  . ROTATOR CUFF REPAIR Right 2009     Current Outpatient Medications  Medication Sig Dispense Refill  . aspirin EC 81 MG tablet Take 1 tablet (81 mg total) by mouth daily. 90 tablet 3  . Blood Glucose Monitoring Suppl (FREESTYLE LITE) DEVI     . glucose blood (FREESTYLE LITE) test strip USE 1 STRIP DAILY 100 strip 12  . Insulin Glargine (BASAGLAR KWIKPEN) 100 UNIT/ML Inject 30 Units into the skin daily.    Marland Kitchen losartan (COZAAR) 25 MG tablet Take 0.5 tablets (12.5 mg total) by mouth daily. 45 tablet 3  . metFORMIN (GLUCOPHAGE-XR) 500 MG 24 hr tablet Take 1 tablet (500 mg total) by mouth 2 (two) times daily. 180 tablet 3  .  metoprolol succinate (TOPROL-XL) 50 MG 24 hr tablet Take 1 tablet (50 mg total) by mouth daily. Take with or immediately following a meal. 90 tablet 3  . Multiple Vitamin (MULTIVITAMIN WITH MINERALS) TABS tablet Take 1 tablet by mouth at bedtime.    . nitroGLYCERIN (NITROSTAT) 0.4 MG SL tablet Place 1 tablet (0.4 mg total) under the tongue every 5 (five) minutes as needed. 25 tablet 2  . NONFORMULARY OR COMPOUNDED ITEM Trimix (30/1/10)-(Pap/Phent/PGE) Dosage:  Inject 1.0 cc per injection Prefilled syringe : 10 syringes Qty10 [place] [phone] Fax [phone] 10 each 6  . Semaglutide,0.25 or 0.5MG /DOS, (OZEMPIC, 0.25 OR 0.5 MG/DOSE,) 2 MG/1.5ML SOPN Inject 0.5 mg into the skin once a week. 1.5 mL 2  . sildenafil (VIAGRA) 100 MG tablet Take 1 tablet (100 mg total) by mouth daily as needed for erectile dysfunction. 30 tablet 6  . ticagrelor (BRILINTA) 90 MG TABS tablet Take 1 tablet (90 mg total) by mouth 2 (two) times daily. 180 tablet 3  . traZODone (DESYREL) 50 MG tablet TAKE 1 TABLET BY MOUTH AT BEDTIME 30 tablet 1  . UNIFINE PENTIPS 29G X 12MM MISC Use to inject insulin daily 30 each 6  . omeprazole (PRILOSEC) 20 MG capsule Take 1 capsule (20 mg total) by mouth daily. 90 capsule 3  . rosuvastatin (CRESTOR) 40 MG tablet Take 1 tablet (40 mg total) by mouth at bedtime. 90 tablet 3   No current facility-administered medications for this visit.    Allergies:   Latex    Social History:  The patient  reports that he quit smoking about 38 years ago. He has never used smokeless tobacco. He reports current alcohol use of about 24.0 standard drinks of alcohol per week. He reports that he does not use drugs.   Family History:  The patient's family history includes Heart attack in his father, maternal uncle, maternal uncle, maternal uncle, and maternal uncle.    ROS:  Please see the history of present illness.   Otherwise, review of systems are positive for none.   All other systems are reviewed and negative.    PHYSICAL EXAM: VS:  BP 138/82   Pulse 65   Ht 5' 10.5" (1.791 m)   Wt 204 lb (92.5 kg)   BMI 28.86 kg/m  , BMI Body mass index is 28.86 kg/m. GEN: Well nourished, well developed, in no acute distress  HEENT: normal  Neck: no JVD, carotid bruits, or masses Cardiac: RRR; no  rubs, or gallops,no edema .  1/ 6 systolic murmur in the aortic area Respiratory:  clear to auscultation bilaterally, normal work of  breathing GI: soft, nontender, nondistended, + BS MS: no deformity or atrophy  Skin: warm and dry, no rash Neuro:  Strength and sensation are intact Psych: euthymic mood, full affect Radial pulse is normal.  EKG:  EKG is not ordered today.    Recent Labs: 03/02/2019: ALT 30; Magnesium 2.0 07/04/2019: TSH 1.311 12/15/2019: BUN 15; Creatinine, Ser 0.79; Hemoglobin 14.8; Platelets 269; Potassium 4.0; Sodium 138    Lipid Panel    Component Value Date/Time   CHOL 182 07/04/2019 0823   CHOL 239 (H) 01/20/2014 0729   TRIG 233 (H) 07/04/2019 0823   TRIG 171 01/20/2014 0729   HDL 69 07/04/2019 0823   HDL 48 01/20/2014 0729   CHOLHDL 2.6 07/04/2019 0823   VLDL 47 (H) 07/04/2019 0823   VLDL 34 01/20/2014 0729   LDLCALC 66 07/04/2019 0823   LDLCALC 157 (  H) 01/20/2014 0729      Wt Readings from Last 3 Encounters:  02/09/20 204 lb (92.5 kg)  02/09/20 205 lb 9.6 oz (93.3 kg)  01/13/20 204 lb 14.4 oz (92.9 kg)        ASSESSMENT AND PLAN:  1.  Coronary artery disease involving native coronary arteries without angina: He is doing very well at the present time with no anginal symptoms.  He does have occasional shortness of breath which could be due to Brilinta but it is overall mild.  The plan is to continue Brilinta until December and then discontinue.  Continue aspirin indefinitely.  2.  Hyperlipidemia: Continue rosuvastatin 40 mg daily.  Most recent lipid profile showed an LDL of 66.  3.  Essential hypertension: Blood pressure continues to be mildly elevated.  I elected to increase losartan to 25 mg daily.  4.  Diabetes mellitus: Seems to be more controlled than before.  Given his cardiac history, consider adding an SGLT2 inhibitor.   Disposition:   FU with me in 6 months  Signed, [name], MD 02/09/2020 [facility]

## 2020-03-19 ENCOUNTER — Other Ambulatory Visit: Payer: Self-pay | Admitting: Internal Medicine

## 2020-03-19 ENCOUNTER — Other Ambulatory Visit: Payer: Self-pay | Admitting: Cardiovascular Disease

## 2020-03-23 IMAGING — CR DG LUMBAR SPINE COMPLETE 4+V
1 series · 5 of 5 positions shown · non-contrast
Comparison: None.

CLINICAL DATA: Lumbago

EXAM:
LUMBAR SPINE - COMPLETE 4+ VIEW

[Series 1: dg lumbar spine complete 4 +v · 0.14mm/px · 5 of 5 slices shown]
[im 1/5]
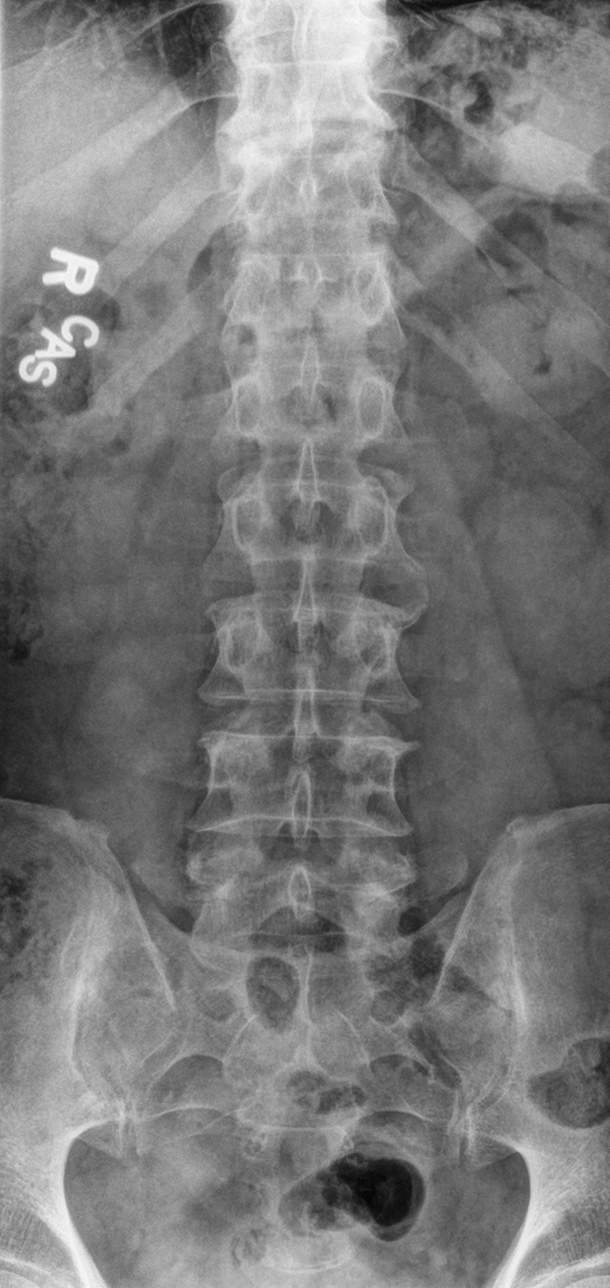
[im 2/5]
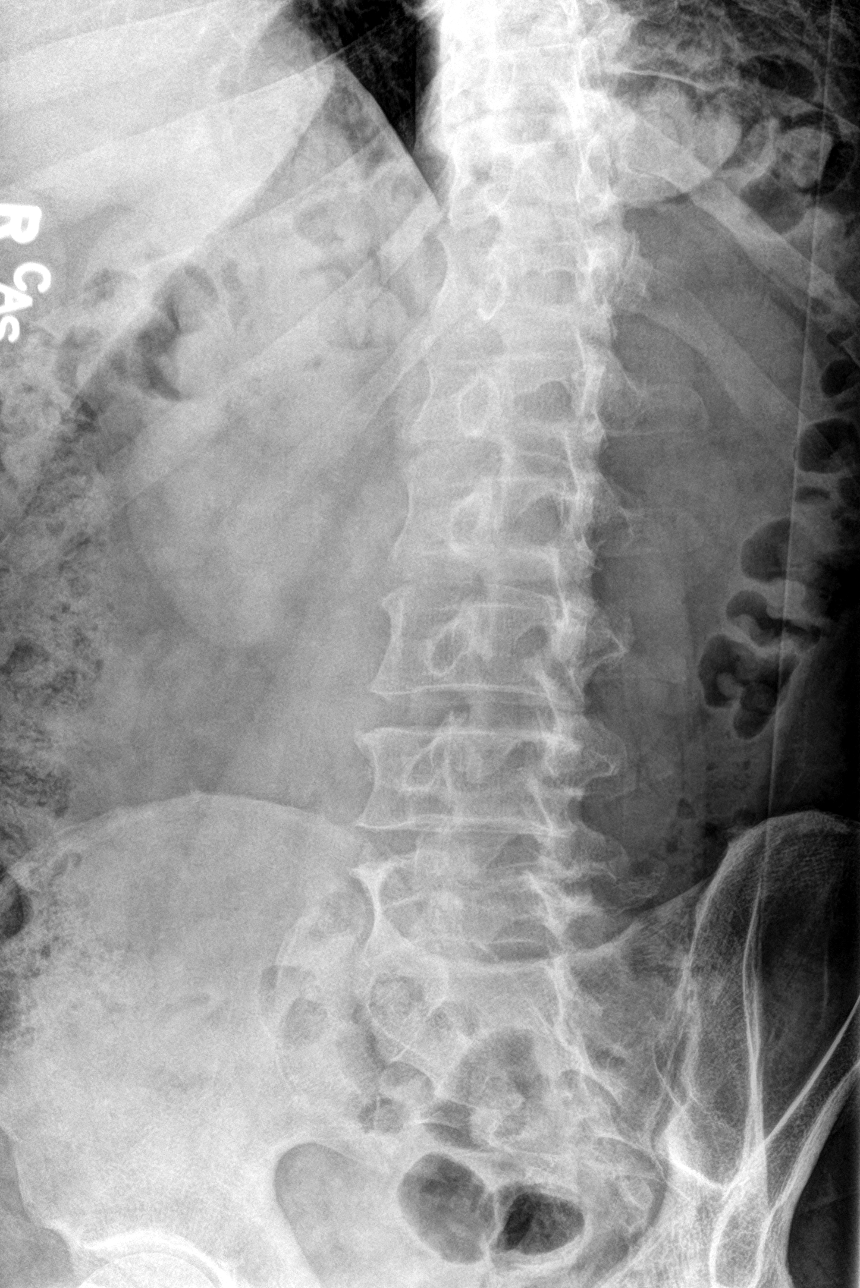
[im 3/5]
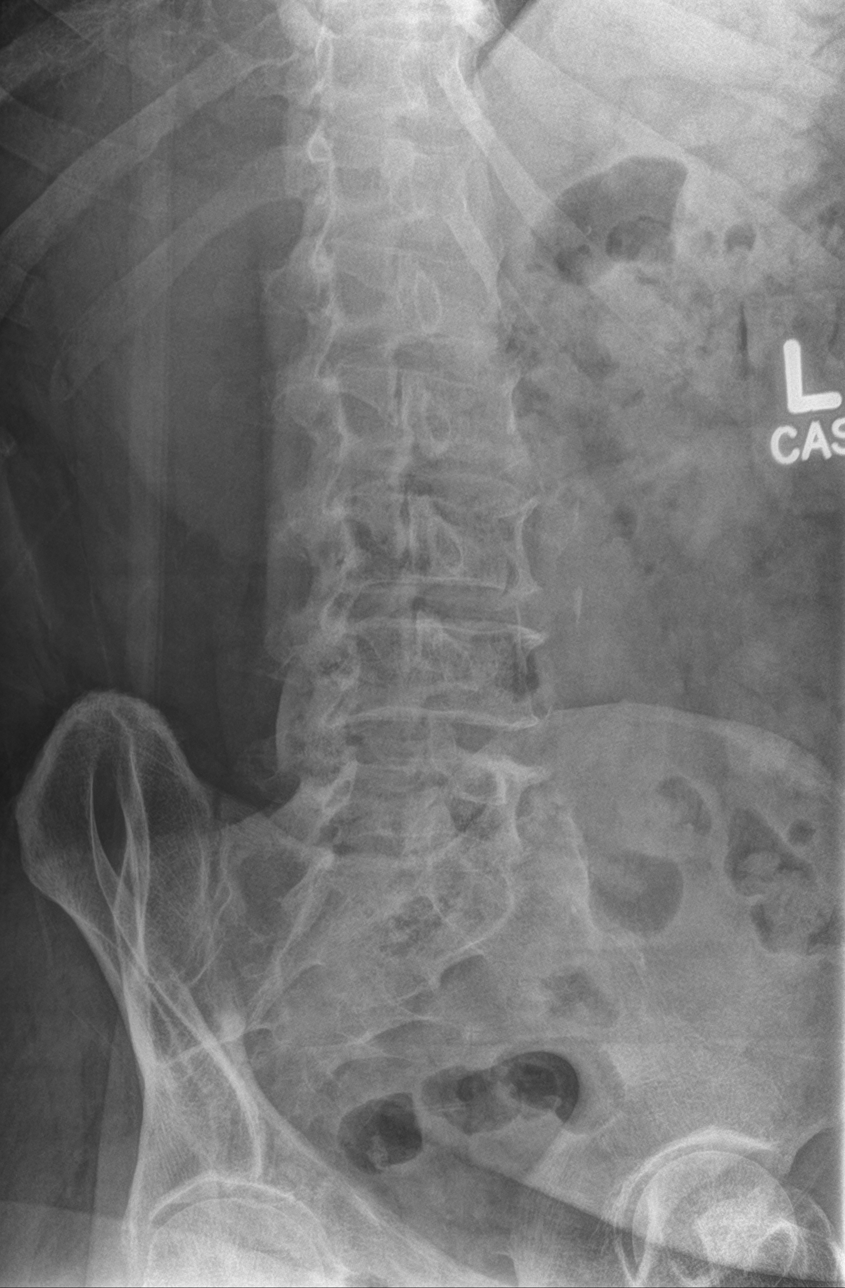
[im 4/5]
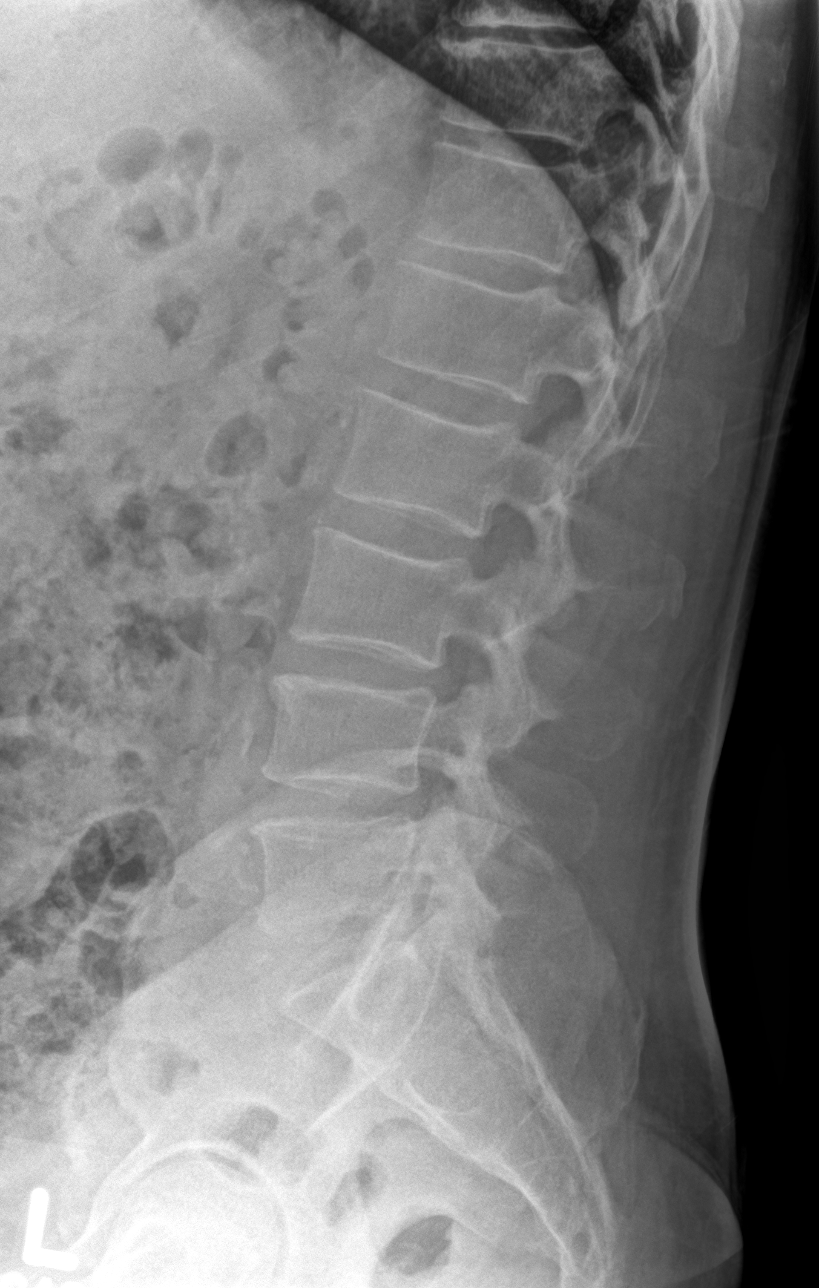
[im 5/5]
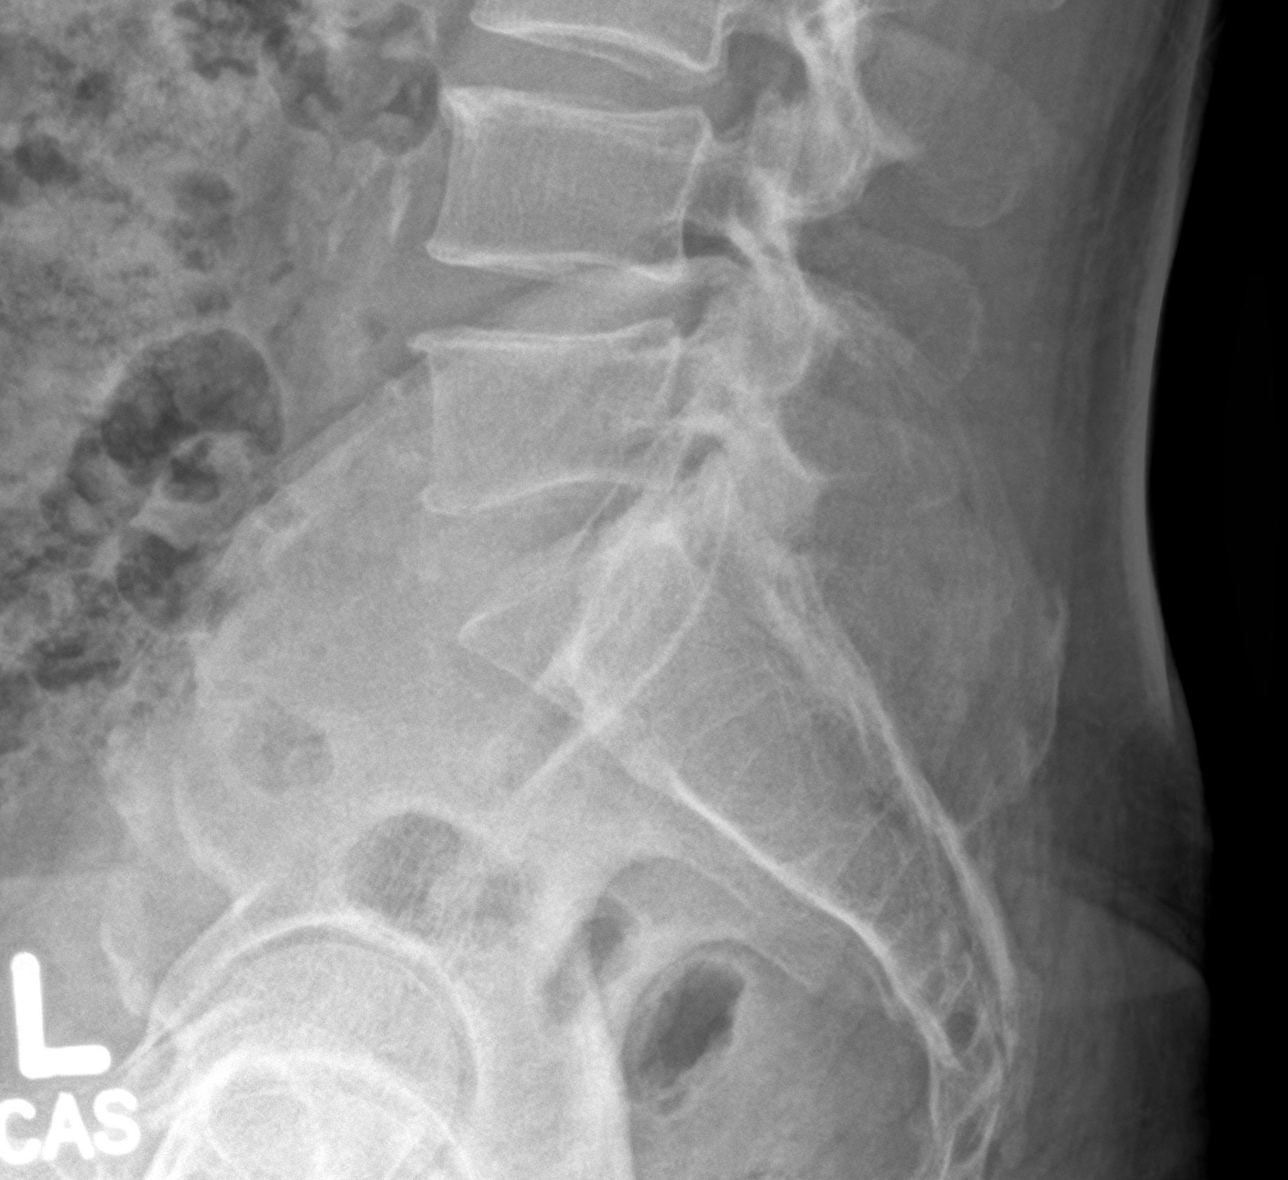

[5 of 5 positions shown; findings below may reference images not displayed]

FINDINGS: Frontal, lateral, spot lumbosacral lateral, and bilateral oblique
views were obtained. There are 5 non-rib-bearing lumbar type
vertebral bodies. There is no fracture or spondylolisthesis. Disc
spaces appear unremarkable. There is no appreciable facet
arthropathy. There is aortic atherosclerosis.
IMPRESSION: No fracture or spondylolisthesis. No appreciable arthropathy. There
is aortic atherosclerosis.

Aortic Atherosclerosis ([id number]).

## 2020-03-29 ENCOUNTER — Other Ambulatory Visit: Payer: Self-pay | Admitting: Family Medicine

## 2020-03-29 ENCOUNTER — Encounter: Payer: Self-pay | Admitting: Family Medicine

## 2020-03-29 ENCOUNTER — Ambulatory Visit (INDEPENDENT_AMBULATORY_CARE_PROVIDER_SITE_OTHER): Payer: No Typology Code available for payment source | Admitting: Family Medicine

## 2020-03-29 ENCOUNTER — Other Ambulatory Visit: Payer: Self-pay

## 2020-03-29 VITALS — BP 141/84 | HR 82 | Ht 70.5 in | Wt 204.1 lb

## 2020-03-29 DIAGNOSIS — E1165 Type 2 diabetes mellitus with hyperglycemia: Secondary | ICD-10-CM | POA: Diagnosis not present

## 2020-03-29 DIAGNOSIS — M25562 Pain in left knee: Secondary | ICD-10-CM | POA: Insufficient documentation

## 2020-03-29 DIAGNOSIS — F5101 Primary insomnia: Secondary | ICD-10-CM | POA: Diagnosis not present

## 2020-03-29 DIAGNOSIS — M25561 Pain in right knee: Secondary | ICD-10-CM

## 2020-03-29 DIAGNOSIS — IMO0002 Reserved for concepts with insufficient information to code with codable children: Secondary | ICD-10-CM

## 2020-03-29 DIAGNOSIS — E118 Type 2 diabetes mellitus with unspecified complications: Secondary | ICD-10-CM | POA: Diagnosis not present

## 2020-03-29 DIAGNOSIS — G8929 Other chronic pain: Secondary | ICD-10-CM

## 2020-03-29 LAB — POCT GLYCOSYLATED HEMOGLOBIN (HGB A1C): HbA1c POC (<> result, manual entry): 7.5 % (ref 4.0–5.6)

## 2020-03-29 MED ORDER — ZOLPIDEM TARTRATE 5 MG PO TABS: 5.0000 mg | ORAL_TABLET | Freq: Every evening | ORAL | 2 refills | Status: DC | PRN

## 2020-03-29 NOTE — Assessment & Plan Note (Signed)
PT HAS HAD SIGNIFICANT FATIGUE but has not been sleeping well less than 5 hours of sleep at night, takes trazadone and can go to sleep but he does not stay asleep.   Plan- Ambien 5 mg fu 2 months

## 2020-03-29 NOTE — Assessment & Plan Note (Signed)
Patient with years of left knee pain that has worsened over the last few months to the point he has used a brace daily. Patient is requesting an Ortho ref to discuss possible knee injections or MRI to evaluate injured ligaments.   Plan- Ref to Ortho.

## 2020-03-29 NOTE — Assessment & Plan Note (Signed)
Patient A1C was 10.4 3 months ago today 7.5. he has been taking his 0.5 mg Ozempic Weekly. Diet has improved as well.

## 2020-03-29 NOTE — Progress Notes (Signed)
Established Patient Office Visit  SUBJECTIVE:  Subjective  Patient ID: Curtis Singh, male    DOB: 1955/10/25  Age: 65 y.o. MRN: [id number]  CC:  Chief Complaint  Patient presents with  . Diabetes    HPI [name] is a 65 y.o. male presenting today for     Past Medical History:  Diagnosis Date  . Coronary artery disease    (a) DES to RCA 12/2019  . Diabetes ([place])    type 2 x 14 years  . ED (erectile dysfunction)   . GERD (gastroesophageal reflux disease)   . 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 HLD (hyperlipidemia)   . Hypertension   . Seasonal allergies     Past Surgical History:  Procedure Laterality Date  . ARTHOSCOPIC ROTAOR CUFF REPAIR Left 02/25/2018   Procedure: LEFT ARTHROSCOPIC ROTATOR CUFF REPAIR; SUBACROMIAL DECOMPRESSION;  Surgeon: Tania Ade, MD;  [facility]: [facility];  Service: Orthopedics;  Laterality: Left;  . CATARACT EXTRACTION W/PHACO Right 09/03/2016   Procedure: CATARACT EXTRACTION PHACO AND INTRAOCULAR LENS PLACEMENT ([place]) RIGHT DIABETIC;  Surgeon: Leandrew Koyanagi, MD;  Location: [place];  Service: Ophthalmology;  Laterality: Right;  DIABETIC  . COLONOSCOPY WITH PROPOFOL N/A 08/29/2019   Procedure: COLONOSCOPY WITH PROPOFOL;  Surgeon: Lin Landsman, MD;  Location: [facility] ENDOSCOPY;  Service: Gastroenterology;  Laterality: N/A;  . CORONARY STENT INTERVENTION N/A 12/16/2019   Procedure: CORONARY STENT INTERVENTION;  Surgeon: Wellington Hampshire, MD;  Location: [place] [facility];  Service: Cardiovascular;  Laterality: N/A;  . LEFT HEART CATH AND CORONARY ANGIOGRAPHY N/A 12/16/2019   Procedure: LEFT HEART CATH AND CORONARY ANGIOGRAPHY;  Surgeon: Wellington Hampshire, MD;  Location: [place] [facility];  Service: Cardiovascular;  Laterality: N/A;  . ROTATOR CUFF REPAIR Right 2009    Family History  Problem Relation Age of Onset  . Heart attack Father   . Heart attack Maternal Uncle   . Heart attack Maternal Uncle   .  Heart attack Maternal Uncle   . Heart attack Maternal Uncle   . Colon cancer Neg Hx   . Esophageal cancer Neg Hx   . Rectal cancer Neg Hx   . Stomach cancer Neg Hx   . Kidney cancer Neg Hx   . Kidney disease Neg Hx   . Prostate cancer Neg Hx   . Urolithiasis Neg Hx     Social History   Socioeconomic History  . Marital status: Divorced    Spouse name: Not on file  . Number of children: Not on file  . Years of education: Not on file  . Highest education level: Not on file  Occupational History  . Not on file  Tobacco Use  . Smoking status: Former Smoker    Quit date: 01/06/1982    Years since quitting: 38.2  . Smokeless tobacco: Never Used  Vaping Use  . Vaping Use: Never used  Substance and Sexual Activity  . Alcohol use: Yes    Alcohol/week: 24.0 standard drinks    Types: 24 Cans of beer per week    Comment: 1 case on weekend  . Drug use: No  . Sexual activity: Not on file  Other Topics Concern  . Not on file  Social History Narrative  . Not on file   Social Determinants of Health   Financial Resource Strain: Not on file  Food Insecurity: Not on file  Transportation Needs: Not on file  Physical Activity: Not on  file  Stress: Not on file  Social Connections: Not on file  Intimate Partner Violence: Not on file     Current Outpatient Medications:  .  aspirin EC 81 MG tablet, Take 1 tablet (81 mg total) by mouth daily., Disp: 90 tablet, Rfl: 3 .  Blood Glucose Monitoring Suppl (FREESTYLE LITE) DEVI, , Disp: , Rfl:  .  glucose blood (FREESTYLE LITE) test strip, USE 1 STRIP DAILY, Disp: 100 strip, Rfl: 12 .  Insulin Glargine (BASAGLAR KWIKPEN) 100 UNIT/ML, Inject 30 Units into the skin daily., Disp: , Rfl:  .  losartan (COZAAR) 25 MG tablet, Take 1 tablet (25 mg total) by mouth daily., Disp: 90 tablet, Rfl: 3 .  metFORMIN (GLUCOPHAGE-XR) 500 MG 24 hr tablet, Take 1 tablet (500 mg total) by mouth 2 (two) times daily., Disp: 180 tablet, Rfl: 3 .  metoprolol succinate  (TOPROL-XL) 50 MG 24 hr tablet, Take 1 tablet (50 mg total) by mouth daily. Take with or immediately following a meal., Disp: 90 tablet, Rfl: 3 .  Multiple Vitamin (MULTIVITAMIN WITH MINERALS) TABS tablet, Take 1 tablet by mouth at bedtime., Disp: , Rfl:  .  nitroGLYCERIN (NITROSTAT) 0.4 MG SL tablet, Place 1 tablet (0.4 mg total) under the tongue every 5 (five) minutes as needed., Disp: 25 tablet, Rfl: 2 .  NONFORMULARY OR COMPOUNDED ITEM, Trimix (30/1/10)-(Pap/Phent/PGE) Dosage: Inject 1.0 cc per injection Prefilled syringe : 10 syringes Qty10 [place] [phone] Fax [phone], Disp: 10 each, Rfl: 6 .  rosuvastatin (CRESTOR) 40 MG tablet, TAKE 1 TABLET (40 MG TOTAL) BY MOUTH AT BEDTIME., Disp: 90 tablet, Rfl: 3 .  Semaglutide,0.25 or 0.5MG /DOS, (OZEMPIC, 0.25 OR 0.5 MG/DOSE,) 2 MG/1.5ML SOPN, Inject 0.5 mg into the skin once a week., Disp: 1.5 mL, Rfl: 2 .  sildenafil (VIAGRA) 100 MG tablet, Take 1 tablet (100 mg total) by mouth daily as needed for erectile dysfunction., Disp: 30 tablet, Rfl: 6 .  ticagrelor (BRILINTA) 90 MG TABS tablet, Take 1 tablet (90 mg total) by mouth 2 (two) times daily., Disp: 180 tablet, Rfl: 3 .  traZODone (DESYREL) 50 MG tablet, TAKE 1 TABLET BY MOUTH AT BEDTIME, Disp: 30 tablet, Rfl: 1 .  UNIFINE PENTIPS 29G X 12MM MISC, Use to inject insulin daily, Disp: 30 each, Rfl: 6 .  zolpidem (AMBIEN) 5 MG tablet, Take 1 tablet (5 mg total) by mouth at bedtime as needed for sleep., Disp: 15 tablet, Rfl: 2 .  omeprazole (PRILOSEC) 20 MG capsule, Take 1 capsule (20 mg total) by mouth daily., Disp: 90 capsule, Rfl: 3   Allergies  Allergen Reactions  . Latex Rash    ROS Review of Systems  Constitutional: Positive for fatigue.  HENT: Negative.   Respiratory: Negative.  Negative for chest tightness.   Cardiovascular: Negative.   Gastrointestinal: Negative.   Genitourinary: Negative.   Musculoskeletal: Positive for arthralgias.   Psychiatric/Behavioral: Negative.      OBJECTIVE:    Physical Exam Constitutional:      Appearance: Normal appearance.  HENT:     Head: Normocephalic and atraumatic.     Mouth/Throat:     Mouth: Mucous membranes are moist.  Cardiovascular:     Rate and Rhythm: Normal rate and regular rhythm.  Skin:    General: Skin is warm.     Capillary Refill: Capillary refill takes less than 2 seconds.  Neurological:     Mental Status: He is alert.  Psychiatric:        Mood and Affect:  Mood normal.     BP (!) 141/84   Pulse 82   Ht 5' 10.5" (1.791 m)   Wt 204 lb 1.6 oz (92.6 kg)   BMI 28.87 kg/m  Wt Readings from Last 3 Encounters:  03/29/20 204 lb 1.6 oz (92.6 kg)  02/09/20 204 lb (92.5 kg)  02/09/20 205 lb 9.6 oz (93.3 kg)    Health Maintenance Due  Topic Date Due  . Hepatitis C Screening  Never done  . PNEUMOCOCCAL POLYSACCHARIDE VACCINE AGE 58-64 HIGH RISK  Never done  . COVID-19 Vaccine (1) Never done  . FOOT EXAM  Never done  . HIV Screening  Never done  . TETANUS/TDAP  Never done  . INFLUENZA VACCINE  08/07/2019    There are no preventive care reminders to display for this patient.  CBC Latest Ref Rng & Units 12/15/2019 07/04/2019 02/23/2018  WBC 4.0 - 10.5 K/uL 6.7 6.3 5.9  Hemoglobin 13.0 - 17.0 g/dL 14.8 15.0 14.1  Hematocrit 39.0 - 52.0 % 43.7 44.6 43.4  Platelets 150 - 400 K/uL 269 233 304   CMP Latest Ref Rng & Units 12/15/2019 07/04/2019 03/02/2019  Glucose 70 - 99 mg/dL 223(H) 289(H) 253(H)  BUN 8 - 23 mg/dL 15 24(H) 17  Creatinine 0.61 - 1.24 mg/dL 0.79 0.95 0.73  Sodium 135 - 145 mmol/L 138 136 136  Potassium 3.5 - 5.1 mmol/L 4.0 4.8 4.2  Chloride 98 - 111 mmol/L 105 101 102  CO2 22 - 32 mmol/L 25 26 24   Calcium 8.9 - 10.3 mg/dL 9.7 8.9 8.9  Total Protein 6.5 - 8.1 g/dL - - 7.1  Total Bilirubin 0.3 - 1.2 mg/dL - - 0.8  Alkaline Phos 38 - 126 U/L - - 60  AST 15 - 41 U/L - - 19  ALT 0 - 44 U/L - - 30    Lab Results  Component Value Date   TSH 1.311  07/04/2019   Lab Results  Component Value Date   ALBUMIN 4.4 03/02/2019   ANIONGAP 8 12/15/2019   Lab Results  Component Value Date   CHOL 182 07/04/2019   CHOL 158 03/02/2019   CHOL 241 (H) 06/18/2017   HDL 69 07/04/2019   HDL 56 03/02/2019   HDL 53 06/18/2017   LDLCALC 66 07/04/2019   LDLCALC 69 03/02/2019   LDLCALC 158 (H) 06/18/2017   CHOLHDL 2.6 07/04/2019   CHOLHDL 2.8 03/02/2019   CHOLHDL 4.5 06/18/2017   Lab Results  Component Value Date   TRIG 233 (H) 07/04/2019   Lab Results  Component Value Date   HGBA1C 7.5 03/29/2020   HGBA1C 10.4 (H) 12/23/2019   HGBA1C 11.0 (H) 07/04/2019      ASSESSMENT & PLAN:   Problem List Items Addressed This Visit      Endocrine   Diabetes mellitus type 2 with complications, uncontrolled ([place]) - Primary    Patient A1C was 10.4 3 months ago today 7.5. he has been taking his 0.5 mg Ozempic Weekly. Diet has improved as well.       Relevant Orders   POCT HgB A1C (Completed)     Other   Left knee pain    Patient with years of left knee pain that has worsened over the last few months to the point he has used a brace daily. Patient is requesting an Ortho ref to discuss possible knee injections or MRI to evaluate injured ligaments.   Plan- Ref to Ortho.       Primary  insomnia    PT HAS HAD SIGNIFICANT FATIGUE but has not been sleeping well less than 5 hours of sleep at night, takes trazadone and can go to sleep but he does not stay asleep.   Plan- Ambien 5 mg fu 2 months         Meds ordered this encounter  Medications  . zolpidem (AMBIEN) 5 MG tablet    Sig: Take 1 tablet (5 mg total) by mouth at bedtime as needed for sleep.    Dispense:  15 tablet    Refill:  2      Follow-up: No follow-ups on file.    Beckie Salts, [place] [address], [place], [place] [address]

## 2020-04-09 ENCOUNTER — Other Ambulatory Visit: Payer: Self-pay

## 2020-04-09 DIAGNOSIS — M25562 Pain in left knee: Secondary | ICD-10-CM

## 2020-04-09 MED ORDER — ZOLPIDEM TARTRATE 10 MG PO TABS: 10.0000 mg | ORAL_TABLET | Freq: Every evening | ORAL | 1 refills | Status: DC | PRN

## 2020-04-09 MED FILL — Zolpidem Tartrate Tab 5 MG: ORAL | 15 days supply | Qty: 15 | Fill #0 | Status: AC

## 2020-04-09 MED FILL — Semaglutide Soln Pen-inj 0.25 or 0.5 MG/DOSE (2 MG/1.5ML): SUBCUTANEOUS | 28 days supply | Qty: 1.5 | Fill #0 | Status: AC

## 2020-04-09 NOTE — Progress Notes (Signed)
amb ref °

## 2020-04-10 ENCOUNTER — Other Ambulatory Visit: Payer: Self-pay

## 2020-04-11 ENCOUNTER — Other Ambulatory Visit: Payer: Self-pay

## 2020-04-11 ENCOUNTER — Other Ambulatory Visit: Payer: Self-pay | Admitting: Internal Medicine

## 2020-04-11 MED ORDER — ZOLPIDEM TARTRATE 10 MG PO TABS
10.0000 mg | ORAL_TABLET | Freq: Every evening | ORAL | 1 refills | Status: DC | PRN
Start: 2020-04-11 — End: 2020-06-14
  Filled 2020-04-11: qty 30, 30d supply, fill #0
  Filled 2020-05-16: qty 30, 30d supply, fill #1

## 2020-04-17 ENCOUNTER — Other Ambulatory Visit: Payer: Self-pay

## 2020-04-22 ENCOUNTER — Other Ambulatory Visit: Payer: Self-pay

## 2020-04-22 ENCOUNTER — Telehealth: Payer: Self-pay | Admitting: Physician Assistant

## 2020-04-22 ENCOUNTER — Emergency Department: Payer: No Typology Code available for payment source

## 2020-04-22 ENCOUNTER — Emergency Department
Admission: EM | Admit: 2020-04-22 | Discharge: 2020-04-22 | Disposition: A | Discharge status: Home / Self Care | Payer: No Typology Code available for payment source | Attending: Emergency Medicine | Admitting: Emergency Medicine

## 2020-04-22 DIAGNOSIS — Z87891 Personal history of nicotine dependence: Secondary | ICD-10-CM | POA: Diagnosis not present

## 2020-04-22 DIAGNOSIS — R0602 Shortness of breath: Secondary | ICD-10-CM | POA: Diagnosis not present

## 2020-04-22 DIAGNOSIS — Z79899 Other long term (current) drug therapy: Secondary | ICD-10-CM | POA: Insufficient documentation

## 2020-04-22 DIAGNOSIS — R109 Unspecified abdominal pain: Secondary | ICD-10-CM | POA: Diagnosis present

## 2020-04-22 DIAGNOSIS — I25110 Atherosclerotic heart disease of native coronary artery with unstable angina pectoris: Secondary | ICD-10-CM | POA: Diagnosis not present

## 2020-04-22 DIAGNOSIS — Z7984 Long term (current) use of oral hypoglycemic drugs: Secondary | ICD-10-CM | POA: Insufficient documentation

## 2020-04-22 DIAGNOSIS — Z91040 Latex allergy status: Secondary | ICD-10-CM | POA: Insufficient documentation

## 2020-04-22 DIAGNOSIS — Z794 Long term (current) use of insulin: Secondary | ICD-10-CM | POA: Insufficient documentation

## 2020-04-22 DIAGNOSIS — Z7982 Long term (current) use of aspirin: Secondary | ICD-10-CM | POA: Diagnosis not present

## 2020-04-22 DIAGNOSIS — I10 Essential (primary) hypertension: Secondary | ICD-10-CM | POA: Insufficient documentation

## 2020-04-22 DIAGNOSIS — E119 Type 2 diabetes mellitus without complications: Secondary | ICD-10-CM | POA: Insufficient documentation

## 2020-04-22 LAB — TROPONIN I (HIGH SENSITIVITY)
Troponin I (High Sensitivity): 4 ng/L (ref <18)
Troponin I (High Sensitivity): 5 ng/L (ref <18)

## 2020-04-22 LAB — URINALYSIS, COMPLETE (UACMP) WITH MICROSCOPIC
Bacteria, UA: NONE SEEN
Bilirubin Urine: NEGATIVE
Glucose, UA: >=500 mg/dL — AB
Hgb urine dipstick: NEGATIVE
Ketones, ur: NEGATIVE mg/dL
Leukocytes,Ua: NEGATIVE
Nitrite: NEGATIVE
Protein, ur: NEGATIVE mg/dL
Specific Gravity, Urine: 1.008 (ref 1.005–1.030)
WBC, UA: NONE SEEN WBC/hpf (ref 0–5)
pH: 6 (ref 5.0–8.0)

## 2020-04-22 LAB — BASIC METABOLIC PANEL
Anion gap: 9 (ref 5–15)
BUN: 13 mg/dL (ref 8–23)
CO2: 24 mmol/L (ref 22–32)
Calcium: 9.2 mg/dL (ref 8.9–10.3)
Chloride: 101 mmol/L (ref 98–111)
Creatinine, Ser: 0.93 mg/dL (ref 0.61–1.24)
GFR, Estimated: >60 mL/min (ref >60)
Glucose, Bld: 214 mg/dL — ABNORMAL HIGH (ref 70–99)
Potassium: 4.2 mmol/L (ref 3.5–5.1)
Sodium: 134 mmol/L — ABNORMAL LOW (ref 135–145)

## 2020-04-22 LAB — CBC
HCT: 44.4 % (ref 39.0–52.0)
Hemoglobin: 15.2 g/dL (ref 13.0–17.0)
MCH: 31.1 pg (ref 26.0–34.0)
MCHC: 34.2 g/dL (ref 30.0–36.0)
MCV: 91 fL (ref 80.0–100.0)
Platelets: 291 10*3/uL (ref 150–400)
RBC: 4.88 MIL/uL (ref 4.22–5.81)
RDW: 12.6 % (ref 11.5–15.5)
WBC: 8.6 10*3/uL (ref 4.0–10.5)
nRBC: 0 % (ref 0.0–0.2)

## 2020-04-22 NOTE — ED Triage Notes (Signed)
Pt c/o intermittent chest pain for the past week , states today has had them every 68min or so with SOB, states he had similar pain when he had to have cardiac stent placed in December. Pt denies kidney stones. Skin is warm and dry, pt is in  NAD

## 2020-04-22 NOTE — ED Provider Notes (Signed)
[facility] Emergency Department Provider Note   ____________________________________________   Event Date/Time   First MD Initiated Contact with Patient 04/22/20 1826     (approximate)  I have reviewed the triage vital signs and the nursing notes.   HISTORY  Chief Complaint Flank Pain and Shortness of Breath    HPI Curtis Singh is a 65 y.o. male with a stated past medical history of ACS status post stent placement on 12/21, type 2 diabetes, and hypertension who presents for right flank pain.  Patient states that he has had intermittent sharp, nonradiating, right flank pain that has been intermittent for the past week and associated with shortness of breath.  Patient denies any exacerbating or relieving factors but states that this pain occurs suddenly and last approximately 15 seconds before dissipating.  Patient has not tried any medications for this pain.  Patient states that he has similar pain prior to a stent being placed and did not have any further pain after this was placed until approximately a week ago.  Patient currently denies any vision changes, tinnitus, difficulty speaking, facial droop, sore throat, chest pain, shortness of breath, nausea/vomiting/diarrhea, dysuria, or weakness/numbness/paresthesias in any extremity         Past Medical History:  Diagnosis Date  . Coronary artery disease    (a) DES to RCA 12/2019  . Diabetes ([place])    type 2 x 14 years  . ED (erectile dysfunction)   . GERD (gastroesophageal reflux disease)   . 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 HLD (hyperlipidemia)   . Hypertension   . Seasonal allergies     Patient Active Problem List   Diagnosis Date Noted  . Left knee pain 03/29/2020  . Primary insomnia 03/29/2020  . Encounter for medication titration 02/09/2020  . Unstable angina ([place])   . Chest discomfort 12/12/2019  . History of colonic polyps   . Routine adult health maintenance 07/01/2019   . ED (erectile dysfunction) of organic origin 02/21/2015  . Diabetes mellitus type 2 with complications, uncontrolled ([place]) 10/18/2014  . Hyperlipemia 10/18/2014    Past Surgical History:  Procedure Laterality Date  . ARTHOSCOPIC ROTAOR CUFF REPAIR Left 02/25/2018   Procedure: LEFT ARTHROSCOPIC ROTATOR CUFF REPAIR; SUBACROMIAL DECOMPRESSION;  Surgeon: Tania Ade, MD;  Location: [facility];  Service: Orthopedics;  Laterality: Left;  . CATARACT EXTRACTION W/PHACO Right 09/03/2016   Procedure: CATARACT EXTRACTION PHACO AND INTRAOCULAR LENS PLACEMENT ([place]) RIGHT DIABETIC;  Surgeon: Leandrew Koyanagi, MD;  Location: [place];  Service: Ophthalmology;  Laterality: Right;  DIABETIC  . COLONOSCOPY WITH PROPOFOL N/A 08/29/2019   Procedure: COLONOSCOPY WITH PROPOFOL;  Surgeon: Lin Landsman, MD;  Location: [facility];  Service: Gastroenterology;  Laterality: N/A;  . CORONARY STENT INTERVENTION N/A 12/16/2019   Procedure: CORONARY STENT INTERVENTION;  Surgeon: Wellington Hampshire, MD;  Location: [place] [facility];  Service: Cardiovascular;  Laterality: N/A;  . LEFT HEART CATH AND CORONARY ANGIOGRAPHY N/A 12/16/2019   Procedure: LEFT HEART CATH AND CORONARY ANGIOGRAPHY;  Surgeon: Wellington Hampshire, MD;  Location: [place] [facility];  Service: Cardiovascular;  Laterality: N/A;  . ROTATOR CUFF REPAIR Right 2009    Prior to Admission medications   Medication Sig Start Date End Date Taking? Authorizing Provider  aspirin EC 81 MG tablet Take 1 tablet (81 mg total) by mouth daily. 02/09/18   Wellington Hampshire, MD  Blood Glucose Monitoring Suppl (FREESTYLE LITE) DEVI  03/05/18  [provider]  glucose blood test strip USE 1 STRIP DAILY 08/09/19 08/08/20  Cletis Athens, MD  Insulin Glargine (BASAGLAR KWIKPEN) 100 UNIT/ML Inject 30 Units into the skin daily.    [provider]  Insulin Pen Needle 31G X 5 MM MISC USE TO INJECT INSULIN DAILY 08/11/19 08/10/20  Cletis Athens, MD   losartan (COZAAR) 25 MG tablet TAKE 1 TABLET BY MOUTH DAILY. 02/09/20 02/08/21  Wellington Hampshire, MD  metFORMIN (GLUCOPHAGE-XR) 500 MG 24 hr tablet TAKE 1 TABLET BY MOUTH TWICE DAILY 02/09/20 02/08/21  Beckie Salts, FNP  metFORMIN (GLUCOPHAGE-XR) 500 MG 24 hr tablet TAKE 1 TABLET BY MOUTH TWICE DAILY 04/25/19 04/24/20  Cletis Athens, MD  metoprolol succinate (TOPROL-XL) 50 MG 24 hr tablet TAKE 1 TABLET BY MOUTH DAILY. TAKE WITH OR IMMEDIATELY FOLLOWING A MEAL. 07/12/19 07/11/20  Cletis Athens, MD  Multiple Vitamin (MULTIVITAMIN WITH MINERALS) TABS tablet Take 1 tablet by mouth at bedtime.    [provider]  neomycin-polymyxin b-dexamethasone (MAXITROL) 3.5-10000-0.1 SUSP PLACE ONE DROP IN THE SURGICAL EYE 4 TIMES A DAY FOR 2 WEEKS THEN STOP 10/28/19 10/27/20  Karle Starch, MD  nitroGLYCERIN (NITROSTAT) 0.4 MG SL tablet PLACE 1 TABLET (0.4 MG TOTAL) UNDER THE TONGUE EVERY 5 (FIVE) MINUTES AS NEEDED. 12/16/19 12/15/20  Reino Bellis B, NP  NONFORMULARY OR COMPOUNDED ITEM Trimix (30/1/10)-(Pap/Phent/PGE) Dosage: Inject 1.0 cc per injection Prefilled syringe : 10 syringes Qty[employer] [place] [phone] Fax [phone] 07/08/18   Zara Council A, PA-C  omeprazole (PRILOSEC) 20 MG capsule TAKE 1 CAPSULE BY MOUTH DAILY. 07/22/19 07/21/20  Cletis Athens, MD  prednisoLONE acetate (PRED FORTE) 1 % ophthalmic suspension APPLY 1 DROP INTO RIGHT EYE THREE TIMES A DAY 12/23/19 12/22/20  Porfilio, Gwyndolyn Saxon, MD  rosuvastatin (CRESTOR) 40 MG tablet TAKE 1 TABLET (40 MG TOTAL) BY MOUTH AT BEDTIME. 03/19/20 03/19/21  Wellington Hampshire, MD  Semaglutide,0.25 or 0.5MG /DOS, 2 MG/1.5ML SOPN INJECT 0.5 MG INTO THE SKIN ONCE A WEEK 01/13/20 01/12/21  Beckie Salts, FNP  sildenafil (VIAGRA) 100 MG tablet Take 1 tablet (100 mg total) by mouth daily as needed for erectile dysfunction. 08/09/18   McGowan, Hunt Oris, PA-C  ticagrelor (BRILINTA) 90 MG TABS tablet TAKE 1 TABLET (90 MG TOTAL) BY MOUTH 2 (TWO) TIMES  DAILY. 12/23/19 12/22/20  Loel Dubonnet, NP  traZODone (DESYREL) 50 MG tablet TAKE 1 TABLET BY MOUTH AT BEDTIME 03/19/20 03/19/21  Cletis Athens, MD  UNIFINE PENTIPS 29G X 12MM MISC Use to inject insulin daily 08/11/19   Cletis Athens, MD  zolpidem (AMBIEN) 10 MG tablet Take 1 tablet (10 mg total) by mouth at bedtime as needed for sleep. 04/11/20 05/17/20  Cletis Athens, MD    Allergies Latex  Family History  Problem Relation Age of Onset  . Heart attack Father   . Heart attack Maternal Uncle   . Heart attack Maternal Uncle   . Heart attack Maternal Uncle   . Heart attack Maternal Uncle   . Colon cancer Neg Hx   . Esophageal cancer Neg Hx   . Rectal cancer Neg Hx   . Stomach cancer Neg Hx   . Kidney cancer Neg Hx   . Kidney disease Neg Hx   . Prostate cancer Neg Hx   . Urolithiasis Neg Hx     Social History Social History   Tobacco Use  . Smoking status: Former Smoker    Quit date: 01/06/1982    Years since quitting: 38.3  .  Smokeless tobacco: Never Used  Vaping Use  . Vaping Use: Never used  Substance Use Topics  . Alcohol use: Yes    Alcohol/week: 24.0 standard drinks    Types: 24 Cans of beer per week    Comment: 1 case on weekend  . Drug use: No    Review of Systems Constitutional: No fever/chills Eyes: No visual changes. ENT: No sore throat. Cardiovascular: Denies chest pain. Respiratory: Denies shortness of breath. Gastrointestinal: Endorses right flank pain.  No nausea, no vomiting.  No diarrhea. Genitourinary: Negative for dysuria. Musculoskeletal: Negative for acute arthralgias Skin: Negative for rash. Neurological: Negative for headaches, weakness/numbness/paresthesias in any extremity Psychiatric: Negative for suicidal ideation/homicidal ideation   ____________________________________________   PHYSICAL EXAM:  VITAL SIGNS: ED Triage Vitals  Enc Vitals Group     BP 04/22/20 1327 (!) 150/72     Pulse Rate 04/22/20 1327 75     Resp 04/22/20 1327  18     Temp 04/22/20 1327 98.2 F (36.8 C)     Temp Source 04/22/20 1327 Oral     SpO2 04/22/20 1327 98 %     Weight 04/22/20 1328 195 lb (88.5 kg)     Height 04/22/20 1328 5\' 10"  (1.778 m)     Head Circumference --      Peak Flow --      Pain Score 04/22/20 1328 0     Pain Loc --      Pain Edu? --      Excl. in [place]? --    Constitutional: Alert and oriented. Well appearing and in no acute distress. Eyes: Conjunctivae are normal. PERRL. Head: Atraumatic. Nose: No congestion/rhinnorhea. Mouth/Throat: Mucous membranes are moist. Neck: No stridor Cardiovascular: Grossly normal heart sounds.  Good peripheral circulation. Respiratory: Normal respiratory effort.  No retractions. Gastrointestinal: Soft and nontender. No distention. Musculoskeletal: No obvious deformities Neurologic:  Normal speech and language. No gross focal neurologic deficits are appreciated. Skin:  Skin is warm and dry. No rash noted. Psychiatric: Mood and affect are normal. Speech and behavior are normal.  ____________________________________________   LABS (all labs ordered are listed, but only abnormal results are displayed)  Labs Reviewed  BASIC METABOLIC PANEL - Abnormal; Notable for the following components:      Result Value   Sodium 134 (*)    Glucose, Bld 214 (*)    All other components within normal limits  URINALYSIS, COMPLETE (UACMP) WITH MICROSCOPIC - Abnormal; Notable for the following components:   Color, Urine STRAW (*)    APPearance CLEAR (*)    Glucose, UA >=500 (*)    All other components within normal limits  CBC  TROPONIN I (HIGH SENSITIVITY)  TROPONIN I (HIGH SENSITIVITY)   ____________________________________________  EKG  ED ECG REPORT I, Naaman Plummer, the attending physician, personally viewed and interpreted this ECG.  Date: 04/22/2020 EKG Time: 1321 Rate: 80 Rhythm: normal sinus rhythm QRS Axis: normal Intervals: normal ST/T Wave abnormalities: normal Narrative  Interpretation: no evidence of acute ischemia  ____________________________________________  RADIOLOGY  ED MD interpretation: 2 view chest x-ray shows no evidence of acute abnormalities including no pneumonia, pneumothorax, or widened mediastinum  Official radiology report(s): CT Renal Stone Study  Result Date: 04/22/2020 CLINICAL DATA:  Flank pain, kidney stone suspected. EXAM: CT ABDOMEN AND PELVIS WITHOUT CONTRAST TECHNIQUE: Multidetector CT imaging of the abdomen and pelvis was performed following the standard protocol without IV contrast. COMPARISON:  CT abdomen pelvis 01/28/2007 FINDINGS: Lower chest: No acute abnormality. Evaluation of  the abdominal viscera limited by the lack of IV contrast. Hepatobiliary: No focal liver abnormality is seen. Normal appearance of the gallbladder. Pancreas: Unremarkable. No surrounding inflammatory changes. Spleen: Normal in size without focal abnormality. Adrenals/Urinary Tract: Adrenal glands are unremarkable. Kidneys are symmetric in size. No hydronephrosis or renal calculi. No large mass lesion. Urinary bladder is unremarkable. The Stomach/Bowel: Stomach is within normal limits. Appendix appears normal. No evidence of bowel wall thickening, distention, or inflammatory changes. Vascular/Lymphatic: Aortic atherosclerosis. Vascular patency cannot be assessed in the absence of IV contrast. No enlarged abdominal or pelvic lymph nodes. Reproductive: Prostate is unremarkable. Other: No abdominal wall hernia or abnormality. No abdominopelvic ascites. Musculoskeletal: No acute or significant osseous findings. IMPRESSION: 1. No evidence of acute intra-abdominal pathology on a noncontrast exam. No evidence of renal calculi. 2. Aortic atherosclerosis. Aortic Atherosclerosis (ICD10-I70.0).  The Electronically Signed   By: Audie Pinto M.D.   On: 04/22/2020 19:43    ____________________________________________   PROCEDURES  Procedure(s) performed (including  Critical Care):  Procedures   ____________________________________________   INITIAL IMPRESSION / ASSESSMENT AND PLAN / ED COURSE  As part of my medical decision making, I reviewed the following data within the [place] notes reviewed and incorporated, Labs reviewed, EKG interpreted, Old chart reviewed, Radiograph reviewed and Notes from prior ED visits reviewed and incorporated        Patient presents for flank pain.  Differential diagnosis includes abdominal aortic aneurysm, surgical biliary disease, pancreatitis, SBO, mesenteric ischemia, serious intra-abdominal bacterial illness, genital torsion. Doubt atypical ACS. Based on history, physical exam, radiologic/laboratory evaluation, there is no red flag results or symptomatology requiring emergent intervention or need for admission at this time Pt tolerating PO. Disposition: Patient will be discharged with strict return precautions and follow up with primary MD within 12-24 hours for further evaluation. Patient understands that this still may have an early presentation of an emergent medical condition such as appendicitis that will require a recheck.      ____________________________________________   FINAL CLINICAL IMPRESSION(S) / ED DIAGNOSES  Final diagnoses:  Right flank pain     ED Discharge Orders    None       Note:  This document was prepared using Dragon voice recognition software and may include unintentional dictation errors.   Naaman Plummer, MD 04/23/20 [phone]

## 2020-04-22 NOTE — Telephone Encounter (Addendum)
Call received from answering service regarding patient report of sx similar to that before his 12/2019 stenting.  Reports progressive episodes of central chest and back pain that started out minor and have escalated to severe. He has not had pain like this since before his stent.  He does not on a blood pressure cuff at home and was unable to give any heart rate or blood pressure readings.  He reports taking 1 nitroglycerin with 2 recurrent episodes since that time.  Given his symptoms are identical to those before his stenting, recommendation was to proceed to the emergency department.

## 2020-04-22 NOTE — Telephone Encounter (Signed)
err

## 2020-04-27 IMAGING — MR MR SHOULDER*L* W/O CM
5 series · 32 of 40 positions shown · non-contrast
Comparison: MRI left shoulder dated March 29, 2013.

CLINICAL DATA: Chronic shoulder pain and limited range of motion.
No prior surgery.

EXAM:
MRI OF THE LEFT SHOULDER WITHOUT CONTRAST
TECHNIQUE: Multiplanar, multisequence MR imaging of the shoulder was performed.
No intravenous contrast was administered.

[Series 5: PD fat-sat · axial · left · 4.0mm · 0.55mm/px · z∈[-71,+59]mm · 8 of 28 slices shown (1 of 2)]
[im 1/28]
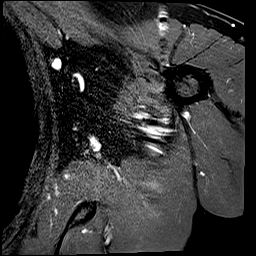
[im 4/28]
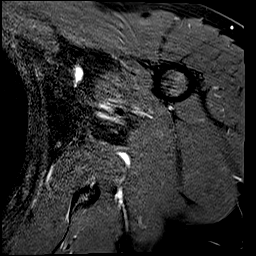
[im 10/28]
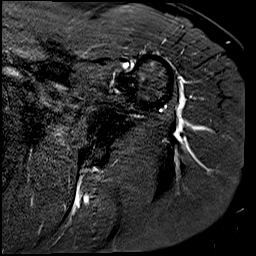
[im 13/28]
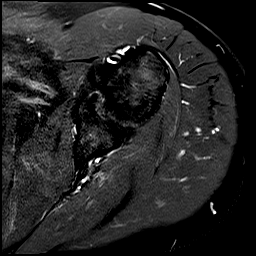
[im 16/28]
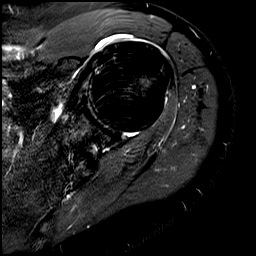
[im 19/28]
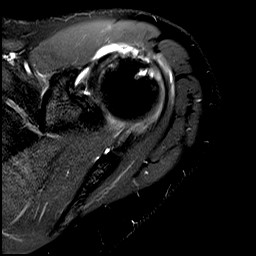
[im 25/28]
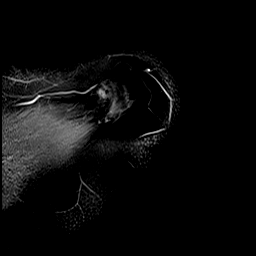
[im 28/28]
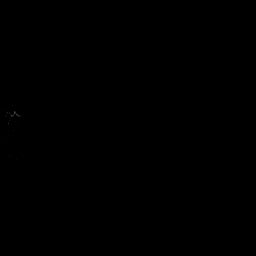

[Series 7: T2 fat-sat · oblique · left · 4.0mm · 0.44mm/px · 8 of 26 slices shown (1 of 2)]
[im 1/26]
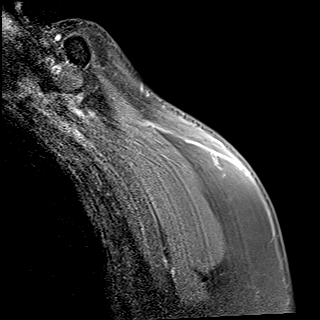
[im 4/26]
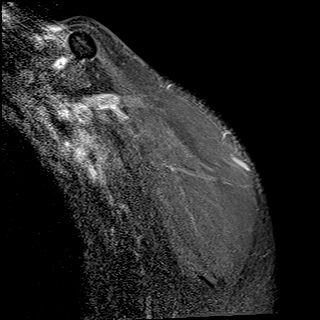
[im 8/26]
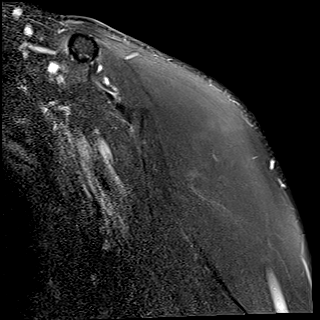
[im 11/26]
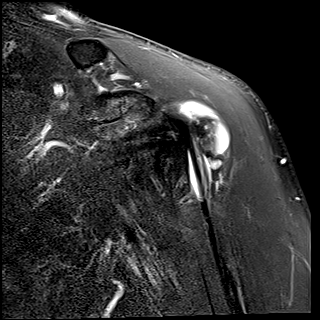
[im 15/26]
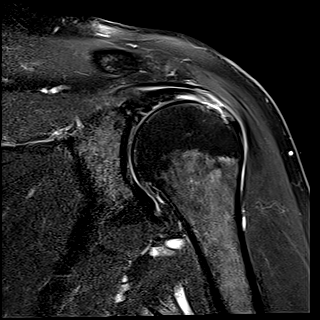
[im 18/26]
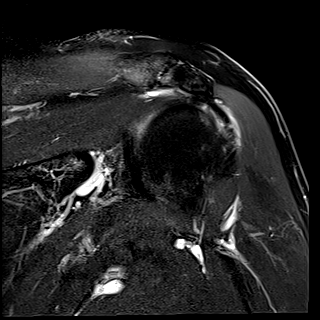
[im 22/26]
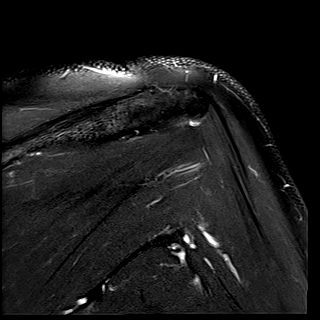
[im 26/26]
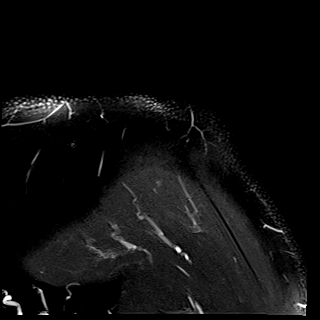

[Series 8: PD fat-sat · oblique · left · 4.0mm · 0.44mm/px · 8 of 26 slices shown (2 of 2)]
[im 1/26]
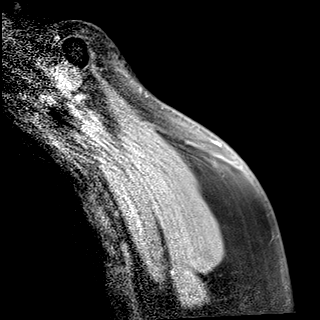
[im 4/26]
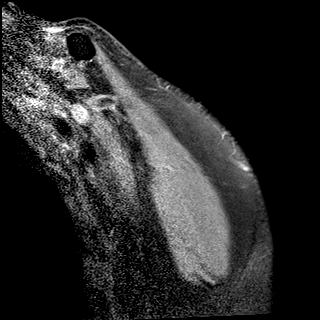
[im 8/26]
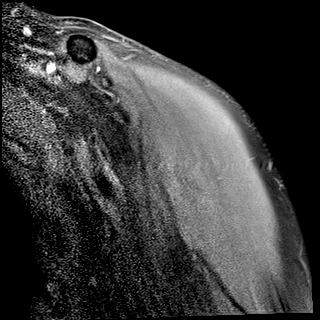
[im 11/26]
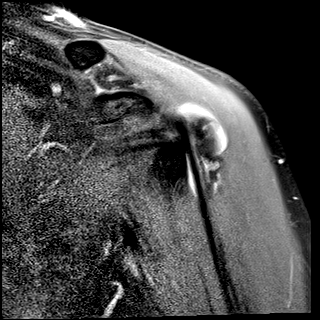
[im 15/26]
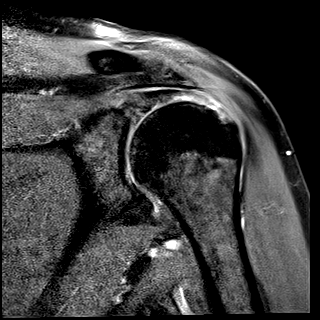
[im 18/26]
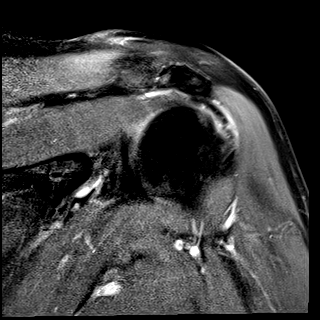
[im 22/26]
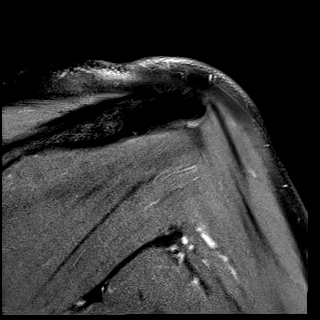
[im 26/26]
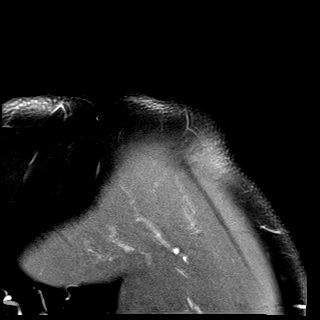

[Series 9: T2 fat-sat · oblique · left · 4.0mm · 0.23mm/px · 7 of 22 slices shown (2 of 2)]
[im 1/22]
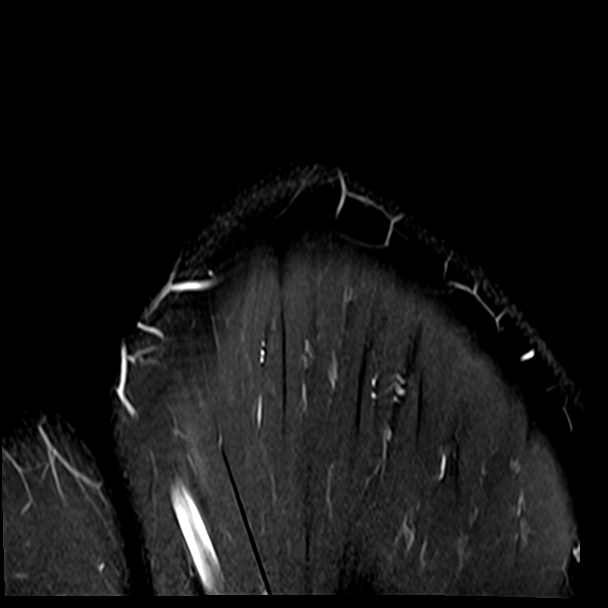
[im 4/22]
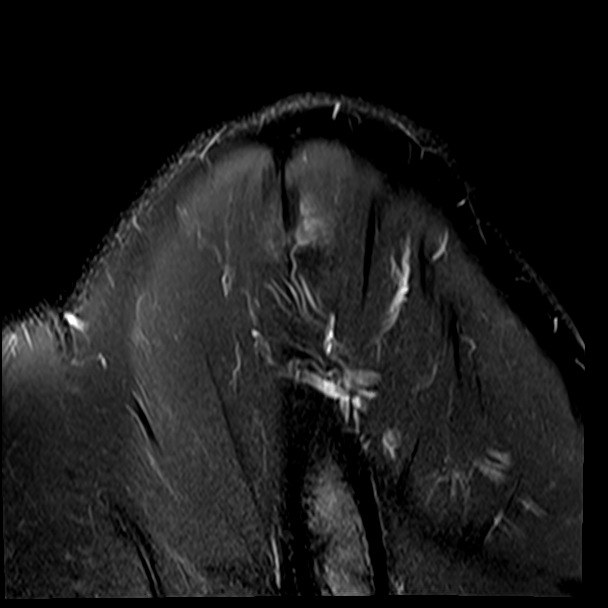
[im 8/22]
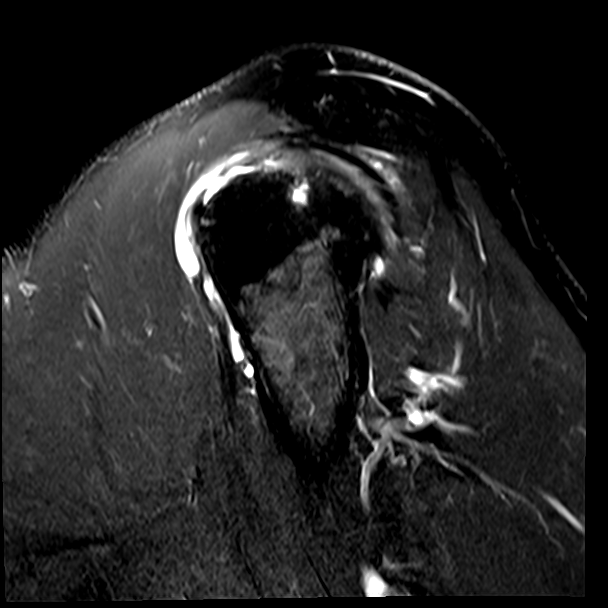
[im 11/22]
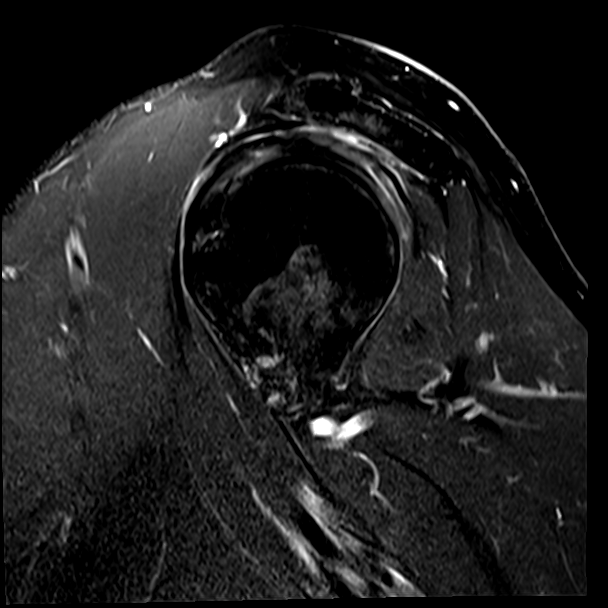
[im 15/22]
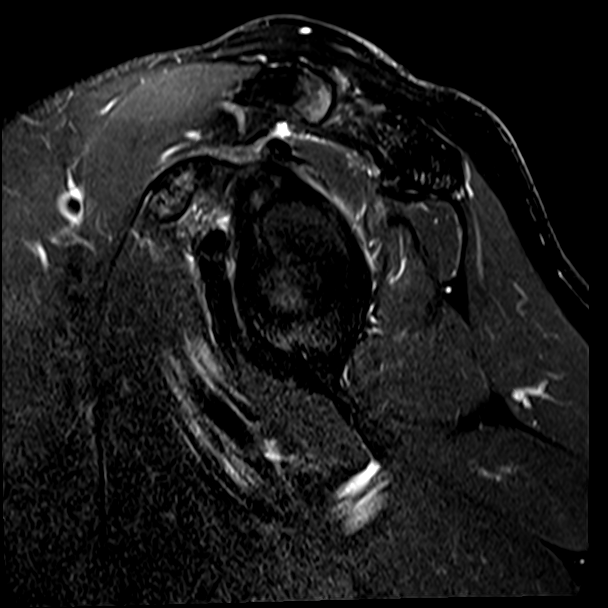
[im 18/22]
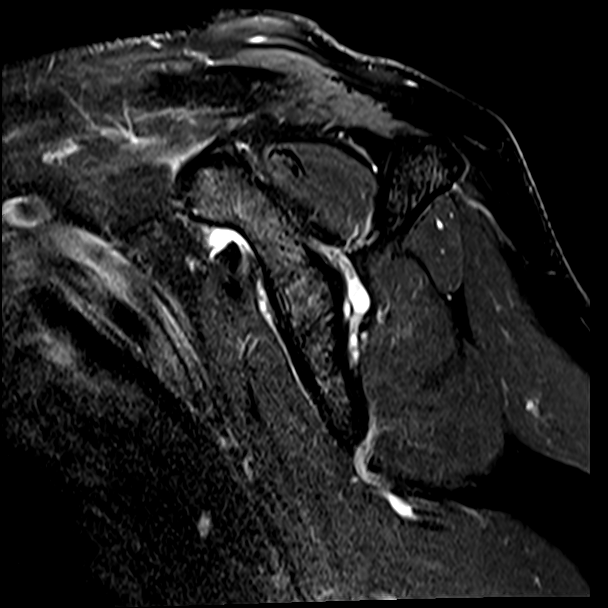
[im 22/22]
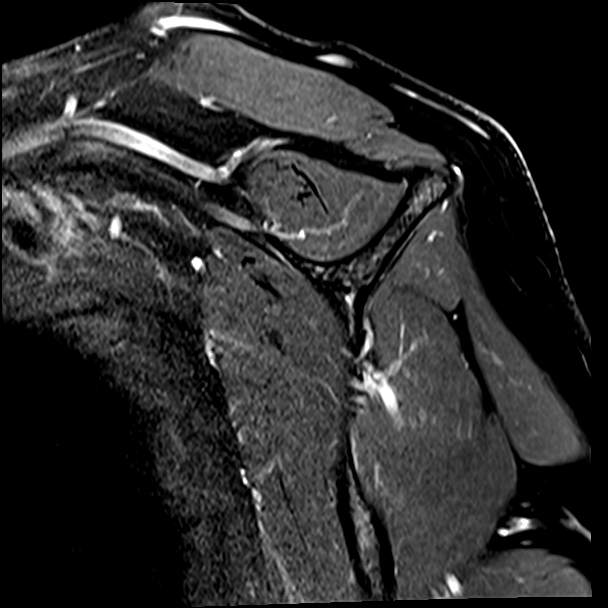

[Series 10: T1 · oblique · left · 4.0mm · 0.36mm/px · 1 of 22 slices shown]
[im 1/22]
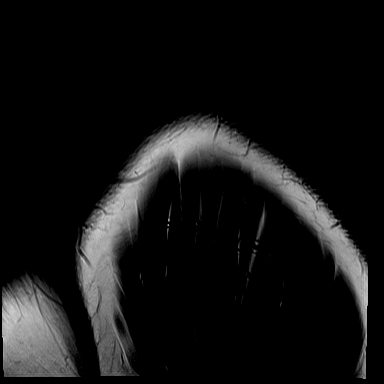

[32 of 40 positions shown; findings below may reference images not displayed]

FINDINGS: Rotator cuff: Progressive full-thickness, partial width tear of the
anterior supraspinatus tendon at the insertion, now measuring 1.6 cm
anterior-posterior with 8 mm retraction. New high-grade
partial-thickness, articular surface tear of the remaining posterior
supraspinatus tendon. Mild infraspinatus and subscapularis
tendinosis. The teres minor tendon is unremarkable.

Muscles: No atrophy or abnormal signal of the muscles of the rotator
cuff.

Biceps long head: Intact and normally positioned. Mild
intra-articular tendinosis.

Acromioclavicular Joint: Mild arthropathy of the acromioclavicular
joint. Type II acromion. Small subacromial/subdeltoid bursal fluid.

Glenohumeral Joint: No joint effusion. No chondral defect.

Labrum: New blunting and irregularity of the posterosuperior labrum.
Remaining labrum is grossly intact, although evaluation is limited
by lack of intra-articular fluid.

Bones:  No acute fracture or dislocation.  No focal bone lesion.

Other: None.
IMPRESSION: 1. Progressive full-thickness, partial width tear of the anterior
supraspinatus tendon at the insertion. New high-grade
partial-thickness, articular surface tear of the remaining posterior
supraspinatus tendon.
2. New blunting and irregularity of the posterosuperior labrum,
suspicious for small degenerative tear.
3. Mild acromioclavicular osteoarthritis.

## 2020-05-07 ENCOUNTER — Other Ambulatory Visit: Payer: Self-pay

## 2020-05-07 MED FILL — Metformin HCl Tab ER 24HR 500 MG: ORAL | 90 days supply | Qty: 180 | Fill #0 | Status: AC

## 2020-05-16 ENCOUNTER — Other Ambulatory Visit: Payer: Self-pay

## 2020-05-21 ENCOUNTER — Other Ambulatory Visit: Payer: Self-pay

## 2020-05-21 MED FILL — Losartan Potassium Tab 25 MG: ORAL | 90 days supply | Qty: 90 | Fill #0 | Status: AC

## 2020-06-06 ENCOUNTER — Other Ambulatory Visit: Payer: Self-pay

## 2020-06-06 ENCOUNTER — Ambulatory Visit
Admission: EM | Admit: 2020-06-06 | Discharge: 2020-06-06 | Disposition: A | Discharge status: Home / Self Care | Payer: No Typology Code available for payment source | Attending: Family Medicine | Admitting: Family Medicine

## 2020-06-06 DIAGNOSIS — M25562 Pain in left knee: Secondary | ICD-10-CM

## 2020-06-06 DIAGNOSIS — M1712 Unilateral primary osteoarthritis, left knee: Secondary | ICD-10-CM | POA: Insufficient documentation

## 2020-06-06 MED ORDER — KETOROLAC TROMETHAMINE 60 MG/2ML IM SOLN
30.0000 mg | Freq: Once | INTRAMUSCULAR | Status: AC
  Administered 2020-06-06: 30 mg via INTRAMUSCULAR

## 2020-06-06 MED ORDER — HYDROCODONE-ACETAMINOPHEN 5-325 MG PO TABS
1.0000 | ORAL_TABLET | Freq: Three times a day (TID) | ORAL | 0 refills | Status: DC | PRN
  Filled 2020-06-06: qty 10, 4d supply, fill #0

## 2020-06-06 NOTE — ED Triage Notes (Signed)
Patient states that he received a cortisone shot in his left knee around 930am. States that he came back from lunch around 1230pm and knee started to throb in the front of knee across knee cap.

## 2020-06-06 NOTE — Discharge Instructions (Signed)
Rest, ice, elevate.  Take it easy.  Call ortho if you worsen.  Take care  Dr. Lacinda Axon

## 2020-06-06 NOTE — ED Provider Notes (Signed)
[facility]    CSN: [id number] Arrival date & time: 06/06/20  1348      History   Chief Complaint Chief Complaint  Patient presents with  . Knee Pain    left   HPI  65 year old male presents with acute left knee pain.  Patient had a corticosteroid injection today by orthopedics.  Approximately 3 hours after his injection, he developed severe left knee pain.  His pain is severe, 10/10 in severity.  Described as throbbing.  Patient reports difficulty bearing weight.  Also reports diffuse tenderness to palpation.  No fall, trauma, injury.  No other complaints or concerns at this time.  Past Medical History:  Diagnosis Date  . Coronary artery disease    (a) DES to RCA 12/2019  . Diabetes ([place])    type 2 x 14 years  . ED (erectile dysfunction)   . GERD (gastroesophageal reflux disease)   . 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 HLD (hyperlipidemia)   . Hypertension   . Seasonal allergies     Patient Active Problem List   Diagnosis Date Noted  . Left knee pain 03/29/2020  . Primary insomnia 03/29/2020  . Encounter for medication titration 02/09/2020  . Unstable angina ([place])   . Chest discomfort 12/12/2019  . History of colonic polyps   . Routine adult health maintenance 07/01/2019  . ED (erectile dysfunction) of organic origin 02/21/2015  . Diabetes mellitus type 2 with complications, uncontrolled ([place]) 10/18/2014  . Hyperlipemia 10/18/2014    Past Surgical History:  Procedure Laterality Date  . ARTHOSCOPIC ROTAOR CUFF REPAIR Left 02/25/2018   Procedure: LEFT ARTHROSCOPIC ROTATOR CUFF REPAIR; SUBACROMIAL DECOMPRESSION;  Surgeon: Tania Ade, MD;  Location: [facility];  Service: Orthopedics;  Laterality: Left;  . CATARACT EXTRACTION W/PHACO Right 09/03/2016   Procedure: CATARACT EXTRACTION PHACO AND INTRAOCULAR LENS PLACEMENT ([place]) RIGHT DIABETIC;  Surgeon: Leandrew Koyanagi, MD;  Location: [place];  Service: Ophthalmology;   Laterality: Right;  DIABETIC  . COLONOSCOPY WITH PROPOFOL N/A 08/29/2019   Procedure: COLONOSCOPY WITH PROPOFOL;  Surgeon: Lin Landsman, MD;  Location: [facility] END[facility]COPY;  Service: Gastroenterology;  Laterality: N/A;  . CORONARY STENT INTERVENTION N/A 12/16/2019   Procedure: CORONARY STENT INTERVENTION;  Surgeon: Wellington Hampshire, MD;  Location: [place] [facility];  Service: Cardiovascular;  Laterality: N/A;  . LEFT HEART CATH AND CORONARY ANGIOGRAPHY N/A 12/16/2019   Procedure: LEFT HEART CATH AND CORONARY ANGIOGRAPHY;  Surgeon: Wellington Hampshire, MD;  Location: [place] [facility];  Service: Cardiovascular;  Laterality: N/A;  . ROTATOR CUFF REPAIR Right 2009       Home Medications    Prior to Admission medications   Medication Sig Start Date End Date Taking? Authorizing Provider  aspirin EC 81 MG tablet Take 1 tablet (81 mg total) by mouth daily. 02/09/18  Yes Wellington Hampshire, MD  Blood Glucose Monitoring Suppl (FREESTYLE LITE) DEVI  03/05/18  Yes [provider]  glucose blood test strip USE 1 STRIP DAILY Patient taking differently: USE 1 STRIP DAILY 08/09/19 08/08/20 Yes Masoud, Viann Shove, MD  HYDROcodone-acetaminophen (NORCO/VICODIN) 5-325 MG tablet Take 1 tablet by mouth every 8 (eight) hours as needed for moderate pain or severe pain. 06/06/20  Yes Bertin Inabinet G, DO  Insulin Glargine (BASAGLAR KWIKPEN) 100 UNIT/ML Inject 30 Units into the skin daily.   Yes [provider]  Insulin Pen Needle 31G X 5 MM MISC USE TO INJECT INSULIN DAILY 08/11/19 08/10/20 Yes Masoud,  Viann Shove, MD  losartan (COZAAR) 25 MG tablet TAKE 1 TABLET BY MOUTH DAILY. 02/09/20 02/08/21 Yes Wellington Hampshire, MD  metFORMIN (GLUCOPHAGE-XR) 500 MG 24 hr tablet TAKE 1 TABLET BY MOUTH TWICE DAILY 02/09/20 02/08/21 Yes Beckie Salts, FNP  metoprolol succinate (TOPROL-XL) 50 MG 24 hr tablet TAKE 1 TABLET BY MOUTH DAILY. TAKE WITH OR IMMEDIATELY FOLLOWING A MEAL. Patient taking differently: Take by mouth daily. TAKE WITH OR  IMMEDIATELY FOLLOWING A MEAL. 07/12/19 07/11/20 Yes Masoud, Viann Shove, MD  Multiple Vitamin (MULTIVITAMIN WITH MINERALS) TABS tablet Take 1 tablet by mouth at bedtime.   Yes [provider]  neomycin-polymyxin b-dexamethasone (MAXITROL) 3.5-10000-0.1 SUSP PLACE ONE DROP IN THE SURGICAL EYE 4 TIMES A DAY FOR 2 WEEKS THEN STOP 10/28/19 10/27/20 Yes Karle Starch, MD  nitroGLYCERIN (NITROSTAT) 0.4 MG SL tablet PLACE 1 TABLET (0.4 MG TOTAL) UNDER THE TONGUE EVERY 5 (FIVE) MINUTES AS NEEDED. 12/16/19 12/15/20 Yes Cheryln Manly, NP  NONFORMULARY OR COMPOUNDED ITEM Trimix (30/1/10)-(Pap/Phent/PGE) Dosage: Inject 1.0 cc per injection Prefilled syringe : 10 syringes Qty10 [place] [phone] Fax [phone] 07/08/18  Yes Zara Council A, PA-C  omeprazole (PRILOSEC) 20 MG capsule TAKE 1 CAPSULE BY MOUTH DAILY. 07/22/19 07/21/20 Yes Masoud, Viann Shove, MD  rosuvastatin (CRESTOR) 40 MG tablet TAKE 1 TABLET (40 MG TOTAL) BY MOUTH AT BEDTIME. 03/19/20 03/19/21 Yes Wellington Hampshire, MD  Semaglutide,0.25 or 0.5MG /DOS, 2 MG/1.5ML SOPN INJECT 0.5 MG INTO THE SKIN ONCE A WEEK 01/13/20 01/12/21 Yes Beckie Salts, FNP  sildenafil (VIAGRA) 100 MG tablet Take 1 tablet (100 mg total) by mouth daily as needed for erectile dysfunction. 08/09/18  Yes McGowan, Hunt Oris, PA-C  ticagrelor (BRILINTA) 90 MG TABS tablet TAKE 1 TABLET (90 MG TOTAL) BY MOUTH 2 (TWO) TIMES DAILY. 12/23/19 12/22/20 Yes Loel Dubonnet, NP  traZODone (DESYREL) 50 MG tablet TAKE 1 TABLET BY MOUTH AT BEDTIME 03/19/20 03/19/21 Yes Masoud, Viann Shove, MD  UNIFINE PENTIPS 29G X 12MM MISC Use to inject insulin daily 08/11/19  Yes Masoud, Viann Shove, MD  zolpidem (AMBIEN) 10 MG tablet Take 1 tablet (10 mg total) by mouth at bedtime as needed for sleep. 04/11/20 06/16/20 Yes Masoud, Viann Shove, MD  metFORMIN (GLUCOPHAGE-XR) 500 MG 24 hr tablet TAKE 1 TABLET BY MOUTH TWICE DAILY 04/25/19 04/24/20  Cletis Athens, MD    Family History Family History  Problem  Relation Age of Onset  . Heart attack Father   . Heart attack Maternal Uncle   . Heart attack Maternal Uncle   . Heart attack Maternal Uncle   . Heart attack Maternal Uncle   . Colon cancer Neg Hx   . Esophageal cancer Neg Hx   . Rectal cancer Neg Hx   . Stomach cancer Neg Hx   . Kidney cancer Neg Hx   . Kidney disease Neg Hx   . Prostate cancer Neg Hx   . Urolithiasis Neg Hx     Social History Social History   Tobacco Use  . Smoking status: Former Smoker    Quit date: 01/06/1982    Years since quitting: 38.4  . Smokeless tobacco: Never Used  Vaping Use  . Vaping Use: Never used  Substance Use Topics  . Alcohol use: Yes    Alcohol/week: 24.0 standard drinks    Types: 24 Cans of beer per week    Comment: 1 case on weekend  . Drug use: No     Allergies   Latex   Review of Systems Review of Systems  Per HPI  Physical Exam Triage Vital Signs ED Triage Vitals  Enc Vitals Group     BP 06/06/20 1416 (!) 141/67     Pulse Rate 06/06/20 1416 60     Resp 06/06/20 1416 (!) 26     Temp 06/06/20 1416 97.7 F (36.5 C)     Temp Source 06/06/20 1416 Oral     SpO2 06/06/20 1416 100 %     Weight 06/06/20 1412 198 lb (89.8 kg)     Height 06/06/20 1412 5\' 10"  (1.778 m)     Head Circumference --      Peak Flow --      Pain Score 06/06/20 1412 10     Pain Loc --      Pain Edu? --      Excl. in [place]? --    Updated Vital Signs BP (!) 141/67 (BP Location: Left Arm)   Pulse 60   Temp 97.7 F (36.5 C) (Oral)   Resp (!) 26   Ht 5\' 10"  (1.778 m)   Wt 89.8 kg   SpO2 100%   BMI 28.41 kg/m   Visual Acuity Right Eye Distance:   Left Eye Distance:   Bilateral Distance:    Right Eye Near:   Left Eye Near:    Bilateral Near:     Physical Exam Vitals and nursing note reviewed.  Constitutional:      Comments: Appears in distress secondary to pain.  HENT:     Head: Normocephalic and atraumatic.  Eyes:     General:        Right eye: No discharge.        Left eye: No  discharge.     Conjunctiva/sclera: Conjunctivae normal.  Pulmonary:     Effort: Pulmonary effort is normal. No respiratory distress.  Musculoskeletal:     Comments: Left knee -no erythema or warmth.  Diffuse tenderness to palpation.  Knee is exquisitely tender  Neurological:     Mental Status: He is alert.    UC Treatments / Results  Labs (all labs ordered are listed, but only abnormal results are displayed) Labs Reviewed - No data to display  EKG   Radiology No results found.  Procedures Procedures (including critical care time)  Medications Ordered in UC Medications  ketorolac (TORADOL) injection 30 mg (30 mg Intramuscular Given 06/06/20 1428)    Initial Impression / Assessment and Plan / UC Course  I have reviewed the triage vital signs and the nursing notes.  Pertinent labs & imaging results that were available during my care of the patient were reviewed by me and considered in my medical decision making (see chart for details).    65 year old male presents with acute left knee pain.  I believe that he is experiencing a flare from corticosteroid.  Toradol given in clinic today.  Advised rest, ice, elevation.  Vicodin as needed.  Spoke with orthopedic PA, Mia Creek who agrees that this is the likely presentation and was in agreement with my plan of care.  He advised follow-up with orthopedics if he fails improve or worsens.  Final Clinical Impressions(s) / UC Diagnoses   Final diagnoses:  Acute pain of left knee     Discharge Instructions     Rest, ice, elevate.  Take it easy.  Call ortho if you worsen.  Take care  Dr. Lacinda Axon   ED Prescriptions    Medication Sig Dispense Auth. Provider   HYDROcodone-acetaminophen (NORCO/VICODIN) 5-325 MG tablet Take 1 tablet by  mouth every 8 (eight) hours as needed for moderate pain or severe pain. 10 tablet Thersa Salt G, DO     I have reviewed the PDMP during this encounter.   Coral Spikes, [place] 06/06/20 1737

## 2020-06-14 ENCOUNTER — Other Ambulatory Visit: Payer: Self-pay

## 2020-06-14 ENCOUNTER — Other Ambulatory Visit: Payer: Self-pay | Admitting: Internal Medicine

## 2020-06-14 MED ORDER — ZOLPIDEM TARTRATE 10 MG PO TABS
10.0000 mg | ORAL_TABLET | Freq: Every evening | ORAL | 1 refills | Status: DC | PRN
  Filled 2020-06-14: qty 30, 30d supply, fill #0
  Filled 2020-07-10 – 2020-07-12 (×2): qty 30, 30d supply, fill #1

## 2020-06-14 MED ORDER — FREESTYLE FREEDOM LITE W/DEVICE KIT
PACK | 0 refills | Status: DC
  Filled 2020-06-14: qty 1, 1d supply, fill #0

## 2020-06-14 MED FILL — Semaglutide Soln Pen-inj 0.25 or 0.5 MG/DOSE (2 MG/1.5ML): SUBCUTANEOUS | 28 days supply | Qty: 1.5 | Fill #1 | Status: AC

## 2020-06-14 MED FILL — Rosuvastatin Calcium Tab 40 MG: ORAL | 90 days supply | Qty: 90 | Fill #0 | Status: AC

## 2020-06-29 ENCOUNTER — Ambulatory Visit: Payer: No Typology Code available for payment source | Admitting: Family Medicine

## 2020-07-05 ENCOUNTER — Other Ambulatory Visit: Payer: Self-pay

## 2020-07-05 ENCOUNTER — Ambulatory Visit (INDEPENDENT_AMBULATORY_CARE_PROVIDER_SITE_OTHER): Payer: No Typology Code available for payment source | Admitting: Family Medicine

## 2020-07-05 ENCOUNTER — Encounter: Payer: Self-pay | Admitting: Family Medicine

## 2020-07-05 VITALS — BP 140/76 | HR 72 | Ht 70.5 in | Wt 201.3 lb

## 2020-07-05 DIAGNOSIS — E118 Type 2 diabetes mellitus with unspecified complications: Secondary | ICD-10-CM

## 2020-07-05 DIAGNOSIS — E1165 Type 2 diabetes mellitus with hyperglycemia: Secondary | ICD-10-CM

## 2020-07-05 DIAGNOSIS — Z0000 Encounter for general adult medical examination without abnormal findings: Secondary | ICD-10-CM

## 2020-07-05 DIAGNOSIS — IMO0002 Reserved for concepts with insufficient information to code with codable children: Secondary | ICD-10-CM

## 2020-07-05 LAB — POCT GLYCOSYLATED HEMOGLOBIN (HGB A1C): HbA1c POC (<> result, manual entry): 6.8 % (ref 4.0–5.6)

## 2020-07-05 LAB — GLUCOSE, POCT (MANUAL RESULT ENTRY): POC Glucose: 266 mg/dl — AB (ref 70–99)

## 2020-07-05 NOTE — Assessment & Plan Note (Signed)
Diabetes mellitus Type II, under excellent control.. Discussed general issues about diabetes pathophysiology and management. A1C down 6.8 today from 7.5 3 months ago. He was at an 11 9 months ago. He is currently only taking Ozempic 1 x weekly and Metformin.

## 2020-07-05 NOTE — Progress Notes (Signed)
Established Patient Office Visit  SUBJECTIVE:  Subjective  Patient ID: Curtis Singh, male    DOB: Nov 07, 1955  Age: 65 y.o. MRN: [id number]  CC:  Chief Complaint  Patient presents with   Annual Exam    HPI Curtis Singh is a 65 y.o. 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    HLD (hyperlipidemia)    Hypertension    Seasonal allergies     Past Surgical History:  Procedure Laterality Date   ARTHOSCOPIC ROTAOR CUFF REPAIR Left 02/25/2018   Procedure: LEFT ARTHROSCOPIC ROTATOR CUFF REPAIR; SUBACROMIAL DECOMPRESSION;  Surgeon: Tania Ade, MD;  [facility];  Service: Orthopedics;  Laterality: Left;   CATARACT EXTRACTION W/PHACO Right 09/03/2016   Procedure: CATARACT EXTRACTION PHACO AND INTRAOCULAR LENS PLACEMENT ([place]) RIGHT DIABETIC;  Surgeon: Leandrew Koyanagi, MD;  Location: [place];  Service: Ophthalmology;  Laterality: Right;  DIABETIC   COLONOSCOPY WITH PROPOFOL N/A 08/29/2019   Procedure: COLONOSCOPY WITH PROPOFOL;  Surgeon: Lin Landsman, MD;  Location: [facility];  Service: Gastroenterology;  Laterality: N/A;   CORONARY STENT INTERVENTION N/A 12/16/2019   Procedure: CORONARY STENT INTERVENTION;  Surgeon: Wellington Hampshire, MD;  Location: [place] [facility];  Service: Cardiovascular;  Laterality: N/A;   LEFT HEART CATH AND CORONARY ANGIOGRAPHY N/A 12/16/2019   Procedure: LEFT HEART CATH AND CORONARY ANGIOGRAPHY;  Surgeon: Wellington Hampshire, MD;  Location: [place] [facility];  Service: Cardiovascular;  Laterality: N/A;   ROTATOR CUFF REPAIR Right 2009    Family History  Problem Relation Age of Onset   Heart attack Father    Heart attack Maternal Uncle    Heart attack Maternal Uncle    Heart attack  Maternal Uncle    Heart attack Maternal Uncle    Colon cancer Neg Hx    Esophageal cancer Neg Hx    Rectal cancer Neg Hx    Stomach cancer Neg Hx    Kidney cancer Neg Hx    Kidney disease Neg Hx    Prostate cancer Neg Hx    Urolithiasis Neg Hx     Social History   Socioeconomic History   Marital status: Divorced    Spouse name: Not on file   Number of children: Not on file   Years of education: Not on file   Highest education level: Not on file  Occupational History   Not on file  Tobacco Use   Smoking status: Former    Pack years: 0.00    Types: Cigarettes    Quit date: 01/06/1982    Years since quitting: 38.5   Smokeless tobacco: Never  Vaping Use   Vaping Use: Never used  Substance and Sexual Activity   Alcohol use: Yes    Alcohol/week: 24.0 standard drinks    Types: 24 Cans of beer per week    Comment: 1 case on weekend   Drug use: No   Sexual activity: Not on file  Other Topics Concern   Not on file  Social History Narrative   Not on file   Social Determinants of Health   Financial Resource Strain: Not on file  Food Insecurity: Not on file  Transportation Needs: Not on file  Physical Activity: Not on file  Stress: Not on file  Social Connections: Not on file  Intimate Partner Violence: Not on file     Current Outpatient Medications:    aspirin EC 81 MG tablet, Take 1 tablet (81 mg total) by mouth daily., Disp: 90 tablet, Rfl: 3   Blood Glucose Monitoring Suppl (FREESTYLE FREEDOM LITE) w/Device KIT, use as directed, Disp: 1 kit, Rfl: 0   Blood Glucose Monitoring Suppl (FREESTYLE LITE) DEVI, , Disp: , Rfl:    glucose blood test strip, USE 1 STRIP DAILY (Patient taking differently: USE 1 STRIP DAILY), Disp: 100 strip, Rfl: 12   HYDROcodone-acetaminophen (NORCO/VICODIN) 5-325 MG tablet, Take 1 tablet by mouth every 8 (eight) hours as needed for moderate pain or severe pain., Disp: 10 tablet, Rfl: 0   Insulin Glargine (BASAGLAR KWIKPEN) 100 UNIT/ML, Inject  30 Units into the skin daily., Disp: , Rfl:    Insulin Pen Needle 31G X 5 MM MISC, USE TO INJECT INSULIN DAILY, Disp: 100 each, Rfl: 1   losartan (COZAAR) 25 MG tablet, TAKE 1 TABLET BY MOUTH DAILY., Disp: 90 tablet, Rfl: 3   metFORMIN (GLUCOPHAGE-XR) 500 MG 24 hr tablet, TAKE 1 TABLET BY MOUTH TWICE DAILY, Disp: 180 tablet, Rfl: 3   metoprolol succinate (TOPROL-XL) 50 MG 24 hr tablet, TAKE 1 TABLET BY MOUTH DAILY. TAKE WITH OR IMMEDIATELY FOLLOWING A MEAL., Disp: 90 tablet, Rfl: 3   Multiple Vitamin (MULTIVITAMIN WITH MINERALS) TABS tablet, Take 1 tablet by mouth at bedtime., Disp: , Rfl:    neomycin-polymyxin b-dexamethasone (MAXITROL) 3.5-10000-0.1 SUSP, PLACE ONE DROP IN THE SURGICAL EYE 4 TIMES A DAY FOR 2 WEEKS THEN STOP, Disp: 5 mL, Rfl: 0   nitroGLYCERIN (NITROSTAT) 0.4 MG SL tablet, PLACE 1 TABLET (0.4 MG TOTAL) UNDER THE TONGUE EVERY 5 (FIVE) MINUTES AS NEEDED., Disp: 25 tablet, Rfl: 2   NONFORMULARY OR COMPOUNDED ITEM, Trimix (30/1/10)-(Pap/Phent/PGE) Dosage: Inject 1.0 cc per injection Prefilled syringe : 10 syringes Qty10 [place] [phone] Fax [phone], Disp: 10 each, Rfl: 6   omeprazole (PRILOSEC) 20 MG capsule, TAKE 1 CAPSULE BY MOUTH DAILY., Disp: 90 capsule, Rfl: 3   rosuvastatin (CRESTOR) 40 MG tablet, TAKE 1 TABLET (40 MG TOTAL) BY MOUTH AT BEDTIME., Disp: 90 tablet, Rfl: 3   Semaglutide,0.25 or 0.5MG/DOS, 2 MG/1.5ML SOPN, INJECT 0.5 MG INTO THE SKIN ONCE A WEEK, Disp: 1.5 mL, Rfl: 2   sildenafil (VIAGRA) 100 MG tablet, Take 1 tablet (100 mg total) by mouth daily as needed for erectile dysfunction., Disp: 30 tablet, Rfl: 6   ticagrelor (BRILINTA) 90 MG TABS tablet, TAKE 1 TABLET (90 MG TOTAL) BY MOUTH 2 (TWO) TIMES DAILY., Disp: 180 tablet, Rfl: 3   traZODone (DESYREL) 50 MG tablet, TAKE 1 TABLET BY MOUTH AT BEDTIME, Disp: 30 tablet, Rfl: 1   UNIFINE PENTIPS 29G X 12MM MISC, Use to inject insulin daily, Disp: 30 each, Rfl: 6   zolpidem (AMBIEN) 10 MG  tablet, Take 1 tablet (10 mg total) by mouth at bedtime as needed for sleep., Disp: 30 tablet, Rfl: 1   metFORMIN (GLUCOPHAGE-XR) 500 MG 24 hr tablet, TAKE 1 TABLET BY MOUTH TWICE DAILY, Disp: 90 tablet, Rfl: 5   Allergies  Allergen Reactions   Latex Rash    ROS Review of Systems  Constitutional: Negative.   HENT: Negative.    Respiratory: Negative.    Cardiovascular: Negative.   Genitourinary: Negative.   Psychiatric/Behavioral: Negative.  OBJECTIVE:    Physical Exam Constitutional:      Appearance: He is obese.  HENT:     Right Ear: Tympanic membrane normal.     Left Ear: Tympanic membrane normal.  Cardiovascular:     Rate and Rhythm: Normal rate.  Musculoskeletal:        General: Normal range of motion.     Cervical back: Normal range of motion.  Psychiatric:        Mood and Affect: Mood normal.    BP 140/76   Pulse 72   Ht 5' 10.5" (1.791 m)   Wt 201 lb 4.8 oz (91.3 kg)   BMI 28.48 kg/m  Wt Readings from Last 3 Encounters:  07/05/20 201 lb 4.8 oz (91.3 kg)  06/06/20 198 lb (89.8 kg)  04/22/20 195 lb (88.5 kg)    Health Maintenance Due  Topic Date Due   PNEUMOCOCCAL POLYSACCHARIDE VACCINE AGE 49-64 HIGH RISK  Never done   COVID-19 Vaccine (1) Never done   Pneumococcal Vaccine 32-18 Years old (1 - PCV) Never done   FOOT EXAM  Never done   HIV Screening  Never done   Hepatitis C Screening  Never done   TETANUS/TDAP  Never done   Zoster Vaccines- Shingrix (1 of 2) Never done    There are no preventive care reminders to display for this patient.  CBC Latest Ref Rng & Units 04/22/2020 12/15/2019 07/04/2019  WBC 4.0 - 10.5 K/uL 8.6 6.7 6.3  Hemoglobin 13.0 - 17.0 g/dL 15.2 14.8 15.0  Hematocrit 39.0 - 52.0 % 44.4 43.7 44.6  Platelets 150 - 400 K/uL 291 269 233   CMP Latest Ref Rng & Units 04/22/2020 12/15/2019 07/04/2019  Glucose 70 - 99 mg/dL 214(H) 223(H) 289(H)  BUN 8 - 23 mg/dL 13 15 24(H)  Creatinine 0.61 - 1.24 mg/dL 0.93 0.79 0.95  Sodium 135 -  145 mmol/L 134(L) 138 136  Potassium 3.5 - 5.1 mmol/L 4.2 4.0 4.8  Chloride 98 - 111 mmol/L 101 105 101  CO2 22 - 32 mmol/L '24 25 26  ' Calcium 8.9 - 10.3 mg/dL 9.2 9.7 8.9  Total Protein 6.5 - 8.1 g/dL - - -  Total Bilirubin 0.3 - 1.2 mg/dL - - -  Alkaline Phos 38 - 126 U/L - - -  AST 15 - 41 U/L - - -  ALT 0 - 44 U/L - - -    Lab Results  Component Value Date   TSH 1.311 07/04/2019   Lab Results  Component Value Date   ALBUMIN 4.4 03/02/2019   ANIONGAP 9 04/22/2020   Lab Results  Component Value Date   CHOL 182 07/04/2019   CHOL 158 03/02/2019   CHOL 241 (H) 06/18/2017   HDL 69 07/04/2019   HDL 56 03/02/2019   HDL 53 06/18/2017   LDLCALC 66 07/04/2019   LDLCALC 69 03/02/2019   LDLCALC 158 (H) 06/18/2017   CHOLHDL 2.6 07/04/2019   CHOLHDL 2.8 03/02/2019   CHOLHDL 4.5 06/18/2017   Lab Results  Component Value Date   TRIG 233 (H) 07/04/2019   Lab Results  Component Value Date   HGBA1C 6.8 07/05/2020   HGBA1C 7.5 03/29/2020   HGBA1C 10.4 (H) 12/23/2019      ASSESSMENT & PLAN:   Problem List Items Addressed This Visit       Endocrine   Diabetes mellitus type 2 with complications, uncontrolled ([place]) - Primary    Diabetes mellitus Type II, under excellent control.. Discussed general  issues about diabetes pathophysiology and management. A1C down 6.8 today from 7.5 3 months ago. He was at an 11 9 months ago. He is currently only taking Ozempic 1 x weekly and Metformin.         Relevant Orders   POCT glucose (manual entry) (Completed)   POCT glycosylated hemoglobin (Hb A1C) (Completed)    No orders of the defined types were placed in this encounter.     Follow-up: No follow-ups on file.    Beckie Salts, [place] [address] [place], [place] [address]

## 2020-07-10 ENCOUNTER — Other Ambulatory Visit: Payer: Self-pay | Admitting: Internal Medicine

## 2020-07-10 ENCOUNTER — Other Ambulatory Visit: Payer: Self-pay

## 2020-07-10 MED FILL — Metoprolol Succinate Tab ER 24HR 50 MG (Tartrate Equiv): ORAL | 90 days supply | Qty: 90 | Fill #0 | Status: AC

## 2020-07-10 MED FILL — Omeprazole Cap Delayed Release 20 MG: ORAL | 90 days supply | Qty: 90 | Fill #0 | Status: AC

## 2020-07-12 ENCOUNTER — Other Ambulatory Visit: Payer: Self-pay

## 2020-07-16 ENCOUNTER — Other Ambulatory Visit (HOSPITAL_COMMUNITY): Payer: Self-pay

## 2020-07-23 ENCOUNTER — Other Ambulatory Visit: Payer: Self-pay

## 2020-07-23 ENCOUNTER — Other Ambulatory Visit: Payer: Self-pay | Admitting: Family Medicine

## 2020-07-23 MED ORDER — OZEMPIC (0.25 OR 0.5 MG/DOSE) 2 MG/1.5ML ~~LOC~~ SOPN
0.5000 mg | PEN_INJECTOR | SUBCUTANEOUS | 2 refills | Status: DC
  Filled 2020-07-23: qty 1.5, 28d supply, fill #0
  Filled 2020-08-28 – 2020-08-29 (×2): qty 3, 56d supply, fill #1

## 2020-08-07 ENCOUNTER — Other Ambulatory Visit: Payer: Self-pay

## 2020-08-07 MED FILL — Metformin HCl Tab ER 24HR 500 MG: ORAL | 90 days supply | Qty: 180 | Fill #1 | Status: AC

## 2020-08-14 ENCOUNTER — Other Ambulatory Visit: Payer: Self-pay

## 2020-08-14 ENCOUNTER — Other Ambulatory Visit: Payer: Self-pay | Admitting: Internal Medicine

## 2020-08-14 ENCOUNTER — Ambulatory Visit: Payer: No Typology Code available for payment source | Admitting: Cardiovascular Disease

## 2020-08-14 ENCOUNTER — Encounter: Payer: Self-pay | Admitting: Cardiovascular Disease

## 2020-08-14 VITALS — BP 128/70 | HR 62 | Ht 70.5 in | Wt 199.0 lb

## 2020-08-14 DIAGNOSIS — E785 Hyperlipidemia, unspecified: Secondary | ICD-10-CM

## 2020-08-14 DIAGNOSIS — I2510 Atherosclerotic heart disease of native coronary artery without angina pectoris: Secondary | ICD-10-CM

## 2020-08-14 DIAGNOSIS — I10 Essential (primary) hypertension: Secondary | ICD-10-CM

## 2020-08-14 MED ORDER — ZOLPIDEM TARTRATE 10 MG PO TABS
ORAL_TABLET | ORAL | 1 refills | Status: DC
  Filled 2020-08-14: qty 30, 30d supply, fill #0
  Filled 2020-09-13: qty 30, 30d supply, fill #1

## 2020-08-14 MED ORDER — ZOLPIDEM TARTRATE 10 MG PO TABS: 10.0000 mg | ORAL_TABLET | Freq: Every evening | ORAL | 1 refills | Status: DC | PRN

## 2020-08-14 NOTE — Progress Notes (Signed)
Cardiology Office Note   Date:  08/14/2020   ID:  Curtis Singh, DOB 12/29/1955, MRN [id number]  PCP:  P.A., Cletis Athens, MD  Cardiologist:   [name], MD  Chief Complaint  Patient presents with   Follow-up    6 Months follow up and medications verbally reviewed with patient.        History of Present Illness: Curtis Singh is a 65 y.o. male who is here today for follow-up visit regarding coronary artery disease.   He has known history of diabetes mellitus on insulin, hyperlipidemia, essential hypertension, aortic atherosclerosis and strong family history of coronary artery disease. He is not a smoker. He was seen in December of 2021 with unstable angina.   Cardiac catheterization showed 99% stenosis in the proximal/mid RCA and no other obstructive disease.  Ejection fraction and LVEDP were both normal.  I performed successful angioplasty and drug-eluting stent placement to the right coronary artery.   He has been doing well with no recent chest pain or worsening dyspnea.  He continues to have occasional shortness of breath especially in the morning when he wakes up.  This is thought to be due to [place].  He does complain of easy bruising.  He also has chronic left knee pain and might require surgery in the near future.  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    HLD (hyperlipidemia)    Hypertension    Seasonal allergies     Past Surgical History:  Procedure Laterality Date   ARTHOSCOPIC ROTAOR CUFF REPAIR Left 02/25/2018   Procedure: LEFT ARTHROSCOPIC ROTATOR CUFF REPAIR; SUBACROMIAL DECOMPRESSION;  Surgeon: Tania Ade, MD;  Location: [facility];  Service: Orthopedics;  Laterality: Left;   CATARACT EXTRACTION W/PHACO Right 09/03/2016   Procedure: CATARACT  EXTRACTION PHACO AND INTRAOCULAR LENS PLACEMENT ([place]) RIGHT DIABETIC;  Surgeon: Leandrew Koyanagi, MD;  Location: [facility];  Service: Ophthalmology;  Laterality: Right;  DIABETIC   COLONOSCOPY WITH PROPOFOL N/A 08/29/2019   Procedure: COLONOSCOPY WITH PROPOFOL;  Surgeon: Lin Landsman, MD;  [facility]: [facility]COPY;  Service: Gastroenterology;  Laterality: N/A;   CORONARY STENT INTERVENTION N/A 12/16/2019   Procedure: CORONARY STENT INTERVENTION;  Surgeon: Wellington Hampshire, MD;  Location: [place] [facility];  Service: Cardiovascular;  Laterality: N/A;   LEFT HEART CATH AND CORONARY ANGIOGRAPHY N/A 12/16/2019   Procedure: LEFT HEART CATH AND CORONARY ANGIOGRAPHY;  Surgeon: Wellington Hampshire, MD;  Location: [place] [facility];  Service: Cardiovascular;  Laterality: N/A;   ROTATOR CUFF REPAIR Right 2009     Current Outpatient Medications  Medication Sig Dispense Refill   aspirin EC 81 MG tablet Take 1 tablet (81 mg total) by mouth daily. 90 tablet 3   Blood Glucose Monitoring Suppl (FREESTYLE FREEDOM LITE) w/Device KIT use as directed 1 kit 0   Blood Glucose Monitoring Suppl (FREESTYLE LITE) DEVI      dexlansoprazole (DEXILANT) 60 MG capsule Take by mouth.     losartan (COZAAR) 25 MG tablet TAKE 1 TABLET BY MOUTH DAILY. 90 tablet 3   metFORMIN (GLUCOPHAGE-XR) 500 MG 24 hr tablet TAKE 1 TABLET BY MOUTH TWICE DAILY 180 tablet 3   metoprolol succinate (TOPROL-XL) 50 MG 24  hr tablet TAKE 1 TABLET BY MOUTH DAILY. TAKE WITH OR IMMEDIATELY FOLLOWING A MEAL. 90 tablet 3   Multiple Vitamin (MULTIVITAMIN WITH MINERALS) TABS tablet Take 1 tablet by mouth at bedtime.     nitroGLYCERIN (NITROSTAT) 0.4 MG SL tablet PLACE 1 TABLET (0.4 MG TOTAL) UNDER THE TONGUE EVERY 5 (FIVE) MINUTES AS NEEDED. 25 tablet 2   NONFORMULARY OR COMPOUNDED ITEM Trimix (30/1/10)-(Pap/Phent/PGE) Dosage: Inject 1.0 cc per injection Prefilled syringe : 10 syringes Qty10 [place] [phone] Fax [phone] 10 each 6   omeprazole (PRILOSEC) 20 MG capsule TAKE 1 CAPSULE BY MOUTH DAILY. 90 capsule 3   rosuvastatin (CRESTOR) 40 MG tablet TAKE 1 TABLET (40 MG TOTAL) BY MOUTH AT BEDTIME. 90 tablet 3   Semaglutide,0.25 or 0.5MG/DOS, (OZEMPIC, 0.25 OR 0.5 MG/DOSE,) 2 MG/1.5ML SOPN INJECT 0.5 MG INTO THE SKIN ONCE A WEEK 1.5 mL 2   sildenafil (VIAGRA) 100 MG tablet Take 1 tablet (100 mg total) by mouth daily as needed for erectile dysfunction. 30 tablet 6   ticagrelor (BRILINTA) 90 MG TABS tablet TAKE 1 TABLET (90 MG TOTAL) BY MOUTH 2 (TWO) TIMES DAILY. 180 tablet 3   UNIFINE PENTIPS 29G X 12MM MISC Use to inject insulin daily 30 each 6   zolpidem (AMBIEN) 10 MG tablet Take 1 tablet (10 mg total) by mouth at bedtime as needed for sleep. 30 tablet 1   Insulin Glargine (BASAGLAR KWIKPEN) 100 UNIT/ML Inject 30 Units into the skin daily. (Patient not taking: Reported on 08/14/2020)     zolpidem (AMBIEN) 10 MG tablet take 1 tablet by mouth at bedtime as needed for sleep 30 tablet 1   No current facility-administered medications for this visit.    Allergies:   Latex    Social History:  The patient  reports that he quit smoking about 38 years ago. His smoking use included cigarettes. He has never used smokeless tobacco. He reports current alcohol use of about 24.0 standard drinks of alcohol per week. He reports that he does not use drugs.   Family History:  The patient's family history includes Heart attack in his father, maternal uncle, maternal uncle, maternal uncle, and maternal uncle.    ROS:  Please see the history of present illness.   Otherwise, review of systems are positive for none.   All other systems are reviewed and negative.    PHYSICAL EXAM: VS:  BP 128/70 (BP Location: Left Arm, Patient Position: Sitting, Cuff Size: Normal)   Pulse 62   Ht 5' 10.5" (1.791 m)   Wt 199 lb (90.3 kg)   SpO2 97%   BMI 28.15 kg/m  , BMI Body mass index is 28.15  kg/m. GEN: Well nourished, well developed, in no acute distress  HEENT: normal  Neck: no JVD, carotid bruits, or masses Cardiac: RRR; no  rubs, or gallops,no edema .  1/ 6 systolic murmur in the aortic area Respiratory:  clear to auscultation bilaterally, normal work of breathing GI: soft, nontender, nondistended, + BS MS: no deformity or atrophy  Skin: warm and dry, no rash Neuro:  Strength and sensation are intact Psych: euthymic mood, full affect Radial pulse is normal.  EKG:  EKG  ordered today. EKG showed normal sinus rhythm with no significant ST or T wave changes.   Recent Labs: 04/22/2020: BUN 13; Creatinine, Ser 0.93; Hemoglobin 15.2; Platelets 291; Potassium 4.2; Sodium 134    Lipid Panel    Component Value Date/Time   CHOL 182 07/04/2019 0823  CHOL 239 (H) 01/20/2014 0729   TRIG 233 (H) 07/04/2019 0823   TRIG 171 01/20/2014 0729   HDL 69 07/04/2019 0823   HDL 48 01/20/2014 0729   CHOLHDL 2.6 07/04/2019 0823   VLDL 47 (H) 07/04/2019 0823   VLDL 34 01/20/2014 0729   LDLCALC 66 07/04/2019 0823   LDLCALC 157 (H) 01/20/2014 0729      Wt Readings from Last 3 Encounters:  08/14/20 199 lb (90.3 kg)  07/05/20 201 lb 4.8 oz (91.3 kg)  06/06/20 198 lb (89.8 kg)        ASSESSMENT AND PLAN:  1.  Coronary artery disease involving native coronary arteries without angina: He is doing very well at the present time with no anginal symptoms.  The plan is to continue dual antiplatelet therapy for 1 year.  Will discontinue Brilinta in December.    2.  Hyperlipidemia: Continue rosuvastatin 40 mg daily.  Most recent lipid profile showed an LDL of 66.  He will require a follow-up lipid and liver profile this year.  3.  Essential hypertension: Blood pressure is well controlled on current medications.  4.  Diabetes mellitus: Significant improvement in hemoglobin A1c to 6.8 from 11.   Disposition:   FU with me in 4 months  Signed, [name], MD 08/14/2020 4:24 PM     [place]

## 2020-08-14 NOTE — Patient Instructions (Signed)
Medication Instructions:  Your physician recommends that you continue on your current medications as directed. Please refer to the Current Medication list given to you today.  *If you need a refill on your cardiac medications before your next appointment, please call your pharmacy*   Lab Work: None ordered  If you have labs (blood work) drawn today and your tests are completely normal, you will receive your results only by: MyChart Message (if you have MyChart) OR A paper copy in the mail If you have any lab test that is abnormal or we need to change your treatment, we will call you to review the results.   Testing/Procedures: None ordered   Follow-Up: At [facility], you and your health needs are our priority.  As part of our continuing mission to provide you with exceptional heart care, we have created designated Provider Care Teams.  These Care Teams include your primary Cardiologist (physician) and Advanced Practice Providers (APPs -  Physician Assistants and Nurse Practitioners) who all work together to provide you with the care you need, when you need it.  We recommend signing up for the patient portal called "MyChart".  Sign up information is provided on this After Visit Summary.  MyChart is used to connect with patients for Virtual Visits (Telemedicine).  Patients are able to view lab/test results, encounter notes, upcoming appointments, etc.  Non-urgent messages can be sent to your provider as well.   To learn more about what you can do with MyChart, go to [email or web address].    Your next appointment:   4 month(s)  The format for your next appointment:   In Person  Provider:   You may see Muhammad Arida, MD or one of the following Advanced Practice Providers on your designated Care Team:   Christopher Berge, NP Ryan Dunn, PA-C Jacquelyn Visser, PA-C Cadence Furth, PA-C   Other Instructions N/A  

## 2020-08-22 ENCOUNTER — Other Ambulatory Visit (HOSPITAL_COMMUNITY): Payer: Self-pay | Admitting: Surgery

## 2020-08-22 ENCOUNTER — Other Ambulatory Visit: Payer: Self-pay | Admitting: Surgery

## 2020-08-22 DIAGNOSIS — M1712 Unilateral primary osteoarthritis, left knee: Secondary | ICD-10-CM

## 2020-08-22 DIAGNOSIS — S83232S Complex tear of medial meniscus, current injury, left knee, sequela: Secondary | ICD-10-CM

## 2020-08-22 DIAGNOSIS — S83232A Complex tear of medial meniscus, current injury, left knee, initial encounter: Secondary | ICD-10-CM | POA: Insufficient documentation

## 2020-08-24 ENCOUNTER — Other Ambulatory Visit: Payer: Self-pay

## 2020-08-24 MED ORDER — LORAZEPAM 1 MG PO TABS
ORAL_TABLET | ORAL | 0 refills | Status: DC
  Filled 2020-08-24: qty 2, 1d supply, fill #0

## 2020-08-28 MED FILL — Losartan Potassium Tab 25 MG: ORAL | 90 days supply | Qty: 90 | Fill #1 | Status: AC

## 2020-08-29 ENCOUNTER — Other Ambulatory Visit: Payer: Self-pay

## 2020-09-04 ENCOUNTER — Ambulatory Visit
Admission: RE | Admit: 2020-09-04 | Discharge: 2020-09-04 | Disposition: A | Discharge status: Home / Self Care | Payer: No Typology Code available for payment source | Source: Ambulatory Visit | Attending: Surgery | Admitting: Surgery

## 2020-09-04 ENCOUNTER — Other Ambulatory Visit: Payer: Self-pay

## 2020-09-04 DIAGNOSIS — M1712 Unilateral primary osteoarthritis, left knee: Secondary | ICD-10-CM | POA: Insufficient documentation

## 2020-09-04 DIAGNOSIS — S83232S Complex tear of medial meniscus, current injury, left knee, sequela: Secondary | ICD-10-CM | POA: Insufficient documentation

## 2020-09-13 ENCOUNTER — Other Ambulatory Visit: Payer: Self-pay

## 2020-09-13 MED FILL — Rosuvastatin Calcium Tab 40 MG: ORAL | 90 days supply | Qty: 90 | Fill #1 | Status: AC

## 2020-09-24 ENCOUNTER — Other Ambulatory Visit: Payer: Self-pay | Admitting: Urology

## 2020-09-24 ENCOUNTER — Other Ambulatory Visit: Payer: Self-pay

## 2020-09-24 MED FILL — Omeprazole Cap Delayed Release 20 MG: ORAL | 90 days supply | Qty: 90 | Fill #1 | Status: AC

## 2020-10-02 ENCOUNTER — Encounter: Payer: Self-pay | Admitting: Internal Medicine

## 2020-10-02 ENCOUNTER — Ambulatory Visit (INDEPENDENT_AMBULATORY_CARE_PROVIDER_SITE_OTHER): Payer: No Typology Code available for payment source | Admitting: Internal Medicine

## 2020-10-02 ENCOUNTER — Other Ambulatory Visit: Payer: Self-pay

## 2020-10-02 VITALS — BP 155/85 | HR 82 | Ht 70.5 in | Wt 202.1 lb

## 2020-10-02 DIAGNOSIS — IMO0002 Reserved for concepts with insufficient information to code with codable children: Secondary | ICD-10-CM

## 2020-10-02 DIAGNOSIS — F5101 Primary insomnia: Secondary | ICD-10-CM | POA: Diagnosis not present

## 2020-10-02 DIAGNOSIS — N529 Male erectile dysfunction, unspecified: Secondary | ICD-10-CM

## 2020-10-02 DIAGNOSIS — E1165 Type 2 diabetes mellitus with hyperglycemia: Secondary | ICD-10-CM | POA: Diagnosis not present

## 2020-10-02 DIAGNOSIS — E118 Type 2 diabetes mellitus with unspecified complications: Secondary | ICD-10-CM

## 2020-10-02 DIAGNOSIS — E782 Mixed hyperlipidemia: Secondary | ICD-10-CM | POA: Diagnosis not present

## 2020-10-02 LAB — POCT GLYCOSYLATED HEMOGLOBIN (HGB A1C): HbA1c POC (<> result, manual entry): 9.1 % (ref 4.0–5.6)

## 2020-10-02 LAB — GLUCOSE, POCT (MANUAL RESULT ENTRY): POC Glucose: 241 mg/dl — AB (ref 70–99)

## 2020-10-02 MED ORDER — ZOLPIDEM TARTRATE 10 MG PO TABS
10.0000 mg | ORAL_TABLET | Freq: Every evening | ORAL | 1 refills | Status: DC | PRN
Start: 2020-10-02 — End: 2020-11-26

## 2020-10-02 NOTE — Progress Notes (Signed)
Established Patient Office Visit  Subjective:  Patient ID: Curtis Singh, male    DOB: [birth year]  Age: 65 y.o. MRN: [id number]  CC: No chief complaint on file.   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    HLD (hyperlipidemia)    Hypertension    Seasonal allergies     Past Surgical History:  Procedure Laterality Date   ARTHOSCOPIC ROTAOR CUFF REPAIR Left 02/25/2018   Procedure: LEFT ARTHROSCOPIC ROTATOR CUFF REPAIR; SUBACROMIAL DECOMPRESSION;  Surgeon: Tania Ade, MD;  [facility];  Service: Orthopedics;  Laterality: Left;   CATARACT EXTRACTION W/PHACO Right 09/03/2016   Procedure: CATARACT EXTRACTION PHACO AND INTRAOCULAR LENS PLACEMENT ([place]) RIGHT DIABETIC;  Surgeon: Leandrew Koyanagi, MD;  Location: [place];  Service: Ophthalmology;  Laterality: Right;  DIABETIC   COLONOSCOPY WITH PROPOFOL N/A 08/29/2019   Procedure: COLONOSCOPY WITH PROPOFOL;  Surgeon: Lin Landsman, MD;  Location: [facility] END[facility]COPY;  Service: Gastroenterology;  Laterality: N/A;   CORONARY STENT INTERVENTION N/A 12/16/2019   Procedure: CORONARY STENT INTERVENTION;  Surgeon: Wellington Hampshire, MD;  Location: [place] [facility];  Service: Cardiovascular;  Laterality: N/A;   LEFT HEART CATH AND CORONARY ANGIOGRAPHY N/A 12/16/2019   Procedure: LEFT HEART CATH AND CORONARY ANGIOGRAPHY;  Surgeon: Wellington Hampshire, MD;  Location: [place] [facility];  Service: Cardiovascular;  Laterality: N/A;   ROTATOR CUFF REPAIR Right 2009    Family History  Problem Relation Age of Onset   Heart attack Father    Heart attack Maternal Uncle    Heart attack Maternal Uncle    Heart attack Maternal Uncle    Heart attack Maternal Uncle     Colon cancer Neg Hx    Esophageal cancer Neg Hx    Rectal cancer Neg Hx    Stomach cancer Neg Hx    Kidney cancer Neg Hx    Kidney disease Neg Hx    Prostate cancer Neg Hx    Urolithiasis Neg Hx     Social History   Socioeconomic History   Marital status: Divorced    Spouse name: Not on file   Number of children: Not on file   Years of education: Not on file   Highest education level: Not on file  Occupational History   Not on file  Tobacco Use   Smoking status: Former    Types: Cigarettes    Quit date: 01/06/1982    Years since quitting: 38.7   Smokeless tobacco: Never  Vaping Use   Vaping Use: Never used  Substance and Sexual Activity   Alcohol use: Yes    Alcohol/week: 24.0 standard drinks    Types: 24 Cans of beer per week    Comment: 1 case on weekend   Drug use: No   Sexual activity: Not on file  Other Topics Concern   Not on file  Social History Narrative   Not on file   Social Determinants of Health   Financial Resource Strain: Not on file  Food Insecurity: Not on file  Transportation Needs: Not on file  Physical Activity: Not on file  Stress: Not on file  Social Connections:  Not on file  Intimate Partner Violence: Not on file     Current Outpatient Medications:    aspirin EC 81 MG tablet, Take 1 tablet (81 mg total) by mouth daily., Disp: 90 tablet, Rfl: 3   Blood Glucose Monitoring Suppl (FREESTYLE FREEDOM LITE) w/Device KIT, use as directed, Disp: 1 kit, Rfl: 0   Blood Glucose Monitoring Suppl (FREESTYLE LITE) DEVI, , Disp: , Rfl:    dexlansoprazole (DEXILANT) 60 MG capsule, Take by mouth., Disp: , Rfl:    Insulin Glargine (BASAGLAR KWIKPEN) 100 UNIT/ML, Inject 30 Units into the skin daily. (Patient not taking: Reported on 08/14/2020), Disp: , Rfl:    LORazepam (ATIVAN) 1 MG tablet, take one tablet by mouth 4 hours prior to schedules MRI procedure. May repeat 1/2 hour prior to procedure, Disp: 2 tablet, Rfl: 0   losartan (COZAAR) 25 MG tablet, TAKE 1  TABLET BY MOUTH DAILY., Disp: 90 tablet, Rfl: 3   metFORMIN (GLUCOPHAGE-XR) 500 MG 24 hr tablet, TAKE 1 TABLET BY MOUTH TWICE DAILY, Disp: 180 tablet, Rfl: 3   metoprolol succinate (TOPROL-XL) 50 MG 24 hr tablet, TAKE 1 TABLET BY MOUTH DAILY. TAKE WITH OR IMMEDIATELY FOLLOWING A MEAL., Disp: 90 tablet, Rfl: 3   Multiple Vitamin (MULTIVITAMIN WITH MINERALS) TABS tablet, Take 1 tablet by mouth at bedtime., Disp: , Rfl:    nitroGLYCERIN (NITROSTAT) 0.4 MG SL tablet, PLACE 1 TABLET (0.4 MG TOTAL) UNDER THE TONGUE EVERY 5 (FIVE) MINUTES AS NEEDED., Disp: 25 tablet, Rfl: 2   NONFORMULARY OR COMPOUNDED ITEM, Trimix (30/1/10)-(Pap/Phent/PGE) Dosage: Inject 1.0 cc per injection Prefilled syringe : 10 syringes Qty10 [place] [phone] Fax [phone], Disp: 10 each, Rfl: 6   omeprazole (PRILOSEC) 20 MG capsule, TAKE 1 CAPSULE BY MOUTH DAILY., Disp: 90 capsule, Rfl: 3   rosuvastatin (CRESTOR) 40 MG tablet, TAKE 1 TABLET (40 MG TOTAL) BY MOUTH AT BEDTIME., Disp: 90 tablet, Rfl: 3   Semaglutide,0.25 or 0.5MG/DOS, (OZEMPIC, 0.25 OR 0.5 MG/DOSE,) 2 MG/1.5ML SOPN, INJECT 0.5 MG INTO THE SKIN ONCE A WEEK, Disp: 1.5 mL, Rfl: 2   sildenafil (VIAGRA) 100 MG tablet, Take 1 tablet (100 mg total) by mouth daily as needed for erectile dysfunction., Disp: 30 tablet, Rfl: 6   ticagrelor (BRILINTA) 90 MG TABS tablet, TAKE 1 TABLET (90 MG TOTAL) BY MOUTH 2 (TWO) TIMES DAILY., Disp: 180 tablet, Rfl: 3   UNIFINE PENTIPS 29G X 12MM MISC, Use to inject insulin daily, Disp: 30 each, Rfl: 6   zolpidem (AMBIEN) 10 MG tablet, take 1 tablet by mouth at bedtime as needed for sleep, Disp: 30 tablet, Rfl: 1   zolpidem (AMBIEN) 10 MG tablet, Take 1 tablet (10 mg total) by mouth at bedtime as needed for sleep., Disp: 30 tablet, Rfl: 1   Allergies  Allergen Reactions   Latex Rash    ROS Review of Systems  Constitutional: Negative.   HENT: Negative.    Eyes: Negative.   Respiratory: Negative.     Cardiovascular: Negative.   Gastrointestinal: Negative.   Endocrine: Negative.   Genitourinary: Negative.   Musculoskeletal: Negative.   Skin: Negative.   Allergic/Immunologic: Negative.   Neurological: Negative.   Hematological: Negative.   Psychiatric/Behavioral: Negative.    All other systems reviewed and are negative.    Objective:    Physical Exam Vitals reviewed.  Constitutional:      Appearance: Normal appearance.  HENT:     Mouth/Throat:     Mouth: Mucous membranes are moist.  Eyes:  Pupils: Pupils are equal, round, and reactive to light.  Neck:     Vascular: No carotid bruit.  Cardiovascular:     Rate and Rhythm: Normal rate and regular rhythm.     Pulses: Normal pulses.     Heart sounds: Normal heart sounds.  Pulmonary:     Effort: Pulmonary effort is normal.     Breath sounds: Normal breath sounds.  Abdominal:     General: Bowel sounds are normal.     Palpations: Abdomen is soft. There is no hepatomegaly, splenomegaly or mass.     Tenderness: There is no abdominal tenderness.     Hernia: No hernia is present.  Musculoskeletal:     Cervical back: Neck supple.     Right lower leg: No edema.     Left lower leg: No edema.  Skin:    Findings: No rash.  Neurological:     Mental Status: He is alert and oriented to person, place, and time.     Motor: No weakness.  Psychiatric:        Mood and Affect: Mood normal.        Behavior: Behavior normal.    BP (!) 155/85   Pulse 82   Ht 5' 10.5" (1.791 m)   Wt 202 lb 1.6 oz (91.7 kg)   BMI 28.59 kg/m  Wt Readings from Last 3 Encounters:  10/02/20 202 lb 1.6 oz (91.7 kg)  08/14/20 199 lb (90.3 kg)  07/05/20 201 lb 4.8 oz (91.3 kg)     Health Maintenance Due  Topic Date Due   COVID-19 Vaccine (1) Never done   FOOT EXAM  Never done   HIV Screening  Never done   Hepatitis C Screening  Never done   TETANUS/TDAP  Never done   Zoster Vaccines- Shingrix (1 of 2) Never done   INFLUENZA VACCINE   08/06/2020   OPHTHALMOLOGY EXAM  08/29/2020    There are no preventive care reminders to display for this patient.  Lab Results  Component Value Date   TSH 1.311 07/04/2019   Lab Results  Component Value Date   WBC 8.6 04/22/2020   HGB 15.2 04/22/2020   HCT 44.4 04/22/2020   MCV 91.0 04/22/2020   PLT 291 04/22/2020   Lab Results  Component Value Date   NA 134 (L) 04/22/2020   K 4.2 04/22/2020   CO2 24 04/22/2020   GLUCOSE 214 (H) 04/22/2020   BUN 13 04/22/2020   CREATININE 0.93 04/22/2020   BILITOT 0.8 03/02/2019   ALKPHOS 60 03/02/2019   AST 19 03/02/2019   ALT 30 03/02/2019   PROT 7.1 03/02/2019   ALBUMIN 4.4 03/02/2019   CALCIUM 9.2 04/22/2020   ANIONGAP 9 04/22/2020   Lab Results  Component Value Date   CHOL 182 07/04/2019   Lab Results  Component Value Date   HDL 69 07/04/2019   Lab Results  Component Value Date   LDLCALC 66 07/04/2019   Lab Results  Component Value Date   TRIG 233 (H) 07/04/2019   Lab Results  Component Value Date   CHOLHDL 2.6 07/04/2019   Lab Results  Component Value Date   HGBA1C 9.1 10/02/2020      Assessment & Plan:   Problem List Items Addressed This Visit       Endocrine   Diabetes mellitus type 2 with complications, uncontrolled ([place]) - Primary    - I encouraged the patient to lose weight.  - I educated them on making healthy  dietary choices including eating more fruits and vegetables and less fried foods. - I encouraged the patient to exercise more, and educated on the benefits of exercise including weight loss, diabetes prevention, and hypertension prevention.   Dietary counseling with a registered dietician  Referral to a weight management support group (e.g. Weight Watchers, Overeaters Anonymous)  If your BMI is greater than 29 or you have gained more than 15 pounds you should work on weight loss.  Attend a healthy cooking class  Patient hemoglobin AIC routine blood sugar is high.  He was told to follow  his diet and stop drinking beer At the present time he is drinking about 6 beers a day.      Relevant Orders   POCT glucose (manual entry) (Completed)   POCT HgB A1C (Completed)     Other   Hyperlipemia    Hypercholesterolemia  I advised the patient to follow Mediterranean diet This diet is rich in fruits vegetables and whole grain, and This diet is also rich in fish and lean meat Patient should also eat a handful of almonds or walnuts daily Recent heart study indicated that average follow-up on this kind of diet reduces the cardiovascular mortality by 50 to 70%==      ED (erectile dysfunction) of organic origin    No complaints so far      Primary insomnia    I advised the patient to cut down on drinking beer.  And slowly wean off from Ambien.       Meds ordered this encounter  Medications   zolpidem (AMBIEN) 10 MG tablet    Sig: Take 1 tablet (10 mg total) by mouth at bedtime as needed for sleep.    Dispense:  30 tablet    Refill:  1    Follow-up: No follow-ups on file.    Cletis Athens, MD

## 2020-10-02 NOTE — Assessment & Plan Note (Signed)
No complaints so far

## 2020-10-02 NOTE — Assessment & Plan Note (Signed)
Hypercholesterolemia  I advised the patient to follow Mediterranean diet This diet is rich in fruits vegetables and whole grain, and This diet is also rich in fish and lean meat Patient should also eat a handful of almonds or walnuts daily Recent heart study indicated that average follow-up on this kind of diet reduces the cardiovascular mortality by 50 to 70%== 

## 2020-10-02 NOTE — Assessment & Plan Note (Signed)
I advised the patient to cut down on drinking beer.  And slowly wean off from Ambien.

## 2020-10-02 NOTE — Assessment & Plan Note (Signed)
-   I encouraged the patient to lose weight.  - I educated them on making healthy dietary choices including eating more fruits and vegetables and less fried foods. - I encouraged the patient to exercise more, and educated on the benefits of exercise including weight loss, diabetes prevention, and hypertension prevention.   Dietary counseling with a registered dietician  Referral to a weight management support group (e.g. Weight Watchers, Overeaters Anonymous)  If your BMI is greater than 29 or you have gained more than 15 pounds you should work on weight loss.  Attend a healthy cooking class  Patient hemoglobin AIC routine blood sugar is high.  He was told to follow his diet and stop drinking beer At the present time he is drinking about 6 beers a day.

## 2020-10-04 ENCOUNTER — Other Ambulatory Visit: Payer: Self-pay

## 2020-10-04 MED FILL — Ticagrelor Tab 90 MG: ORAL | 90 days supply | Qty: 180 | Fill #0 | Status: AC

## 2020-10-08 ENCOUNTER — Other Ambulatory Visit: Payer: Self-pay

## 2020-10-08 MED FILL — Metoprolol Succinate Tab ER 24HR 50 MG (Tartrate Equiv): ORAL | 90 days supply | Qty: 90 | Fill #1 | Status: AC

## 2020-10-21 NOTE — Progress Notes (Signed)
10/22/2020 4:07 PM   Curtis Singh 06-Nov-1955 [id number]  Referring provider: Malachi Bonds, MD [address] [place],  [place] [address]  Chief Complaint  Patient presents with   Follow-up   Medication Refill    Urological history: 1. ED -contributing factors of age, BPH, diabetes, HTN, HLD, former smoker, CAD and alcohol consumption -SHIM 15  2. BPH with LU TS -PSA pending -I PSS 24/3   HPI: Curtis Singh is a 65 y.o. male who presents today for follow up.  Patient is not having spontaneous erections.  He denies any pain or curvature with erections.     SHIM     Row Name 10/22/20 1544         SHIM: Over the last 6 months:   How do you rate your confidence that you could get and keep an erection? Low     When you had erections with sexual stimulation, how often were your erections hard enough for penetration (entering your partner)? A Few Times (much less than half the time)     During sexual intercourse, how often were you able to maintain your erection after you had penetrated (entered) your partner? Sometimes (about half the time)     During sexual intercourse, how difficult was it to maintain your erection to completion of intercourse? Slightly Difficult     When you attempted sexual intercourse, how often was it satisfactory for you? Most Times (much more than half the time)           SHIM Total Score   SHIM 15               Score: 1-7 Severe ED 8-11 Moderate ED 12-16 Mild-Moderate ED 17-21 Mild ED 22-25 No ED  He is having nocturia x 2 associated with a weak urinary stream, intermittency, straining and urgency.  Patient denies any modifying or aggravating factors.  Patient denies any gross hematuria, dysuria or suprapubic/flank pain.  Patient denies any fevers, chills, nausea or vomiting.     IPSS     Row Name 10/22/20 1500         International Prostate Symptom Score   How often have you had the sensation of not emptying your  bladder? Less than half the time     How often have you had to urinate less than every two hours? Less than half the time     How often have you found you stopped and started again several times when you urinated? Almost always     How often have you found it difficult to postpone urination? More than half the time     How often have you had a weak urinary stream? More than half the time     How often have you had to strain to start urination? Almost always     How many times did you typically get up at night to urinate? 2 Times     Total IPSS Score 24           Quality of Life due to urinary symptoms   If you were to spend the rest of your life with your urinary condition just the way it is now how would you feel about that? 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    HLD (hyperlipidemia)    Hypertension    Seasonal allergies     Surgical History: Past Surgical History:  Procedure Laterality Date   ARTHOSCOPIC ROTAOR CUFF REPAIR Left 02/25/2018   Procedure: LEFT ARTHROSCOPIC ROTATOR CUFF REPAIR; SUBACROMIAL DECOMPRESSION;  Surgeon: Tania Ade, MD;  [facility]: [facility];  Service: Orthopedics;  Laterality: Left;   CATARACT EXTRACTION W/PHACO Right 09/03/2016   Procedure: CATARACT EXTRACTION PHACO AND INTRAOCULAR LENS PLACEMENT ([place]) RIGHT DIABETIC;  Surgeon: Leandrew Koyanagi, MD;  Location: [facility];  Service: Ophthalmology;  Laterality: Right;  DIABETIC   COLONOSCOPY WITH PROPOFOL N/A 08/29/2019   Procedure: COLONOSCOPY WITH PROPOFOL;  Surgeon: Lin Landsman, MD;  Location: [facility];  Service: Gastroenterology;  Laterality: N/A;   CORONARY STENT INTERVENTION N/A  12/16/2019   Procedure: CORONARY STENT INTERVENTION;  Surgeon: Wellington Hampshire, MD;  Location: [facility];  Service: Cardiovascular;  Laterality: N/A;   LEFT HEART CATH AND CORONARY ANGIOGRAPHY N/A 12/16/2019   Procedure: LEFT HEART CATH AND CORONARY ANGIOGRAPHY;  Surgeon: Wellington Hampshire, MD;  Location: [facility];  Service: Cardiovascular;  Laterality: N/A;   ROTATOR CUFF REPAIR Right 2009    Home Medications:  Allergies as of 10/22/2020       Reactions   Latex Rash        Medication List        Accurate as of October 22, 2020  4:07 PM. If you have any questions, ask your nurse or doctor.          STOP taking these medications    Basaglar KwikPen 100 UNIT/ML Stopped by: SHANNON MCGOWAN, PA-C   dexlansoprazole 60 MG capsule Commonly known as: DEXILANT Stopped by: SHANNON MCGOWAN, PA-C   LORazepam 1 MG tablet Commonly known as: ATIVAN Stopped by: SHANNON MCGOWAN, PA-C   nitroGLYCERIN 0.4 MG SL tablet Commonly known as: NITROSTAT Stopped by: Zara Council, PA-C       TAKE these medications    aspirin EC 81 MG tablet Take 1 tablet (81 mg total) by mouth daily.   Brilinta 90 MG Tabs tablet Generic drug: ticagrelor TAKE 1 TABLET (90 MG TOTAL) BY MOUTH 2 (TWO) TIMES DAILY.   FreeStyle Freedom Lite w/Device Kit use as directed What changed: Another medication with the same name was removed. Continue taking this medication, and follow the directions you see here. Changed by: SHANNON MCGOWAN, PA-C   losartan 25 MG tablet Commonly known as: COZAAR TAKE 1 TABLET BY MOUTH DAILY.   metFORMIN 500 MG 24 hr tablet Commonly known as: GLUCOPHAGE-XR TAKE 1 TABLET BY MOUTH TWICE DAILY   metoprolol succinate 50 MG 24 hr tablet Commonly known as: TOPROL-XL TAKE 1 TABLET BY MOUTH DAILY. TAKE WITH OR IMMEDIATELY FOLLOWING A MEAL.   multivitamin with minerals Tabs tablet Take 1 tablet by mouth at bedtime.   NONFORMULARY OR COMPOUNDED  ITEM Trimix (30/1/10)-(Pap/Phent/PGE) Dosage: Inject 1.0 cc per injection Prefilled syringe : 10 syringes Qty[employer] [place] [phone] Fax [phone]   omeprazole 20 MG capsule Commonly known as: PRILOSEC TAKE 1 CAPSULE BY MOUTH DAILY.   Ozempic (0.25 or 0.5 MG/DOSE) 2 MG/1.5ML Sopn Generic drug: Semaglutide(0.25 or 0.5MG/DOS) INJECT 0.5 MG INTO THE SKIN ONCE A WEEK   rosuvastatin 40  MG tablet Commonly known as: CRESTOR TAKE 1 TABLET (40 MG TOTAL) BY MOUTH AT BEDTIME.   sildenafil 100 MG tablet Commonly known as: VIAGRA Take 1 tablet (100 mg total) by mouth daily as needed for erectile dysfunction.   Unifine Pentips 29G X 12MM Misc Generic drug: Insulin Pen Needle Use to inject insulin daily   zolpidem 10 MG tablet Commonly known as: AMBIEN Take 1 tablet (10 mg total) by mouth at bedtime as needed for sleep. What changed: Another medication with the same name was removed. Continue taking this medication, and follow the directions you see here. Changed by: Zara Council, PA-C        Allergies:  Allergies  Allergen Reactions   Latex Rash    Family History: Family History  Problem Relation Age of Onset   Heart attack Father    Heart attack Maternal Uncle    Heart attack Maternal Uncle    Heart attack Maternal Uncle    Heart attack Maternal Uncle    Colon cancer Neg Hx    Esophageal cancer Neg Hx    Rectal cancer Neg Hx    Stomach cancer Neg Hx    Kidney cancer Neg Hx    Kidney disease Neg Hx    Prostate cancer Neg Hx    Urolithiasis Neg Hx     Social History:  reports that he quit smoking about 38 years ago. His smoking use included cigarettes. He has never used smokeless tobacco. He reports current alcohol use of about 24.0 standard drinks per week. He reports that he does not use drugs.  ROS For pertinent review of systems please refer to history of present illness   Physical Exam: BP (!) 158/82   Pulse 79   Ht 5' 10.5"  (1.791 m)   Wt 199 lb (90.3 kg)   BMI 28.15 kg/m   Constitutional:  Well nourished. Alert and oriented, No acute distress. HEENT: [place] AT, mask in place.  Trachea midline Cardiovascular: No clubbing, cyanosis, or edema. Respiratory: Normal respiratory effort, no increased work of breathing. GU: No CVA tenderness.  No bladder fullness or masses.  Patient with circumcised phallus. Urethral meatus is patent.  No penile discharge. No penile lesions or rashes. Scrotum without lesions, cysts, rashes and/or edema.  Testicles are located scrotally bilaterally. No masses are appreciated in the testicles. Left and right epididymis are normal. Rectal: Patient with  normal sphincter tone. Anus and perineum without scarring or rashes. No rectal masses are appreciated. Prostate is approximately 45 grams, no nodules are appreciated. Seminal vesicles could not be palpated.  Neurologic: Grossly intact, no focal deficits, moving all 4 extremities. Psychiatric: Normal mood and affect.   Laboratory Data: Results for orders placed or performed in visit on 10/02/20  POCT glucose (manual entry)  Result Value Ref Range   POC Glucose 241 (A) 70 - 99 mg/dl  POCT HgB A1C  Result Value Ref Range   HbA1c POC (<> result, manual entry) 9.1 4.0 - 5.6 %  I have reviewed the labs  Assessment & Plan:    1. Erectile dysfunction -discussed taking a PDE5i daily, but will need cardiac approval   2. BPH with LUTS -PSA pending -DRE benign -symptoms - nocturia  -continue conservative management, avoiding bladder irritants and timed voiding's -discussed taking tadalafil 5 mg daily if approved by cardiac services as it is also indicated for treatment of BPH  Return for follow up pending labs .  These notes generated with voice recognition software. I  apologize for typographical errors.  Zara Council, PA-C  [facility] [address] [place], [place] [address] [phone]

## 2020-10-22 ENCOUNTER — Other Ambulatory Visit
Admission: RE | Admit: 2020-10-22 | Discharge: 2020-10-22 | Disposition: A | Discharge status: Home / Self Care | Payer: No Typology Code available for payment source | Attending: Urology | Admitting: Urology

## 2020-10-22 ENCOUNTER — Other Ambulatory Visit: Payer: Self-pay

## 2020-10-22 ENCOUNTER — Other Ambulatory Visit: Payer: Self-pay | Admitting: *Deleted

## 2020-10-22 ENCOUNTER — Ambulatory Visit: Payer: No Typology Code available for payment source | Admitting: Urology

## 2020-10-22 ENCOUNTER — Encounter: Payer: Self-pay | Admitting: Urology

## 2020-10-22 VITALS — BP 158/82 | HR 79 | Ht 70.5 in | Wt 199.0 lb

## 2020-10-22 DIAGNOSIS — N529 Male erectile dysfunction, unspecified: Secondary | ICD-10-CM

## 2020-10-22 DIAGNOSIS — N138 Other obstructive and reflux uropathy: Secondary | ICD-10-CM | POA: Diagnosis not present

## 2020-10-22 DIAGNOSIS — N401 Enlarged prostate with lower urinary tract symptoms: Secondary | ICD-10-CM | POA: Diagnosis not present

## 2020-10-22 LAB — PSA: Prostatic Specific Antigen: 0.25 ng/mL (ref 0.00–4.00)

## 2020-10-22 MED ORDER — SILDENAFIL CITRATE 100 MG PO TABS
100.0000 mg | ORAL_TABLET | Freq: Every day | ORAL | 0 refills | Status: AC | PRN
  Filled 2020-10-22: qty 6, 30d supply, fill #0
  Filled 2021-04-08: qty 6, 30d supply, fill #1
  Filled 2021-06-10: qty 6, 30d supply, fill #2
  Filled 2021-08-14: qty 6, 30d supply, fill #3

## 2020-10-23 ENCOUNTER — Other Ambulatory Visit: Payer: Self-pay | Admitting: *Deleted

## 2020-10-23 ENCOUNTER — Other Ambulatory Visit: Payer: Self-pay

## 2020-10-23 DIAGNOSIS — N401 Enlarged prostate with lower urinary tract symptoms: Secondary | ICD-10-CM

## 2020-10-23 DIAGNOSIS — N138 Other obstructive and reflux uropathy: Secondary | ICD-10-CM

## 2020-10-23 LAB — TESTOSTERONE: Testosterone: 197 ng/dL — ABNORMAL LOW (ref 264–916)

## 2020-10-23 MED ORDER — ZOLPIDEM TARTRATE 10 MG PO TABS
ORAL_TABLET | ORAL | 1 refills | Status: DC
  Filled 2020-10-23: qty 30, 30d supply, fill #0
  Filled 2020-11-22: qty 30, 30d supply, fill #1

## 2020-10-24 ENCOUNTER — Other Ambulatory Visit
Admission: RE | Admit: 2020-10-24 | Discharge: 2020-10-24 | Disposition: A | Discharge status: Home / Self Care | Payer: No Typology Code available for payment source | Attending: Urology | Admitting: Urology

## 2020-10-24 DIAGNOSIS — N401 Enlarged prostate with lower urinary tract symptoms: Secondary | ICD-10-CM | POA: Insufficient documentation

## 2020-10-24 DIAGNOSIS — N138 Other obstructive and reflux uropathy: Secondary | ICD-10-CM | POA: Insufficient documentation

## 2020-10-25 ENCOUNTER — Other Ambulatory Visit: Payer: Self-pay | Admitting: *Deleted

## 2020-10-25 DIAGNOSIS — N529 Male erectile dysfunction, unspecified: Secondary | ICD-10-CM

## 2020-10-25 DIAGNOSIS — N138 Other obstructive and reflux uropathy: Secondary | ICD-10-CM

## 2020-10-25 LAB — TESTOSTERONE: Testosterone: 211 ng/dL — ABNORMAL LOW (ref 264–916)

## 2020-10-26 ENCOUNTER — Other Ambulatory Visit
Admission: RE | Admit: 2020-10-26 | Discharge: 2020-10-26 | Disposition: A | Discharge status: Home / Self Care | Payer: No Typology Code available for payment source | Attending: Urology | Admitting: Urology

## 2020-10-26 DIAGNOSIS — N401 Enlarged prostate with lower urinary tract symptoms: Secondary | ICD-10-CM | POA: Insufficient documentation

## 2020-10-26 DIAGNOSIS — N529 Male erectile dysfunction, unspecified: Secondary | ICD-10-CM | POA: Insufficient documentation

## 2020-10-26 DIAGNOSIS — N138 Other obstructive and reflux uropathy: Secondary | ICD-10-CM | POA: Insufficient documentation

## 2020-10-27 LAB — TESTOSTERONE: Testosterone: 198 ng/dL — ABNORMAL LOW (ref 264–916)

## 2020-11-02 ENCOUNTER — Other Ambulatory Visit: Payer: Self-pay

## 2020-11-02 ENCOUNTER — Other Ambulatory Visit: Payer: Self-pay | Admitting: Internal Medicine

## 2020-11-02 MED FILL — Semaglutide Soln Pen-inj 0.25 or 0.5 MG/DOSE (2 MG/1.5ML): SUBCUTANEOUS | 84 days supply | Qty: 4.5 | Fill #0 | Status: AC

## 2020-11-05 ENCOUNTER — Telehealth: Payer: Self-pay | Admitting: Cardiovascular Disease

## 2020-11-05 NOTE — Telephone Encounter (Signed)
[facility] sent a fax asking if patient can take Cialis 5mg  daily

## 2020-11-05 NOTE — Telephone Encounter (Signed)
That's fine but we have to stop sl NTG as both should not be mixed

## 2020-11-07 ENCOUNTER — Other Ambulatory Visit: Payer: Self-pay

## 2020-11-07 MED ORDER — TADALAFIL 5 MG PO TABS
5.0000 mg | ORAL_TABLET | Freq: Every day | ORAL | 6 refills | Status: DC | PRN
Start: 2020-11-07 — End: 2021-10-16
  Filled 2020-11-07: qty 30, 30d supply, fill #0
  Filled 2020-12-10: qty 30, 30d supply, fill #1
  Filled 2021-03-15: qty 30, 30d supply, fill #2
  Filled 2021-04-24: qty 30, 30d supply, fill #3
  Filled 2021-05-26: qty 30, 30d supply, fill #4
  Filled 2021-07-15: qty 30, 30d supply, fill #5
  Filled 2021-08-28: qty 30, 30d supply, fill #6

## 2020-11-07 MED FILL — Metformin HCl Tab ER 24HR 500 MG: ORAL | 90 days supply | Qty: 180 | Fill #2 | Status: AC

## 2020-11-07 NOTE — Telephone Encounter (Signed)
Message received from [facility] at [facility] regarding clearance for the patient to take Cialis 5mg  and Dr. Tyrell Antonio recommendation.  See notes attached.

## 2020-11-07 NOTE — Telephone Encounter (Signed)
Spoke with tracy ( per DPR) rx sent to pharmacy by e-script

## 2020-11-07 NOTE — Telephone Encounter (Signed)
Called and left a message on the clinical line for [facility] and informed them of Dr. Tyrell Antonio statement as noted below. Encouraged a call back if they had any questions. I also reached out to the patient and gave him Dr. Tyrell Antonio recommendation. Patient stated that he never takes the NTG. Patient was very grateful for the follow up.

## 2020-11-07 NOTE — Addendum Note (Signed)
Addended by: Despina Hidden on: 11/07/2020 11:39 AM   Modules accepted: Orders

## 2020-11-11 NOTE — Progress Notes (Signed)
11/12/2020 7:18 PM   Curtis Singh February 02, 1955 [id number]  Referring provider: Malachi Bonds, MD [address] [place],  [place] [address]  Chief Complaint  Patient presents with   Follow-up    Discuss medication    Urological history: 1. ED -contributing factors of age, BPH, diabetes, HTN, HLD, former smoker, CAD and alcohol consumption -SHIM 15  2. BPH with LU TS -PSA 0.25 in 10/2020  -I PSS 24/3   HPI: Curtis Singh is a 65 y.o. male who presents today for follow up.  He was found to be testosterone deficient after a visit for ED and two morning testosterones below 300.    He is here to discuss testosterone therapy.  We have received cardiac clearance for his daily tadalafil 5 mg daily.  He is having spontaneous erections at this time.    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    HLD (hyperlipidemia)    Hypertension    Seasonal allergies     Surgical History: Past Surgical History:  Procedure Laterality Date   ARTHOSCOPIC ROTAOR CUFF REPAIR Left 02/25/2018   Procedure: LEFT ARTHROSCOPIC ROTATOR CUFF REPAIR; SUBACROMIAL DECOMPRESSION;  Surgeon: Tania Ade, MD;  [facility];  Service: Orthopedics;  Laterality: Left;   CATARACT EXTRACTION W/PHACO Right 09/03/2016   Procedure: CATARACT EXTRACTION PHACO AND INTRAOCULAR LENS PLACEMENT ([place]) RIGHT DIABETIC;  Surgeon: Leandrew Koyanagi, MD;  Location: [facility];  Service: Ophthalmology;  Laterality: Right;  DIABETIC   COLONOSCOPY WITH PROPOFOL N/A 08/29/2019   Procedure: COLONOSCOPY WITH PROPOFOL;  Surgeon: Lin Landsman, MD;  Location: [facility] ENDOSCOPY;  Service: Gastroenterology;  Laterality: N/A;   CORONARY STENT INTERVENTION N/A 12/16/2019   Procedure: CORONARY  STENT INTERVENTION;  Surgeon: Wellington Hampshire, MD;  Location: [facility];  Service: Cardiovascular;  Laterality: N/A;   LEFT HEART CATH AND CORONARY ANGIOGRAPHY N/A 12/16/2019   Procedure: LEFT HEART CATH AND CORONARY ANGIOGRAPHY;  Surgeon: Wellington Hampshire, MD;  Location: [facility];  Service: Cardiovascular;  Laterality: N/A;   ROTATOR CUFF REPAIR Right 2009    Home Medications:  Allergies as of 11/12/2020       Reactions   Latex Rash        Medication List        Accurate as of November 12, 2020 11:59 PM. If you have any questions, ask your nurse or doctor.          aspirin EC 81 MG tablet Take 1 tablet (81 mg total) by mouth daily.   Brilinta 90 MG Tabs tablet Generic drug: ticagrelor TAKE 1 TABLET (90 MG TOTAL) BY MOUTH 2 (TWO) TIMES DAILY.   FreeStyle Freedom Lite w/Device Kit use as directed   losartan 25 MG tablet Commonly known as: COZAAR TAKE 1 TABLET BY MOUTH DAILY.   metFORMIN 500 MG 24 hr tablet Commonly known as: GLUCOPHAGE-XR TAKE 1 TABLET BY MOUTH TWICE DAILY   metoprolol succinate 50 MG 24 hr tablet Commonly known as: TOPROL-XL TAKE 1 TABLET BY MOUTH DAILY. TAKE WITH OR IMMEDIATELY FOLLOWING A MEAL.   multivitamin with minerals Tabs tablet Take 1 tablet by mouth at bedtime.   NONFORMULARY OR COMPOUNDED ITEM Trimix (  30/1/10)-(Pap/Phent/PGE) Dosage: Inject 1.0 cc per injection Prefilled syringe : 10 syringes Qty10 [place] [phone] Fax [phone]   omeprazole 20 MG capsule Commonly known as: PRILOSEC TAKE 1 CAPSULE BY MOUTH DAILY.   Ozempic (0.25 or 0.5 MG/DOSE) 2 MG/1.5ML Sopn Generic drug: Semaglutide(0.25 or 0.5MG/DOS) INJECT 0.5 MG INTO THE SKIN ONCE A WEEK   rosuvastatin 40 MG tablet Commonly known as: CRESTOR TAKE 1 TABLET (40 MG TOTAL) BY MOUTH AT BEDTIME.   sildenafil 100 MG tablet Commonly known as: VIAGRA Take 1 tablet (100 mg total) by mouth daily as needed for erectile  dysfunction.   tadalafil 5 MG tablet Commonly known as: CIALIS Take 1 tablet (5 mg total) by mouth daily as needed for erectile dysfunction.   testosterone cypionate 200 MG/ML injection Commonly known as: DEPOTESTOSTERONE CYPIONATE Inject 1 mL (200 mg total) into the muscle every 14 (fourteen) days. Started by: Zara Council, PA-C   Unifine Pentips 29G X 12MM Misc Generic drug: Insulin Pen Needle Use to inject insulin daily   zolpidem 10 MG tablet Commonly known as: AMBIEN Take 1 tablet (10 mg total) by mouth at bedtime as needed for sleep.   zolpidem 10 MG tablet Commonly known as: AMBIEN Take 1 tablet by mouth at bedtime as needed for sleep        Allergies:  Allergies  Allergen Reactions   Latex Rash    Family History: Family History  Problem Relation Age of Onset   Heart attack Father    Heart attack Maternal Uncle    Heart attack Maternal Uncle    Heart attack Maternal Uncle    Heart attack Maternal Uncle    Colon cancer Neg Hx    Esophageal cancer Neg Hx    Rectal cancer Neg Hx    Stomach cancer Neg Hx    Kidney cancer Neg Hx    Kidney disease Neg Hx    Prostate cancer Neg Hx    Urolithiasis Neg Hx     Social History:  reports that he quit smoking about 38 years ago. His smoking use included cigarettes. He has never used smokeless tobacco. He reports current alcohol use of about 24.0 standard drinks per week. He reports that he does not use drugs.  ROS For pertinent review of systems please refer to history of present illness   Physical Exam: BP 136/71   Pulse 81   Ht 5' 10.5" (1.791 m)   Wt 202 lb (91.6 kg)   BMI 28.57 kg/m   Constitutional:  Well nourished. Alert and oriented, No acute distress. HEENT: [place] AT, mask in place.  Trachea midline Cardiovascular: No clubbing, cyanosis, or edema. Respiratory: Normal respiratory effort, no increased work of breathing. Neurologic: Grossly intact, no focal deficits, moving all 4  extremities. Psychiatric: Normal mood and affect.   Laboratory Data: Component     Latest Ref Rng & Units 09/22/2016 09/25/2016 10/01/2016 10/28/2016  Testosterone     264 - 916 ng/dL 240 (L) 258 (L) 417 397   Component     Latest Ref Rng & Units 10/22/2020 10/24/2020 10/26/2020  Testosterone     264 - 916 ng/dL 197 (L) 211 (L) 198 (L)   Component     Latest Ref Rng & Units 06/18/2017 08/09/2018 07/04/2019 10/22/2020  Prostatic Specific Antigen     0.00 - 4.00 ng/mL 0.36 0.32 0.39 0.25   Assessment & Plan:    1. Erectile dysfunction -achieving spontaneous erections with tadalafil 5 mg daily  2. Testosterone deficiency -Significant symptoms -Recommend starting TRT -We discussed the most common forms of replacement including intramuscular injection and gels and he desires to start injections -Rx testosterone cypionate-200 mg every 2 weeks to start -Appointment will be made for injection training -Follow-up 6 weeks after starting TRT for testosterone level and symptom check -Potential side effects of testosterone replacement were discussed including stimulation of benign prostatic growth with lower urinary tract symptoms; erythrocytosis; edema; gynecomastia; worsening sleep apnea; venous thromboembolism; testicular atrophy and infertility. Recent studies suggesting an increased incidence of heart attack and stroke in patients taking testosterone was discussed. He was informed there is conflicting evidence regarding the impact of testosterone therapy on cardiovascular risk. The theoretical risk of growth stimulation of an undetected prostate cancer was also discussed.  He was informed that current evidence does not provide any definitive answers regarding the risks of testosterone therapy on prostate cancer and cardiovascular disease. The need for periodic monitoring of his testosterone level, PSA, hematocrit and DRE was discussed.     Return for testosterone injection training .  These  notes generated with voice recognition software. I apologize for typographical errors.  Zara Council, PA-C  [facility] [address] [place], [place] [address] [phone]  I spent 15 minutes on the day of the encounter to include pre-visit record review, face-to-face time with the patient, and post-visit ordering of tests.

## 2020-11-12 ENCOUNTER — Ambulatory Visit: Payer: No Typology Code available for payment source | Admitting: Urology

## 2020-11-12 ENCOUNTER — Encounter: Payer: Self-pay | Admitting: Urology

## 2020-11-12 ENCOUNTER — Other Ambulatory Visit: Payer: Self-pay

## 2020-11-12 VITALS — BP 136/71 | HR 81 | Ht 70.5 in | Wt 202.0 lb

## 2020-11-12 DIAGNOSIS — E349 Endocrine disorder, unspecified: Secondary | ICD-10-CM

## 2020-11-12 DIAGNOSIS — N529 Male erectile dysfunction, unspecified: Secondary | ICD-10-CM

## 2020-11-12 MED ORDER — TESTOSTERONE CYPIONATE 200 MG/ML IM SOLN
200.0000 mg | INTRAMUSCULAR | 0 refills | Status: DC
  Filled 2020-11-12: qty 3, 42d supply, fill #0
  Filled 2020-11-14 – 2020-11-21 (×2): qty 6, 84d supply, fill #0

## 2020-11-14 ENCOUNTER — Other Ambulatory Visit: Payer: Self-pay

## 2020-11-21 ENCOUNTER — Other Ambulatory Visit: Payer: Self-pay

## 2020-11-22 ENCOUNTER — Other Ambulatory Visit: Payer: Self-pay

## 2020-11-22 MED FILL — Losartan Potassium Tab 25 MG: ORAL | 90 days supply | Qty: 90 | Fill #2 | Status: AC

## 2020-11-23 NOTE — Progress Notes (Signed)
Curtis Singh is a 65 y.o.  male with testosterone deficiency who presents today for instruction on delivering an IM injection of testosterone cypionate.    I instructed the patient to identify the concentration of his testosterone. Testosterone for injection is usually in the form of testosterone cypionate. These liquids come in multiple concentrations, so before giving an injection, it's very important to make sure that his intended dosage takes into account the concentration of the testosterone serum. Usually, testosterone comes in a concentration of either 100 mg/ml or 200 mg/ml.  We typically use the 200 mg/mL in this office.    Using a sterile, 18 G needle and 3 cc syringe, the testosterone cypionate (lot # [id number] exp 05/2022) was drawn up for a 1  cc injection.  To draw up the dose, I demonstrated how to first draw air into the syringe equal to the volume of the dosage. Then, wipe the top of the medication bottle with an alcohol wipe, insert the needle through the lid and into the medication, and push the air from your syringe into the bottle. Turn the bottle upside down and draw out the exact dosage of testosterone.  I demonstrated how to aspirate the syringe by hinge the syringe with its needle uncapped and pointing up in front of him.  Looking for air bubbles in the syringe. Flick the side of the syringe to get these bubbles to rise to the top.    When the dosage is bubble-free, I slowly depressed the plunger to force the air at the top of the syringe out stopping when a tiny drop of medication comes out of the tip of the syringe.  I advised him to be certain no air remained in the syringe as injecting air is very dangerous.  Being careful not to squirt or spray a significant portion of the dosage onto the floor.  Preparing the injection site, outer middle third of the right vastus lateralis muscle of the thigh, I took a sterile alcohol pad and wipe the immediate area around where I intended him to  inject.   I then demonstrated how to change the needle from the 18 G to the 21 G needle.  I then gave him the syringe for injecting.  He correctly identified the injection location.  He held the syringe like a dart at a 90-degree angle above the sterile injection site. Quickly plunged it into the flesh. Before depressing the plunger, he drew back on it slightly and no blood was seen.  I advised him that if blood flashed in the syringe, he would need to remove the needle and then select a different location as he was in the vein.  Inject the medication at a steady, controlled pace.  He fully depressed the plunger, slowly pull the needle out. Pressing around the injection site with a sterile cotton swab as he did so - this preventing the emerging needle from pulling on the skin and causing extra pain.  We assessed the needle entry point for bleeding, and applied a sterile Band-Aid and/or cotton swab if needed. Disposed of the used needle and syringe in a proper sharps container.  I advised him to acquire a sharps container for his personal use at home.  I advised him that If, after injection, he experienced redness, swelling, or discomfort beyond that of normal soreness at the site of injection, call our office for an appointment and instructions.  He is to always store his medication at the recommended temperature,  and always check the expiration date on the bottle. If it's expired, don't use it.  Of course, keep all of med's out of reach of children.  Do not change his dose without consulting your provider.  His starting dose will be 1 cc every 2 weeks.  He will return 1 week after his fourth injection for a testosterone level  Cacey Willow, PA-C  I spent 15 minutes on the day of the encounter to include pre-visit record review, face-to-face time with the patient, and post-visit ordering of tests.

## 2020-11-26 ENCOUNTER — Other Ambulatory Visit: Payer: Self-pay

## 2020-11-26 ENCOUNTER — Encounter: Payer: Self-pay | Admitting: Urology

## 2020-11-26 ENCOUNTER — Ambulatory Visit (INDEPENDENT_AMBULATORY_CARE_PROVIDER_SITE_OTHER): Payer: No Typology Code available for payment source | Admitting: Urology

## 2020-11-26 VITALS — BP 173/87 | HR 90 | Ht 70.0 in | Wt 200.0 lb

## 2020-11-26 DIAGNOSIS — E349 Endocrine disorder, unspecified: Secondary | ICD-10-CM | POA: Diagnosis not present

## 2020-11-27 ENCOUNTER — Other Ambulatory Visit: Payer: Self-pay | Admitting: Surgery

## 2020-12-03 ENCOUNTER — Ambulatory Visit: Payer: No Typology Code available for payment source | Admitting: Internal Medicine

## 2020-12-05 ENCOUNTER — Encounter
Admission: RE | Admit: 2020-12-05 | Discharge: 2020-12-05 | Disposition: A | Discharge status: Home / Self Care | Payer: No Typology Code available for payment source | Source: Ambulatory Visit | Attending: Surgery | Admitting: Surgery

## 2020-12-05 ENCOUNTER — Other Ambulatory Visit: Payer: Self-pay

## 2020-12-05 VITALS — BP 145/76 | HR 73 | Temp 98.2°F | Resp 20 | Ht 70.5 in | Wt 200.0 lb

## 2020-12-05 DIAGNOSIS — Z01818 Encounter for other preprocedural examination: Secondary | ICD-10-CM | POA: Insufficient documentation

## 2020-12-05 DIAGNOSIS — E349 Endocrine disorder, unspecified: Secondary | ICD-10-CM | POA: Insufficient documentation

## 2020-12-05 DIAGNOSIS — Z01812 Encounter for preprocedural laboratory examination: Secondary | ICD-10-CM

## 2020-12-05 LAB — COMPREHENSIVE METABOLIC PANEL
ALT: 31 U/L (ref 0–44)
AST: 23 U/L (ref 15–41)
Albumin: 4.5 g/dL (ref 3.5–5.0)
Alkaline Phosphatase: 60 U/L (ref 38–126)
Anion gap: 6 (ref 5–15)
BUN: 13 mg/dL (ref 8–23)
CO2: 25 mmol/L (ref 22–32)
Calcium: 9.2 mg/dL (ref 8.9–10.3)
Chloride: 104 mmol/L (ref 98–111)
Creatinine, Ser: 0.77 mg/dL (ref 0.61–1.24)
GFR, Estimated: >60 mL/min (ref >60)
Glucose, Bld: 283 mg/dL — ABNORMAL HIGH (ref 70–99)
Potassium: 3.7 mmol/L (ref 3.5–5.1)
Sodium: 135 mmol/L (ref 135–145)
Total Bilirubin: 0.9 mg/dL (ref 0.3–1.2)
Total Protein: 6.9 g/dL (ref 6.5–8.1)

## 2020-12-05 LAB — CBC WITH DIFFERENTIAL/PLATELET
Abs Immature Granulocytes: 0.07 10*3/uL (ref 0.00–0.07)
Basophils Absolute: 0.1 10*3/uL (ref 0.0–0.1)
Basophils Relative: 1 %
Eosinophils Absolute: 0.1 10*3/uL (ref 0.0–0.5)
Eosinophils Relative: 2 %
HCT: 45.4 % (ref 39.0–52.0)
Hemoglobin: 15.1 g/dL (ref 13.0–17.0)
Immature Granulocytes: 1 %
Lymphocytes Relative: 30 %
Lymphs Abs: 2.2 10*3/uL (ref 0.7–4.0)
MCH: 30.8 pg (ref 26.0–34.0)
MCHC: 33.3 g/dL (ref 30.0–36.0)
MCV: 92.5 fL (ref 80.0–100.0)
Monocytes Absolute: 0.6 10*3/uL (ref 0.1–1.0)
Monocytes Relative: 8 %
Neutro Abs: 4.3 10*3/uL (ref 1.7–7.7)
Neutrophils Relative %: 58 %
Platelets: 286 10*3/uL (ref 150–400)
RBC: 4.91 MIL/uL (ref 4.22–5.81)
RDW: 12.1 % (ref 11.5–15.5)
WBC: 7.2 10*3/uL (ref 4.0–10.5)
nRBC: 0 % (ref 0.0–0.2)

## 2020-12-05 LAB — URINALYSIS, ROUTINE W REFLEX MICROSCOPIC
Bilirubin Urine: NEGATIVE
Glucose, UA: >1000 mg/dL — AB
Hgb urine dipstick: NEGATIVE
Ketones, ur: NEGATIVE mg/dL
Leukocytes,Ua: NEGATIVE
Nitrite: NEGATIVE
Protein, ur: NEGATIVE mg/dL
Specific Gravity, Urine: 1.015 (ref 1.005–1.030)
pH: 5.5 (ref 5.0–8.0)

## 2020-12-05 LAB — TYPE AND SCREEN
ABO/RH(D): A POS
Antibody Screen: NEGATIVE

## 2020-12-05 LAB — SURGICAL PCR SCREEN
MRSA, PCR: NEGATIVE
Staphylococcus aureus: NEGATIVE

## 2020-12-05 NOTE — Patient Instructions (Addendum)
Your procedure is scheduled on: Thursday December 13, 2020. Report to Day Surgery inside [facility] 2nd floor. To find out your arrival time please call [phone] between 1PM - 3PM on Wednesday December 12, 2020.  Remember: Instructions that are not followed completely may result in serious medical risk,  up to and including death, or upon the discretion of your surgeon and anesthesiologist your  surgery may need to be rescheduled.     _X__ 1. Do not eat food after midnight the night before your procedure.                 No chewing gum or hard candies. You may drink clear liquids up to 2 hours                 before you are scheduled to arrive for your surgery- DO not drink clear                 liquids within 2 hours of the start of your surgery.                 Clear Liquids include:  water, Black Coffee or Tea (Do not add                 anything to coffee or tea).  __X__2.  On the morning of surgery brush your teeth with toothpaste and water, you                may rinse your mouth with mouthwash if you wish.  Do not swallow any toothpaste of mouthwash.     _X__ 3.  No Alcohol for 24 hours before or after surgery.   _X__ 4.  Do Not Smoke or use e-cigarettes For 24 Hours Prior to Your Surgery.                 Do not use any chewable tobacco products for at least 6 hours prior to                 Surgery.  _X__  5.  Do not use any recreational drugs (marijuana, cocaine, heroin, ecstasy, MDMA or other)                For at least one week prior to your surgery.  Combination of these drugs with anesthesia                May have life threatening results.  __X__ 6.  Notify your doctor if there is any change in your medical condition      (cold, fever, infections).     Do not wear jewelry, make-up, hairpins, clips or nail polish. Do not wear lotions, powders, or perfumes. You may wear deodorant. Do not shave 48 hours prior to surgery. Men may shave face and  neck. Do not bring valuables to the hospital.    [facility] is not responsible for any belongings or valuables.  Contacts, dentures or bridgework may not be worn into surgery. Leave your suitcase in the car. After surgery it may be brought to your room. For patients admitted to the hospital, discharge time is determined by your treatment team.   Patients discharged the day of surgery will not be allowed to drive home.   Make arrangements for someone to be with you for the first 24 hours of your Same Day Discharge.    Please read over the following fact sheets that you were given:   Total  Joint Packet    __X__ Take these medicines the morning of surgery with A SIP OF WATER:    1. metoprolol succinate (TOPROL-XL) 50 MG 24 hr tablet  2. omeprazole (PRILOSEC) 20 MG capsule  3.   4.  5.  6.  ____ Fleet Enema (as directed)   __X__ Use CHG Soap (or wipes) as directed  ____ Use Benzoyl Peroxide Gel as instructed  ____ Use inhalers on the day of surgery  __X__ Stop metFORMIN (GLUCOPHAGE-XR) 500 MG 24 hr tablet 2 days prior to surgery (Take last dose Monday 12/5//22)   ____ Take 1/2 of usual insulin dose the night before surgery. No insulin the morning          of surgery.   __X__ Stop aspirin and ticagrelor (BRILINTA) 90 MG and ask provider when to stop before your surgery.   __X__ One Week prior to surgery- Stop Anti-inflammatories such as Ibuprofen, Aleve, Advil, Motrin, meloxicam (MOBIC), diclofenac, etodolac, ketorolac, Toradol, Daypro, piroxicam, Goody's or BC powders. OK TO USE TYLENOL IF NEEDED   __X__ Stop supplements until after surgery.    ____ Bring C-Pap to the hospital.    If you have any questions regarding your pre-procedure instructions,  Please call Pre-admit Testing at [phone]

## 2020-12-06 ENCOUNTER — Telehealth: Payer: Self-pay | Admitting: *Deleted

## 2020-12-06 LAB — HEMOGLOBIN A1C
Hgb A1c MFr Bld: 10.1 % — ABNORMAL HIGH (ref 4.8–5.6)
Mean Plasma Glucose: 243 mg/dL

## 2020-12-06 NOTE — Telephone Encounter (Signed)
Hi Dr. Fletcher Anon,  Mr. Curtis Singh is scheduled to have a knee replacement on 12/13/2020 and surgeon is asking to hold his Aspirin and Brilinta. He has a history of CAD. He was admitted in 12/2019 with unstable angina and underwent PCI with DES to proximal to mid RCA on 12/15/2020.Otherwise, had minimal disease. Plan was for DAPT for 12 months. You last saw him in 08/2020 at which time he was doing well. Plan was to discontinue Brilinta in December. We are just shy of the 1 year mark from PCI - are you OK if he goes ahead and hold sboth Aspirin and Brilinta for his knee procedure? If so, how long can he hold each for? You are scheduled to see him on 12/11/2020 (2 days before procedure) but I was going to go ahead and at least call him with your recommendations for holding DAPT since he will need to start holding those before your visit with him.  Please route response back to P CV DIV PREOP.  Thank you! Mattew Chriswell

## 2020-12-06 NOTE — Telephone Encounter (Signed)
He can go ahead and stop Brilinta. No need to resume.  He should continue Aspirin 81 mg once daily without interruption.

## 2020-12-06 NOTE — Telephone Encounter (Signed)
Request for pre-operative cardiac clearance Received: [name], [name], NP  P Cv Div Preop Callback Request for pre-operative cardiac clearance:   1. What type of surgery is being performed?  LEFT TOTAL KNEE ARTHROPLASTY     2. When is this surgery scheduled?  12/13/2020     3. Are there any medications that need to be held prior to surgery?  ASA + ticagrelor     4. Practice name and name of physician performing surgery?  Performing surgeon: Dr. Milagros Evener, MD  Requesting clearance: Honor Loh, FNP-C       5. Anesthesia type (none, local, MAC, general)? General     6. What is the office phone and fax number?    Phone: [phone]  Fax: [phone]     ATTENTION: Unable to create telephone message as per your standard workflow. Directed by [facility] providers to send requests for cardiac clearance to this pool for appropriate distribution to provider covering pre-operative clearances.     Honor Loh, MSN, APRN, FNP-C, CEN  [facility]  Peri-operative Services Nurse Practitioner  Phone: [phone]  12/05/20 10:32 PM

## 2020-12-06 NOTE — Telephone Encounter (Signed)
    Patient Name: Curtis Singh  DOB: November 04, 1955 MRN: [id number]  Primary Cardiologist: [name], MD  Chart reviewed as part of pre-operative protocol coverage. Patient was last seen by Dr. Fletcher Anon in 08/2020 at which time he was doing well from a cardiac standpoint. Patient was contacted today for further pre-op evaluation and reported doing well since last visit. He denied any chest pain. He has occasional transient shortness of breath which sounds consistent with Brilinta but no other significant shortness of breath. No orthopnea, PND, palpitations, dizziness, syncope. He is able to complete >4.0 METS of activity without any angina. Given past medical history and time since last visit, based on ACC/AHA guidelines, JESSEJAMES STEELMAN would be at acceptable risk for the planned procedure without further cardiovascular testing.   Discussed antiplatelet therapy with Dr. Fletcher Anon. Patient can hold Brilinta for 5-7 days prior to procedure. Given we are almost one year out from his PCI, he does not need to resume this after surgery. However, Dr. Fletcher Anon would like patient to continue Aspirin without interruption perioperatively.   I will route this recommendation to the requesting party via Epic fax function and remove from pre-op pool.  Please call with questions.  Darreld Mclean, PA-C 12/06/2020, 3:54 PM

## 2020-12-06 NOTE — Progress Notes (Signed)
  Perioperative Services: Pre-Admission/Anesthesia Testing  Abnormal Lab Notification    Date: 12/06/20  Name: Curtis Singh MRN:   [id number]  Re: Abnormal labs noted during PAT appointment   Provider(s) Notified: Milagros Evener, MD Notification mode: Routed and/or faxed via [facility]   ABNORMAL LAB VALUE(S): Lab Results  Component Value Date   GLUCOSE 283 (H) 12/05/2020    Notes: Patient with a T2DM diagnosis. He is currently on both oral (metformin) and parenteral (semaglutide) therapies. Last Hgb A1c was 10.1% on 12/05/2020. In efforts to reduce his risk of developing SSI/PJI, or other potential perioperative complications, this communication is being sent in order to determine if patient is deemed to have adequate medical optimization, including preoperative glycemic control.   The odds ratio for SSI/PJI infection is between 2.8 and 3.4 for orthopedic surgery patients with pre-operative serum glucose levels of > 125 mg/dL or a post-operative levels of > 200 mg/dL ([place], 2019).    Data suggests that a Hgb A1c threshold of 7.7% tends to be more indicative of infection than the commonly used 7% and should perhaps be the pre-operative patient optimization goal Carolin Guernsey et al., 2017).   With that being said, the benefit of improving glycemic control must be weighed against the overall risk associated with delaying a necessary elective orthopedic surgery for this patient.   Citations: Charlett Blake, A.F. Reducing the risk of infection after total joint arthroplasty: preoperative optimization. Arthroplasty 1, 4 (2019). [email or web address]  Lorrin Goodell MM, [place], Brigati D, [place] SM, [address] [place] JC, [place], [place], [place], Parvizi Lenna Sciara [place]. Determining the Threshold for HbA1c as a Predictor for Adverse Outcomes After Total Joint Arthroplasty: A Multicenter, Retrospective Study. J Arthroplasty. 2017  Sep;32(9S):S263-S267.e1. [email or web address].   Honor Loh, MSN, APRN, FNP-C, CEN [facility]  Peri-operative Services Nurse Practitioner Phone: [phone] 12/06/20 7:40 AM

## 2020-12-07 ENCOUNTER — Encounter: Payer: Self-pay | Admitting: Surgery

## 2020-12-07 NOTE — Progress Notes (Addendum)
Perioperative Services  Pre-Admission/Anesthesia Testing Clinical Review  Date: 12/07/20  Patient Demographics:  Name: Curtis Singh DOB:   17-Jun-1955 MRN:   [id number]  Planned Surgical Procedure(s):    Case: [id number] Date/Time: 12/13/20 0715   Procedure: TOTAL KNEE ARTHROPLASTY (Left: Knee)   Anesthesia type: Choice   Pre-op diagnosis: PRIMARY OSTEOARTHRITIS OF LEFT KNEE.   Location: [facility] OR ROOM 03 / [place] [facility] FOR ANESTHESIA GROUP   Surgeons: Corky Mull, MD   NOTE: Available PAT nursing documentation and vital signs have been reviewed. Clinical nursing staff has updated patient's PMH/PSHx, current medication list, and drug allergies/intolerances to ensure comprehensive history available to assist in medical decision making as it pertains to the aforementioned surgical procedure and anticipated anesthetic course. Extensive review of available clinical information performed. Curtis Singh PMH and PSHx updated with any diagnoses/procedures that  may have been inadvertently omitted during his intake with the pre-admission testing department's nursing staff.  Clinical Discussion:  Curtis Singh is a 65 y.o. male who is submitted for pre-surgical anesthesia review and clearance prior to him undergoing the above procedure. Patient is a Former Smoker (quit 01/1982). Pertinent PMH includes: CAD, aortic atherosclerosis, HTN, HLD, T2DM, GERD (on daily PPI), ED (on PDE5i and exogenous testosterone), OA.   Patient is followed by cardiology Fletcher Anon, MD). He was last seen in the cardiology clinic on 08/14/2020; notes reviewed.  At the time of his clinic visit, patient doing well overall from a cardiovascular perspective.  He denied any episodes of chest pain, however he was experiencing occasional dyspnea in the mornings when waking up.  He denied any PND, orthopnea, palpitations, significant peripheral edema, vertiginous symptoms, or presyncope/syncope.  PMH significant for cardiovascular  diagnoses.  Myocardial perfusion imaging study performed on 03/03/2019 revealed a normal left ventricular systolic function with an EF of 60%.  There were nonspecific ST/T wave abnormalities in the precordial and inferior leads.  CT attenuation correction images revealed mild aortic sclerosis and mild mid RCA coronary calcifications.  There were no regional wall motion abnormalities.  No significant stress-induced myocardial ischemia. Study determined to be low risk.  Diagnostic left heart catheterization was performed on 12/16/2019 revealing a normal left ventricular systolic function with an EF of 55 to 65%.  LVEDP normal.  There was a 99% stenosis of the proximal to mid RCA and 20% stenosis of the mid RCA.  PCI was subsequently performed placing a 3.0 x 22 mm Resolute Onyx DES x 1 to the proximal RCA resulting in 0% residual stenosis and TIMI-3 flow.  Following stent placement, patient continued on daily DAPT therapy (ASA + ticagrelor); compliant with therapy with no evidence or reports of GI bleeding.  Plans were to discontinue ticagrelor in 12/2020 and continue daily low-dose ASA.  Blood pressure well controlled at 128/70 on currently prescribed ARB and beta-blocker therapies.  Patient is on a statin for his HLD.  T2DM uncontrolled on currently prescribed regimen; last Hgb A1c at that time was 7.5% when checked on 03/29/2020. Patient is on PDE5i medications and exogenous testosterone for his ED diagnosis. Functional capacity, as defined by DASI, is documented as being >/= 4 METS.  No changes were made to his medication regimen.  Patient follow-up with outpatient cardiology in 4 months or sooner if needed.  Curtis Singh is scheduled for an elective LEFT TOTAL KNEE ARTHROPLASTY on 12/13/2020 with Dr. Milagros Evener, MD. Given patient's past medical history significant for cardiovascular diagnoses, presurgical cardiac clearance was sought by the PAT team.  Per cardiology, "based ACC/AHA guidelines, the  patient's past medical history, and the amount of time since his last clinic visit, this patient would be at an overall ACCEPTABLE risk for the planned procedure without further cardiovascular testing or intervention at this time".  Again, this patient is on daily DAPT therapy (ASA + ticagrelor).  He has been instructed on recommendations for holding his ticagrelor for 5 to 7 days prior to his procedure with plans to discontinue postoperatively as it has been a year since PCI with stent placement.  Patient will continue his daily low-dose ASA throughout the perioperative period.    Patient denies previous perioperative complications with anesthesia in the past. In review of the available records, it is noted that patient underwent a general anesthetic course here (ASA III) in 08/2019 without documented complications.   Vitals with BMI 12/05/2020 11/26/2020 11/12/2020  Height 5' 10.5" _0  5' 10.5"  Weight 200 lbs 200 lbs 202 lbs  BMI 28.28 29.1 91.66  Systolic 060 045 997  Diastolic 76 87 71  Pulse 73 90 81    Providers/Specialists:   NOTE: Primary physician provider listed below. Patient may have been seen by APP or partner within same practice.   PROVIDER ROLE / SPECIALTY LAST OV  Poggi, Marshall Cork, MD Orthopedics 08/22/2020  Cletis Athens, MD Primary Care Provider 10/02/2020  [name], MD Cardiology 08/14/2020   Allergies:  Latex  Current Home Medications:   No current facility-administered medications for this encounter.    acetaminophen (TYLENOL) 500 MG tablet   aspirin EC 81 MG tablet   fluticasone (FLONASE) 50 MCG/ACT nasal spray   losartan (COZAAR) 25 MG tablet   metFORMIN (GLUCOPHAGE-XR) 500 MG 24 hr tablet   metoprolol succinate (TOPROL-XL) 50 MG 24 hr tablet   Multiple Vitamin (MULTIVITAMIN WITH MINERALS) TABS tablet   omeprazole (PRILOSEC) 20 MG capsule   rosuvastatin (CRESTOR) 40 MG tablet   Semaglutide,0.25 or 0.5MG/DOS, (OZEMPIC, 0.25 OR 0.5 MG/DOSE,) 2  MG/1.5ML SOPN   tadalafil (CIALIS) 5 MG tablet   testosterone cypionate (DEPOTESTOSTERONE CYPIONATE) 200 MG/ML injection   ticagrelor (BRILINTA) 90 MG TABS tablet   zolpidem (AMBIEN) 10 MG tablet   Blood Glucose Monitoring Suppl (FREESTYLE FREEDOM LITE) w/Device KIT   NONFORMULARY OR COMPOUNDED ITEM   sildenafil (VIAGRA) 100 MG tablet   UNIFINE PENTIPS 29G X 12MM MISC   History:   Past Medical History:  Diagnosis Date   Coronary artery disease 12/2019   a.) LHC 12/16/2019: EF 55-65%, LVEDP norm. 99% p-mRCA, 20% mRCA. --> PCI placing a 3.0 x 22 mm Resolute Onyx DES to pRCA.   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    HLD (hyperlipidemia)    Hypertension    Long term current use of antithrombotics/antiplatelets    a.) DAPT (ASA + ticagrelor)   Osteoarthritis    Seasonal allergies    T2DM (type 2 diabetes mellitus) ([place])    Past Surgical History:  Procedure Laterality Date   ARTHOSCOPIC ROTAOR CUFF REPAIR Left 02/25/2018   Procedure: LEFT ARTHROSCOPIC ROTATOR CUFF REPAIR; SUBACROMIAL DECOMPRESSION;  Surgeon: Tania Ade, MD;  Location: [facility];  Service: Orthopedics;  Laterality: Left;   CATARACT EXTRACTION W/PHACO Right 09/03/2016   Procedure: CATARACT EXTRACTION PHACO AND INTRAOCULAR LENS PLACEMENT ([place]) RIGHT DIABETIC;  Surgeon: Leandrew Koyanagi, MD;  Location: [facility];  Service: Ophthalmology;  Laterality: Right;  DIABETIC   COLONOSCOPY WITH PROPOFOL N/A  08/29/2019   Procedure: COLONOSCOPY WITH PROPOFOL;  Surgeon: Lin Landsman, MD;  Location: [facility] ENDOSCOPY;  Service: Gastroenterology;  Laterality: N/A;   CORONARY STENT INTERVENTION N/A 12/16/2019   Procedure: CORONARY STENT INTERVENTION (3.0 x 22 mm Resolute Onyx DES to pRCA);  Surgeon: Wellington Hampshire, MD;  Location: [facility];  Service: Cardiovascular;  Laterality: N/A;   LEFT HEART CATH AND CORONARY ANGIOGRAPHY  N/A 12/16/2019   Procedure: LEFT HEART CATH AND CORONARY ANGIOGRAPHY;  Surgeon: Wellington Hampshire, MD;  Location: [facility];  Service: Cardiovascular;  Laterality: N/A;   ROTATOR CUFF REPAIR Right 01/07/2007   VASECTOMY N/A    Family History  Problem Relation Age of Onset   Heart attack Father    Heart attack Maternal Uncle    Heart attack Maternal Uncle    Heart attack Maternal Uncle    Heart attack Maternal Uncle    Colon cancer Neg Hx    Esophageal cancer Neg Hx    Rectal cancer Neg Hx    Stomach cancer Neg Hx    Kidney cancer Neg Hx    Kidney disease Neg Hx    Prostate cancer Neg Hx    Urolithiasis Neg Hx    Social History   Tobacco Use   Smoking status: Former    Types: Cigarettes    Quit date: 01/06/1982    Years since quitting: 38.9   Smokeless tobacco: Never  Vaping Use   Vaping Use: Never used  Substance Use Topics   Alcohol use: Yes    Alcohol/week: 24.0 standard drinks    Types: 24 Cans of beer per week    Comment: 1 case on weekend   Drug use: No    Pertinent Clinical Results:  LABS: Labs reviewed: Acceptable for surgery.  Hospital Outpatient Visit on 12/05/2020  Component Date Value Ref Range Status   MRSA, PCR 12/05/2020 NEGATIVE  NEGATIVE Final   Staphylococcus aureus 12/05/2020 NEGATIVE  NEGATIVE Final   Comment: (NOTE) The Xpert SA Assay (FDA approved for NASAL specimens in patients 70 years of age and older), is one component of a comprehensive surveillance program. It is not intended to diagnose infection nor to guide or monitor treatment. Performed at [facility], [address] [place], [place] [address]   WBC 12/05/2020 7.2  4.0 - 10.5 K/uL Final   RBC 12/05/2020 4.91  4.22 - 5.81 MIL/uL Final   Hemoglobin 12/05/2020 15.1  13.0 - 17.0 g/dL Final   HCT 12/05/2020 45.4  39.0 - 52.0 % Final   MCV 12/05/2020 92.5  80.0 - 100.0 fL Final   MCH 12/05/2020 30.8  26.0 - 34.0 pg Final   MCHC 12/05/2020 33.3  30.0 - 36.0 g/dL Final    RDW 12/05/2020 12.1  11.5 - 15.5 % Final   Platelets 12/05/2020 286  150 - 400 K/uL Final   nRBC 12/05/2020 0.0  0.0 - 0.2 % Final   Neutrophils Relative % 12/05/2020 58  % Final   Neutro Abs 12/05/2020 4.3  1.7 - 7.7 K/uL Final   Lymphocytes Relative 12/05/2020 30  % Final   Lymphs Abs 12/05/2020 2.2  0.7 - 4.0 K/uL Final   Monocytes Relative 12/05/2020 8  % Final   Monocytes Absolute 12/05/2020 0.6  0.1 - 1.0 K/uL Final   Eosinophils Relative 12/05/2020 2  % Final   Eosinophils Absolute 12/05/2020 0.1  0.0 - 0.5 K/uL Final   Basophils Relative 12/05/2020 1  % Final  Basophils Absolute 12/05/2020 0.1  0.0 - 0.1 K/uL Final   Immature Granulocytes 12/05/2020 1  % Final   Abs Immature Granulocytes 12/05/2020 0.07  0.00 - 0.07 K/uL Final   Performed at [facility], [place], [place] [address]   Sodium 12/05/2020 135  135 - 145 mmol/L Final   Potassium 12/05/2020 3.7  3.5 - 5.1 mmol/L Final   Chloride 12/05/2020 104  98 - 111 mmol/L Final   CO2 12/05/2020 25  22 - 32 mmol/L Final   Glucose, Bld 12/05/2020 283 (H)  70 - 99 mg/dL Final   Glucose reference range applies only to samples taken after fasting for at least 8 hours.   BUN 12/05/2020 13  8 - 23 mg/dL Final   Creatinine, Ser 12/05/2020 0.77  0.61 - 1.24 mg/dL Final   Calcium 12/05/2020 9.2  8.9 - 10.3 mg/dL Final   Total Protein 12/05/2020 6.9  6.5 - 8.1 g/dL Final   Albumin 12/05/2020 4.5  3.5 - 5.0 g/dL Final   AST 12/05/2020 23  15 - 41 U/L Final   ALT 12/05/2020 31  0 - 44 U/L Final   Alkaline Phosphatase 12/05/2020 60  38 - 126 U/L Final   Total Bilirubin 12/05/2020 0.9  0.3 - 1.2 mg/dL Final   GFR, Estimated 12/05/2020 >60  >60 mL/min Final   Comment: (NOTE) Calculated using the CKD-EPI Creatinine Equation (2021)   Anion gap 12/05/2020 6  5 - 15 Final   Performed at [facility], [place], [place] [address]   Color, Urine 12/05/2020 YELLOW  YELLOW Final   APPearance  12/05/2020 CLEAR  CLEAR Final   Specific Gravity, Urine 12/05/2020 1.015  1.005 - 1.030 Final   pH 12/05/2020 5.5  5.0 - 8.0 Final   Glucose, UA 12/05/2020 >1,000 (A)  NEGATIVE mg/dL Final   Hgb urine dipstick 12/05/2020 NEGATIVE  NEGATIVE Final   Bilirubin Urine 12/05/2020 NEGATIVE  NEGATIVE Final   Ketones, ur 12/05/2020 NEGATIVE  NEGATIVE mg/dL Final   Protein, ur 12/05/2020 NEGATIVE  NEGATIVE mg/dL Final   Nitrite 12/05/2020 NEGATIVE  NEGATIVE Final   Leukocytes,Ua 12/05/2020 NEGATIVE  NEGATIVE Final   Comment: Microscopic not done on urines with negative protein, blood, leukocytes, nitrite, or glucose < 500 mg/dL. Performed at [facility], [address] [place], [place] [address]   ABO/RH(D) 12/05/2020 A POS   Final   Antibody Screen 12/05/2020 NEG   Final   Sample Expiration 12/05/2020 12/19/2020,2359   Final   Extend sample reason 12/05/2020    Final                   Value:NO TRANSFUSIONS OR PREGNANCY IN THE PAST 3 MONTHS Performed at [facility], [address] [place], [place] [address]    Lab Results  Component Value Date   HGBA1C 10.1 (H) 12/05/2020    ECG: Date: 08/14/2020 Time ECG obtained: 1603 PM Rate: 62 bpm Rhythm: normal sinus Axis (leads I and aVF): Normal Intervals: PR 182 ms. QRS 86 ms. QTc 420 ms. ST segment and T wave changes: No evidence of acute ST segment elevation or depression Comparison: Similar to previous tracing obtained on 04/22/2020   IMAGING / PROCEDURES: MRI KNEE LEFT WO CONTRAST performed on 09/04/2020 Degenerative tear of the posterior horn of the medial meniscus near the meniscal root.  Mild resulting peripheral extrusion of the meniscus from the joint space Moderate medial and patellofemoral compartment degenerative changes. No  acute osseous findings Small joint effusion and Baker's cyst Lateral meniscus, cruciate, collateral ligaments are all intact  LEFT HEART CATHETERIZATION AND CORONARY ANGIOGRAPHY  performed on 12/16/2019 Normal left ventricular systolic function Normal LVEDP Single-vessel CAD 99% stenosis of the proximal mid RCA 20% stenosis of the mid RCA Successful PCI 3.0 x 22 mm Resolute Onyx DES x1 to the proximal RCA resulting in 0% residual stenosis and TIMI-3 flow   MYOCARDIAL PERFUSION IMAGING STUDY (LEXISCAN) performed on 03/03/2019 Normal left ventricular systolic function with an EF of 60% Resting EKG with nonspecific ST and T wave abnormalities in the precordial and inferior leads CT attenuation correction imaging with mild aortic atherosclerosis and mid RCA coronary calcification Normal wall motion No evidence of significant stress-induced myocardial ischemia Low risk scan  Impression and Plan:  JAWAUN CELMER has been referred for pre-anesthesia review and clearance prior to him undergoing the planned anesthetic and procedural courses. Available labs, pertinent testing, and imaging results were personally reviewed by me.  Patient with elevated glucose level noted on preoperative labs.  Repeat hemoglobin A1c was obtained and found to be elevated 10.1%, which increases patient's risk for postoperative SSI/PJI. The odds ratio for SSI/PJI infection is between 2.8 and 3.4 for orthopedic surgery patients with pre-operative serum glucose levels of > 125 mg/dL or a post-operative levels of > 200 mg/dL ([place], 2019). Per Dr. Roland Rack, "I have contacted the patient to let him know that these results were elevated, and that he is at increased risk for infection if he were to proceed with the total knee replacement scheduled for next week. The patient notes that his knee has been quite painful lately and he is really hoping to be able to get this procedure done. He states that he understands that his risk of infection is somewhat increased because of his glucose levels, but is willing to take this chance because his knee is so painful. Therefore, we will proceed with the surgery as  scheduled next week".  This patient has been appropriately cleared by cardiology with an overall ACCEPTABLE risk of significant perioperative cardiovascular complications. Based on clinical review performed today (12/07/20), barring any significant acute changes in the patient's overall condition, it is anticipated that he will be able to proceed with the planned surgical intervention. Any acute changes in clinical condition may necessitate his procedure being postponed and/or cancelled. Patient will meet with anesthesia team (MD and/or CRNA) on the day of his procedure for preoperative evaluation/assessment. Questions regarding anesthetic course will be fielded at that time.   Pre-surgical instructions were reviewed with the patient during his PAT appointment and questions were fielded by PAT clinical staff. Patient was advised that if any questions or concerns arise prior to his procedure then he should return a call to PAT and/or his surgeon's office to discuss.  Citation: Charlett Blake, A.F. Reducing the risk of infection after total joint arthroplasty: preoperative optimization. Arthroplasty 1, 4 (2019). [email or web address]  Honor Loh, MSN, APRN, FNP-C, CEN [facility]  Peri-operative Services Nurse Practitioner Phone: [phone] Fax: [phone] 12/07/20 9:33 AM  NOTE: This note has been prepared using Dragon dictation software. Despite my best ability to proofread, there is always the potential that unintentional transcriptional errors may still occur from this process.

## 2020-12-10 ENCOUNTER — Other Ambulatory Visit: Payer: Self-pay

## 2020-12-10 MED FILL — Rosuvastatin Calcium Tab 40 MG: ORAL | 90 days supply | Qty: 90 | Fill #2 | Status: AC

## 2020-12-11 ENCOUNTER — Other Ambulatory Visit
Admission: RE | Admit: 2020-12-11 | Discharge: 2020-12-11 | Disposition: A | Discharge status: Home / Self Care | Payer: No Typology Code available for payment source | Source: Ambulatory Visit | Attending: Surgery | Admitting: Surgery

## 2020-12-11 ENCOUNTER — Encounter: Payer: Self-pay | Admitting: Cardiovascular Disease

## 2020-12-11 ENCOUNTER — Other Ambulatory Visit: Payer: Self-pay

## 2020-12-11 ENCOUNTER — Ambulatory Visit (INDEPENDENT_AMBULATORY_CARE_PROVIDER_SITE_OTHER): Payer: No Typology Code available for payment source | Admitting: Cardiovascular Disease

## 2020-12-11 VITALS — BP 140/74 | HR 68 | Ht 70.5 in | Wt 200.0 lb

## 2020-12-11 DIAGNOSIS — I10 Essential (primary) hypertension: Secondary | ICD-10-CM | POA: Diagnosis not present

## 2020-12-11 DIAGNOSIS — Z01812 Encounter for preprocedural laboratory examination: Secondary | ICD-10-CM | POA: Diagnosis present

## 2020-12-11 DIAGNOSIS — E785 Hyperlipidemia, unspecified: Secondary | ICD-10-CM | POA: Diagnosis not present

## 2020-12-11 DIAGNOSIS — E119 Type 2 diabetes mellitus without complications: Secondary | ICD-10-CM | POA: Diagnosis not present

## 2020-12-11 DIAGNOSIS — I2510 Atherosclerotic heart disease of native coronary artery without angina pectoris: Secondary | ICD-10-CM

## 2020-12-11 DIAGNOSIS — Z20822 Contact with and (suspected) exposure to covid-19: Secondary | ICD-10-CM | POA: Diagnosis not present

## 2020-12-11 LAB — SARS CORONAVIRUS 2 (TAT 6-24 HRS): SARS Coronavirus 2: NEGATIVE

## 2020-12-11 NOTE — Patient Instructions (Signed)

## 2020-12-11 NOTE — Progress Notes (Signed)
Cardiology Office Note   Date:  12/11/2020   ID:  Curtis Singh, DOB [birth year], MRN [id number]  PCP:  P.A., Cletis Athens, MD  Cardiologist:   [name], MD  Chief Complaint  Patient presents with   Other    4 Month f/u. Meds reviewed verbally with pt.       History of Present Illness: Curtis Singh is a 65 y.o. male who is here today for follow-up visit regarding coronary artery disease.   He has known history of diabetes mellitus on insulin, hyperlipidemia, essential hypertension, aortic atherosclerosis and strong family history of coronary artery disease. He is not a smoker. He was seen in December of 2021 with unstable angina.   Cardiac catheterization showed 99% stenosis in the proximal/mid RCA and no other obstructive disease.  Ejection fraction and LVEDP were both normal.  I performed successful angioplasty and drug-eluting stent placement to the right coronary artery.  He has been doing very well with no chest pain, shortness of breath or palpitations.  The patient is scheduled for left knee replacement on Thursday of this week.  Past Medical History:  Diagnosis Date   Aortic atherosclerosis ([place])    Coronary artery disease 12/2019   a.) LHC 12/16/2019: EF 55-65%, LVEDP norm. 99% p-mRCA, 20% mRCA. --> PCI placing a 3.0 x 22 mm Resolute Onyx DES to pRCA.   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    HLD (hyperlipidemia)    Hypertension    Long term current use of antithrombotics/antiplatelets    a.) DAPT (ASA + ticagrelor)   Osteoarthritis    Seasonal allergies    T2DM (type 2 diabetes mellitus) ([place])     Past Surgical History:  Procedure Laterality Date   ARTHOSCOPIC ROTAOR CUFF REPAIR Left 02/25/2018   Procedure: LEFT ARTHROSCOPIC ROTATOR CUFF REPAIR; SUBACROMIAL DECOMPRESSION;  Surgeon: Tania Ade, MD;  Location: [facility];  Service: Orthopedics;  Laterality: Left;   CATARACT  EXTRACTION W/PHACO Right 09/03/2016   Procedure: CATARACT EXTRACTION PHACO AND INTRAOCULAR LENS PLACEMENT ([place]) RIGHT DIABETIC;  Surgeon: Leandrew Koyanagi, MD;  Location: [facility];  Service: Ophthalmology;  Laterality: Right;  DIABETIC   COLONOSCOPY WITH PROPOFOL N/A 08/29/2019   Procedure: COLONOSCOPY WITH PROPOFOL;  Surgeon: Lin Landsman, MD;  Location: [facility];  Service: Gastroenterology;  Laterality: N/A;   CORONARY STENT INTERVENTION N/A 12/16/2019   Procedure: CORONARY STENT INTERVENTION (3.0 x 22 mm Resolute Onyx DES to pRCA);  Surgeon: Wellington Hampshire, MD;  Location: [facility];  Service: Cardiovascular;  Laterality: N/A;   LEFT HEART CATH AND CORONARY ANGIOGRAPHY N/A 12/16/2019   Procedure: LEFT HEART CATH AND CORONARY ANGIOGRAPHY;  Surgeon: Wellington Hampshire, MD;  Location: [facility];  Service: Cardiovascular;  Laterality: N/A;   ROTATOR CUFF REPAIR Right 01/07/2007   VASECTOMY N/A      Current Outpatient Medications  Medication Sig Dispense Refill   acetaminophen (TYLENOL) 500 MG tablet Take 500-1,000 mg by mouth every 6 (six) hours as needed for moderate pain.     Blood Glucose Monitoring Suppl (FREESTYLE FREEDOM LITE) w/Device KIT use as directed 1 kit 0   fluticasone (FLONASE) 50 MCG/ACT nasal spray Place 2 sprays into both nostrils daily as needed for allergies or rhinitis.     losartan (COZAAR) 25 MG tablet TAKE 1 TABLET BY MOUTH DAILY. 90 tablet 3   metoprolol succinate (TOPROL-XL)  50 MG 24 hr tablet TAKE 1 TABLET BY MOUTH DAILY. TAKE WITH OR IMMEDIATELY FOLLOWING A MEAL. 90 tablet 3   omeprazole (PRILOSEC) 20 MG capsule TAKE 1 CAPSULE BY MOUTH DAILY. 90 capsule 3   rosuvastatin (CRESTOR) 40 MG tablet TAKE 1 TABLET (40 MG TOTAL) BY MOUTH AT BEDTIME. 90 tablet 3   Semaglutide,0.25 or 0.5MG/DOS, (OZEMPIC, 0.25 OR 0.5 MG/DOSE,) 2 MG/1.5ML SOPN INJECT 0.5 MG INTO THE SKIN ONCE A WEEK 1.5 mL 2   sildenafil (VIAGRA) 100 MG tablet Take  1 tablet (100 mg total) by mouth daily as needed for erectile dysfunction. 30 tablet 0   tadalafil (CIALIS) 5 MG tablet Take 1 tablet (5 mg total) by mouth daily as needed for erectile dysfunction. 30 tablet 6   testosterone cypionate (DEPOTESTOSTERONE CYPIONATE) 200 MG/ML injection Inject 1 mL (200 mg total) into the muscle every 14 (fourteen) days. 10 mL 0   UNIFINE PENTIPS 29G X 12MM MISC Use to inject insulin daily 30 each 6   zolpidem (AMBIEN) 10 MG tablet Take 1 tablet by mouth at bedtime as needed for sleep 30 tablet 1   aspirin EC 81 MG tablet Take 1 tablet (81 mg total) by mouth daily. (Patient not taking: Reported on 12/11/2020) 90 tablet 3   metFORMIN (GLUCOPHAGE-XR) 500 MG 24 hr tablet TAKE 1 TABLET BY MOUTH TWICE DAILY (Patient not taking: Reported on 12/11/2020) 180 tablet 3   Multiple Vitamin (MULTIVITAMIN WITH MINERALS) TABS tablet Take 1 tablet by mouth at bedtime. (Patient not taking: Reported on 12/11/2020)     No current facility-administered medications for this visit.    Allergies:   Latex    Social History:  The patient  reports that he quit smoking about 38 years ago. His smoking use included cigarettes. He has never used smokeless tobacco. He reports current alcohol use of about 24.0 standard drinks per week. He reports that he does not use drugs.   Family History:  The patient's family history includes Heart attack in his father, maternal uncle, maternal uncle, maternal uncle, and maternal uncle.    ROS:  Please see the history of present illness.   Otherwise, review of systems are positive for none.   All other systems are reviewed and negative.    PHYSICAL EXAM: VS:  BP 140/74 (BP Location: Left Arm, Patient Position: Sitting, Cuff Size: Normal)   Pulse 68   Ht 5' 10.5" (1.791 m)   Wt 200 lb (90.7 kg)   SpO2 98%   BMI 28.29 kg/m  , BMI Body mass index is 28.29 kg/m. GEN: Well nourished, well developed, in no acute distress  HEENT: normal  Neck: no JVD,  carotid bruits, or masses Cardiac: RRR; no  rubs, or gallops,no edema .  1/ 6 systolic murmur in the aortic area Respiratory:  clear to auscultation bilaterally, normal work of breathing GI: soft, nontender, nondistended, + BS MS: no deformity or atrophy  Skin: warm and dry, no rash Neuro:  Strength and sensation are intact Psych: euthymic mood, full affect Radial pulse is normal.  EKG:  EKG  ordered today. EKG showed normal sinus rhythm with nonspecific T wave changes.   Recent Labs: 12/05/2020: ALT 31; BUN 13; Creatinine, Ser 0.77; Hemoglobin 15.1; Platelets 286; Potassium 3.7; Sodium 135    Lipid Panel    Component Value Date/Time   CHOL 182 07/04/2019 0823   CHOL 239 (H) 01/20/2014 0729   TRIG 233 (H) 07/04/2019 0823   TRIG 171 01/20/2014 0729  HDL 69 07/04/2019 0823   HDL 48 01/20/2014 0729   CHOLHDL 2.6 07/04/2019 0823   VLDL 47 (H) 07/04/2019 0823   VLDL 34 01/20/2014 0729   LDLCALC 66 07/04/2019 0823   LDLCALC 157 (H) 01/20/2014 0729      Wt Readings from Last 3 Encounters:  12/11/20 200 lb (90.7 kg)  12/05/20 200 lb (90.7 kg)  11/26/20 200 lb (90.7 kg)        ASSESSMENT AND PLAN:  1.  Coronary artery disease involving native coronary arteries without angina: He is doing very well at the present time with no anginal symptoms.  It has been 1 year since his PCI and drug-eluting stent placement.  Ticagrelor was discontinued and will not be resumed after his knee replacement.  Continue aspirin 81 mg once daily indefinitely.  2.  Hyperlipidemia: Continue rosuvastatin 40 mg daily.  Most recent lipid profile showed an LDL of 66.    3.  Essential hypertension: Blood pressure is well controlled on current medications.  4.  Diabetes mellitus: He was able to get his hemoglobin A1c to 6.8 but unfortunately it increased again to 10.1.  I discussed with him the importance of optimal glycemic control.  He is currently on metformin and Ozempic.   Disposition:   FU with  me in 6 months  Signed, [name], MD 12/11/2020 3:48 PM    [place]

## 2020-12-13 ENCOUNTER — Ambulatory Visit: Payer: No Typology Code available for payment source | Admitting: Urgent Care

## 2020-12-13 ENCOUNTER — Encounter
Admission: RE | Disposition: A | Discharge status: Home / Self Care | Payer: Self-pay | Source: Home / Self Care | Attending: Surgery

## 2020-12-13 ENCOUNTER — Ambulatory Visit
Admission: RE | Admit: 2020-12-13 | Discharge: 2020-12-13 | Disposition: A | Discharge status: Home / Self Care | Payer: No Typology Code available for payment source | Attending: Surgery | Admitting: Surgery

## 2020-12-13 ENCOUNTER — Other Ambulatory Visit: Payer: Self-pay

## 2020-12-13 ENCOUNTER — Ambulatory Visit: Payer: No Typology Code available for payment source

## 2020-12-13 ENCOUNTER — Encounter: Payer: Self-pay | Admitting: Surgery

## 2020-12-13 DIAGNOSIS — M1712 Unilateral primary osteoarthritis, left knee: Secondary | ICD-10-CM | POA: Diagnosis not present

## 2020-12-13 DIAGNOSIS — Z96652 Presence of left artificial knee joint: Secondary | ICD-10-CM

## 2020-12-13 HISTORY — PX: TOTAL KNEE ARTHROPLASTY: SHX125

## 2020-12-13 HISTORY — DX: Type 2 diabetes mellitus without complications: E11.9

## 2020-12-13 HISTORY — DX: Long term (current) use of antithrombotics/antiplatelets: Z79.02

## 2020-12-13 HISTORY — DX: Unspecified osteoarthritis, unspecified site: M19.90

## 2020-12-13 HISTORY — DX: Atherosclerosis of aorta: I70.0

## 2020-12-13 LAB — GLUCOSE, CAPILLARY
Glucose-Capillary: 245 mg/dL — ABNORMAL HIGH (ref 70–99)
Glucose-Capillary: 230 mg/dL — ABNORMAL HIGH (ref 70–99)

## 2020-12-13 LAB — ABO/RH: ABO/RH(D): A POS

## 2020-12-13 SURGERY — ARTHROPLASTY, KNEE, TOTAL
Anesthesia: Spinal | Site: Knee | Laterality: Left

## 2020-12-13 MED ORDER — OXYCODONE HCL 5 MG PO TABS: 5.0000 mg | ORAL_TABLET | Freq: Once | ORAL | Status: AC | PRN

## 2020-12-13 MED ORDER — SODIUM CHLORIDE 0.9 % IV SOLN: INTRAVENOUS | Status: DC

## 2020-12-13 MED ORDER — ONDANSETRON HCL 4 MG PO TABS: 4.0000 mg | ORAL_TABLET | Freq: Four times a day (QID) | ORAL | Status: DC | PRN

## 2020-12-13 MED ORDER — DEXMEDETOMIDINE HCL IN NACL 200 MCG/50ML IV SOLN
INTRAVENOUS | Status: DC | PRN
  Administered 2020-12-13 (×2): 4 ug via INTRAVENOUS
  Administered 2020-12-13: 8 ug via INTRAVENOUS
  Administered 2020-12-13: 4 ug via INTRAVENOUS

## 2020-12-13 MED ORDER — SODIUM CHLORIDE 0.9 % IR SOLN
Status: DC | PRN
  Administered 2020-12-13: 3000 mL

## 2020-12-13 MED ORDER — BUPIVACAINE HCL (PF) 0.5 % IJ SOLN
INTRAMUSCULAR | Status: DC | PRN
  Administered 2020-12-13: 2.5 mL via INTRATHECAL

## 2020-12-13 MED ORDER — OXYCODONE HCL 5 MG PO TABS
ORAL_TABLET | ORAL | 0 refills | Status: DC
  Filled 2020-12-13: qty 50, 5d supply, fill #0

## 2020-12-13 MED ORDER — MIDAZOLAM HCL 5 MG/5ML IJ SOLN
INTRAMUSCULAR | Status: DC | PRN
  Administered 2020-12-13: 2 mg via INTRAVENOUS

## 2020-12-13 MED ORDER — ORAL CARE MOUTH RINSE: 15.0000 mL | Freq: Once | OROMUCOSAL | Status: AC

## 2020-12-13 MED ORDER — OXYCODONE HCL 5 MG/5ML PO SOLN: 5.0000 mg | Freq: Once | ORAL | Status: AC | PRN

## 2020-12-13 MED ORDER — ACETAMINOPHEN 10 MG/ML IV SOLN: 1000.0000 mg | Freq: Once | INTRAVENOUS | Status: DC | PRN

## 2020-12-13 MED ORDER — FENTANYL CITRATE (PF) 100 MCG/2ML IJ SOLN
INTRAMUSCULAR | Status: DC | PRN
  Administered 2020-12-13 (×4): 25 ug via INTRAVENOUS

## 2020-12-13 MED ORDER — 0.9 % SODIUM CHLORIDE (POUR BTL) OPTIME
TOPICAL | Status: DC | PRN
  Administered 2020-12-13: 500 mL

## 2020-12-13 MED ORDER — BUPIVACAINE LIPOSOME 1.3 % IJ SUSP
INTRAMUSCULAR | Status: AC
  Filled 2020-12-13: qty 20

## 2020-12-13 MED ORDER — ACETAMINOPHEN 500 MG PO TABS: 1000.0000 mg | ORAL_TABLET | Freq: Four times a day (QID) | ORAL | Status: DC

## 2020-12-13 MED ORDER — CEFAZOLIN SODIUM-DEXTROSE 2-4 GM/100ML-% IV SOLN: 2.0000 g | Freq: Four times a day (QID) | INTRAVENOUS | Status: DC

## 2020-12-13 MED ORDER — MIDAZOLAM HCL 2 MG/2ML IJ SOLN
INTRAMUSCULAR | Status: AC
  Filled 2020-12-13: qty 2

## 2020-12-13 MED ORDER — ACETAMINOPHEN 10 MG/ML IV SOLN
INTRAVENOUS | Status: DC | PRN
  Administered 2020-12-13: 1000 mg via INTRAVENOUS

## 2020-12-13 MED ORDER — CEFAZOLIN SODIUM-DEXTROSE 2-4 GM/100ML-% IV SOLN
INTRAVENOUS | Status: AC
  Administered 2020-12-13: 2 g via INTRAVENOUS
  Filled 2020-12-13: qty 100

## 2020-12-13 MED ORDER — DEXMEDETOMIDINE HCL IN NACL 200 MCG/50ML IV SOLN
INTRAVENOUS | Status: AC
  Filled 2020-12-13: qty 50

## 2020-12-13 MED ORDER — SODIUM CHLORIDE 0.9 % IV BOLUS
250.0000 mL | Freq: Once | INTRAVENOUS | Status: AC
  Administered 2020-12-13: 250 mL via INTRAVENOUS

## 2020-12-13 MED ORDER — OXYCODONE HCL 5 MG PO TABS: 5.0000 mg | ORAL_TABLET | ORAL | 0 refills | Status: DC | PRN

## 2020-12-13 MED ORDER — PROPOFOL 1000 MG/100ML IV EMUL
INTRAVENOUS | Status: AC
  Filled 2020-12-13: qty 100

## 2020-12-13 MED ORDER — PROPOFOL 10 MG/ML IV BOLUS
INTRAVENOUS | Status: AC
  Filled 2020-12-13: qty 60

## 2020-12-13 MED ORDER — ONDANSETRON HCL 4 MG/2ML IJ SOLN
INTRAMUSCULAR | Status: DC | PRN
  Administered 2020-12-13: 4 mg via INTRAVENOUS

## 2020-12-13 MED ORDER — KETOROLAC TROMETHAMINE 15 MG/ML IJ SOLN: 15.0000 mg | Freq: Four times a day (QID) | INTRAMUSCULAR | Status: DC

## 2020-12-13 MED ORDER — FENTANYL CITRATE (PF) 100 MCG/2ML IJ SOLN
INTRAMUSCULAR | Status: AC
  Filled 2020-12-13: qty 2

## 2020-12-13 MED ORDER — OXYCODONE HCL 5 MG PO TABS: 5.0000 mg | ORAL_TABLET | ORAL | Status: DC | PRN

## 2020-12-13 MED ORDER — KETOROLAC TROMETHAMINE 30 MG/ML IJ SOLN: 30.0000 mg | Freq: Once | INTRAMUSCULAR | Status: AC

## 2020-12-13 MED ORDER — PROPOFOL 500 MG/50ML IV EMUL
INTRAVENOUS | Status: DC | PRN
  Administered 2020-12-13: 125 ug/kg/min via INTRAVENOUS

## 2020-12-13 MED ORDER — FENTANYL CITRATE (PF) 100 MCG/2ML IJ SOLN: 25.0000 ug | INTRAMUSCULAR | Status: DC | PRN

## 2020-12-13 MED ORDER — BUPIVACAINE-EPINEPHRINE (PF) 0.5% -1:200000 IJ SOLN
INTRAMUSCULAR | Status: AC
  Filled 2020-12-13: qty 30

## 2020-12-13 MED ORDER — TRANEXAMIC ACID 1000 MG/10ML IV SOLN
INTRAVENOUS | Status: AC
  Filled 2020-12-13: qty 10

## 2020-12-13 MED ORDER — BUPIVACAINE-EPINEPHRINE (PF) 0.5% -1:200000 IJ SOLN
INTRAMUSCULAR | Status: DC | PRN
  Administered 2020-12-13: 30 mL via PERINEURAL

## 2020-12-13 MED ORDER — SODIUM CHLORIDE FLUSH 0.9 % IV SOLN
INTRAVENOUS | Status: AC
  Filled 2020-12-13: qty 40

## 2020-12-13 MED ORDER — CHLORHEXIDINE GLUCONATE 0.12 % MT SOLN
OROMUCOSAL | Status: AC
  Filled 2020-12-13: qty 15

## 2020-12-13 MED ORDER — TRANEXAMIC ACID 1000 MG/10ML IV SOLN
INTRAVENOUS | Status: DC | PRN
  Administered 2020-12-13: 1000 mg via TOPICAL

## 2020-12-13 MED ORDER — APIXABAN 2.5 MG PO TABS
2.5000 mg | ORAL_TABLET | Freq: Two times a day (BID) | ORAL | 0 refills | Status: DC
  Filled 2020-12-13: qty 30, 15d supply, fill #0

## 2020-12-13 MED ORDER — ONDANSETRON HCL 4 MG/2ML IJ SOLN: 4.0000 mg | Freq: Once | INTRAMUSCULAR | Status: DC | PRN

## 2020-12-13 MED ORDER — CEFAZOLIN SODIUM-DEXTROSE 2-4 GM/100ML-% IV SOLN
2.0000 g | INTRAVENOUS | Status: AC
  Administered 2020-12-13: 2 g via INTRAVENOUS

## 2020-12-13 MED ORDER — ONDANSETRON HCL 4 MG/2ML IJ SOLN: 4.0000 mg | Freq: Four times a day (QID) | INTRAMUSCULAR | Status: DC | PRN

## 2020-12-13 MED ORDER — CHLORHEXIDINE GLUCONATE 0.12 % MT SOLN
15.0000 mL | Freq: Once | OROMUCOSAL | Status: AC
  Administered 2020-12-13: 15 mL via OROMUCOSAL

## 2020-12-13 MED ORDER — PROPOFOL 10 MG/ML IV BOLUS
INTRAVENOUS | Status: DC | PRN
  Administered 2020-12-13: 50 mg via INTRAVENOUS

## 2020-12-13 MED ORDER — SODIUM CHLORIDE 0.9 % IV SOLN
INTRAVENOUS | Status: DC | PRN
  Administered 2020-12-13: 60 mL

## 2020-12-13 MED ORDER — CEFAZOLIN SODIUM-DEXTROSE 2-4 GM/100ML-% IV SOLN
INTRAVENOUS | Status: AC
  Filled 2020-12-13: qty 100

## 2020-12-13 MED ORDER — GLYCOPYRROLATE 0.2 MG/ML IJ SOLN
INTRAMUSCULAR | Status: DC | PRN
  Administered 2020-12-13: .2 mg via INTRAVENOUS

## 2020-12-13 MED ORDER — ACETAMINOPHEN 10 MG/ML IV SOLN
INTRAVENOUS | Status: AC
  Filled 2020-12-13: qty 100

## 2020-12-13 MED ORDER — KETOROLAC TROMETHAMINE 30 MG/ML IJ SOLN
INTRAMUSCULAR | Status: AC
  Administered 2020-12-13: 30 mg via INTRAVENOUS
  Filled 2020-12-13: qty 1

## 2020-12-13 MED ORDER — OXYCODONE HCL 5 MG PO TABS
ORAL_TABLET | ORAL | Status: AC
  Administered 2020-12-13: 5 mg via ORAL
  Filled 2020-12-13: qty 1

## 2020-12-13 MED ORDER — METOCLOPRAMIDE HCL 10 MG PO TABS: 5.0000 mg | ORAL_TABLET | Freq: Three times a day (TID) | ORAL | Status: DC | PRN

## 2020-12-13 MED ORDER — METOCLOPRAMIDE HCL 5 MG/ML IJ SOLN: 5.0000 mg | Freq: Three times a day (TID) | INTRAMUSCULAR | Status: DC | PRN

## 2020-12-13 SURGICAL SUPPLY — 66 items
APL PRP STRL LF DISP 70% ISPRP (MISCELLANEOUS) ×2
BIT DRILL QUICK REL 1/8 2PK SL (DRILL) ×3 IMPLANT
BLADE SAW SAG 25X90X1.19 (BLADE) ×2 IMPLANT
BLADE SURG SZ20 CARB STEEL (BLADE) ×2 IMPLANT
BNDG CMPR STD VLCR NS LF 5.8X6 (GAUZE/BANDAGES/DRESSINGS) ×1
BNDG ELASTIC 6X5.8 VLCR NS LF (GAUZE/BANDAGES/DRESSINGS) ×2 IMPLANT
BRNG TIB 75X12 STRL LF KN (Insert) ×1 IMPLANT
CEMENT BONE R 1X40 (Cement) ×4 IMPLANT
CEMENT VACUUM MIXING SYSTEM (MISCELLANEOUS) ×2 IMPLANT
CHLORAPREP W/TINT 26 (MISCELLANEOUS) ×4 IMPLANT
COMP FEMORAL CRUC LEFT 67.5MM (Joint) ×2 IMPLANT
COMPONENT FEMRL CRUC LT 67.5MM (Joint) ×1 IMPLANT
COMPONENT PATELLAR VGD 7.8X3 (Joint) ×2 IMPLANT
COOLER POLAR GLACIER W/PUMP (MISCELLANEOUS) ×2 IMPLANT
COVER MAYO STAND REUSABLE (DRAPES) ×2 IMPLANT
CUFF TOURN SGL QUICK 24 (TOURNIQUET CUFF)
CUFF TOURN SGL QUICK 34 (TOURNIQUET CUFF)
CUFF TRNQT CYL 24X4X16.5-23 (TOURNIQUET CUFF) IMPLANT
CUFF TRNQT CYL 34X4.125X (TOURNIQUET CUFF) IMPLANT
DRAPE 3/4 80X56 (DRAPES) ×2 IMPLANT
DRAPE IMP U-DRAPE 54X76 (DRAPES) ×2 IMPLANT
DRAPE INCISE 23X17 IOBAN STRL (DRAPES) ×1
DRAPE INCISE IOBAN 23X17 STRL (DRAPES) ×1 IMPLANT
DRAPE U-SHAPE 47X51 STRL (DRAPES) ×2 IMPLANT
DRILL QUICK RELEASE 1/8 INCH (DRILL) ×3
DRSG MEPILEX SACRM 8.7X9.8 (GAUZE/BANDAGES/DRESSINGS) ×2 IMPLANT
DRSG OPSITE POSTOP 4X10 (GAUZE/BANDAGES/DRESSINGS) IMPLANT
DRSG OPSITE POSTOP 4X8 (GAUZE/BANDAGES/DRESSINGS) ×2 IMPLANT
ELECT REM PT RETURN 9FT ADLT (ELECTROSURGICAL) ×2
ELECTRODE REM PT RTRN 9FT ADLT (ELECTROSURGICAL) ×1 IMPLANT
GAUZE XEROFORM 1X8 LF (GAUZE/BANDAGES/DRESSINGS) ×2 IMPLANT
GLOVE SRG 8 PF TXTR STRL LF DI (GLOVE) ×1 IMPLANT
GLOVE SURG ENC MOIS LTX SZ7.5 (GLOVE) ×8 IMPLANT
GLOVE SURG ENC MOIS LTX SZ8 (GLOVE) ×8 IMPLANT
GLOVE SURG SYN 6.5 ES PF (GLOVE) ×2 IMPLANT
GLOVE SURG SYN 8.0 (GLOVE) ×2 IMPLANT
GLOVE SURG UNDER LTX SZ8 (GLOVE) ×2 IMPLANT
GLOVE SURG UNDER POLY LF SZ8 (GLOVE) ×2
GOWN STRL REUS W/ TWL LRG LVL3 (GOWN DISPOSABLE) ×2 IMPLANT
GOWN STRL REUS W/ TWL XL LVL3 (GOWN DISPOSABLE) ×2 IMPLANT
GOWN STRL REUS W/TWL LRG LVL3 (GOWN DISPOSABLE) ×4
GOWN STRL REUS W/TWL XL LVL3 (GOWN DISPOSABLE) ×4
INSERT TIB BEARING 75X12 (Insert) ×2 IMPLANT
IV NS IRRIG 3000ML ARTHROMATIC (IV SOLUTION) ×2 IMPLANT
KIT TURNOVER KIT A (KITS) ×2 IMPLANT
MANIFOLD NEPTUNE II (INSTRUMENTS) ×2 IMPLANT
NEEDLE SPNL 20GX3.5 QUINCKE YW (NEEDLE) ×2 IMPLANT
NS IRRIG 1000ML POUR BTL (IV SOLUTION) ×2 IMPLANT
PACK TOTAL KNEE (MISCELLANEOUS) ×2 IMPLANT
PAD WRAPON POLAR KNEE (MISCELLANEOUS) ×1 IMPLANT
PLATE KNEE TIBIAL 75MM FIXED (Plate) ×2 IMPLANT
PULSAVAC PLUS IRRIG FAN TIP (DISPOSABLE) ×2
SPONGE T-LAP 18X18 ~~LOC~~+RFID (SPONGE) ×6 IMPLANT
STAPLER SKIN PROX 35W (STAPLE) ×2 IMPLANT
SUCTION FRAZIER HANDLE 10FR (MISCELLANEOUS) ×1
SUCTION TUBE FRAZIER 10FR DISP (MISCELLANEOUS) ×1 IMPLANT
SUT VIC AB 0 CT1 36 (SUTURE) ×6 IMPLANT
SUT VIC AB 2-0 CT1 27 (SUTURE) ×6
SUT VIC AB 2-0 CT1 TAPERPNT 27 (SUTURE) ×3 IMPLANT
SYR 10ML LL (SYRINGE) ×2 IMPLANT
SYR 20ML LL LF (SYRINGE) ×2 IMPLANT
SYR 30ML LL (SYRINGE) IMPLANT
TIP FAN IRRIG PULSAVAC PLUS (DISPOSABLE) ×1 IMPLANT
TRAP FLUID SMOKE EVACUATOR (MISCELLANEOUS) ×2 IMPLANT
WATER STERILE IRR 500ML POUR (IV SOLUTION) ×2 IMPLANT
WRAPON POLAR PAD KNEE (MISCELLANEOUS) ×2

## 2020-12-13 NOTE — Op Note (Signed)
12/13/2020  9:58 AM  Patient:   Curtis Singh  Pre-Op Diagnosis:   Degenerative joint disease, left knee.  Post-Op Diagnosis:   Same  Procedure:   Left TKA using all-cemented [employer] Vanguard system with a 67.5 mm mm PCR femur, a 75 mm tibial tray with a 12 mm anterior stabilized E-poly insert, and a 34 x 7.8 mm all-poly 3-pegged domed patella.  Surgeon:   Pascal Lux, MD  Assistant:   Cameron Proud, PA-C   Anesthesia:   Spinal  Findings:   As above  Complications:   None  EBL:   10 cc  Fluids:   1100 cc crystalloid  UOP:   None  TT:   90 minutes at 300 mmHg  Drains:   None  Closure:   Staples  Implants:   As above  Brief Clinical Note:   The patient is a 65 year old male with a long history of progressively worsening left knee pain. The patient's symptoms have progressed despite medications, activity modification, injections, etc. The patient's history and examination were consistent with advanced degenerative joint disease of the left knee confirmed by plain radiographs. The patient presents at this time for a left total knee arthroplasty.  Procedure:   The patient was brought into the operating room. After adequate spinal anesthesia was obtained, the patient was lain in the supine position before the left lower extremity was prepped with ChloraPrep solution and draped sterilely. Preoperative antibiotics were administered. After verifying the proper laterality with a surgical timeout, the limb was exsanguinated with an Esmarch and the tourniquet inflated to 300 mmHg.   A standard anterior approach to the knee was made through an approximately 6-7 inch incision. The incision was carried down through the subcutaneous tissues to expose superficial retinaculum. This was split the length of the incision and the medial flap elevated sufficiently to expose the medial retinaculum. The medial retinaculum was incised, leaving a 3-4 mm cuff of tissue on the patella. This was extended  distally along the medial border of the patellar tendon and proximally through the medial third of the quadriceps tendon. A subtotal fat pad excision was performed before the soft tissues were elevated off the anteromedial and anterolateral aspects of the proximal tibia to the level of the collateral ligaments. The anterior portions of the medial and lateral menisci were removed, as was the anterior cruciate ligament. With the knee flexed to 90, the external tibial guide was positioned and the appropriate proximal tibial cut made. This piece was taken to the back table where it was measured and found to be optimally replicated by a 75 mm component.  Attention was directed to the distal femur. The intramedullary canal was accessed through a 3/8" drill hole. The intramedullary guide was inserted and positioned in order to obtain a neutral flexion gap. The intercondylar block was positioned with care taken to avoid notching the anterior cortex of the femur. The appropriate cut was made. Next, the distal cutting block was placed at 5 of valgus alignment. Using the 9 mm slot, the distal cut was made. The distal femur was measured and found to be optimally replicated by the 16.1 mm component. The 67.5 mm 4-in-1 cutting block was positioned and first the posterior, then the posterior chamfer, the anterior chamfer, and finally the anterior cuts were made. At this point, the posterior portions medial and lateral menisci were removed. A trial reduction was performed using the appropriate femoral and tibial components with the 12 mm insert. This demonstrated  excellent stability to varus and valgus stressing both in flexion and extension while permitting full extension. Patella tracking was assessed and found to be excellent. Therefore, the tibial guide position was marked on the proximal tibia. The patella thickness was measured and found to be 20 mm. Therefore, the appropriate cut was made. The patellar surface was  measured and found to be optimally replicated by the 34 mm component. The three peg holes were drilled in place before the trial button was inserted. Patella tracking was assessed and found to be excellent, passing the "no thumb test". The lug holes were drilled into the distal femur before the trial component was removed, leaving only the tibial tray. The keel was then created using the appropriate tower, reamer, and punch.  The bony surfaces were prepared for cementing by irrigating them thoroughly with sterile saline solution via the jet lavage system. A bone plug was fashioned from some of the bone that had been removed previously and used to plug the distal femoral canal. In addition, 20 cc of Exparel diluted out to 60 cc with normal saline and 30 cc of 0.5% Sensorcaine were injected into the postero-medial and postero-lateral aspects of the knee, the medial and lateral gutter regions, and the peri-incisional tissues to help with postoperative analgesia. Meanwhile, the cement was being mixed on the back table. When it was ready, the tibial tray was cemented in first. The excess cement was removed using [employer] fast streamer. Next, the femoral component was impacted into place. Again, the excess cement was removed using [employer] fast streamer. The 12 mm trial insert was positioned and the knee brought into extension while the cement hardened. Finally, the patella was cemented into place and secured using the patellar clamp. Again, the excess cement was removed using [employer] fast streamer. Once the cement had hardened, the knee was placed through a range of motion with the findings as described above. Therefore, the trial insert was removed and, after verifying that no cement had been retained posteriorly, the permanent 12 mm anterior stabilized E-polyethylene insert was positioned and secured using the appropriate key locking mechanism. Again the knee was placed through a range of motion with the findings as described  above.  The wound was copiously irrigated with sterile saline solution using the jet lavage system before the quadriceps tendon and retinacular layer were reapproximated using #0 Vicryl interrupted sutures. The superficial retinacular layer also was closed using a running #0 Vicryl suture. A total of 10 cc of transexemic acid (TXA) was injected intra-articularly before the subcutaneous tissues were closed in several layers using 2-0 Vicryl interrupted sutures. The skin was closed using staples. A sterile honeycomb dressing was applied to the skin before the leg was wrapped with an Ace wrap to accommodate the Polar Care device. The patient was then awakened and returned to the recovery room in satisfactory condition after tolerating the procedure well.

## 2020-12-13 NOTE — Anesthesia Procedure Notes (Signed)
Spinal  Patient location during procedure: OR Start time: 12/13/2020 7:45 AM End time: 12/13/2020 7:51 AM Reason for block: surgical anesthesia Staffing Performed: anesthesiologist  Anesthesiologist: [name], MD Preanesthetic Checklist Completed: patient identified, IV checked, site marked, risks and benefits discussed, surgical consent, monitors and equipment checked, pre-op evaluation and timeout performed Spinal Block Patient position: sitting Prep: ChloraPrep Patient monitoring: heart rate, continuous pulse ox, blood pressure and cardiac monitor Approach: midline Location: L3-4 Injection technique: single-shot Needle Needle type: Quincke  Needle gauge: 22 G Needle length: 9 cm Assessment Sensory level: T10 Events: CSF return Additional Notes Negative paresthesia. Negative blood return. Positive free-flowing CSF. Expiration date of kit checked and confirmed. Patient tolerated procedure well, without complications.

## 2020-12-13 NOTE — Progress Notes (Signed)
Met with the patient to discuss DC plan and needs The patient has a rolling walker and a raised toilet seat but would benefit from a 3 in, 1, Curtis Singh with adapt notified and it will be delivered to the bedside prior to DC He is set up with Outpatient PT on Monday and Thursday next week, he has transportation and can afford his medication No additional needs at this time

## 2020-12-13 NOTE — Anesthesia Preprocedure Evaluation (Signed)
Anesthesia Evaluation  Patient identified by MRN, date of birth, ID band Patient awake    Reviewed: Allergy & Precautions, NPO status , Patient's Chart, lab work & pertinent test results  History of Anesthesia Complications Negative for: history of anesthetic complications  Airway Mallampati: III  TM Distance: >3 FB Neck ROM: Full    Dental no notable dental hx. (+) Teeth Intact   Pulmonary neg pulmonary ROS, neg sleep apnea, neg COPD, Patient abstained from smoking.Not current smoker, former smoker,    Pulmonary exam normal breath sounds clear to auscultation       Cardiovascular Exercise Tolerance: Good METShypertension, + CAD and + Cardiac Stents  (-) Past MI (-) dysrhythmias  Rhythm:Regular Rate:Normal - Systolic murmurs Stents placed 1 year ago. Last Brillinta dose last Thursday evening (6.5 days ago)   Neuro/Psych negative neurological ROS  negative psych ROS   GI/Hepatic GERD  ,(+)     (-) substance abuse  ,   Endo/Other  diabetes, Poorly Controlled, Oral Hypoglycemic Agents  Renal/GU negative Renal ROS     Musculoskeletal  (+) Arthritis ,   Abdominal   Peds  Hematology   Anesthesia Other Findings Past Medical History: No date: Aortic atherosclerosis ([place]) 12/2019: Coronary artery disease     Comment:  a.) LHC 12/16/2019: EF 55-65%, LVEDP norm. 99% p-mRCA,               20% mRCA. --> PCI placing a 3.0 x 22 mm Resolute Onyx DES              to pRCA. 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 No date: HLD (hyperlipidemia) No date: Hypertension No date: Long term current use of antithrombotics/antiplatelets     Comment:  a.) DAPT (ASA + ticagrelor) No date: Osteoarthritis No date: Seasonal allergies No date: T2DM (type 2 diabetes mellitus) ([place])  Reproductive/Obstetrics                              Anesthesia Physical Anesthesia Plan  ASA: 3  Anesthesia Plan: Spinal   Post-op Pain Management: Ofirmev IV (intra-op) and Dilaudid IV   Induction: Intravenous  PONV Risk Score and Plan: 2 and Ondansetron, Dexamethasone, Propofol infusion, TIVA and Midazolam  Airway Management Planned: Natural Airway  Additional Equipment: None  Intra-op Plan:   Post-operative Plan:   Informed Consent: I have reviewed the patients History and Physical, chart, labs and discussed the procedure including the risks, benefits and alternatives for the proposed anesthesia with the patient or authorized representative who has indicated his/her understanding and acceptance.       Plan Discussed with: CRNA and Surgeon  Anesthesia Plan Comments: (ASRA guidelines note 5-7days of stopping brillinta before neuraxial. Patient at 6.5 days with normal kidney and liver function, should be within guidelines to perform spinal. Discussed R/B/A of neuraxial anesthesia technique with patient: - rare risks of spinal/epidural hematoma, nerve damage, infection - Risk of PDPH - Risk of nausea and vomiting - Risk of conversion to general anesthesia and its associated risks, including sore throat, damage to lips/teeth/oropharynx, and rare risks such as cardiac and respiratory events. - Risk of allergic reactions  Discussed the role of CRNA in patient's perioperative care.  Patient voiced understanding.)        Anesthesia Quick Evaluation

## 2020-12-13 NOTE — H&P (Signed)
History of Present Illness: Curtis Singh is a 65 y.o. who presents today for history and physical. He is to undergo a left total knee arthroplasty on 12/13/2020. Last seen in the clinic on 08/22/2020. There is been no change in his condition since that time.  The symptoms began about 5 years ago and developed without any specific cause or injury. He reports 6/10 pain. The pain is located along the medial aspect of the knee. The pain is described as aching, stabbing and throbbing. The symptoms are aggravated using stairs, at higher levels of activity, rising from a chair, walking, standing and standing pivot. He also describes no mechanical symptoms. He has no associated swelling and no deformity. He has tried acetaminophen and bracing with limited benefit. Patient had MRI of the left knee performed which showed meniscus pathology as well as moderate degenerative changes to the patellofemoral articulation as well as the medial compartment. Patient states his pain at this to the point he wishes to proceed with having total knee arthroplasty.  Past Medical History  Diabetes mellitus without complication (CMS-HCC)   GERD (gastroesophageal reflux disease)   Hematologic abnormality Had stent put in Dec. 21   Hyperlipidemia   Hypertension   Primary osteoarthritis of left knee 06/06/2020   Past Surgical History:  VASECTOMY   Past Family History: History reviewed. No pertinent family history.  Medications:  aspirin 81 MG EC tablet Take 81 mg by mouth once daily   blood glucose diagnostic (FREESTYLE LITE STRIPS) test strip   blood glucose meter kit   dexlansoprazole (DEXILANT) 60 mg DR capsule Take by mouth   hydroCHLOROthiazide (MICROZIDE) 12.5 mg capsule   ibuprofen (MOTRIN) 200 MG tablet Take by mouth   insulin DETEMIR (LEVEMIR) injection (concentration 100 units/mL) Inject 100 Units subcutaneously nightly Inject 12 units daily as directed   insulin GLARGINE (LANTUS) injection (concentration 100  units/mL) Inject subcutaneously   losartan (COZAAR) 25 MG tablet Take 1 tablet by mouth once daily   metFORMIN (GLUCOPHAGE) 500 MG tablet Take 500 mg by mouth 2 (two) times daily with meals   metoprolol succinate (TOPROL-XL) 50 MG XL tablet Take by mouth   multivitamin capsule Take 1 capsule by mouth once daily   multivitamin with minerals tablet Take by mouth   neomycin-polymyxin-dexAMETHasone (MAXITROL) ophthalmic suspension Place one drop 4 times per day in the surgical eye for 2 weeks then stop 5 mL 0   omeprazole (PRILOSEC) 20 MG DR capsule   pen needle, diabetic 29 gauge x 1/2" needle   rosuvastatin (CRESTOR) 40 MG tablet Take by mouth   semaglutide (OZEMPIC) 0.25 mg or 0.5 mg(2 mg/1.5 mL) pen injector Inject subcutaneously   sildenafiL (VIAGRA) 25 MG tablet Take by mouth at bedtime as needed   ticagrelor (BRILINTA) 60 mg tablet Take by mouth   zolpidem (AMBIEN) 10 mg tablet Take by mouth   Allergies:  Latex, Natural Rubber Rash   Review of Systems: A comprehensive 14 point ROS was performed, reviewed, and the pertinent orthopaedic findings are documented in the HPI.  Physical Exam: BP 136/84 (BP Location: Left upper arm, Patient Position: Sitting, BP Cuff Size: Adult)  Ht 177.8 cm ('5\' 10"' )  Wt 90.7 kg (200 lb)  BMI 28.70 kg/m   General: Well-developed well-nourished male seen in no acute distress.   HEENT: Atraumatic,normocephalic. Pupils are equal and reactive to light. Oropharynx is clear with moist mucosa  Lungs: Clear to auscultation bilaterally   Cardiovascular: Regular rate and rhythm. Normal S1, S2. No  murmurs. No appreciable gallops or rubs. Peripheral pulses are palpable.  Abdomen: Soft, non-tender, nondistended. Bowel sounds present  Left knee exam: GAIT: normal and uses no assistive devices. ALIGNMENT: normal SKIN: unremarkable SWELLING: minimal EFFUSION: trace WARMTH: no warmth TENDERNESS: mild over the medial joint line ROM: 0 to 130 degrees with  mild pain in maximal flexion McMURRAY'S: equivocal PATELLOFEMORAL: normal tracking with no peri-patellar tenderness and negative apprehension sign CREPITUS: no LACHMAN'S: negative PIVOT SHIFT: negative ANTERIOR DRAWER: negative POSTERIOR DRAWER: negative VARUS/VALGUS: stable  Neurological: The patient is alert and oriented Sensation to light touch appears to be intact and within normal limits Gross motor strength appeared to be equal to 5/5  Vascular : Peripheral pulses felt to be palpable. Capillary refill appears to be intact and within normal limits  X-ray: X-rays taken in the clinic on 06/06/2020 showed mild degenerative changes, primarily involving the patellofemoral compartment with 10% medial patellofemoral joint space narrowing. Overall alignment is neutral. No fractures, lytic lesions, or abnormal calcifications are noted.  Impression: 1. Degenerative joint disease, left knee.  Plan:  The treatment options, including both surgical and nonsurgical choices, have been discussed in detail with the patient. He would like to proceed with a left total knee arthroplasty. The risks (including bleeding, infection, nerve and/or blood vessel injury, persistent or recurrent pain, loosening or failure of the components, dislocation, need for further surgery, blood clots, strokes, heart attacks or arrhythmias, pneumonia, etc.) and benefits of the surgical procedure were discussed. The patient states his understanding and agrees to proceed. A formal written consent will be obtained by the nursing staff.   H&P reviewed and patient re-examined. No changes.

## 2020-12-13 NOTE — Discharge Instructions (Addendum)
Orthopedic discharge instructions: May shower with intact OpSite dressing. Apply ice frequently to knee or use Polar Care device. Start Eliquis 2.5 mg twice daily for 2 weeks on Friday, 12/14/2020, then take aspirin 325 mg daily for 4 weeks. Take pain medication as prescribed or ES Tylenol when needed.  May weight-bear as tolerated on left leg - use walker as needed. Start PT on Monday as scheduled. Follow-up in 10-14 days or as scheduled.  AMBULATORY SURGERY  DISCHARGE INSTRUCTIONS   The drugs that you were given will stay in your system until tomorrow so for the next 24 hours you should not:  Drive an automobile Make any legal decisions Drink any alcoholic beverage   You may resume regular meals tomorrow.  Today it is better to start with liquids and gradually work up to solid foods.  You may eat anything you prefer, but it is better to start with liquids, then soup and crackers, and gradually work up to solid foods.   Please notify your doctor immediately if you have any unusual bleeding, trouble breathing, redness and pain at the surgery site, drainage, fever, or pain not relieved by medication.    Additional Instructions:  Please contact your physician with any problems or Same Day Surgery at [phone], Monday through Friday 6 am to 4 pm, or [place] at [facility] number at [phone].

## 2020-12-13 NOTE — Evaluation (Signed)
Physical Therapy Evaluation Patient Details Name: Curtis Singh MRN: [id number] DOB: Jan 05, 1956 Today's Date: 12/13/2020  History of Present Illness  Pt s/p L TKA, WBAT. PMH of smoking, HTN, CAD, cardiac stents, GERD, DMII.  Clinical Impression  Patient A&Ox4, reported pain in L knee at rest is 1/10. Pt reported at baseline he is independent, works full time, no falls and will have his significant other to assist as needed at discharge.  The patient was able to perform several exercises without physical assist, exhibited great quad activation with quad sets. Reported initially return of sensation, but after transitioning to sit EOB modI, stated he had a decreased ability to feel his foot on the floor. He did state this improved over time with mobility, but still not quite back to baseline at end of session. No L knee buckling or instability noted throughout. Sit <> stand with RW and CGA, excellent carryover of education provided for RW management and hand placement. He was able to ambulate ~163ft with RW and CGA, progressed to step through pattern with improved LE weight bearing with education and time. He ascended/descended 4 steps with bilateral rails safely with CGA, and was able to teach back proper technique.  Overall the patient demonstrated deficits (see "PT Problem List") that impede the patient's functional abilities, safety, and mobility and would benefit from skilled PT intervention. Recommendation is outpatient PT to maximize function and return to PLOF.        Recommendations for follow up therapy are one component of a multi-disciplinary discharge planning process, led by the attending physician.  Recommendations may be updated based on patient status, additional functional criteria and insurance authorization.  Follow Up Recommendations Outpatient PT    Assistance Recommended at Discharge Intermittent Supervision/Assistance  Functional Status Assessment Patient has had a recent  decline in their functional status and demonstrates the ability to make significant improvements in function in a reasonable and predictable amount of time.  Equipment Recommendations  BSC/3in1    Recommendations for Other Services       Precautions / Restrictions Precautions Precautions: Fall;Knee Precaution Booklet Issued: Yes (comment) Restrictions Weight Bearing Restrictions: Yes LLE Weight Bearing: Weight bearing as tolerated      Mobility  Bed Mobility Overal bed mobility: Modified Independent                  Transfers Overall transfer level: Needs assistance Equipment used: Rolling walker (2 wheels) Transfers: Sit to/from Stand Sit to Stand: Supervision;Min guard           General transfer comment: cued hand placement, RW guidance, good carryover once initial education completed    Ambulation/Gait Ambulation/Gait assistance: Min guard Gait Distance (Feet): 100 Feet Assistive device: Rolling walker (2 wheels)   Gait velocity: decreased     General Gait Details: exhibited step to gait pattern naturally, educated on step through and RW use to maximize weight bearing through LEs  Stairs Stairs: Yes Stairs assistance: Min guard Stair Management: Two rails;Step to pattern Number of Stairs: 4 General stair comments: steady, safe, verbalized and demonstrated safe technique  Wheelchair Mobility    Modified Rankin (Stroke Patients Only)       Balance Overall balance assessment: Needs assistance Sitting-balance support: Feet supported Sitting balance-Leahy Scale: Good       Standing balance-Leahy Scale: Fair Standing balance comment: able to void at standard commode in standing and wash hands with intermittently no UE or single UE support, no LOB noted  Pertinent Vitals/Pain Pain Assessment: 0-10 Pain Score: 3  Pain Location: at end of session/mobility Pain Descriptors / Indicators: Sore Pain  Intervention(s): Limited activity within patient's tolerance;Monitored during session;Repositioned;Patient requesting pain meds-RN notified    Home Living Family/patient expects to be discharged to:: Private residence Living Arrangements: Spouse/significant other Available Help at Discharge: Family Type of Home: House Home Access: Stairs to enter Entrance Stairs-Rails: Left;Right;Can reach both Technical brewer of Steps: 5   Home Layout: One level Home Equipment: Conservation officer, nature (2 wheels);Toilet riser Additional Comments: works full time, no falls    Prior Function Prior Level of Function : Independent/Modified Independent                     Hand Dominance        Extremity/Trunk Assessment   Upper Extremity Assessment Upper Extremity Assessment: Overall WFL for tasks assessed    Lower Extremity Assessment Lower Extremity Assessment:  (L s/p TKA, R WFLs)    Cervical / Trunk Assessment Cervical / Trunk Assessment: Normal  Communication   Communication: No difficulties  Cognition Arousal/Alertness: Awake/alert Behavior During Therapy: WFL for tasks assessed/performed Overall Cognitive Status: Within Functional Limits for tasks assessed                                          General Comments      Exercises Total Joint Exercises Ankle Circles/Pumps: AROM;Left;5 reps Quad Sets: AROM;Left;5 reps;Strengthening Heel Slides: AROM;Strengthening;Left;10 reps Long Arc Quad: AROM;Left;5 reps;Strengthening;Seated   Assessment/Plan    PT Assessment Patient needs continued PT services  PT Problem List Decreased strength;Decreased mobility;Decreased range of motion;Decreased knowledge of precautions;Decreased activity tolerance;Decreased balance;Pain;Decreased knowledge of use of DME       PT Treatment Interventions DME instruction;Therapeutic exercise;Gait training;Balance training;Neuromuscular re-education;Stair training;Functional  mobility training;Therapeutic activities;Patient/family education    PT Goals (Current goals can be found in the Care Plan section)  Acute Rehab PT Goals Patient Stated Goal: to go home PT Goal Formulation: With patient Time For Goal Achievement: 12/27/20 Potential to Achieve Goals: Good    Frequency BID   Barriers to discharge        Co-evaluation               AM-PAC PT "6 Clicks" Mobility  Outcome Measure Help needed turning from your back to your side while in a flat bed without using bedrails?: None Help needed moving from lying on your back to sitting on the side of a flat bed without using bedrails?: None Help needed moving to and from a bed to a chair (including a wheelchair)?: None Help needed standing up from a chair using your arms (e.g., wheelchair or bedside chair)?: None Help needed to walk in hospital room?: None Help needed climbing 3-5 steps with a railing? : A Little 6 Click Score: 23    End of Session Equipment Utilized During Treatment: Gait belt Activity Tolerance: Patient tolerated treatment well Patient left: in bed;with call bell/phone within reach Nurse Communication: Mobility status PT Visit Diagnosis: Other abnormalities of gait and mobility (R26.89);Difficulty in walking, not elsewhere classified (R26.2);Muscle weakness (generalized) (M62.81);Pain Pain - Right/Left: Left Pain - part of body: Knee    Time: 1319-1350 PT Time Calculation (min) (ACUTE ONLY): 31 min   Charges:   PT Evaluation $PT Eval Low Complexity: 1 Low PT Treatments $Gait Training: 8-22 mins $Therapeutic Exercise: 8-22 mins  Lieutenant Diego PT, DPT 2:05 PM,12/13/20

## 2020-12-13 NOTE — Transfer of Care (Signed)
Immediate Anesthesia Transfer of Care Note  Patient: Curtis Singh  Procedure(s) Performed: TOTAL KNEE ARTHROPLASTY (Left: Knee)  Patient Location: PACU  Anesthesia Type:Spinal  Level of Consciousness: awake, drowsy and patient cooperative  Airway & Oxygen Therapy: Patient Spontanous Breathing and Patient connected to face mask oxygen  Post-op Assessment: Report given to RN and Post -op Vital signs reviewed and stable  Post vital signs: Reviewed and stable  Last Vitals:  Vitals Value Taken Time  BP 98/59 12/13/20 1006  Temp    Pulse 64 12/13/20 1011  Resp 17 12/13/20 1011  SpO2 99 % 12/13/20 1011  Vitals shown include unvalidated device data.  Last Pain:  Vitals:   12/13/20 0627  TempSrc: Tympanic  PainSc: 2          Complications: No notable events documented.

## 2020-12-14 NOTE — Anesthesia Postprocedure Evaluation (Signed)
Anesthesia Post Note  Patient: Curtis Singh  Procedure(s) Performed: TOTAL KNEE ARTHROPLASTY (Left: Knee)  Patient location during evaluation: PACU Anesthesia Type: Spinal Level of consciousness: oriented and awake and alert Pain management: pain level controlled Vital Signs Assessment: post-procedure vital signs reviewed and stable Respiratory status: spontaneous breathing, respiratory function stable and patient connected to nasal cannula oxygen Cardiovascular status: blood pressure returned to baseline and stable Postop Assessment: no headache, no backache and no apparent nausea or vomiting Anesthetic complications: no   No notable events documented.   Last Vitals:  Vitals:   12/13/20 1223 12/13/20 1400  BP: 124/75 124/76  Pulse: 66 60  Resp: 15 16  Temp:    SpO2: 99% 97%    Last Pain:  Vitals:   12/13/20 1116  TempSrc: Temporal                 [name]

## 2020-12-17 ENCOUNTER — Other Ambulatory Visit: Payer: Self-pay

## 2020-12-17 ENCOUNTER — Ambulatory Visit: Payer: No Typology Code available for payment source | Attending: Surgery | Admitting: Physical Therapy

## 2020-12-17 DIAGNOSIS — R269 Unspecified abnormalities of gait and mobility: Secondary | ICD-10-CM | POA: Insufficient documentation

## 2020-12-17 DIAGNOSIS — M25662 Stiffness of left knee, not elsewhere classified: Secondary | ICD-10-CM | POA: Insufficient documentation

## 2020-12-17 DIAGNOSIS — M25562 Pain in left knee: Secondary | ICD-10-CM | POA: Diagnosis present

## 2020-12-17 DIAGNOSIS — M6281 Muscle weakness (generalized): Secondary | ICD-10-CM | POA: Diagnosis present

## 2020-12-17 DIAGNOSIS — Z96652 Presence of left artificial knee joint: Secondary | ICD-10-CM | POA: Insufficient documentation

## 2020-12-17 NOTE — Patient Instructions (Signed)
Access Code: [facility] URL: [email or web address] Date: 12/17/2020 Prepared by: Dorcas Carrow  Exercises Supine Heel Slide with Strap - 1 x daily - 7 x weekly - 3 sets - 10 reps Supine Quad Set - 2 x daily - 7 x weekly - 2 sets - 10 reps Ankle Pumps in Elevation - 1 x daily - 7 x weekly - 3 sets - 10 reps Seated Long Arc Quad - 2 x daily - 7 x weekly - 2 sets - 10 reps Seated Knee Flexion AAROM - 1 x daily - 7 x weekly - 3 sets - 10 reps

## 2020-12-18 ENCOUNTER — Ambulatory Visit: Payer: No Typology Code available for payment source | Admitting: Cardiovascular Disease

## 2020-12-20 ENCOUNTER — Encounter: Payer: Self-pay | Admitting: Physical Therapy

## 2020-12-20 ENCOUNTER — Other Ambulatory Visit: Payer: Self-pay

## 2020-12-20 ENCOUNTER — Ambulatory Visit: Payer: No Typology Code available for payment source | Admitting: Physical Therapy

## 2020-12-20 DIAGNOSIS — M25662 Stiffness of left knee, not elsewhere classified: Secondary | ICD-10-CM

## 2020-12-20 DIAGNOSIS — Z96652 Presence of left artificial knee joint: Secondary | ICD-10-CM | POA: Diagnosis not present

## 2020-12-20 DIAGNOSIS — M6281 Muscle weakness (generalized): Secondary | ICD-10-CM

## 2020-12-20 MED ORDER — OXYCODONE HCL 5 MG PO TABS: ORAL_TABLET | ORAL | 0 refills | Status: DC

## 2020-12-20 MED ORDER — OXYCODONE HCL 5 MG PO TABS
ORAL_TABLET | ORAL | 0 refills | Status: DC
  Filled 2020-12-20: qty 50, 5d supply, fill #0

## 2020-12-20 NOTE — Therapy (Signed)
[facility] [address] [place], [place], [address] Phone: [phone]   Fax:  [phone]  Physical Therapy Evaluation  Patient Details  Name: Curtis Singh MRN: [id number] Date of Birth: [birth year] Referring Provider (PT): Dr. Roland Rack   Encounter Date: 12/17/2020   PT End of Session - 12/20/20 1044     Visit Number 1    Number of Visits 17    Date for PT Re-Evaluation 02/11/21    Authorization - Visit Number 1    Authorization - Number of Visits 10    PT Start Time 1027    PT Stop Time 1120    PT Time Calculation (min) 53 min    Equipment Utilized During Treatment Gait belt    Activity Tolerance Patient tolerated treatment well;Patient limited by pain    Behavior During Therapy [facility] for tasks assessed/performed             Past Medical History:  Diagnosis Date   Aortic atherosclerosis ([place])    Coronary artery disease 12/2019   a.) LHC 12/16/2019: EF 55-65%, LVEDP norm. 99% p-mRCA, 20% mRCA. --> PCI placing a 3.0 x 22 mm Resolute Onyx DES to pRCA.   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    HLD (hyperlipidemia)    Hypertension    Long term current use of antithrombotics/antiplatelets    a.) DAPT (ASA + ticagrelor)   Osteoarthritis    Seasonal allergies    T2DM (type 2 diabetes mellitus) ([place])     Past Surgical History:  Procedure Laterality Date   ARTHOSCOPIC ROTAOR CUFF REPAIR Left 02/25/2018   Procedure: LEFT ARTHROSCOPIC ROTATOR CUFF REPAIR; SUBACROMIAL DECOMPRESSION;  Surgeon: Tania Ade, MD;  [facility]: [facility];  Service: Orthopedics;  Laterality: Left;   CATARACT EXTRACTION W/PHACO Right 09/03/2016   Procedure: CATARACT EXTRACTION PHACO AND INTRAOCULAR LENS PLACEMENT ([place]) RIGHT DIABETIC;  Surgeon: Leandrew Koyanagi, MD;  Location: [place];  Service: Ophthalmology;  Laterality: Right;  DIABETIC   COLONOSCOPY  WITH PROPOFOL N/A 08/29/2019   Procedure: COLONOSCOPY WITH PROPOFOL;  Surgeon: Lin Landsman, MD;  Location: [facility] ENDOSCOPY;  Service: Gastroenterology;  Laterality: N/A;   CORONARY STENT INTERVENTION N/A 12/16/2019   Procedure: CORONARY STENT INTERVENTION (3.0 x 22 mm Resolute Onyx DES to pRCA);  Surgeon: Wellington Hampshire, MD;  Location: [place] [facility];  Service: Cardiovascular;  Laterality: N/A;   LEFT HEART CATH AND CORONARY ANGIOGRAPHY N/A 12/16/2019   Procedure: LEFT HEART CATH AND CORONARY ANGIOGRAPHY;  Surgeon: Wellington Hampshire, MD;  Location: [place] [facility];  Service: Cardiovascular;  Laterality: N/A;   ROTATOR CUFF REPAIR Right 01/07/2007   TOTAL KNEE ARTHROPLASTY Left 12/13/2020   Procedure: TOTAL KNEE ARTHROPLASTY;  Surgeon: Corky Mull, MD;  Location: [facility];  Service: Orthopedics;  Laterality: Left;   VASECTOMY N/A     There were no vitals filed for this visit.    Subjective Assessment - 12/20/20 1037     Subjective Pt. s/p L TKA on 12/8 (Dr. Roland Rack).  Pt. is taking oxy every 4 hours and alternating with Tylenol.  Pt. is currently sleeping in recliner due to L knee pain/ discomfort with positioning.  Pt. reports 5/10 L knee pain currently at rest and 9/10 pain with initial standing after sitting for increase periods of time.  Pt. entered PT with use of RW and limited L hip/knee flexion during  swing through phase of gait.  Heavy UE assist and decrease L LE wt. bearing due to pain.    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 Pt. 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  Pt. known well to skilled PT services.  Pt. has good caregiver assist for daily household tasks/ driving.    Limitations Standing;Walking;House hold activities    Patient Stated Goals Increase L knee AROM/ strength to improve pain-free mobility and return to work.    Currently in Pain? Yes    Pain Score 5     Pain Location Knee    Pain Orientation Left     Pain Descriptors / Indicators Constant;Aching    Pain Type Surgical pain                [facility] PT Assessment - 12/20/20 0001       Assessment   Medical Diagnosis s/p L TKA    Referring Provider (PT) Dr. Roland Rack    Onset Date/Surgical Date 12/13/20    Next MD Visit 2 weeks    Prior Therapy Pt. known well to skilled PT services.      Prior Function   Level of Independence Independent      Cognition   Overall Cognitive Status Within Functional Limits for tasks assessed             See clinical impression  Swelling L/R: joint line (41/37.5 cm). Mid-gastroc (40.5/35 cm.), distal quad (44.5/ 40.5 cm).    Assisted pt. With donning compression stockings.     Objective measurements completed on examination: See above findings.         PT Education - 12/20/20 1043     Education Details See HEP (Access Code: [facility])    Person(s) Educated Patient    Methods Explanation;Demonstration;Handout    Comprehension Verbalized understanding;Returned demonstration              PT Short Term Goals - 06/30/18 2040       PT SHORT TERM GOAL #1   Title Pt. will increase FOTO to 65 to improve pain-free mobility with lifting/ work-related tasks.    Baseline initial FOTO on 06/01/2018:  59.   6/23: 72.    Time 4    Period Weeks    Status Achieved    Target Date 06/29/18      PT SHORT TERM GOAL #2   Title Pt. able to demonstrate proper lifting technique of 40# box with no c/o R low back pain to improve work-related tasks.     Baseline Good mechanics with UE lifting ex./ box carrying/ sled push and pull.    Time 4    Period Weeks    Status Achieved    Target Date 06/29/18      PT SHORT TERM GOAL #3   Title Pt. will report no R SI/lumbar tenderness with palpation to improve pain-free mobility.      Baseline (+) R SI tenderness.  Pt. reports no pain at rest in sitting/standing posture.     Time 4    Period Weeks    Status Partially Met    Target Date 06/29/18      PT  SHORT TERM GOAL #4   Title Pt. able to ride motorcycle with no c/o R low back pain.      Baseline Pt. able to ride bike with no limitations.  Pt. did report slight discomfort in low back with handling bike at  stops.    Time 4    Period Weeks    Status Achieved    Target Date 06/29/18               PT Long Term Goals - 12/20/20 1053       PT LONG TERM GOAL #1   Title Pt. will increase FOTO to 45 to improve pain-free moblity with daily tasks.    Baseline FOTO: initial: 12    Time 8    Period Weeks    Status New    Target Date 02/11/21      PT LONG TERM GOAL #2   Title Pt. will increase L knee AROM to 0->115 deg. to improve walking/ stairs/ return to work.    Baseline R knee AROM: -1 to 128 deg. and strength grossly 5/5 MMT. Pt. pain limited with L knee A/PROM: extensino -12/-10 deg., flexion 64/72 deg. (guarded/ pain limted).    Time 8    Period Weeks    Status New    Target Date 02/11/21      PT LONG TERM GOAL #3   Title Pt. will stand from chair with proper technique/ no UE assist to improve pain-free mobility.    Baseline 9/10 L knee pain with sit to stands    Time 8    Period Weeks    Status New    Target Date 02/11/21      PT LONG TERM GOAL #4   Title Pt. will ambulate community distances with normalized gait pattern/ no assistive device to improve return to work.    Baseline Pt. ambulates with limited L LE wt. bearing and heavy UE assist on RW. Limited R LE step length during L LE weight bearing due to pain.    Time 8    Period Weeks    Status New    Target Date 02/11/21      PT LONG TERM GOAL #5   Title Pt. will ascend/ descend stairs with recip. pattern and no handrail to improve functional mobility.    Baseline TBD    Time 8    Period Weeks    Status New    Target Date 02/11/21                    Plan - 12/20/20 1046     Clinical Impression Statement Pt. is a pleasant 65 y/o male s/p L TKA on 12/13/2020.  Pt. known well to skilled PT  services and is motivated to return to work.  Pt. reports 5/10 L knee pain currently at rest and >9/10 pain at worse with standing/walking.  Moderate swelling noted in L knee/ lower leg and PT removed ace wrap to don compression stockings to control L lower leg swelling.  R knee AROM: -1 to 128 deg. and strength grossly 5/5 MMT.  Pt. pain limited with L knee A/PROM: extensino  -12/-10 deg.,  flexion 64/72 deg. (guarded/ pain limted).  Pt. ambulates with limited L LE wt. bearing and heavy UE assist on RW.  Limited R LE step length during L LE weight bearing due to pain.  FOTO: initial 12/ goal 45.  Pt. will benefit from skilled PT services to increase L knee AROM/ strength to improve pain-free mobility and return to work.    Stability/Clinical Decision Making Evolving/Moderate complexity    Clinical Decision Making Moderate    Rehab Potential Good    PT Frequency 2x / week    PT Duration 8  weeks    PT Treatment/Interventions ADLs/Self Care Home Management;Cryotherapy;Electrical Stimulation;Functional mobility training;Therapeutic activities;Therapeutic exercise;Patient/family education;Neuromuscular re-education;Manual techniques;Scar mobilization;Passive range of motion;Stair training;Gait training;DME Instruction;Balance training    PT Next Visit Plan Progress L knee ROM.  Reassess HEP.    PT Home Exercise Plan Access Code: [email or web address]    [email or web address] and Agree with Plan of Care Patient             Patient will benefit from skilled therapeutic intervention in order to improve the following deficits and impairments:  Decreased mobility, Decreased strength, Impaired flexibility, Hypomobility, Pain, Abnormal gait, Decreased balance, Difficulty walking, Decreased range of motion, Decreased scar mobility, Decreased activity tolerance  Visit Diagnosis: S/P total knee replacement, left  Joint stiffness of knee, left  Muscle weakness (generalized)  Gait difficulty  Acute pain of left  knee     Problem List Patient Active Problem List   Diagnosis Date Noted   Complex tear of medial meniscus of left knee as current injury 08/22/2020   Primary osteoarthritis of left knee 06/06/2020   Left knee pain 03/29/2020   Primary insomnia 03/29/2020   Encounter for medication titration 02/09/2020   Unstable angina (HCC)    Chest discomfort 12/12/2019   History of colonic polyps    Annual physical exam 07/01/2019   ED (erectile dysfunction) of organic origin 02/21/2015   Diabetes mellitus type 2 with complications, uncontrolled 10/18/2014   Hyperlipemia 10/18/2014   Pura Spice, PT, DPT # [phone] 12/20/2020, 11:01 AM  [facility] [address]. [place], [place], [address] Phone: [phone]   Fax:  [phone]  Name: JELAN BATTERTON MRN: [id number] Date of Birth: 1955/05/18

## 2020-12-20 NOTE — Therapy (Signed)
[facility] [facility] [address] [place], [place], [address] Phone: [phone]   Fax:  [phone]  Physical Therapy Treatment  Patient Details  Name: Curtis Singh MRN: [id number] Date of Birth: [birth year] Referring Provider (PT): Dr. Roland Rack   Encounter Date: 12/20/2020   PT End of Session - 12/20/20 1410     Visit Number 2    Number of Visits 17    Date for PT Re-Evaluation 02/11/21    Authorization - Visit Number 2    Authorization - Number of Visits 10    PT Start Time 1115    PT Stop Time 1200    PT Time Calculation (min) 45 min    Equipment Utilized During Treatment Gait belt    Activity Tolerance Patient tolerated treatment well;Patient limited by pain    Behavior During Therapy [facility] for tasks assessed/performed             Past Medical History:  Diagnosis Date   Aortic atherosclerosis ([place])    Coronary artery disease 12/2019   a.) LHC 12/16/2019: EF 55-65%, LVEDP norm. 99% p-mRCA, 20% mRCA. --> PCI placing a 3.0 x 22 mm Resolute Onyx DES to pRCA.   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    HLD (hyperlipidemia)    Hypertension    Long term current use of antithrombotics/antiplatelets    a.) DAPT (ASA + ticagrelor)   Osteoarthritis    Seasonal allergies    T2DM (type 2 diabetes mellitus) ([place])     Past Surgical History:  Procedure Laterality Date   ARTHOSCOPIC ROTAOR CUFF REPAIR Left 02/25/2018   Procedure: LEFT ARTHROSCOPIC ROTATOR CUFF REPAIR; SUBACROMIAL DECOMPRESSION;  Surgeon: Tania Ade, MD;  [facility];  Service: Orthopedics;  Laterality: Left;   CATARACT EXTRACTION W/PHACO Right 09/03/2016   Procedure: CATARACT EXTRACTION PHACO AND INTRAOCULAR LENS PLACEMENT ([place]) RIGHT DIABETIC;  Surgeon: Leandrew Koyanagi, MD;  Location: [place];  Service: Ophthalmology;  Laterality: Right;  DIABETIC   COLONOSCOPY WITH  PROPOFOL N/A 08/29/2019   Procedure: COLONOSCOPY WITH PROPOFOL;  Surgeon: Lin Landsman, MD;  Location: [facility]COPY;  Service: Gastroenterology;  Laterality: N/A;   CORONARY STENT INTERVENTION N/A 12/16/2019   Procedure: CORONARY STENT INTERVENTION (3.0 x 22 mm Resolute Onyx DES to pRCA);  Surgeon: Wellington Hampshire, MD;  Location: [place] [facility];  Service: Cardiovascular;  Laterality: N/A;   LEFT HEART CATH AND CORONARY ANGIOGRAPHY N/A 12/16/2019   Procedure: LEFT HEART CATH AND CORONARY ANGIOGRAPHY;  Surgeon: Wellington Hampshire, MD;  Location: [place] [facility];  Service: Cardiovascular;  Laterality: N/A;   ROTATOR CUFF REPAIR Right 01/07/2007   TOTAL KNEE ARTHROPLASTY Left 12/13/2020   Procedure: TOTAL KNEE ARTHROPLASTY;  Surgeon: Corky Mull, MD;  Location: [facility];  Service: Orthopedics;  Laterality: Left;   VASECTOMY N/A     There were no vitals filed for this visit.   Subjective Assessment - 12/20/20 1122     Subjective Patient notes he continues to have high level of pain. He is limited in sleep and positioning throughout the day because of pain. Patient has been trying to do exercises. Patient notes decreased appetite as well, but has been able to have BM and did have breakfast before arriving today. He is weaning off oxycodone as able. Patient intends to don compression stockings on return home.    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 Pt. 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  Pt. known well to skilled PT services.  Pt. has good caregiver assist for daily household tasks/ driving.    Limitations Standing;Walking;House hold activities    Patient Stated Goals Increase L knee AROM/ strength to improve pain-free mobility and return to work.    Currently in Pain? Yes    Pain Score 6     Pain Location Knee    Pain Orientation Left             TREATMENT  Therapeutic Exercise: Seated L heel slides, AAROM faded to AROM,  5 sec x 20 Supine PROM, L Knee, flexion and extension with end range holds Supine AAROM terminal knee extension (quad sets) to fatigue Standing weight shifts to L, RW, CGA, 10 sec hold x10 Gait in hallway, RW, VCs to weight shift and clear RLE   Post-treatment assessment: L knee flexion (PROM): supine, 70 degrees  Patient educated throughout session on appropriate technique and form using multi-modal cueing, HEP, and activity modification. Patient articulated understanding and returned demonstration.  Patient Response to interventions: 7/10 pain  ASSESSMENT Patient presents to clinic with excellent motivation to participate in therapy. Patient demonstrates deficits in gait, LLE ROM, LLE strength, and pain. Patient able to tolerate supine PROM to 70 degrees of L knee flexion during today's session and responded positively to WB interventions although did have some mild nausea with increased reps. Patient's ROM for L knee flexion in supine seemed limited by both post-surgical state and guarding. Patient educated on importance and benefit of continued compression stocking use; endorsed intent to don at home. Patient will benefit from continued skilled therapeutic intervention to address remaining deficits in gait, LLE ROM, LLE strength, and pain  in order to increase function and improve overall QOL.      PT Long Term Goals - 12/20/20 1053       PT LONG TERM GOAL #1   Title Pt. will increase FOTO to 45 to improve pain-free moblity with daily tasks.    Baseline FOTO: initial: 12    Time 8    Period Weeks    Status New    Target Date 02/11/21      PT LONG TERM GOAL #2   Title Pt. will increase L knee AROM to 0->115 deg. to improve walking/ stairs/ return to work.    Baseline R knee AROM: -1 to 128 deg. and strength grossly 5/5 MMT. Pt. pain limited with L knee A/PROM: extensino -12/-10 deg., flexion 64/72 deg. (guarded/ pain limted).    Time 8    Period Weeks    Status New    Target  Date 02/11/21      PT LONG TERM GOAL #3   Title Pt. will stand from chair with proper technique/ no UE assist to improve pain-free mobility.    Baseline 9/10 L knee pain with sit to stands    Time 8    Period Weeks    Status New    Target Date 02/11/21      PT LONG TERM GOAL #4   Title Pt. will ambulate community distances with normalized gait pattern/ no assistive device to improve return to work.    Baseline Pt. ambulates with limited L LE wt. bearing and heavy UE assist on RW. Limited R LE step length during L LE weight bearing due to pain.    Time 8    Period  Weeks    Status New    Target Date 02/11/21      PT LONG TERM GOAL #5   Title Pt. will ascend/ descend stairs with recip. pattern and no handrail to improve functional mobility.    Baseline TBD    Time 8    Period Weeks    Status New    Target Date 02/11/21                   Plan - 12/20/20 1410     Clinical Impression Statement Patient presents to clinic with excellent motivation to participate in therapy. Patient demonstrates deficits in gait, LLE ROM, LLE strength, and pain. Patient able to tolerate supine PROM to 70 degrees of L knee flexion during today's session and responded positively to WB interventions although did have some mild nausea with increased reps. Patient's ROM for L knee flexion in supine seemed limited by both post-surgical state and guarding. Patient educated on importance and benefit of continued compression stocking use; endorsed intent to don at home. Patient will benefit from continued skilled therapeutic intervention to address remaining deficits in gait, LLE ROM, LLE strength, and pain  in order to increase function and improve overall QOL.    Stability/Clinical Decision Making Evolving/Moderate complexity    Rehab Potential Good    PT Frequency 2x / week    PT Duration 8 weeks    PT Treatment/Interventions ADLs/Self Care Home Management;Cryotherapy;Electrical Stimulation;Functional  mobility training;Therapeutic activities;Therapeutic exercise;Patient/family education;Neuromuscular re-education;Manual techniques;Scar mobilization;Passive range of motion;Stair training;Gait training;DME Instruction;Balance training    PT Next Visit Plan Progress L knee ROM.  Reassess HEP.    PT Home Exercise Plan Access Code: [email or web address]    [email or web address] and Agree with Plan of Care Patient             Patient will benefit from skilled therapeutic intervention in order to improve the following deficits and impairments:  Decreased mobility, Decreased strength, Impaired flexibility, Hypomobility, Pain, Abnormal gait, Decreased balance, Difficulty walking, Decreased range of motion, Decreased scar mobility, Decreased activity tolerance  Visit Diagnosis: S/P total knee replacement, left  Joint stiffness of knee, left  Muscle weakness (generalized)     Problem List Patient Active Problem List   Diagnosis Date Noted   Complex tear of medial meniscus of left knee as current injury 08/22/2020   Primary osteoarthritis of left knee 06/06/2020   Left knee pain 03/29/2020   Primary insomnia 03/29/2020   Encounter for medication titration 02/09/2020   Unstable angina (HCC)    Chest discomfort 12/12/2019   History of colonic polyps    Annual physical exam 07/01/2019   ED (erectile dysfunction) of organic origin 02/21/2015   Diabetes mellitus type 2 with complications, uncontrolled 10/18/2014   Hyperlipemia 10/18/2014    Myles Gip PT, DPT [phone]  12/20/2020, 2:23 PM  [facility] [address] [place], [place], [address] Phone: [phone]   Fax:  [phone]  Name: TORBEN SOLOWAY MRN: [id number] Date of Birth: 06-Jan-1956

## 2020-12-21 ENCOUNTER — Other Ambulatory Visit: Payer: Self-pay | Admitting: Internal Medicine

## 2020-12-21 ENCOUNTER — Other Ambulatory Visit: Payer: Self-pay

## 2020-12-21 MED ORDER — ZOLPIDEM TARTRATE 10 MG PO TABS
10.0000 mg | ORAL_TABLET | Freq: Every evening | ORAL | 1 refills | Status: DC | PRN
  Filled 2020-12-21: qty 30, 30d supply, fill #0
  Filled 2021-01-22: qty 30, 30d supply, fill #1

## 2020-12-21 MED FILL — Omeprazole Cap Delayed Release 20 MG: ORAL | 90 days supply | Qty: 90 | Fill #2 | Status: AC

## 2020-12-24 ENCOUNTER — Ambulatory Visit: Payer: No Typology Code available for payment source | Admitting: Physical Therapy

## 2020-12-24 ENCOUNTER — Other Ambulatory Visit: Payer: Self-pay

## 2020-12-24 DIAGNOSIS — Z96652 Presence of left artificial knee joint: Secondary | ICD-10-CM | POA: Diagnosis not present

## 2020-12-24 DIAGNOSIS — M25662 Stiffness of left knee, not elsewhere classified: Secondary | ICD-10-CM

## 2020-12-24 DIAGNOSIS — M6281 Muscle weakness (generalized): Secondary | ICD-10-CM

## 2020-12-24 DIAGNOSIS — R269 Unspecified abnormalities of gait and mobility: Secondary | ICD-10-CM

## 2020-12-24 DIAGNOSIS — M25562 Pain in left knee: Secondary | ICD-10-CM

## 2020-12-25 ENCOUNTER — Other Ambulatory Visit: Payer: Self-pay

## 2020-12-26 ENCOUNTER — Other Ambulatory Visit: Payer: Self-pay

## 2020-12-26 ENCOUNTER — Ambulatory Visit: Payer: No Typology Code available for payment source | Admitting: Physical Therapy

## 2020-12-26 DIAGNOSIS — M6281 Muscle weakness (generalized): Secondary | ICD-10-CM

## 2020-12-26 DIAGNOSIS — Z96652 Presence of left artificial knee joint: Secondary | ICD-10-CM

## 2020-12-26 DIAGNOSIS — R269 Unspecified abnormalities of gait and mobility: Secondary | ICD-10-CM

## 2020-12-26 DIAGNOSIS — M25562 Pain in left knee: Secondary | ICD-10-CM

## 2020-12-26 DIAGNOSIS — M25662 Stiffness of left knee, not elsewhere classified: Secondary | ICD-10-CM

## 2020-12-26 NOTE — Therapy (Signed)
[place] [facility] [address] [place], [place], [address] Phone: [phone]   Fax:  [phone]  Physical Therapy Treatment  Patient Details  Name: Curtis Singh MRN: [id number] Date of Birth: 23-Sep-1955 Referring Provider (PT): Dr. Roland Rack   Encounter Date: 12/24/2020   PT End of Session - 12/26/20 1441     Visit Number 3    Number of Visits 17    Date for PT Re-Evaluation 02/11/21    Authorization - Visit Number 3    Authorization - Number of Visits 10    PT Start Time 3335    PT Stop Time 1118    PT Time Calculation (min) 50 min    Equipment Utilized During Treatment Gait belt    Activity Tolerance Patient limited by pain    Behavior During Therapy [facility] for tasks assessed/performed             Past Medical History:  Diagnosis Date   Aortic atherosclerosis ([place])    Coronary artery disease 12/2019   a.) LHC 12/16/2019: EF 55-65%, LVEDP norm. 99% p-mRCA, 20% mRCA. --> PCI placing a 3.0 x 22 mm Resolute Onyx DES to pRCA.   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    HLD (hyperlipidemia)    Hypertension    Long term current use of antithrombotics/antiplatelets    a.) DAPT (ASA + ticagrelor)   Osteoarthritis    Seasonal allergies    T2DM (type 2 diabetes mellitus) ([place])     Past Surgical History:  Procedure Laterality Date   ARTHOSCOPIC ROTAOR CUFF REPAIR Left 02/25/2018   Procedure: LEFT ARTHROSCOPIC ROTATOR CUFF REPAIR; SUBACROMIAL DECOMPRESSION;  Surgeon: Tania Ade, MD;  [facility];  Service: Orthopedics;  Laterality: Left;   CATARACT EXTRACTION W/PHACO Right 09/03/2016   Procedure: CATARACT EXTRACTION PHACO AND INTRAOCULAR LENS PLACEMENT ([place]) RIGHT DIABETIC;  Surgeon: Leandrew Koyanagi, MD;  Location: [place];  Service: Ophthalmology;  Laterality: Right;  DIABETIC   COLONOSCOPY WITH PROPOFOL N/A 08/29/2019    Procedure: COLONOSCOPY WITH PROPOFOL;  Surgeon: Lin Landsman, MD;  Location: [facility] ENDOSCOPY;  Service: Gastroenterology;  Laterality: N/A;   CORONARY STENT INTERVENTION N/A 12/16/2019   Procedure: CORONARY STENT INTERVENTION (3.0 x 22 mm Resolute Onyx DES to pRCA);  Surgeon: Wellington Hampshire, MD;  Location: [place] CV LAB;  Service: Cardiovascular;  Laterality: N/A;   LEFT HEART CATH AND CORONARY ANGIOGRAPHY N/A 12/16/2019   Procedure: LEFT HEART CATH AND CORONARY ANGIOGRAPHY;  Surgeon: Wellington Hampshire, MD;  Location: [place] CV LAB;  Service: Cardiovascular;  Laterality: N/A;   ROTATOR CUFF REPAIR Right 01/07/2007   TOTAL KNEE ARTHROPLASTY Left 12/13/2020   Procedure: TOTAL KNEE ARTHROPLASTY;  Surgeon: Corky Mull, MD;  Location: [facility];  Service: Orthopedics;  Laterality: Left;   VASECTOMY N/A     There were no vitals filed for this visit.   Subjective Assessment - 12/26/20 1432     Subjective Pt. entered PT with 7/10 L knee pain and moderate antalgic gait pattern with use of RW.  Pt. had a BM this morning after 4 days of not using bathroom.  Pt. states he has stopped taking prescription pain pills secondary to constipation.    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 Pt. 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  Pt. known well to skilled PT  services.  Pt. has good caregiver assist for daily household tasks/ driving.    Limitations Standing;Walking;House hold activities    Patient Stated Goals Increase L knee AROM/ strength to improve pain-free mobility and return to work.    Currently in Pain? Yes    Pain Score 7     Pain Location Knee    Pain Orientation Left    Pain Descriptors / Indicators Constant;Aching             Therex.:   Nustep L0, seat 11-8 for 10 min. B UE/LE (warm-up)- discussed weekend/ HEP.    2nd knee flexion at stairs with handrail support (quad/hamstring stretches)- 3x each with static holds  Supine  ex. Program (SLR/ heel slides/ quad sets/ hamstring sets)- 20x each.  Seated L knee flexion AAROM with static holds  Manual tx.:  Supine L knee AAROM flexion extension 5x each.   Patellar mobs (all planes)  STM to L distal quad/ hamstring and proximal gastroc    PT Short Term Goals - 06/30/18 2040       PT SHORT TERM GOAL #1   Title Pt. will increase FOTO to 65 to improve pain-free mobility with lifting/ work-related tasks.    Baseline initial FOTO on 06/01/2018:  59.   6/23: 72.    Time 4    Period Weeks    Status Achieved    Target Date 06/29/18      PT SHORT TERM GOAL #2   Title Pt. able to demonstrate proper lifting technique of 40# box with no c/o R low back pain to improve work-related tasks.     Baseline Good mechanics with UE lifting ex./ box carrying/ sled push and pull.    Time 4    Period Weeks    Status Achieved    Target Date 06/29/18      PT SHORT TERM GOAL #3   Title Pt. will report no R SI/lumbar tenderness with palpation to improve pain-free mobility.      Baseline (+) R SI tenderness.  Pt. reports no pain at rest in sitting/standing posture.     Time 4    Period Weeks    Status Partially Met    Target Date 06/29/18      PT SHORT TERM GOAL #4   Title Pt. able to ride motorcycle with no c/o R low back pain.      Baseline Pt. able to ride bike with no limitations.  Pt. did report slight discomfort in low back with handling bike at stops.    Time 4    Period Weeks    Status Achieved    Target Date 06/29/18               PT Long Term Goals - 12/20/20 1053       PT LONG TERM GOAL #1   Title Pt. will increase FOTO to 45 to improve pain-free moblity with daily tasks.    Baseline FOTO: initial: 12    Time 8    Period Weeks    Status New    Target Date 02/11/21      PT LONG TERM GOAL #2   Title Pt. will increase L knee AROM to 0->115 deg. to improve walking/ stairs/ return to work.    Baseline R knee AROM: -1 to 128 deg. and strength grossly  5/5 MMT. Pt. pain limited with L knee A/PROM: extensino -12/-10 deg., flexion 64/72 deg. (guarded/ pain limted).    Time 8  Period Weeks    Status New    Target Date 02/11/21      PT LONG TERM GOAL #3   Title Pt. will stand from chair with proper technique/ no UE assist to improve pain-free mobility.    Baseline 9/10 L knee pain with sit to stands    Time 8    Period Weeks    Status New    Target Date 02/11/21      PT LONG TERM GOAL #4   Title Pt. will ambulate community distances with normalized gait pattern/ no assistive device to improve return to work.    Baseline Pt. ambulates with limited L LE wt. bearing and heavy UE assist on RW. Limited R LE step length during L LE weight bearing due to pain.    Time 8    Period Weeks    Status New    Target Date 02/11/21      PT LONG TERM GOAL #5   Title Pt. will ascend/ descend stairs with recip. pattern and no handrail to improve functional mobility.    Baseline TBD    Time 8    Period Weeks    Status New    Target Date 02/11/21                   Plan - 12/26/20 1442     Clinical Impression Statement Pt. pain limited with L knee ROM and progression in gait training/independence.  Pt. ambulates with limited L hip/knee flexion and difficulty with knee extension/ heel strike during swing through to mid-stance phase of gait.   Pain limited with L knee extension in supine position and lacking 8 deg. of extension after manual stretches/ patellar mobs.  Pt. will continue with current HEP and instructed to focus on extension/ flexion.  Pt. has 1 more PT visit prior to MD f/u visit this week.    Stability/Clinical Decision Making Evolving/Moderate complexity    Clinical Decision Making Moderate    Rehab Potential Good    PT Frequency 2x / week    PT Duration 8 weeks    PT Treatment/Interventions ADLs/Self Care Home Management;Cryotherapy;Electrical Stimulation;Functional mobility training;Therapeutic activities;Therapeutic  exercise;Patient/family education;Neuromuscular re-education;Manual techniques;Scar mobilization;Passive range of motion;Stair training;Gait training;DME Instruction;Balance training    PT Next Visit Plan Progress L knee ROM. Send MD progress note    PT Home Exercise Plan Access Code: [email or web address]    Consulted and Agree with Plan of Care Patient             Patient will benefit from skilled therapeutic intervention in order to improve the following deficits and impairments:  Decreased mobility, Decreased strength, Impaired flexibility, Hypomobility, Pain, Abnormal gait, Decreased balance, Difficulty walking, Decreased range of motion, Decreased scar mobility, Decreased activity tolerance  Visit Diagnosis: S/P total knee replacement, left  Joint stiffness of knee, left  Muscle weakness (generalized)  Gait difficulty  Acute pain of left knee     Problem List Patient Active Problem List   Diagnosis Date Noted   Complex tear of medial meniscus of left knee as current injury 08/22/2020   Primary osteoarthritis of left knee 06/06/2020   Left knee pain 03/29/2020   Primary insomnia 03/29/2020   Encounter for medication titration 02/09/2020   Unstable angina (HCC)    Chest discomfort 12/12/2019   History of colonic polyps    Annual physical exam 07/01/2019   ED (erectile dysfunction) of organic origin 02/21/2015   Diabetes mellitus type 2 with complications, uncontrolled 10/18/2014  Hyperlipemia 10/18/2014   Pura Spice, PT, DPT # [id number] 12/26/2020, 2:51 PM  [place] [facility] [address] [place], [place], [address] Phone: [phone]   Fax:  [phone]  Name: Curtis Singh MRN: [id number] Date of Birth: 01-02-1956

## 2020-12-28 ENCOUNTER — Other Ambulatory Visit: Payer: Self-pay

## 2020-12-28 MED ORDER — TRAMADOL HCL 50 MG PO TABS
ORAL_TABLET | ORAL | 0 refills | Status: DC
  Filled 2020-12-28: qty 42, 7d supply, fill #0

## 2021-01-01 ENCOUNTER — Ambulatory Visit: Payer: No Typology Code available for payment source | Admitting: Physical Therapy

## 2021-01-01 ENCOUNTER — Encounter: Payer: Self-pay | Admitting: Physical Therapy

## 2021-01-01 ENCOUNTER — Other Ambulatory Visit: Payer: Self-pay

## 2021-01-01 DIAGNOSIS — M25662 Stiffness of left knee, not elsewhere classified: Secondary | ICD-10-CM

## 2021-01-01 DIAGNOSIS — R269 Unspecified abnormalities of gait and mobility: Secondary | ICD-10-CM

## 2021-01-01 DIAGNOSIS — M6281 Muscle weakness (generalized): Secondary | ICD-10-CM

## 2021-01-01 DIAGNOSIS — M25562 Pain in left knee: Secondary | ICD-10-CM

## 2021-01-01 DIAGNOSIS — Z96652 Presence of left artificial knee joint: Secondary | ICD-10-CM

## 2021-01-01 NOTE — Therapy (Signed)
[place] [facility] [address] [place], [place], [address] Phone: [phone]   Fax:  [phone]  Physical Therapy Treatment  Patient Details  Name: Curtis Singh MRN: [id number] Date of Birth: 03-Feb-1955 Referring Provider (PT): Dr. Roland Rack   Encounter Date: 01/01/2021   PT End of Session - 01/01/21 1228     Visit Number 4    Number of Visits 17    Date for PT Re-Evaluation 02/11/21    Authorization - Visit Number 4    Authorization - Number of Visits 10    PT Start Time 1221    PT Stop Time 7867    PT Time Calculation (min) 56 min    Equipment Utilized During Treatment Gait belt    Activity Tolerance Patient limited by pain;Patient tolerated treatment well    Behavior During Therapy [facility] for tasks assessed/performed             Past Medical History:  Diagnosis Date   Aortic atherosclerosis ([place])    Coronary artery disease 12/2019   a.) LHC 12/16/2019: EF 55-65%, LVEDP norm. 99% p-mRCA, 20% mRCA. --> PCI placing a 3.0 x 22 mm Resolute Onyx DES to pRCA.   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    HLD (hyperlipidemia)    Hypertension    Long term current use of antithrombotics/antiplatelets    a.) DAPT (ASA + ticagrelor)   Osteoarthritis    Seasonal allergies    T2DM (type 2 diabetes mellitus) ([place])     Past Surgical History:  Procedure Laterality Date   ARTHOSCOPIC ROTAOR CUFF REPAIR Left 02/25/2018   Procedure: LEFT ARTHROSCOPIC ROTATOR CUFF REPAIR; SUBACROMIAL DECOMPRESSION;  Surgeon: Tania Ade, MD;  [facility];  Service: Orthopedics;  Laterality: Left;   CATARACT EXTRACTION W/PHACO Right 09/03/2016   Procedure: CATARACT EXTRACTION PHACO AND INTRAOCULAR LENS PLACEMENT ([place]) RIGHT DIABETIC;  Surgeon: Leandrew Koyanagi, MD;  Location: [place];  Service: Ophthalmology;  Laterality: Right;  DIABETIC   COLONOSCOPY WITH  PROPOFOL N/A 08/29/2019   Procedure: COLONOSCOPY WITH PROPOFOL;  Surgeon: Lin Landsman, MD;  Location: [facility] ENDOSCOPY;  Service: Gastroenterology;  Laterality: N/A;   CORONARY STENT INTERVENTION N/A 12/16/2019   Procedure: CORONARY STENT INTERVENTION (3.0 x 22 mm Resolute Onyx DES to pRCA);  Surgeon: Wellington Hampshire, MD;  Location: [place] CV LAB;  Service: Cardiovascular;  Laterality: N/A;   LEFT HEART CATH AND CORONARY ANGIOGRAPHY N/A 12/16/2019   Procedure: LEFT HEART CATH AND CORONARY ANGIOGRAPHY;  Surgeon: Wellington Hampshire, MD;  Location: [place] CV LAB;  Service: Cardiovascular;  Laterality: N/A;   ROTATOR CUFF REPAIR Right 01/07/2007   TOTAL KNEE ARTHROPLASTY Left 12/13/2020   Procedure: TOTAL KNEE ARTHROPLASTY;  Surgeon: Corky Mull, MD;  Location: [facility];  Service: Orthopedics;  Laterality: Left;   VASECTOMY N/A     There were no vitals filed for this visit.   Subjective Assessment - 01/01/21 1227     Subjective Pt. had f/u with MD on 12/23 and has steristrips in place.  Pt. reports having a couple difficult nights of pain/throbbing in L knee.  Pt. states he is sleeping in recliner mostly and occasionally bed.  Pt. reports riding in car is really difficult, esp. for extended periods of time.  Pt. entered PT with use of RW and L antalgic gait/ limited knee extension during heel strike.  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 Pt. 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  Pt. known well to skilled PT services.  Pt. has good caregiver assist for daily household tasks/ driving.    Limitations Standing;Walking;House hold activities    Patient Stated Goals Increase L knee AROM/ strength to improve pain-free mobility and return to work.    Currently in Pain? Yes    Pain Score 4     Pain Location Knee    Pain Orientation Left              Therex.:    Nustep L2, seat 11-9 for 12 min. B UE/LE (warm-up)- discussed  weekend/ MD f/u  2nd step knee flexion and hamstring stretches (static) at stairs with handrail support)- 5x each with static holds as tolerated.     Walking in //-bars:  forward (focus on L knee ext.)/ backwards/ lateral with mirror feedback 4x each.  STS for gray chair: 5x2 with armrest assist.  Cuing to bend L knee and equalize wt. Bearing.  Seated LAQ (cuing to avoid L hip flexion during L quad activation)- min. PT assist to increase L knee extension/ static holds.    Seated contract-relax to increase L knee flexion 5x (pain limited).     Seated L knee flexion AAROM with static holds     Manual tx.:   Supine L knee AAROM flexion extension 5x each.   -8 to 85 deg. (After manual tx.   Patellar mobs (all planes)   STM to L distal quad/ hamstring and proximal gastroc     Pt. Will ice at home    PT Long Term Goals - 12/20/20 1053       PT LONG TERM GOAL #1   Title Pt. will increase FOTO to 45 to improve pain-free moblity with daily tasks.    Baseline FOTO: initial: 12    Time 8    Period Weeks    Status New    Target Date 02/11/21      PT LONG TERM GOAL #2   Title Pt. will increase L knee AROM to 0->115 deg. to improve walking/ stairs/ return to work.    Baseline R knee AROM: -1 to 128 deg. and strength grossly 5/5 MMT. Pt. pain limited with L knee A/PROM: extensino -12/-10 deg., flexion 64/72 deg. (guarded/ pain limted).    Time 8    Period Weeks    Status New    Target Date 02/11/21      PT LONG TERM GOAL #3   Title Pt. will stand from chair with proper technique/ no UE assist to improve pain-free mobility.    Baseline 9/10 L knee pain with sit to stands    Time 8    Period Weeks    Status New    Target Date 02/11/21      PT LONG TERM GOAL #4   Title Pt. will ambulate community distances with normalized gait pattern/ no assistive device to improve return to work.    Baseline Pt. ambulates with limited L LE wt. bearing and heavy UE assist on RW. Limited R LE  step length during L LE weight bearing due to pain.    Time 8    Period Weeks    Status New    Target Date 02/11/21      PT LONG TERM GOAL #5   Title Pt. will ascend/ descend stairs with recip. pattern and no handrail  to improve functional mobility.    Baseline TBD    Time 8    Period Weeks    Status New    Target Date 02/11/21                   Plan - 01/01/21 1416     Clinical Impression Statement Pt. remains pain limited/ guarded with L knee extension/ flexion.  Pt. did benefit from seated L knee contract-relax technique to increase knee flexion to 85 deg.  Pt. cued to prevent L hip hike during L knee stretches in seated/supine position.  Good incision healing and steristrips in place.  Pt. educated to avoid use of lotion at this time until scabs healed.  L knee extension remains limited to -8 deg. due to pain.  Pt. unable to tolerate OP or PROM to L knee in supine due to pain.  Pt. cued during ambulation to correct L knee extension/ step length to promote a more normalized gait pattern.  Pt. will continue to benefit from skilled PT services to increase L knee ROM/ strength to promote return to work.    Stability/Clinical Decision Making Evolving/Moderate complexity    Clinical Decision Making Moderate    Rehab Potential Good    PT Frequency 2x / week    PT Duration 8 weeks    PT Treatment/Interventions ADLs/Self Care Home Management;Cryotherapy;Electrical Stimulation;Functional mobility training;Therapeutic activities;Therapeutic exercise;Patient/family education;Neuromuscular re-education;Manual techniques;Scar mobilization;Passive range of motion;Stair training;Gait training;DME Instruction;Balance training    PT Next Visit Plan Progress L knee ROM.    PT Home Exercise Plan Access Code: [email or web address]    [email or web address] and Agree with Plan of Care Patient             Patient will benefit from skilled therapeutic intervention in order to improve the following deficits and  impairments:  Decreased mobility, Decreased strength, Impaired flexibility, Hypomobility, Pain, Abnormal gait, Decreased balance, Difficulty walking, Decreased range of motion, Decreased scar mobility, Decreased activity tolerance  Visit Diagnosis: S/P total knee replacement, left  Joint stiffness of knee, left  Muscle weakness (generalized)  Gait difficulty  Acute pain of left knee     Problem List Patient Active Problem List   Diagnosis Date Noted   Complex tear of medial meniscus of left knee as current injury 08/22/2020   Primary osteoarthritis of left knee 06/06/2020   Left knee pain 03/29/2020   Primary insomnia 03/29/2020   Encounter for medication titration 02/09/2020   Unstable angina (HCC)    Chest discomfort 12/12/2019   History of colonic polyps    Annual physical exam 07/01/2019   ED (erectile dysfunction) of organic origin 02/21/2015   Diabetes mellitus type 2 with complications, uncontrolled 10/18/2014   Hyperlipemia 10/18/2014   Pura Spice, PT, DPT # [phone] 01/01/2021, 2:31 PM  [place] [facility] [address] [place], [place], [address] Phone: [phone]   Fax:  [phone]  Name: AUTHER LYERLY MRN: [id number] Date of Birth: Aug 25, 1955

## 2021-01-02 ENCOUNTER — Encounter: Payer: No Typology Code available for payment source | Admitting: Physical Therapy

## 2021-01-03 ENCOUNTER — Encounter: Payer: Self-pay | Admitting: Physical Therapy

## 2021-01-03 ENCOUNTER — Ambulatory Visit: Payer: No Typology Code available for payment source | Admitting: Physical Therapy

## 2021-01-03 ENCOUNTER — Other Ambulatory Visit: Payer: Self-pay

## 2021-01-03 DIAGNOSIS — Z96652 Presence of left artificial knee joint: Secondary | ICD-10-CM

## 2021-01-03 DIAGNOSIS — M25662 Stiffness of left knee, not elsewhere classified: Secondary | ICD-10-CM

## 2021-01-03 DIAGNOSIS — M6281 Muscle weakness (generalized): Secondary | ICD-10-CM

## 2021-01-03 NOTE — Therapy (Signed)
[facility] [facility] [address] [place], [place], [address] Phone: [phone]   Fax:  [phone]  Physical Therapy Treatment  Patient Details  Name: Curtis Singh MRN: [id number] Date of Birth: June 08, 1955 Referring Provider (PT): Dr. Roland Rack   Encounter Date: 01/03/2021   PT End of Session - 01/03/21 1025     Visit Number 6    Number of Visits 17    Date for PT Re-Evaluation 02/11/21    Authorization - Visit Number 6    Authorization - Number of Visits 10    PT Start Time 1020    PT Stop Time 4034    PT Time Calculation (min) 45 min    Equipment Utilized During Treatment Gait belt    Activity Tolerance Patient limited by pain;Patient tolerated treatment well    Behavior During Therapy [facility] for tasks assessed/performed             Past Medical History:  Diagnosis Date   Aortic atherosclerosis ([place])    Coronary artery disease 12/2019   a.) LHC 12/16/2019: EF 55-65%, LVEDP norm. 99% p-mRCA, 20% mRCA. --> PCI placing a 3.0 x 22 mm Resolute Onyx DES to pRCA.   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    HLD (hyperlipidemia)    Hypertension    Long term current use of antithrombotics/antiplatelets    a.) DAPT (ASA + ticagrelor)   Osteoarthritis    Seasonal allergies    T2DM (type 2 diabetes mellitus) ([place])     Past Surgical History:  Procedure Laterality Date   ARTHOSCOPIC ROTAOR CUFF REPAIR Left 02/25/2018   Procedure: LEFT ARTHROSCOPIC ROTATOR CUFF REPAIR; SUBACROMIAL DECOMPRESSION;  Surgeon: Tania Ade, MD;  [facility];  Service: Orthopedics;  Laterality: Left;   CATARACT EXTRACTION W/PHACO Right 09/03/2016   Procedure: CATARACT EXTRACTION PHACO AND INTRAOCULAR LENS PLACEMENT ([place]) RIGHT DIABETIC;  Surgeon: Leandrew Koyanagi, MD;  Location: [place];  Service: Ophthalmology;  Laterality: Right;  DIABETIC   COLONOSCOPY WITH  PROPOFOL N/A 08/29/2019   Procedure: COLONOSCOPY WITH PROPOFOL;  Surgeon: Lin Landsman, MD;  Location: [facility] ENDOSCOPY;  Service: Gastroenterology;  Laterality: N/A;   CORONARY STENT INTERVENTION N/A 12/16/2019   Procedure: CORONARY STENT INTERVENTION (3.0 x 22 mm Resolute Onyx DES to pRCA);  Surgeon: Wellington Hampshire, MD;  Location: [place] [facility];  Service: Cardiovascular;  Laterality: N/A;   LEFT HEART CATH AND CORONARY ANGIOGRAPHY N/A 12/16/2019   Procedure: LEFT HEART CATH AND CORONARY ANGIOGRAPHY;  Surgeon: Wellington Hampshire, MD;  Location: [place] [facility];  Service: Cardiovascular;  Laterality: N/A;   ROTATOR CUFF REPAIR Right 01/07/2007   TOTAL KNEE ARTHROPLASTY Left 12/13/2020   Procedure: TOTAL KNEE ARTHROPLASTY;  Surgeon: Corky Mull, MD;  Location: [facility];  Service: Orthopedics;  Laterality: Left;   VASECTOMY N/A     There were no vitals filed for this visit.   Subjective Assessment - 01/03/21 1022     Subjective Patient reports that he is doing better. Notes that he is starting see a little more improvement and the knee feels better with just steri strips in place. Patient notes increased stiffness after being sedentary.    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  Pt. known well to skilled PT services.  Pt. has good caregiver  assist for daily household tasks/ driving.    Limitations Standing;Walking;House hold activities    Patient Stated Goals Increase L knee AROM/ strength to improve pain-free mobility and return to work.    Currently in Pain? Yes    Pain Score 4     Pain Location Knee    Pain Orientation Left              Therex.:    Nustep L2, seat 11-9 for 12 min. B UE/LE (warm-up)- discussed weekend/ MD f/u   2nd step knee flexion and hamstring stretches (static) at stairs with handrail support)- 5x each with static holds 30 sec duration.   Walking in  //-bars:  forward (visual and verbal cueing for L knee extension)/ backwards/ lateral with mirror feedback 6x each.   STS for gray chair: 5x2 with armrest assist.  Progressively moved chair closer for increased L knee flexion during descent.   Seated LAQ,  min. PT assist to increase L knee extension/ static holds.     Supine contract-relax to increase L knee flexion 5x (pain limited).     Seated L knee flexion AAROM with static holds, ~ 90 degrees with PT assist       Manual tx.:   Supine L knee AAROM flexion extension 10x each.    Patellar mobs (all planes)   STM to L distal quad/ hamstring and proximal gastroc, no TTP, no TPRs noted.       PT Long Term Goals - 12/20/20 1053       PT LONG TERM GOAL #1   Title Pt. will increase FOTO to 45 to improve pain-free moblity with daily tasks.    Baseline FOTO: initial: 12    Time 8    Period Weeks    Status New    Target Date 02/11/21      PT LONG TERM GOAL #2   Title Pt. will increase L knee AROM to 0->115 deg. to improve walking/ stairs/ return to work.    Baseline R knee AROM: -1 to 128 deg. and strength grossly 5/5 MMT. Pt. pain limited with L knee A/PROM: extensino -12/-10 deg., flexion 64/72 deg. (guarded/ pain limted).    Time 8    Period Weeks    Status New    Target Date 02/11/21      PT LONG TERM GOAL #3   Title Pt. will stand from chair with proper technique/ no UE assist to improve pain-free mobility.    Baseline 9/10 L knee pain with sit to stands    Time 8    Period Weeks    Status New    Target Date 02/11/21      PT LONG TERM GOAL #4   Title Pt. will ambulate community distances with normalized gait pattern/ no assistive device to improve return to work.    Baseline Pt. ambulates with limited L LE wt. bearing and heavy UE assist on RW. Limited R LE step length during L LE weight bearing due to pain.    Time 8    Period Weeks    Status New    Target Date 02/11/21      PT LONG TERM GOAL #5   Title Pt.  will ascend/ descend stairs with recip. pattern and no handrail to improve functional mobility.    Baseline TBD    Time 8    Period Weeks    Status New    Target Date 02/11/21  Plan - 01/03/21 1316     Clinical Impression Statement Patient presents to clinic with excellent motivation to participate in physical therapy, noting improved movement and pain. Patient able to achieve ~80 degrees of L knee flexion in sitting posture without hip hike compensations and tolerated DPT assisted progression of flexion to 90 degrees during today's session. Patient's patella mobilized well in all directions with no significant restrictions noted. Pain rated to 6-7/10 at end of session without pain medication. Patient will benefit from continued skilled therapeutic intervention to address remaining deficits in L knee for improved function and safe return to occupation.    Stability/Clinical Decision Making Evolving/Moderate complexity    Rehab Potential Good    PT Frequency 2x / week    PT Duration 8 weeks    PT Treatment/Interventions ADLs/Self Care Home Management;Cryotherapy;Electrical Stimulation;Functional mobility training;Therapeutic activities;Therapeutic exercise;Patient/family education;Neuromuscular re-education;Manual techniques;Scar mobilization;Passive range of motion;Stair training;Gait training;DME Instruction;Balance training    PT Next Visit Plan Progress L knee ROM.    PT Home Exercise Plan Access Code: [email or web address]    [email or web address] and Agree with Plan of Care Patient             Patient will benefit from skilled therapeutic intervention in order to improve the following deficits and impairments:  Decreased mobility, Decreased strength, Impaired flexibility, Hypomobility, Pain, Abnormal gait, Decreased balance, Difficulty walking, Decreased range of motion, Decreased scar mobility, Decreased activity tolerance  Visit Diagnosis: S/P total knee replacement,  left  Joint stiffness of knee, left  Muscle weakness (generalized)     Problem List Patient Active Problem List   Diagnosis Date Noted   Complex tear of medial meniscus of left knee as current injury 08/22/2020   Primary osteoarthritis of left knee 06/06/2020   Left knee pain 03/29/2020   Primary insomnia 03/29/2020   Encounter for medication titration 02/09/2020   Unstable angina (HCC)    Chest discomfort 12/12/2019   History of colonic polyps    Annual physical exam 07/01/2019   ED (erectile dysfunction) of organic origin 02/21/2015   Diabetes mellitus type 2 with complications, uncontrolled 10/18/2014   Hyperlipemia 10/18/2014    Myles Gip PT, DPT [phone]  01/03/2021, 1:25 PM  [facility] [address] [place], [place], [address] Phone: [phone]   Fax:  [phone]  Name: Curtis Singh MRN: [id number] Date of Birth: July 23, 1955

## 2021-01-03 NOTE — Therapy (Signed)
[place] [facility] [address]. [place], [place], [address] Phone: [phone]   Fax:  [phone]  Physical Therapy Treatment  Patient Details  Name: Curtis Singh MRN: [id number] Date of Birth: [birth year] Referring Provider (PT): Dr. Roland Rack   Encounter Date: 12/26/2020   Treatment: 4 of 17.  Recert date: 01/12/735 1062 to 1125   Past Medical History:  Diagnosis Date   Aortic atherosclerosis ([place])    Coronary artery disease 12/2019   a.) LHC 12/16/2019: EF 55-65%, LVEDP norm. 99% p-mRCA, 20% mRCA. --> PCI placing a 3.0 x 22 mm Resolute Onyx DES to pRCA.   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    HLD (hyperlipidemia)    Hypertension    Long term current use of antithrombotics/antiplatelets    a.) DAPT (ASA + ticagrelor)   Osteoarthritis    Seasonal allergies    T2DM (type 2 diabetes mellitus) ([place])     Past Surgical History:  Procedure Laterality Date   ARTHOSCOPIC ROTAOR CUFF REPAIR Left 02/25/2018   Procedure: LEFT ARTHROSCOPIC ROTATOR CUFF REPAIR; SUBACROMIAL DECOMPRESSION;  Surgeon: Tania Ade, MD;  [facility];  Service: Orthopedics;  Laterality: Left;   CATARACT EXTRACTION W/PHACO Right 09/03/2016   Procedure: CATARACT EXTRACTION PHACO AND INTRAOCULAR LENS PLACEMENT ([place]) RIGHT DIABETIC;  Surgeon: Leandrew Koyanagi, MD;  Location: [place];  Service: Ophthalmology;  Laterality: Right;  DIABETIC   COLONOSCOPY WITH PROPOFOL N/A 08/29/2019   Procedure: COLONOSCOPY WITH PROPOFOL;  Surgeon: Lin Landsman, MD;  Location: [facility] ENDOSCOPY;  Service: Gastroenterology;  Laterality: N/A;   CORONARY STENT INTERVENTION N/A 12/16/2019   Procedure: CORONARY STENT INTERVENTION (3.0 x 22 mm Resolute Onyx DES to pRCA);  Surgeon: Wellington Hampshire, MD;  Location: [place] [facility];  Service: Cardiovascular;  Laterality: N/A;   LEFT  HEART CATH AND CORONARY ANGIOGRAPHY N/A 12/16/2019   Procedure: LEFT HEART CATH AND CORONARY ANGIOGRAPHY;  Surgeon: Wellington Hampshire, MD;  Location: [place] [facility];  Service: Cardiovascular;  Laterality: N/A;   ROTATOR CUFF REPAIR Right 01/07/2007   TOTAL KNEE ARTHROPLASTY Left 12/13/2020   Procedure: TOTAL KNEE ARTHROPLASTY;  Surgeon: Corky Mull, MD;  Location: [facility];  Service: Orthopedics;  Laterality: Left;   VASECTOMY N/A     There were no vitals filed for this visit.     Pt. took 2 pain pills prior to PT tx. session. Pt. entered PT with limited L knee extension/ step pattern.         Therex.:    Nustep L2, seat 10-8 for 10 min. B UE/LE (warm-up).   2nd knee flexion at stairs with handrail support (quad/hamstring stretches)- 3x each with static holds   Supine ex. Program (SLR/ heel slides/ quad sets/ hamstring sets)- 20x each.   Seated L knee flexion AAROM with static holds    Manual tx.:   Supine L knee AAROM flexion extension 5x each.    Patellar mobs (all planes)   STM to L distal quad/ hamstring and proximal gastroc         PT Long Term Goals - 12/20/20 1053       PT LONG TERM GOAL #1   Title Pt. will increase FOTO to 45 to improve pain-free moblity with daily tasks.    Baseline FOTO: initial: 12    Time 8    Period Weeks    Status New  Target Date 02/11/21      PT LONG TERM GOAL #2   Title Pt. will increase L knee AROM to 0->115 deg. to improve walking/ stairs/ return to work.    Baseline R knee AROM: -1 to 128 deg. and strength grossly 5/5 MMT. Pt. pain limited with L knee A/PROM: extensino -12/-10 deg., flexion 64/72 deg. (guarded/ pain limted).    Time 8    Period Weeks    Status New    Target Date 02/11/21      PT LONG TERM GOAL #3   Title Pt. will stand from chair with proper technique/ no UE assist to improve pain-free mobility.    Baseline 9/10 L knee pain with sit to stands    Time 8    Period Weeks    Status New     Target Date 02/11/21      PT LONG TERM GOAL #4   Title Pt. will ambulate community distances with normalized gait pattern/ no assistive device to improve return to work.    Baseline Pt. ambulates with limited L LE wt. bearing and heavy UE assist on RW. Limited R LE step length during L LE weight bearing due to pain.    Time 8    Period Weeks    Status New    Target Date 02/11/21      PT LONG TERM GOAL #5   Title Pt. will ascend/ descend stairs with recip. pattern and no handrail to improve functional mobility.    Baseline TBD    Time 8    Period Weeks    Status New    Target Date 02/11/21              Pt. pain limited with all L knee stretches/ AA/PROM. Pt. limited to 80 deg. L knee AAROM in supine position due to marked increase c/o pain. Tenderness over L quad muscle. Pt. has difficulty tolerating L distal hamstring/ knee extension stretches in supine and seated position. PT educated pt. on the importance of L knee ROM and increasing ROM 1-2 degrees/ day.      Patient will benefit from skilled therapeutic intervention in order to improve the following deficits and impairments:  Decreased mobility, Decreased strength, Impaired flexibility, Hypomobility, Pain, Abnormal gait, Decreased balance, Difficulty walking, Decreased range of motion, Decreased scar mobility, Decreased activity tolerance  Visit Diagnosis: S/P total knee replacement, left  Joint stiffness of knee, left  Muscle weakness (generalized)  Gait difficulty  Acute pain of left knee     Problem List Patient Active Problem List   Diagnosis Date Noted   Complex tear of medial meniscus of left knee as current injury 08/22/2020   Primary osteoarthritis of left knee 06/06/2020   Left knee pain 03/29/2020   Primary insomnia 03/29/2020   Encounter for medication titration 02/09/2020   Unstable angina (HCC)    Chest discomfort 12/12/2019   History of colonic polyps    Annual physical exam 07/01/2019   ED  (erectile dysfunction) of organic origin 02/21/2015   Diabetes mellitus type 2 with complications, uncontrolled 10/18/2014   Hyperlipemia 10/18/2014   Pura Spice, PT, DPT # [phone] 01/03/2021, 9:10 AM  [place] [facility] [address] [place], [place], [address] Phone: [phone]   Fax:  [phone]  Name: Curtis Singh MRN: [id number] Date of Birth: 1955-01-12

## 2021-01-08 ENCOUNTER — Other Ambulatory Visit: Payer: Self-pay

## 2021-01-08 ENCOUNTER — Ambulatory Visit: Payer: No Typology Code available for payment source | Attending: Surgery | Admitting: Physical Therapy

## 2021-01-08 DIAGNOSIS — M6281 Muscle weakness (generalized): Secondary | ICD-10-CM | POA: Diagnosis present

## 2021-01-08 DIAGNOSIS — M25562 Pain in left knee: Secondary | ICD-10-CM | POA: Diagnosis present

## 2021-01-08 DIAGNOSIS — Z96652 Presence of left artificial knee joint: Secondary | ICD-10-CM | POA: Insufficient documentation

## 2021-01-08 DIAGNOSIS — M25662 Stiffness of left knee, not elsewhere classified: Secondary | ICD-10-CM | POA: Diagnosis present

## 2021-01-08 DIAGNOSIS — R269 Unspecified abnormalities of gait and mobility: Secondary | ICD-10-CM | POA: Insufficient documentation

## 2021-01-09 ENCOUNTER — Encounter: Payer: No Typology Code available for payment source | Admitting: Physical Therapy

## 2021-01-09 ENCOUNTER — Encounter: Payer: Self-pay | Admitting: Physical Therapy

## 2021-01-09 NOTE — Therapy (Signed)
[place] [facility] [address] [place], [place], [address] Phone: [phone]   Fax:  [phone]  Physical Therapy Treatment  Patient Details  Name: Curtis Singh MRN: [id number] Date of Birth: 06/13/1955 Referring Provider (PT): Dr. Roland Rack   Encounter Date: 01/08/2021   PT End of Session - 01/09/21 0952     Visit Number 7    Number of Visits 17    Date for PT Re-Evaluation 02/11/21    Authorization - Visit Number 7    Authorization - Number of Visits 10    PT Start Time 9371    PT Stop Time 6967    PT Time Calculation (min) 50 min    Equipment Utilized During Treatment Gait belt    Activity Tolerance Patient limited by pain;Patient tolerated treatment well    Behavior During Therapy [facility] for tasks assessed/performed             Past Medical History:  Diagnosis Date   Aortic atherosclerosis ([place])    Coronary artery disease 12/2019   a.) LHC 12/16/2019: EF 55-65%, LVEDP norm. 99% p-mRCA, 20% mRCA. --> PCI placing a 3.0 x 22 mm Resolute Onyx DES to pRCA.   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    HLD (hyperlipidemia)    Hypertension    Long term current use of antithrombotics/antiplatelets    a.) DAPT (ASA + ticagrelor)   Osteoarthritis    Seasonal allergies    T2DM (type 2 diabetes mellitus) ([place])     Past Surgical History:  Procedure Laterality Date   ARTHOSCOPIC ROTAOR CUFF REPAIR Left 02/25/2018   Procedure: LEFT ARTHROSCOPIC ROTATOR CUFF REPAIR; SUBACROMIAL DECOMPRESSION;  Surgeon: Tania Ade, MD;  [facility];  Service: Orthopedics;  Laterality: Left;   CATARACT EXTRACTION W/PHACO Right 09/03/2016   Procedure: CATARACT EXTRACTION PHACO AND INTRAOCULAR LENS PLACEMENT ([place]) RIGHT DIABETIC;  Surgeon: Leandrew Koyanagi, MD;  Location: [place];  Service: Ophthalmology;  Laterality: Right;  DIABETIC   COLONOSCOPY WITH  PROPOFOL N/A 08/29/2019   Procedure: COLONOSCOPY WITH PROPOFOL;  Surgeon: Lin Landsman, MD;  Location: [facility] ENDOSCOPY;  Service: Gastroenterology;  Laterality: N/A;   CORONARY STENT INTERVENTION N/A 12/16/2019   Procedure: CORONARY STENT INTERVENTION (3.0 x 22 mm Resolute Onyx DES to pRCA);  Surgeon: Wellington Hampshire, MD;  Location: [place] [facility];  Service: Cardiovascular;  Laterality: N/A;   LEFT HEART CATH AND CORONARY ANGIOGRAPHY N/A 12/16/2019   Procedure: LEFT HEART CATH AND CORONARY ANGIOGRAPHY;  Surgeon: Wellington Hampshire, MD;  Location: [place] [facility];  Service: Cardiovascular;  Laterality: N/A;   ROTATOR CUFF REPAIR Right 01/07/2007   TOTAL KNEE ARTHROPLASTY Left 12/13/2020   Procedure: TOTAL KNEE ARTHROPLASTY;  Surgeon: Corky Mull, MD;  Location: [facility];  Service: Orthopedics;  Laterality: Left;   VASECTOMY N/A     There were no vitals filed for this visit.   Subjective Assessment - 01/09/21 0944     Subjective Pt. reports no new complaints.  Pt. has 4 steristrips in place and incision is healing well with slight redness noted at proximal aspect of incision.    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  Pt. known well to skilled PT services.  Pt. has good caregiver assist for daily household tasks/ driving.  Limitations Standing;Walking;House hold activities    Patient Stated Goals Increase L knee AROM/ strength to improve pain-free mobility and return to work.    Currently in Pain? Yes    Pain Score 4     Pain Location Knee    Pain Orientation Left    Pain Type Surgical pain              Therex.:    Nustep L3, seat 10-9 for 10 min. B UE/LE (warm-up).  Discussed pain/ sleeping.      Walking in //-bars:  forward (visual and verbal cueing for L knee extension)/ backwards/ lateral direction.  Mirror feedback/ posture correction.     Seated LAQ,  min. PT assist to  increase L knee extension/ static holds.     Supine contract-relax to increase L knee flexion 5x (pain limited).     Supine SLR/ heel slides/ quad sets (manual feedback)/ marching 10x each.        Manual tx.:   Supine L knee AAROM flexion extension 7x each.  Pain limited at 80 deg.  PROM to 85 deg. (Pain)   Patellar mobs (all planes)- superior/ inferior focus.  Reassessment of incision healing (redness noted at proximal aspect of incision).     STM to L distal quad/ hamstring and proximal gastroc, no TTP, no TPRs noted.   Ice to L knee in supine after tx.         PT Long Term Goals - 12/20/20 1053       PT LONG TERM GOAL #1   Title Pt. will increase FOTO to 45 to improve pain-free moblity with daily tasks.    Baseline FOTO: initial: 12    Time 8    Period Weeks    Status New    Target Date 02/11/21      PT LONG TERM GOAL #2   Title Pt. will increase L knee AROM to 0->115 deg. to improve walking/ stairs/ return to work.    Baseline R knee AROM: -1 to 128 deg. and strength grossly 5/5 MMT. Pt. pain limited with L knee A/PROM: extensino -12/-10 deg., flexion 64/72 deg. (guarded/ pain limted).    Time 8    Period Weeks    Status New    Target Date 02/11/21      PT LONG TERM GOAL #3   Title Pt. will stand from chair with proper technique/ no UE assist to improve pain-free mobility.    Baseline 9/10 L knee pain with sit to stands    Time 8    Period Weeks    Status New    Target Date 02/11/21      PT LONG TERM GOAL #4   Title Pt. will ambulate community distances with normalized gait pattern/ no assistive device to improve return to work.    Baseline Pt. ambulates with limited L LE wt. bearing and heavy UE assist on RW. Limited R LE step length during L LE weight bearing due to pain.    Time 8    Period Weeks    Status New    Target Date 02/11/21      PT LONG TERM GOAL #5   Title Pt. will ascend/ descend stairs with recip. pattern and no handrail to improve  functional mobility.    Baseline TBD    Time 8    Period Weeks    Status New    Target Date 02/11/21  Plan - 01/09/21 0955     Clinical Impression Statement Pt. remains pain limited with all L knee stretches/ AA/PROM. Pt. limited to 85 deg. flexion with L knee A/AROM in supine position due to marked increase c/o pain. Pt. has less tenderness over L distal quad muscle and continued difficulty tolerating L distal hamstring/ knee extension stretches in supine and seated position. Pt. progressing well with L hip/ quad muscle strengthening ex. and improve standing/walking endurance.  Cuing to increase L knee extension/ heel strike to normalize gait/ step length.    Stability/Clinical Decision Making Evolving/Moderate complexity    Clinical Decision Making Moderate    Rehab Potential Good    PT Frequency 2x / week    PT Duration 8 weeks    PT Treatment/Interventions ADLs/Self Care Home Management;Cryotherapy;Electrical Stimulation;Functional mobility training;Therapeutic activities;Therapeutic exercise;Patient/family education;Neuromuscular re-education;Manual techniques;Scar mobilization;Passive range of motion;Stair training;Gait training;DME Instruction;Balance training    PT Next Visit Plan Progress L knee ROM.  Check proximal aspect of incision.    PT Home Exercise Plan Access Code: [email or web address]    [email or web address] and Agree with Plan of Care Patient             Patient will benefit from skilled therapeutic intervention in order to improve the following deficits and impairments:  Decreased mobility, Decreased strength, Impaired flexibility, Hypomobility, Pain, Abnormal gait, Decreased balance, Difficulty walking, Decreased range of motion, Decreased scar mobility, Decreased activity tolerance  Visit Diagnosis: S/P total knee replacement, left  Joint stiffness of knee, left  Muscle weakness (generalized)  Gait difficulty  Acute pain of left knee     Problem  List Patient Active Problem List   Diagnosis Date Noted   Complex tear of medial meniscus of left knee as current injury 08/22/2020   Primary osteoarthritis of left knee 06/06/2020   Left knee pain 03/29/2020   Primary insomnia 03/29/2020   Encounter for medication titration 02/09/2020   Unstable angina (HCC)    Chest discomfort 12/12/2019   History of colonic polyps    Annual physical exam 07/01/2019   ED (erectile dysfunction) of organic origin 02/21/2015   Diabetes mellitus type 2 with complications, uncontrolled 10/18/2014   Hyperlipemia 10/18/2014   Pura Spice, PT, DPT # [phone] 01/09/2021, 10:13 AM  [place] [facility] [address] [place], [place], [address] Phone: [phone]   Fax:  [phone]  Name: Curtis Singh MRN: [id number] Date of Birth: [birth year]

## 2021-01-10 ENCOUNTER — Ambulatory Visit: Payer: No Typology Code available for payment source | Admitting: Physical Therapy

## 2021-01-10 ENCOUNTER — Other Ambulatory Visit: Payer: Self-pay

## 2021-01-10 MED FILL — Metoprolol Succinate Tab ER 24HR 50 MG (Tartrate Equiv): ORAL | 90 days supply | Qty: 90 | Fill #2 | Status: AC

## 2021-01-11 ENCOUNTER — Ambulatory Visit: Payer: No Typology Code available for payment source | Admitting: Physical Therapy

## 2021-01-11 ENCOUNTER — Encounter: Payer: Self-pay | Admitting: Physical Therapy

## 2021-01-11 ENCOUNTER — Other Ambulatory Visit: Payer: Self-pay

## 2021-01-11 DIAGNOSIS — Z96652 Presence of left artificial knee joint: Secondary | ICD-10-CM

## 2021-01-11 DIAGNOSIS — M25662 Stiffness of left knee, not elsewhere classified: Secondary | ICD-10-CM

## 2021-01-11 DIAGNOSIS — M6281 Muscle weakness (generalized): Secondary | ICD-10-CM

## 2021-01-11 DIAGNOSIS — M25562 Pain in left knee: Secondary | ICD-10-CM

## 2021-01-11 DIAGNOSIS — R269 Unspecified abnormalities of gait and mobility: Secondary | ICD-10-CM

## 2021-01-11 NOTE — Therapy (Signed)
[facility] [address] [place], [place], [address] Phone: [phone]   Fax:  [phone]  Physical Therapy Treatment  Patient Details  Name: Curtis Singh MRN: [id number] Date of Birth: 01/26/1955 Referring Provider (PT): Dr. Roland Rack   Encounter Date: 01/11/2021   PT End of Session - 01/11/21 1025     Visit Number 8    Number of Visits 17    Date for PT Re-Evaluation 02/11/21    Authorization - Visit Number 8    Authorization - Number of Visits 10    PT Start Time 2947    PT Stop Time 1120    PT Time Calculation (min) 65 min    Equipment Utilized During Treatment Gait belt    Activity Tolerance Patient limited by pain;Patient tolerated treatment well    Behavior During Therapy [facility] for tasks assessed/performed             Past Medical History:  Diagnosis Date   Aortic atherosclerosis ([place])    Coronary artery disease 12/2019   a.) LHC 12/16/2019: EF 55-65%, LVEDP norm. 99% p-mRCA, 20% mRCA. --> PCI placing a 3.0 x 22 mm Resolute Onyx DES to pRCA.   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    HLD (hyperlipidemia)    Hypertension    Long term current use of antithrombotics/antiplatelets    a.) DAPT (ASA + ticagrelor)   Osteoarthritis    Seasonal allergies    T2DM (type 2 diabetes mellitus) ([place])     Past Surgical History:  Procedure Laterality Date   ARTHOSCOPIC ROTAOR CUFF REPAIR Left 02/25/2018   Procedure: LEFT ARTHROSCOPIC ROTATOR CUFF REPAIR; SUBACROMIAL DECOMPRESSION;  Surgeon: Tania Ade, MD;  Location: [facility];  Service: Orthopedics;  Laterality: Left;   CATARACT EXTRACTION W/PHACO Right 09/03/2016   Procedure: CATARACT EXTRACTION PHACO AND INTRAOCULAR LENS PLACEMENT ([place]) RIGHT DIABETIC;  Surgeon: Leandrew Koyanagi, MD;  Location: [facility];  Service: Ophthalmology;  Laterality: Right;  DIABETIC   COLONOSCOPY WITH  PROPOFOL N/A 08/29/2019   Procedure: COLONOSCOPY WITH PROPOFOL;  Surgeon: Lin Landsman, MD;  [facility];  Service: Gastroenterology;  Laterality: N/A;   CORONARY STENT INTERVENTION N/A 12/16/2019   Procedure: CORONARY STENT INTERVENTION (3.0 x 22 mm Resolute Onyx DES to pRCA);  Surgeon: Wellington Hampshire, MD;  Location: [facility];  Service: Cardiovascular;  Laterality: N/A;   LEFT HEART CATH AND CORONARY ANGIOGRAPHY N/A 12/16/2019   Procedure: LEFT HEART CATH AND CORONARY ANGIOGRAPHY;  Surgeon: Wellington Hampshire, MD;  Location: [place] [facility];  Service: Cardiovascular;  Laterality: N/A;   ROTATOR CUFF REPAIR Right 01/07/2007   TOTAL KNEE ARTHROPLASTY Left 12/13/2020   Procedure: TOTAL KNEE ARTHROPLASTY;  Surgeon: Corky Mull, MD;  Location: [facility];  Service: Orthopedics;  Laterality: Left;   VASECTOMY N/A     There were no vitals filed for this visit.   Subjective Assessment - 01/11/21 1128     Subjective Patient reports difficulty with sleeping, disturbed sleep after about 1.5 hours and having to go to recliner. He reports aching in surgical knee at night. He is concerned with ongoing swelling s/p TKA and decreased range of motion of his post-operative knee. 3-4/10 pain at arrival with pt using pain medication; he also used ice at home prior to PT.    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 Pt. 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  Pt. known well to skilled PT services.  Pt. has good caregiver assist for daily household tasks/ driving.    Limitations Standing;Walking;House hold activities    Patient Stated Goals Increase L knee AROM/ strength to improve pain-free mobility and return to work.    Currently in Pain? Yes    Pain Score 4                OBJECTIVE FINDINGS  Observation No signs of infection noted today   Edema Swelling L/R: joint line (41/37.1 cm). Mid-gastroc (35.8/35 cm.), distal quad  (43/ 40.5 cm).    ROM Knee PROM -11-86  Active knee extension to -4 obtained with quad set      TREATMENT     Therex.:    Nustep L3, seat 10-9 for 10 min. B UE/LE (warm-up and knee AAROM).  Discussed pain control and strategies for comfort at night - unbilled time  Heel slide; x5, 10 second hold    Seated LAQ,  min. PT assist to increase L knee extension/ static holds   Supine contract-relax to increase L knee flexion 5x with 5 sec agonist contraction (pain limited).     Supine SLR.quad sets (manual feedback)/ marching 10x each.      *not today* Walking in //-bars:  forward (visual and verbal cueing for L knee extension)/ backwards/ lateral direction.  Mirror feedback/ posture correction.       Manual tx.:   Supine L knee PROM flexion and extension within patient tolerance, performed in supine and seated position with gentle overpressure at end-range as able   Patellar mobs (all planes)- superior/ inferior focus.  Reassessment of incision healing (redness noted at proximal aspect of incision).     STM to L distal quad/ hamstring and proximal gastroc, no TTP, no TPRs noted.     Ice to L knee in supine after tx, x 10 minutes - for analgesic and anti-inflammatory effect     PT Long Term Goals - 12/20/20 1053       PT LONG TERM GOAL #1   Title Pt. will increase FOTO to 45 to improve pain-free moblity with daily tasks.    Baseline FOTO: initial: 12    Time 8    Period Weeks    Status New    Target Date 02/11/21      PT LONG TERM GOAL #2   Title Pt. will increase L knee AROM to 0->115 deg. to improve walking/ stairs/ return to work.    Baseline R knee AROM: -1 to 128 deg. and strength grossly 5/5 MMT. Pt. pain limited with L knee A/PROM: extensino -12/-10 deg., flexion 64/72 deg. (guarded/ pain limted).    Time 8    Period Weeks    Status New    Target Date 02/11/21      PT LONG TERM GOAL #3   Title Pt. will stand from chair with proper technique/ no UE assist  to improve pain-free mobility.    Baseline 9/10 L knee pain with sit to stands    Time 8    Period Weeks    Status New    Target Date 02/11/21      PT LONG TERM GOAL #4   Title Pt. will ambulate community distances with normalized gait pattern/ no assistive device to improve return to work.    Baseline Pt. ambulates with limited L LE wt. bearing and heavy UE assist on  RW. Limited R LE step length during L LE weight bearing due to pain.    Time 8    Period Weeks    Status New    Target Date 02/11/21      PT LONG TERM GOAL #5   Title Pt. will ascend/ descend stairs with recip. pattern and no handrail to improve functional mobility.    Baseline TBD    Time 8    Period Weeks    Status New    Target Date 02/11/21                   Plan - 01/11/21 1132     Clinical Impression Statement Patient demonstrates gradually improving active knee extension. He is only able to attain 86 deg knee flexion with use of manual stretching and PROM with end-range overpressure in sitting over edge of bed; difference from last visit is not clinically significant. Patient is still significantly limited by pain with attaining further knee flexion ROM. He exhibits fair quad set and is able to perform SLR without major extensor lag. Encouraged continued aggressive work on ROM at home for flexion and extension.    Stability/Clinical Decision Making Evolving/Moderate complexity    Rehab Potential Good    PT Frequency 2x / week    PT Duration 8 weeks    PT Treatment/Interventions ADLs/Self Care Home Management;Cryotherapy;Electrical Stimulation;Functional mobility training;Therapeutic activities;Therapeutic exercise;Patient/family education;Neuromuscular re-education;Manual techniques;Scar mobilization;Passive range of motion;Stair training;Gait training;DME Instruction;Balance training    PT Next Visit Plan Progress L knee ROM.  Continue follow-up on signs of infection along incision    PT Home Exercise  Plan Access Code: [email or web address]    Consulted and Agree with Plan of Care Patient             Patient will benefit from skilled therapeutic intervention in order to improve the following deficits and impairments:  Decreased mobility, Decreased strength, Impaired flexibility, Hypomobility, Pain, Abnormal gait, Decreased balance, Difficulty walking, Decreased range of motion, Decreased scar mobility, Decreased activity tolerance  Visit Diagnosis: S/P total knee replacement, left  Joint stiffness of knee, left  Muscle weakness (generalized)  Gait difficulty  Acute pain of left knee     Problem List Patient Active Problem List   Diagnosis Date Noted   Complex tear of medial meniscus of left knee as current injury 08/22/2020   Primary osteoarthritis of left knee 06/06/2020   Left knee pain 03/29/2020   Primary insomnia 03/29/2020   Encounter for medication titration 02/09/2020   Unstable angina (HCC)    Chest discomfort 12/12/2019   History of colonic polyps    Annual physical exam 07/01/2019   ED (erectile dysfunction) of organic origin 02/21/2015   Diabetes mellitus type 2 with complications, uncontrolled 10/18/2014   Hyperlipemia 10/18/2014   Valentina Gu, PT, DPT #[id number]  Eilleen Kempf, PT 01/11/2021, 11:43 AM  [facility] [address] [place], [place], [address] Phone: [phone]   Fax:  [phone]  Name: Curtis Singh MRN: [id number] Date of Birth: [birth year]

## 2021-01-14 ENCOUNTER — Other Ambulatory Visit: Payer: Self-pay

## 2021-01-14 ENCOUNTER — Encounter: Payer: Self-pay | Admitting: Physical Therapy

## 2021-01-14 ENCOUNTER — Ambulatory Visit: Payer: No Typology Code available for payment source | Admitting: Physical Therapy

## 2021-01-14 DIAGNOSIS — M25562 Pain in left knee: Secondary | ICD-10-CM

## 2021-01-14 DIAGNOSIS — M6281 Muscle weakness (generalized): Secondary | ICD-10-CM

## 2021-01-14 DIAGNOSIS — Z96652 Presence of left artificial knee joint: Secondary | ICD-10-CM | POA: Diagnosis not present

## 2021-01-14 DIAGNOSIS — R269 Unspecified abnormalities of gait and mobility: Secondary | ICD-10-CM

## 2021-01-14 DIAGNOSIS — M25662 Stiffness of left knee, not elsewhere classified: Secondary | ICD-10-CM

## 2021-01-14 MED ORDER — TRAMADOL HCL 50 MG PO TABS
50.0000 mg | ORAL_TABLET | Freq: Four times a day (QID) | ORAL | 0 refills | Status: DC | PRN
  Filled 2021-01-14: qty 30, 8d supply, fill #0

## 2021-01-14 NOTE — Therapy (Signed)
[facility] [address] [place], [place], [address] Phone: [phone]   Fax:  [phone]  Physical Therapy Treatment  Patient Details  Name: Curtis Singh MRN: [id number] Date of Birth: 01/13/1955 Referring Provider (PT): Dr. Roland Rack   Encounter Date: 01/14/2021   PT End of Session - 01/14/21 1130     Visit Number 9    Number of Visits 17    Date for PT Re-Evaluation 02/11/21    Authorization - Visit Number 9    Authorization - Number of Visits 10    PT Start Time 7371    PT Stop Time 0626    PT Time Calculation (min) 64 min    Equipment Utilized During Treatment --    Activity Tolerance Patient limited by pain;Patient tolerated treatment well    Behavior During Therapy [facility] for tasks assessed/performed             Past Medical History:  Diagnosis Date   Aortic atherosclerosis ([place])    Coronary artery disease 12/2019   a.) LHC 12/16/2019: EF 55-65%, LVEDP norm. 99% p-mRCA, 20% mRCA. --> PCI placing a 3.0 x 22 mm Resolute Onyx DES to pRCA.   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    HLD (hyperlipidemia)    Hypertension    Long term current use of antithrombotics/antiplatelets    a.) DAPT (ASA + ticagrelor)   Osteoarthritis    Seasonal allergies    T2DM (type 2 diabetes mellitus) ([place])     Past Surgical History:  Procedure Laterality Date   ARTHOSCOPIC ROTAOR CUFF REPAIR Left 02/25/2018   Procedure: LEFT ARTHROSCOPIC ROTATOR CUFF REPAIR; SUBACROMIAL DECOMPRESSION;  Surgeon: Tania Ade, MD;  [facility];  Service: Orthopedics;  Laterality: Left;   CATARACT EXTRACTION W/PHACO Right 09/03/2016   Procedure: CATARACT EXTRACTION PHACO AND INTRAOCULAR LENS PLACEMENT ([place]) RIGHT DIABETIC;  Surgeon: Leandrew Koyanagi, MD;  Location: [facility];  Service: Ophthalmology;  Laterality: Right;  DIABETIC   COLONOSCOPY WITH PROPOFOL  N/A 08/29/2019   Procedure: COLONOSCOPY WITH PROPOFOL;  Surgeon: Lin Landsman, MD;  Location: [facility] ENDOSCOPY;  Service: Gastroenterology;  Laterality: N/A;   CORONARY STENT INTERVENTION N/A 12/16/2019   Procedure: CORONARY STENT INTERVENTION (3.0 x 22 mm Resolute Onyx DES to pRCA);  Surgeon: Wellington Hampshire, MD;  Location: [facility];  Service: Cardiovascular;  Laterality: N/A;   LEFT HEART CATH AND CORONARY ANGIOGRAPHY N/A 12/16/2019   Procedure: LEFT HEART CATH AND CORONARY ANGIOGRAPHY;  Surgeon: Wellington Hampshire, MD;  Location: [place] [facility];  Service: Cardiovascular;  Laterality: N/A;   ROTATOR CUFF REPAIR Right 01/07/2007   TOTAL KNEE ARTHROPLASTY Left 12/13/2020   Procedure: TOTAL KNEE ARTHROPLASTY;  Surgeon: Corky Mull, MD;  Location: [facility];  Service: Orthopedics;  Laterality: Left;   VASECTOMY N/A     There were no vitals filed for this visit.   Subjective Assessment - 01/14/21 1129     Subjective Pt. reports increase L knee discomfort at night time.  Pt. reports walking up several times at night to reposition knee.  Pt. reports 2-4/10 L knee pain prior/ during tx. session.  Pt. drove to PT for 1st time.    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  Pt. known well to skilled PT services.  Pt. has good caregiver assist for daily household tasks/ driving.    Limitations Standing;Walking;House hold activities    Patient Stated Goals Increase L knee AROM/ strength to improve pain-free mobility and return to work.    Currently in Pain? Yes    Pain Score 4     Pain Location Knee    Pain Orientation Left    Pain Descriptors / Indicators Constant              Therex.:    Nustep L4, seat 10-8 for 12 min. B UE/LE (warm-up/ non-billed).  Discussed pain/ sleeping.    2nd step knee flexion/ hamstring stretches/ gastroc stretches 8x each.      Walking in //-bars:  forward  (visual and verbal cueing for L knee extension)/ lateral direction.  Mirror feedback/ posture correction).  Walking in //-bars/ clinic with use of SPC.    TG knee flexion (static holds)- pain limited.   Seated LAQ,  min. PT assist to increase L knee extension/ static holds.     Seated contract-relax to increase L knee flexion 5x (pain limited).          Manual tx.:   Supine L knee AAROM flexion extension 5x each.  Pain limited at <90 deg.   Patellar mobs (all planes)- superior/ inferior focus.  Reassessment of incision healing (scabs/ dry skin present)- decrease proximal incision redness   STM to L distal quad/ hamstring and proximal gastroc, no TTP, no TPRs noted.     Ice to L knee at home.       PT Long Term Goals - 12/20/20 1053       PT LONG TERM GOAL #1   Title Pt. will increase FOTO to 45 to improve pain-free moblity with daily tasks.    Baseline FOTO: initial: 12    Time 8    Period Weeks    Status New    Target Date 02/11/21      PT LONG TERM GOAL #2   Title Pt. will increase L knee AROM to 0->115 deg. to improve walking/ stairs/ return to work.    Baseline R knee AROM: -1 to 128 deg. and strength grossly 5/5 MMT. Pt. pain limited with L knee A/PROM: extensino -12/-10 deg., flexion 64/72 deg. (guarded/ pain limted).    Time 8    Period Weeks    Status New    Target Date 02/11/21      PT LONG TERM GOAL #3   Title Pt. will stand from chair with proper technique/ no UE assist to improve pain-free mobility.    Baseline 9/10 L knee pain with sit to stands    Time 8    Period Weeks    Status New    Target Date 02/11/21      PT LONG TERM GOAL #4   Title Pt. will ambulate community distances with normalized gait pattern/ no assistive device to improve return to work.    Baseline Pt. ambulates with limited L LE wt. bearing and heavy UE assist on RW. Limited R LE step length during L LE weight bearing due to pain.    Time 8    Period Weeks    Status New     Target Date 02/11/21      PT LONG TERM GOAL #5   Title Pt. will ascend/ descend stairs with recip. pattern and no handrail to improve functional mobility.    Baseline TBD    Time 8    Period  Weeks    Status New    Target Date 02/11/21                   Plan - 01/14/21 1131     Clinical Impression Statement Pt. remains pain limited with L knee A/AROM in seated and supine positions.  Pt. starting to progress to use of SPC on R with 2-point gait.  Pts. gait is antalgic with limited R LE step length/ pattern/ heel strike as compared to L.  Pt. understands the importance of increasing knee extension/flexion and PT discussed a goal of increasing range by 1-2 degrees/ day. Pt. remains <90 deg. of flexion in seated posture after manual stretches. Good incision healing with scabs and dry skin noted.  Pt. will continue to benefit from skilled PT services to increase L knee ROM/ stability to imrprove pain-free mobility/ return to work.    Stability/Clinical Decision Making Evolving/Moderate complexity    Clinical Decision Making Moderate    Rehab Potential Good    PT Frequency 2x / week    PT Duration 8 weeks    PT Treatment/Interventions ADLs/Self Care Home Management;Cryotherapy;Electrical Stimulation;Functional mobility training;Therapeutic activities;Therapeutic exercise;Patient/family education;Neuromuscular re-education;Manual techniques;Scar mobilization;Passive range of motion;Stair training;Gait training;DME Instruction;Balance training    PT Next Visit Plan Progress L knee ROM.  CHECK A/PROM    PT Home Exercise Plan Access Code: [email or web address]    Consulted and Agree with Plan of Care Patient             Patient will benefit from skilled therapeutic intervention in order to improve the following deficits and impairments:  Decreased mobility, Decreased strength, Impaired flexibility, Hypomobility, Pain, Abnormal gait, Decreased balance, Difficulty walking, Decreased range of motion,  Decreased scar mobility, Decreased activity tolerance  Visit Diagnosis: S/P total knee replacement, left  Joint stiffness of knee, left  Muscle weakness (generalized)  Gait difficulty  Acute pain of left knee     Problem List Patient Active Problem List   Diagnosis Date Noted   Complex tear of medial meniscus of left knee as current injury 08/22/2020   Primary osteoarthritis of left knee 06/06/2020   Left knee pain 03/29/2020   Primary insomnia 03/29/2020   Encounter for medication titration 02/09/2020   Unstable angina (HCC)    Chest discomfort 12/12/2019   History of colonic polyps    Annual physical exam 07/01/2019   ED (erectile dysfunction) of organic origin 02/21/2015   Diabetes mellitus type 2 with complications, uncontrolled 10/18/2014   Hyperlipemia 10/18/2014   Pura Spice, PT, DPT # [id number] 01/14/2021, 11:36 AM  [place] [address] [place], [place], [address] Phone: [phone]   Fax:  [phone]  Name: TRAVON CROCHET MRN: [id number] Date of Birth: 07/03/1955

## 2021-01-16 ENCOUNTER — Other Ambulatory Visit: Payer: Self-pay

## 2021-01-16 ENCOUNTER — Ambulatory Visit: Payer: No Typology Code available for payment source | Admitting: Physical Therapy

## 2021-01-16 ENCOUNTER — Encounter: Payer: Self-pay | Admitting: Physical Therapy

## 2021-01-16 DIAGNOSIS — M25662 Stiffness of left knee, not elsewhere classified: Secondary | ICD-10-CM

## 2021-01-16 DIAGNOSIS — Z96652 Presence of left artificial knee joint: Secondary | ICD-10-CM | POA: Diagnosis not present

## 2021-01-16 DIAGNOSIS — M25562 Pain in left knee: Secondary | ICD-10-CM

## 2021-01-16 DIAGNOSIS — R269 Unspecified abnormalities of gait and mobility: Secondary | ICD-10-CM

## 2021-01-16 DIAGNOSIS — M6281 Muscle weakness (generalized): Secondary | ICD-10-CM

## 2021-01-16 NOTE — Therapy (Signed)
[facility] [address] [place], [place], [address] Phone: [phone]   Fax:  [phone]  Physical Therapy Treatment  Patient Details  Name: Curtis Singh MRN: [id number] Date of Birth: 07-21-1955 Referring Provider (PT): Dr. Roland Rack   Encounter Date: 01/16/2021   PT End of Session - 01/16/21 1023     Visit Number 10    Number of Visits 17    Date for PT Re-Evaluation 02/11/21    Authorization - Visit Number 10    Authorization - Number of Visits 10    PT Start Time 5498    PT Stop Time 1119    PT Time Calculation (min) 63 min    Activity Tolerance Patient limited by pain;Patient tolerated treatment well    Behavior During Therapy [facility] for tasks assessed/performed             Past Medical History:  Diagnosis Date   Aortic atherosclerosis ([place])    Coronary artery disease 12/2019   a.) LHC 12/16/2019: EF 55-65%, LVEDP norm. 99% p-mRCA, 20% mRCA. --> PCI placing a 3.0 x 22 mm Resolute Onyx DES to pRCA.   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    HLD (hyperlipidemia)    Hypertension    Long term current use of antithrombotics/antiplatelets    a.) DAPT (ASA + ticagrelor)   Osteoarthritis    Seasonal allergies    T2DM (type 2 diabetes mellitus) ([place])     Past Surgical History:  Procedure Laterality Date   ARTHOSCOPIC ROTAOR CUFF REPAIR Left 02/25/2018   Procedure: LEFT ARTHROSCOPIC ROTATOR CUFF REPAIR; SUBACROMIAL DECOMPRESSION;  Surgeon: Tania Ade, MD;  [facility]: [facility];  Service: Orthopedics;  Laterality: Left;   CATARACT EXTRACTION W/PHACO Right 09/03/2016   Procedure: CATARACT EXTRACTION PHACO AND INTRAOCULAR LENS PLACEMENT ([place]) RIGHT DIABETIC;  Surgeon: Leandrew Koyanagi, MD;  Location: [place];  Service: Ophthalmology;  Laterality: Right;  DIABETIC   COLONOSCOPY WITH PROPOFOL N/A 08/29/2019   Procedure:  COLONOSCOPY WITH PROPOFOL;  Surgeon: Lin Landsman, MD;  Location: [facility];  Service: Gastroenterology;  Laterality: N/A;   CORONARY STENT INTERVENTION N/A 12/16/2019   Procedure: CORONARY STENT INTERVENTION (3.0 x 22 mm Resolute Onyx DES to pRCA);  Surgeon: Wellington Hampshire, MD;  Location: [place] [facility];  Service: Cardiovascular;  Laterality: N/A;   LEFT HEART CATH AND CORONARY ANGIOGRAPHY N/A 12/16/2019   Procedure: LEFT HEART CATH AND CORONARY ANGIOGRAPHY;  Surgeon: Wellington Hampshire, MD;  Location: [place] [facility];  Service: Cardiovascular;  Laterality: N/A;   ROTATOR CUFF REPAIR Right 01/07/2007   TOTAL KNEE ARTHROPLASTY Left 12/13/2020   Procedure: TOTAL KNEE ARTHROPLASTY;  Surgeon: Corky Mull, MD;  Location: [facility];  Service: Orthopedics;  Laterality: Left;   VASECTOMY N/A     There were no vitals filed for this visit.   Subjective Assessment - 01/16/21 1022     Subjective Pt. reports 2-3/10 L knee pain this morning.  Pt. entered PT with use of SPC and pt. driving with no issues.  Pt. states he has slept much better the past 2 nights.    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 Pt. 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  Pt. known well to skilled PT services.  Pt. has good caregiver assist for daily household tasks/ driving.    Limitations  Standing;Walking;House hold activities    Patient Stated Goals Increase L knee AROM/ strength to improve pain-free mobility and return to work.    Currently in Pain? Yes    Pain Score 3     Pain Location Knee    Pain Orientation Left    Pain Descriptors / Indicators Constant    Pain Type Surgical pain              Therex.:    Nustep L4, seat 10-7 for 12 min. B UE/LE (warm-up/ non-billed).  Discussed pain/ sleeping.    Walking in //-bars:  forward/backward (visual and verbal cueing for L knee extension)/ lateral direction.  Mirror feedback/ posture correction).  Walking  in //-bars/ clinic with use of SPC.   SEE UPDATE HEP  Standing hamstring curl in //-bars 2 sets x10 Mini squats in //-bars 2x 10 Gastroc stretch at stairs 3x 20 secs each leg  Supine quad set 2x10  Supine glute bridge 2x10  Prone leg hang (pain limited)   TG knee flexion (static holds)- pain limited.   Ascending/descending stairs with use of R UE on handrail.       Manual tx.:   Supine L knee AAROM flexion extension 5x each.  Pain limited at <90 deg.   Patellar mobs (all planes)- superior/ inferior focus.  Reassessment of incision healing (scabs/ dry skin present)- decrease proximal incision redness   STM to L distal quad/ hamstring and proximal gastroc, no TTP, no TPRs noted.     Ice to L knee at home.       PT Long Term Goals - 01/16/21 1122       PT LONG TERM GOAL #1   Title Pt. will increase FOTO to 45 to improve pain-free moblity with daily tasks.    Baseline FOTO: initial: 12.  1/11: 56    Time 8    Period Weeks    Status Achieved    Target Date 01/16/21      PT LONG TERM GOAL #2   Title Pt. will increase L knee AROM to 0->115 deg. to improve walking/ stairs/ return to work.    Baseline R knee AROM: -1 to 128 deg. and strength grossly 5/5 MMT. Pt. pain limited with L knee A/PROM: extensino -12/-10 deg., flexion 64/72 deg. (guarded/ pain limted).  1/11: supine L knee (-11 to 86 deg.),  seated L knee flexion 88 deg.    Time 8    Period Weeks    Status Not Met    Target Date 02/11/21      PT LONG TERM GOAL #3   Title Pt. will stand from chair with proper technique/ no UE assist to improve pain-free mobility.    Baseline 9/10 L knee pain with sit to stands    Time 8    Period Weeks    Status Partially Met    Target Date 02/11/21      PT LONG TERM GOAL #4   Title Pt. will ambulate community distances with normalized gait pattern/ no assistive device to improve return to work.    Baseline Pt. ambulates with limited L LE wt. bearing and heavy UE assist on  RW. Limited R LE step length during L LE weight bearing due to pain.    Time 8    Period Weeks    Status Partially Met    Target Date 02/11/21      PT LONG TERM GOAL #5   Title Pt. will ascend/ descend stairs with  recip. pattern and no handrail to improve functional mobility.    Baseline Heavy UE assist with recip. gait pattern    Time 8    Period Weeks    Status Partially Met    Target Date 02/11/21                 Plan - 01/16/21 1023     Clinical Impression Statement Seated L knee AAROM flexion 91 deg. (pain).  Pt. continues use of SPC with 2-point gait. Pt. gait is antalgic from pain and limitations with knee extension (lacking 10 deg.). Pt. has limited ROM in both knee flexion and extension due to pain and swelling. Pt. is able to perform ther. ex but does need cuing to WB bilaterally. Pt does understand the need to work on AROM but pain is the main limiting factor. Pt. will continue with updated HEP to continue to work on mobility.  See updated HEP and PT goals.    Stability/Clinical Decision Making Evolving/Moderate complexity    Clinical Decision Making Moderate    Rehab Potential Good    PT Frequency 2x / week    PT Duration 8 weeks    PT Treatment/Interventions ADLs/Self Care Home Management;Cryotherapy;Electrical Stimulation;Functional mobility training;Therapeutic activities;Therapeutic exercise;Patient/family education;Neuromuscular re-education;Manual techniques;Scar mobilization;Passive range of motion;Stair training;Gait training;DME Instruction;Balance training    PT Next Visit Plan Progress L knee ROM.    PT Home Exercise Plan Access Code: [email or web address]    [email or web address] and Agree with Plan of Care Patient             Patient will benefit from skilled therapeutic intervention in order to improve the following deficits and impairments:  Decreased mobility, Decreased strength, Impaired flexibility, Hypomobility, Pain, Abnormal gait, Decreased balance, Difficulty  walking, Decreased range of motion, Decreased scar mobility, Decreased activity tolerance  Visit Diagnosis: S/P total knee replacement, left  Joint stiffness of knee, left  Muscle weakness (generalized)  Gait difficulty  Acute pain of left knee     Problem List Patient Active Problem List   Diagnosis Date Noted   Complex tear of medial meniscus of left knee as current injury 08/22/2020   Primary osteoarthritis of left knee 06/06/2020   Left knee pain 03/29/2020   Primary insomnia 03/29/2020   Encounter for medication titration 02/09/2020   Unstable angina ([facility])    Chest discomfort 12/12/2019   History of colonic polyps    Annual physical exam 07/01/2019   ED (erectile dysfunction) of organic origin 02/21/2015   Diabetes mellitus type 2 with complications, uncontrolled 10/18/2014   Hyperlipemia 10/18/2014   Pura Spice, PT, DPT # [id number] 01/16/2021, 12:52 PM  [facility] [address] [place], [place], [address] Phone: [phone]   Fax:  [phone]  Name: ISEAH PLOUFF MRN: [id number] Date of Birth: Oct 19, 1955

## 2021-01-21 ENCOUNTER — Other Ambulatory Visit: Payer: Self-pay

## 2021-01-21 ENCOUNTER — Ambulatory Visit: Payer: No Typology Code available for payment source | Admitting: Physical Therapy

## 2021-01-21 ENCOUNTER — Encounter: Payer: Self-pay | Admitting: Physical Therapy

## 2021-01-21 DIAGNOSIS — Z96652 Presence of left artificial knee joint: Secondary | ICD-10-CM

## 2021-01-21 DIAGNOSIS — M25662 Stiffness of left knee, not elsewhere classified: Secondary | ICD-10-CM

## 2021-01-21 DIAGNOSIS — M25562 Pain in left knee: Secondary | ICD-10-CM

## 2021-01-21 DIAGNOSIS — R269 Unspecified abnormalities of gait and mobility: Secondary | ICD-10-CM

## 2021-01-21 NOTE — Therapy (Addendum)
[name] [address] [place], [place], [address] Phone: [phone]   Fax:  [phone]  Physical Therapy Treatment  Patient Details  Name: Curtis Singh MRN: [id number] Date of Birth: 02/25/1955 Referring Provider (PT): Dr. Roland Rack   Encounter Date: 01/21/2021   PT End of Session - 01/21/21 1034     Visit Number 11    Number of Visits 17    Date for PT Re-Evaluation 02/11/21    Authorization - Visit Number 1    Authorization - Number of Visits 10    PT Start Time 8372    PT Stop Time 9021    PT Time Calculation (min) 64 min    Activity Tolerance Patient limited by pain;Patient tolerated treatment well    Behavior During Therapy [facility] for tasks assessed/performed             Past Medical History:  Diagnosis Date   Aortic atherosclerosis ([place])    Coronary artery disease 12/2019   a.) LHC 12/16/2019: EF 55-65%, LVEDP norm. 99% p-mRCA, 20% mRCA. --> PCI placing a 3.0 x 22 mm Resolute Onyx DES to pRCA.   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    HLD (hyperlipidemia)    Hypertension    Long term current use of antithrombotics/antiplatelets    a.) DAPT (ASA + ticagrelor)   Osteoarthritis    Seasonal allergies    T2DM (type 2 diabetes mellitus) ([place])     Past Surgical History:  Procedure Laterality Date   ARTHOSCOPIC ROTAOR CUFF REPAIR Left 02/25/2018   Procedure: LEFT ARTHROSCOPIC ROTATOR CUFF REPAIR; SUBACROMIAL DECOMPRESSION;  Surgeon: Tania Ade, MD;  [facility];  Service: Orthopedics;  Laterality: Left;   CATARACT EXTRACTION W/PHACO Right 09/03/2016   Procedure: CATARACT EXTRACTION PHACO AND INTRAOCULAR LENS PLACEMENT ([place]) RIGHT DIABETIC;  Surgeon: Leandrew Koyanagi, MD;  Location: [place];  Service: Ophthalmology;  Laterality: Right;  DIABETIC   COLONOSCOPY WITH PROPOFOL N/A 08/29/2019   Procedure: COLONOSCOPY  WITH PROPOFOL;  Surgeon: Lin Landsman, MD;  Location: [facility];  Service: Gastroenterology;  Laterality: N/A;   CORONARY STENT INTERVENTION N/A 12/16/2019   Procedure: CORONARY STENT INTERVENTION (3.0 x 22 mm Resolute Onyx DES to pRCA);  Surgeon: Wellington Hampshire, MD;  Location: [place] [facility];  Service: Cardiovascular;  Laterality: N/A;   LEFT HEART CATH AND CORONARY ANGIOGRAPHY N/A 12/16/2019   Procedure: LEFT HEART CATH AND CORONARY ANGIOGRAPHY;  Surgeon: Wellington Hampshire, MD;  Location: [place] [facility];  Service: Cardiovascular;  Laterality: N/A;   ROTATOR CUFF REPAIR Right 01/07/2007   TOTAL KNEE ARTHROPLASTY Left 12/13/2020   Procedure: TOTAL KNEE ARTHROPLASTY;  Surgeon: Corky Mull, MD;  Location: [facility];  Service: Orthopedics;  Laterality: Left;   VASECTOMY N/A     There were no vitals filed for this visit.   Subjective Assessment - 01/21/21 1030     Subjective Pt. reports 5/10 L knee pain this morning.  Pt. entered PT with use of SPC.  Pt. states he has increase pain and swelling from a busy weekend working on cars. Pt. states the increase in pain has been leading to difficulty sleeping and getting comfortable while resting.    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 Pt. 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  Pt. known well to skilled PT services.  Pt. has good caregiver assist for daily household tasks/ driving.    Limitations Standing;Walking;House hold activities    Patient Stated Goals Increase L knee AROM/ strength to improve pain-free mobility and return to work.    Currently in Pain? Yes    Pain Score 5     Pain Location Knee    Pain Orientation Left                Therex.:    Nustep L4, seat 10-7 for 12 min. B UE/LE (warm-up/ non-billed).  Discussed pain/ sleeping.     Walking in //-bars:  forward/backward (visual and verbal cueing for L knee extension)/ lateral direction with R UE assistance.   Mirror feedback/ posture correction).   Walking marches in //-bars 4x  SLS in //-bars 3x20seconds. Some increase in pain with L LE.  Mini squats in //-bars 2x 10  Gastroc stretch at stairs 3x 20 secs each leg  Reciprocal gait stepping up and down stairs  Supine quad set 2x10 Supine glute bridge 2x10  TG knee flexion (static holds)- pain limited.  TG calf raises 2x10   Seated contract relax 2x5 (PROM knee flexion 94 degrees sitting off table)     Manual tx.:  No charge   Supine L knee AAROM flexion 5x each.  Pain limited at <90 deg.   Ice to L knee at home.      PT Long Term Goals - 01/16/21 1122       PT LONG TERM GOAL #1   Title Pt. will increase FOTO to 45 to improve pain-free moblity with daily tasks.    Baseline FOTO: initial: 12.  1/11: 56    Time 8    Period Weeks    Status Achieved    Target Date 01/16/21      PT LONG TERM GOAL #2   Title Pt. will increase L knee AROM to 0->115 deg. to improve walking/ stairs/ return to work.    Baseline R knee AROM: -1 to 128 deg. and strength grossly 5/5 MMT. Pt. pain limited with L knee A/PROM: extensino -12/-10 deg., flexion 64/72 deg. (guarded/ pain limted).  1/11: supine L knee (-11 to 86 deg.),  seated L knee flexion 88 deg.    Time 8    Period Weeks    Status Not Met    Target Date 02/11/21      PT LONG TERM GOAL #3   Title Pt. will stand from chair with proper technique/ no UE assist to improve pain-free mobility.    Baseline 9/10 L knee pain with sit to stands    Time 8    Period Weeks    Status Partially Met    Target Date 02/11/21      PT LONG TERM GOAL #4   Title Pt. will ambulate community distances with normalized gait pattern/ no assistive device to improve return to work.    Baseline Pt. ambulates with limited L LE wt. bearing and heavy UE assist on RW. Limited R LE step length during L LE weight bearing due to pain.    Time 8    Period Weeks    Status Partially Met    Target Date 02/11/21       PT LONG TERM GOAL #5   Title Pt. will ascend/ descend stairs with recip. pattern and no handrail to improve functional mobility.    Baseline Heavy UE assist with recip. gait pattern    Time 8    Period  Weeks    Status Partially Met    Target Date 02/11/21                   Plan - 01/21/21 1135     Clinical Impression Statement Pt. states increase in pain and swelling in L knee. Pt. is progressing with SPC and is able to ambulate with a more consistent gait pattern. Pt. still has ROM limitations in knee flexion >90 deg. and extension. Pt. was able to hit 94 degrees of seated PROM of left knee flexion with no increase in pain. Pt. will continue to benefit from strengthening exercises as well as hands on stretching by the PT to increase his knee ROM. Pt will continue to work on HEP so that his PT sessions can continue to work on more dynamic gait exercises as well as increasing his ROM.    Stability/Clinical Decision Making Evolving/Moderate complexity    Clinical Decision Making Moderate    Rehab Potential Good    PT Frequency 2x / week    PT Duration 8 weeks    PT Treatment/Interventions ADLs/Self Care Home Management;Cryotherapy;Electrical Stimulation;Functional mobility training;Therapeutic activities;Therapeutic exercise;Patient/family education;Neuromuscular re-education;Manual techniques;Scar mobilization;Passive range of motion;Stair training;Gait training;DME Instruction;Balance training    PT Next Visit Plan Progress L knee ROM.  SEND MD UPDATE ROM prior to f/u.    PT Home Exercise Plan Access Code: [email or web address]    Consulted and Agree with Plan of Care Patient             Patient will benefit from skilled therapeutic intervention in order to improve the following deficits and impairments:  Decreased mobility, Decreased strength, Impaired flexibility, Hypomobility, Pain, Abnormal gait, Decreased balance, Difficulty walking, Decreased range of motion, Decreased scar mobility,  Decreased activity tolerance  Visit Diagnosis: S/P total knee replacement, left  Joint stiffness of knee, left  Gait difficulty  Acute pain of left knee     Problem List Patient Active Problem List   Diagnosis Date Noted   Complex tear of medial meniscus of left knee as current injury 08/22/2020   Primary osteoarthritis of left knee 06/06/2020   Left knee pain 03/29/2020   Primary insomnia 03/29/2020   Encounter for medication titration 02/09/2020   Unstable angina ([facility])    Chest discomfort 12/12/2019   History of colonic polyps    Annual physical exam 07/01/2019   ED (erectile dysfunction) of organic origin 02/21/2015   Diabetes mellitus type 2 with complications, uncontrolled 10/18/2014   Hyperlipemia 10/18/2014   Pura Spice, PT, DPT # [place], SPT 01/21/2021, 12:24 PM  [facility] [address] [place], [place], [address] Phone: [phone]   Fax:  [phone]  Name: SAIGE BUSBY MRN: [id number] Date of Birth: January 22, 1955

## 2021-01-23 ENCOUNTER — Other Ambulatory Visit: Payer: Self-pay

## 2021-01-23 ENCOUNTER — Ambulatory Visit: Payer: No Typology Code available for payment source | Admitting: Physical Therapy

## 2021-01-23 ENCOUNTER — Encounter: Payer: Self-pay | Admitting: Physical Therapy

## 2021-01-23 DIAGNOSIS — Z96652 Presence of left artificial knee joint: Secondary | ICD-10-CM

## 2021-01-23 DIAGNOSIS — M25562 Pain in left knee: Secondary | ICD-10-CM

## 2021-01-23 DIAGNOSIS — M25662 Stiffness of left knee, not elsewhere classified: Secondary | ICD-10-CM

## 2021-01-23 NOTE — Therapy (Addendum)
[name] [address] [place], [place], [address] Phone: [phone]   Fax:  [phone]  Physical Therapy Treatment  Patient Details  Name: Curtis Singh MRN: [id number] Date of Birth: 1955/12/16 Referring Provider (PT): Dr. Roland Rack   Encounter Date: 01/23/2021   PT End of Session - 01/23/21 1023     Visit Number 12    Number of Visits 17    Date for PT Re-Evaluation 02/11/21    Authorization - Visit Number 2    Authorization - Number of Visits 10    PT Start Time 9983    PT Stop Time 1115    PT Time Calculation (min) 60 min    Activity Tolerance Patient limited by pain;Patient tolerated treatment well    Behavior During Therapy [facility] for tasks assessed/performed             Past Medical History:  Diagnosis Date   Aortic atherosclerosis ([place])    Coronary artery disease 12/2019   a.) LHC 12/16/2019: EF 55-65%, LVEDP norm. 99% p-mRCA, 20% mRCA. --> PCI placing a 3.0 x 22 mm Resolute Onyx DES to pRCA.   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    HLD (hyperlipidemia)    Hypertension    Long term current use of antithrombotics/antiplatelets    a.) DAPT (ASA + ticagrelor)   Osteoarthritis    Seasonal allergies    T2DM (type 2 diabetes mellitus) ([place])     Past Surgical History:  Procedure Laterality Date   ARTHOSCOPIC ROTAOR CUFF REPAIR Left 02/25/2018   Procedure: LEFT ARTHROSCOPIC ROTATOR CUFF REPAIR; SUBACROMIAL DECOMPRESSION;  Surgeon: Tania Ade, MD;  [facility]: [facility];  Service: Orthopedics;  Laterality: Left;   CATARACT EXTRACTION W/PHACO Right 09/03/2016   Procedure: CATARACT EXTRACTION PHACO AND INTRAOCULAR LENS PLACEMENT ([place]) RIGHT DIABETIC;  Surgeon: Leandrew Koyanagi, MD;  Location: [place];  Service: Ophthalmology;  Laterality: Right;  DIABETIC   COLONOSCOPY WITH PROPOFOL N/A 08/29/2019   Procedure: COLONOSCOPY  WITH PROPOFOL;  Surgeon: Lin Landsman, MD;  Location: [facility] ENDOSCOPY;  Service: Gastroenterology;  Laterality: N/A;   CORONARY STENT INTERVENTION N/A 12/16/2019   Procedure: CORONARY STENT INTERVENTION (3.0 x 22 mm Resolute Onyx DES to pRCA);  Surgeon: Wellington Hampshire, MD;  Location: [place] [facility];  Service: Cardiovascular;  Laterality: N/A;   LEFT HEART CATH AND CORONARY ANGIOGRAPHY N/A 12/16/2019   Procedure: LEFT HEART CATH AND CORONARY ANGIOGRAPHY;  Surgeon: Wellington Hampshire, MD;  Location: [place] [facility];  Service: Cardiovascular;  Laterality: N/A;   ROTATOR CUFF REPAIR Right 01/07/2007   TOTAL KNEE ARTHROPLASTY Left 12/13/2020   Procedure: TOTAL KNEE ARTHROPLASTY;  Surgeon: Corky Mull, MD;  Location: [facility];  Service: Orthopedics;  Laterality: Left;   VASECTOMY N/A     There were no vitals filed for this visit.   Subjective Assessment - 01/23/21 1020     Subjective Pt. reports 2/10 L knee pain this morning.  Pt. entered PT with use of SPC.  Pt. states swelling has gone down since last session and is having less pain. Pt. is still having trouble sleeping through the night but states it is not due to any knee pain.    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 Pt. 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  Pt. known well to skilled PT  services.  Pt. has good caregiver assist for daily household tasks/ driving.    Limitations Standing;Walking;House hold activities    Patient Stated Goals Increase L knee AROM/ strength to improve pain-free mobility and return to work.    Currently in Pain? Yes    Pain Score 2     Pain Location Knee    Pain Orientation Left              L knee AROM after tx. (-8 to 92 deg.)   Therex.:    Nustep L4, seat 10-7 for 12 min. B UE/LE (warm-up/ non-billed).  Discussed pain/ sleeping.     Walking in //-bars:  forward/backward (visual and verbal cueing for L knee extension)/ lateral direction  with R UE assistance.  Mirror feedback/ posture correction).   Walking marches in //-bars 4x  Sit to stand squats at table with varying height 2 x 10.  Pt. Cued to keep chest up to increase the need for knee flexion.    Gastroc stretch at stairs 3x 20 secs each leg   Reciprocal gait stepping up and down stairs    Supine quad set 2x10   TG knee flexion (static holds)- 10x but very pain limited.    Seated contract relax 2x5 knee flexion.     Manual tx.:   Supine L knee AAROM flexion 5x each.  Pain limited at <90 deg.  Supine Superior patellar mobs. During active knee extension 2x5.   Ice to L knee at home       PT Long Term Goals - 01/16/21 1122       PT LONG TERM GOAL #1   Title Pt. will increase FOTO to 45 to improve pain-free moblity with daily tasks.    Baseline FOTO: initial: 12.  1/11: 56    Time 8    Period Weeks    Status Achieved    Target Date 01/16/21      PT LONG TERM GOAL #2   Title Pt. will increase L knee AROM to 0->115 deg. to improve walking/ stairs/ return to work.    Baseline R knee AROM: -1 to 128 deg. and strength grossly 5/5 MMT. Pt. pain limited with L knee A/PROM: extensino -12/-10 deg., flexion 64/72 deg. (guarded/ pain limted).  1/11: supine L knee (-11 to 86 deg.),  seated L knee flexion 88 deg.    Time 8    Period Weeks    Status Not Met    Target Date 02/11/21      PT LONG TERM GOAL #3   Title Pt. will stand from chair with proper technique/ no UE assist to improve pain-free mobility.    Baseline 9/10 L knee pain with sit to stands    Time 8    Period Weeks    Status Partially Met    Target Date 02/11/21      PT LONG TERM GOAL #4   Title Pt. will ambulate community distances with normalized gait pattern/ no assistive device to improve return to work.    Baseline Pt. ambulates with limited L LE wt. bearing and heavy UE assist on RW. Limited R LE step length during L LE weight bearing due to pain.    Time 8    Period Weeks    Status  Partially Met    Target Date 02/11/21      PT LONG TERM GOAL #5   Title Pt. will ascend/ descend stairs with recip. pattern and no handrail to improve  functional mobility.    Baseline Heavy UE assist with recip. gait pattern    Time 8    Period Weeks    Status Partially Met    Target Date 02/11/21                   Plan - 01/23/21 1116     Clinical Impression Statement Pt. states he has increase pain with L knee AROM. Pt. is motivated to push through exercises but is very limited by pain/ muscle guarding. Pt. is able to reach 90 degrees of L knee flexion but has an increase in pain afterwards. Pt. responds well to contract/relax stretching as it increases his overall ROM.  PT will continue to benefit from skilled PT services to increase L knee ROM/ strengthening to normalize gait pattern without use of assistive device.    Stability/Clinical Decision Making Evolving/Moderate complexity    Clinical Decision Making Moderate    Rehab Potential Good    PT Frequency 2x / week    PT Duration 8 weeks    PT Treatment/Interventions ADLs/Self Care Home Management;Cryotherapy;Electrical Stimulation;Functional mobility training;Therapeutic activities;Therapeutic exercise;Patient/family education;Neuromuscular re-education;Manual techniques;Scar mobilization;Passive range of motion;Stair training;Gait training;DME Instruction;Balance training    PT Next Visit Plan Progress L knee ROM.  Discuss MD f/u.    PT Home Exercise Plan Access Code: [email or web address]    Consulted and Agree with Plan of Care Patient             Patient will benefit from skilled therapeutic intervention in order to improve the following deficits and impairments:  Decreased mobility, Decreased strength, Impaired flexibility, Hypomobility, Pain, Abnormal gait, Decreased balance, Difficulty walking, Decreased range of motion, Decreased scar mobility, Decreased activity tolerance  Visit Diagnosis: S/P total knee replacement,  left  Joint stiffness of knee, left  Acute pain of left knee     Problem List Patient Active Problem List   Diagnosis Date Noted   Complex tear of medial meniscus of left knee as current injury 08/22/2020   Primary osteoarthritis of left knee 06/06/2020   Left knee pain 03/29/2020   Primary insomnia 03/29/2020   Encounter for medication titration 02/09/2020   Unstable angina ([facility])    Chest discomfort 12/12/2019   History of colonic polyps    Annual physical exam 07/01/2019   ED (erectile dysfunction) of organic origin 02/21/2015   Diabetes mellitus type 2 with complications, uncontrolled 10/18/2014   Hyperlipemia 10/18/2014   Pura Spice, PT, DPT # [place], SPT 01/23/2021, 12:37 PM  [facility] [address] [place], [place], [address] Phone: [phone]   Fax:  [phone]  Name: Curtis Singh MRN: [id number] Date of Birth: February 22, 1955

## 2021-01-28 ENCOUNTER — Other Ambulatory Visit: Payer: Self-pay

## 2021-01-28 ENCOUNTER — Encounter: Payer: Self-pay | Admitting: Physical Therapy

## 2021-01-28 ENCOUNTER — Ambulatory Visit: Payer: No Typology Code available for payment source | Admitting: Physical Therapy

## 2021-01-28 DIAGNOSIS — Z96652 Presence of left artificial knee joint: Secondary | ICD-10-CM | POA: Diagnosis not present

## 2021-01-28 DIAGNOSIS — M25562 Pain in left knee: Secondary | ICD-10-CM

## 2021-01-28 DIAGNOSIS — R269 Unspecified abnormalities of gait and mobility: Secondary | ICD-10-CM

## 2021-01-28 LAB — HM DIABETES EYE EXAM

## 2021-01-28 NOTE — Therapy (Addendum)
[facility] [address] [place], [place], [address] Phone: [phone]   Fax:  [phone]  Physical Therapy Treatment  Patient Details  Name: Curtis Singh MRN: [id number] Date of Birth: January 08, 1955 Referring Provider (PT): Dr. Roland Rack   Encounter Date: 01/28/2021   PT End of Session - 01/28/21 1115     Visit Number 13    Number of Visits 17    Date for PT Re-Evaluation 02/11/21    Authorization - Visit Number 3    Authorization - Number of Visits 10    PT Start Time 3545    PT Stop Time 1115    PT Time Calculation (min) 60 min    Activity Tolerance Patient limited by pain;Patient tolerated treatment well    Behavior During Therapy [facility] for tasks assessed/performed             Past Medical History:  Diagnosis Date   Aortic atherosclerosis ([place])    Coronary artery disease 12/2019   a.) LHC 12/16/2019: EF 55-65%, LVEDP norm. 99% p-mRCA, 20% mRCA. --> PCI placing a 3.0 x 22 mm Resolute Onyx DES to pRCA.   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    HLD (hyperlipidemia)    Hypertension    Long term current use of antithrombotics/antiplatelets    a.) DAPT (ASA + ticagrelor)   Osteoarthritis    Seasonal allergies    T2DM (type 2 diabetes mellitus) ([place])     Past Surgical History:  Procedure Laterality Date   ARTHOSCOPIC ROTAOR CUFF REPAIR Left 02/25/2018   Procedure: LEFT ARTHROSCOPIC ROTATOR CUFF REPAIR; SUBACROMIAL DECOMPRESSION;  Surgeon: Tania Ade, MD;  [facility];  Service: Orthopedics;  Laterality: Left;   CATARACT EXTRACTION W/PHACO Right 09/03/2016   Procedure: CATARACT EXTRACTION PHACO AND INTRAOCULAR LENS PLACEMENT ([place]) RIGHT DIABETIC;  Surgeon: Leandrew Koyanagi, MD;  Location: [place];  Service: Ophthalmology;  Laterality: Right;  DIABETIC   COLONOSCOPY WITH PROPOFOL N/A 08/29/2019   Procedure: COLONOSCOPY  WITH PROPOFOL;  Surgeon: Lin Landsman, MD;  Location: [facility] ENDOSCOPY;  Service: Gastroenterology;  Laterality: N/A;   CORONARY STENT INTERVENTION N/A 12/16/2019   Procedure: CORONARY STENT INTERVENTION (3.0 x 22 mm Resolute Onyx DES to pRCA);  Surgeon: Wellington Hampshire, MD;  Location: [place] [facility];  Service: Cardiovascular;  Laterality: N/A;   LEFT HEART CATH AND CORONARY ANGIOGRAPHY N/A 12/16/2019   Procedure: LEFT HEART CATH AND CORONARY ANGIOGRAPHY;  Surgeon: Wellington Hampshire, MD;  Location: [place] [facility];  Service: Cardiovascular;  Laterality: N/A;   ROTATOR CUFF REPAIR Right 01/07/2007   TOTAL KNEE ARTHROPLASTY Left 12/13/2020   Procedure: TOTAL KNEE ARTHROPLASTY;  Surgeon: Corky Mull, MD;  Location: [facility];  Service: Orthopedics;  Laterality: Left;   VASECTOMY N/A     There were no vitals filed for this visit.   Subjective Assessment - 01/28/21 1230     Subjective Pt. reports no pain when entering PT.  Pt. entered PT with use of SPC and antalgic gait pattern.  Pt. is motivated to work on ROM post MD visit and stresses the importance of pushing to new ranges.    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  Pt. known well to skilled PT services.  Pt. has good caregiver assist for daily household tasks/  driving.    Limitations Standing;Walking;House hold activities    Patient Stated Goals Increase L knee AROM/ strength to improve pain-free mobility and return to work.    Currently in Pain? No/denies                 Therex.:    Nustep L4, seat 10-7 for 12 min. B UE/LE (warm-up/ non-billed).  Discussed MD appointment.   Supine quad set 2x10   TG knee flexion (static holds)- 10x but very pain limited.    Seated contract relax 2x10 knee flexion. Pt. Noted he felt an increase in ROM but very pain limited.  Prone hang with heat applied to distal hamstrings. 3x30sec      Manual tx.:   Supine L knee AA/PROM flexion 10x each.  Pain limited at <90 deg.   Supine Superior patellar mobs. During active knee extension 2x8.   Ice to L knee for 10 minutes at end of session       PT Short Term Goals - 01/16/21 1248       PT SHORT TERM GOAL #1   Baseline --               PT Long Term Goals - 01/16/21 1122       PT LONG TERM GOAL #1   Title Pt. will increase FOTO to 45 to improve pain-free moblity with daily tasks.    Baseline FOTO: initial: 12.  1/11: 56    Time 8    Period Weeks    Status Achieved    Target Date 01/16/21      PT LONG TERM GOAL #2   Title Pt. will increase L knee AROM to 0->115 deg. to improve walking/ stairs/ return to work.    Baseline R knee AROM: -1 to 128 deg. and strength grossly 5/5 MMT. Pt. pain limited with L knee A/PROM: extensino -12/-10 deg., flexion 64/72 deg. (guarded/ pain limted).  1/11: supine L knee (-11 to 86 deg.),  seated L knee flexion 88 deg.    Time 8    Period Weeks    Status Not Met    Target Date 02/11/21      PT LONG TERM GOAL #3   Title Pt. will stand from chair with proper technique/ no UE assist to improve pain-free mobility.    Baseline 9/10 L knee pain with sit to stands    Time 8    Period Weeks    Status Partially Met    Target Date 02/11/21      PT LONG TERM GOAL #4   Title Pt. will ambulate community distances with normalized gait pattern/ no assistive device to improve return to work.    Baseline Pt. ambulates with limited L LE wt. bearing and heavy UE assist on RW. Limited R LE step length during L LE weight bearing due to pain.    Time 8    Period Weeks    Status Partially Met    Target Date 02/11/21      PT LONG TERM GOAL #5   Title Pt. will ascend/ descend stairs with recip. pattern and no handrail to improve functional mobility.    Baseline Heavy UE assist with recip. gait pattern    Time 8    Period Weeks    Status Partially Met    Target Date 02/11/21                    Plan - 01/28/21 1232  Clinical Impression Statement Pt. has a marked increase in L knee pain with more aggressive stretching/ PROM (flexion/ extension). Pt. is highly motivated to push through exercises to improve ROM and avoid the possibility of a knee manipulation.  PT reviewed all HEP and the importance of consistent knee stretches/ prone hang to improve knee extension.  Pt. will continue to benefit from hands on therapy to increase his L knee ROM and decrease pain as much as possible to promote return to work.    Stability/Clinical Decision Making Evolving/Moderate complexity    Clinical Decision Making Moderate    Rehab Potential Good    PT Frequency 2x / week    PT Duration 8 weeks    PT Treatment/Interventions ADLs/Self Care Home Management;Cryotherapy;Electrical Stimulation;Functional mobility training;Therapeutic activities;Therapeutic exercise;Patient/family education;Neuromuscular re-education;Manual techniques;Scar mobilization;Passive range of motion;Stair training;Gait training;DME Instruction;Balance training    PT Next Visit Plan Progress L knee ROM.    PT Home Exercise Plan Access Code: [email or web address]    Consulted and Agree with Plan of Care Patient             Patient will benefit from skilled therapeutic intervention in order to improve the following deficits and impairments:  Decreased mobility, Decreased strength, Impaired flexibility, Hypomobility, Pain, Abnormal gait, Decreased balance, Difficulty walking, Decreased range of motion, Decreased scar mobility, Decreased activity tolerance  Visit Diagnosis: S/P total knee replacement, left  Gait difficulty  Acute pain of left knee     Problem List Patient Active Problem List   Diagnosis Date Noted   Complex tear of medial meniscus of left knee as current injury 08/22/2020   Primary osteoarthritis of left knee 06/06/2020   Left knee pain 03/29/2020   Primary insomnia 03/29/2020   Encounter for  medication titration 02/09/2020   Unstable angina ([facility])    Chest discomfort 12/12/2019   History of colonic polyps    Annual physical exam 07/01/2019   ED (erectile dysfunction) of organic origin 02/21/2015   Diabetes mellitus type 2 with complications, uncontrolled 10/18/2014   Hyperlipemia 10/18/2014   Curtis Singh, PT, DPT # [place], SPT 01/28/2021, 12:51 PM  [facility] [address] [place], [place], [address] Phone: [phone]   Fax:  [phone]  Name: Curtis Singh MRN: [id number] Date of Birth: 1955/12/20

## 2021-01-30 ENCOUNTER — Ambulatory Visit: Payer: No Typology Code available for payment source | Admitting: Physical Therapy

## 2021-01-30 ENCOUNTER — Other Ambulatory Visit: Payer: Self-pay

## 2021-01-30 ENCOUNTER — Encounter: Payer: Self-pay | Admitting: Physical Therapy

## 2021-01-30 DIAGNOSIS — Z96652 Presence of left artificial knee joint: Secondary | ICD-10-CM

## 2021-01-30 DIAGNOSIS — M25662 Stiffness of left knee, not elsewhere classified: Secondary | ICD-10-CM

## 2021-01-30 DIAGNOSIS — M6281 Muscle weakness (generalized): Secondary | ICD-10-CM

## 2021-01-30 MED ORDER — AMOXICILLIN 500 MG PO CAPS
ORAL_CAPSULE | ORAL | 5 refills | Status: DC
  Filled 2021-01-30: qty 4, 1d supply, fill #0

## 2021-01-30 NOTE — Therapy (Addendum)
[facility] [address] [place], [place], [address] Phone: [phone]   Fax:  [phone]  Physical Therapy Treatment  Patient Details  Name: Curtis Singh MRN: [id number] Date of Birth: 11-Mar-1955 Referring Provider (PT): Dr. Roland Rack   Encounter Date: 01/30/2021   PT End of Session - 01/30/21 1245     Visit Number 14    Number of Visits 17    Date for PT Re-Evaluation 02/11/21    Authorization - Visit Number 4    Authorization - Number of Visits 10    PT Start Time 9030    PT Stop Time 1120    PT Time Calculation (min) 52 min    Activity Tolerance Patient limited by pain;Patient tolerated treatment well    Behavior During Therapy [facility] for tasks assessed/performed             Past Medical History:  Diagnosis Date   Aortic atherosclerosis ([place])    Coronary artery disease 12/2019   a.) LHC 12/16/2019: EF 55-65%, LVEDP norm. 99% p-mRCA, 20% mRCA. --> PCI placing a 3.0 x 22 mm Resolute Onyx DES to pRCA.   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    HLD (hyperlipidemia)    Hypertension    Long term current use of antithrombotics/antiplatelets    a.) DAPT (ASA + ticagrelor)   Osteoarthritis    Seasonal allergies    T2DM (type 2 diabetes mellitus) ([place])     Past Surgical History:  Procedure Laterality Date   ARTHOSCOPIC ROTAOR CUFF REPAIR Left 02/25/2018   Procedure: LEFT ARTHROSCOPIC ROTATOR CUFF REPAIR; SUBACROMIAL DECOMPRESSION;  Surgeon: Tania Ade, MD;  [facility]: [facility];  Service: Orthopedics;  Laterality: Left;   CATARACT EXTRACTION W/PHACO Right 09/03/2016   Procedure: CATARACT EXTRACTION PHACO AND INTRAOCULAR LENS PLACEMENT ([place]) RIGHT DIABETIC;  Surgeon: Leandrew Koyanagi, MD;  Location: [place];  Service: Ophthalmology;  Laterality: Right;  DIABETIC   COLONOSCOPY WITH PROPOFOL N/A 08/29/2019   Procedure: COLONOSCOPY  WITH PROPOFOL;  Surgeon: Lin Landsman, MD;  Location: [facility];  Service: Gastroenterology;  Laterality: N/A;   CORONARY STENT INTERVENTION N/A 12/16/2019   Procedure: CORONARY STENT INTERVENTION (3.0 x 22 mm Resolute Onyx DES to pRCA);  Surgeon: Wellington Hampshire, MD;  Location: [place] [facility];  Service: Cardiovascular;  Laterality: N/A;   LEFT HEART CATH AND CORONARY ANGIOGRAPHY N/A 12/16/2019   Procedure: LEFT HEART CATH AND CORONARY ANGIOGRAPHY;  Surgeon: Wellington Hampshire, MD;  Location: [place] [facility];  Service: Cardiovascular;  Laterality: N/A;   ROTATOR CUFF REPAIR Right 01/07/2007   TOTAL KNEE ARTHROPLASTY Left 12/13/2020   Procedure: TOTAL KNEE ARTHROPLASTY;  Surgeon: Corky Mull, MD;  Location: [facility];  Service: Orthopedics;  Laterality: Left;   VASECTOMY N/A     There were no vitals filed for this visit.   Subjective Assessment - 01/30/21 1240     Subjective Pt. reports no pain when entering the clinic. Pt. states he is starting to ambulate in his house with no AD and is feeling good. Pt. is highly motivated to increase L knee ROM.    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 Pt. 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  Pt. known well to skilled PT services.  Pt. has good caregiver assist for daily household tasks/ driving.    Limitations  Standing;Walking;House hold activities    Patient Stated Goals Increase L knee AROM/ strength to improve pain-free mobility and return to work.    Currently in Pain? No/denies    Pain Score 0-No pain    Pain Location Knee    Pain Orientation Left              Therex.:    Nustep L4, seat 10-7 for 10 min. B UE/LE (warm-up/ non-billed).     Gastroc stretch on stairs 3x20sec. 2nd step L knee flexion/lunges and gastroc. Stretches 3x20 sec. Each.     Hamstring stretch on stairs 3x20secs. Pt. Working on front leg knee ext. During stretch.   Ascend/descend stairs with recip.  Patterns with UE assist on handrails.      TG knee flexion (static holds)- 10x but very pain limited. Quad STM during static hold.    Seated contract relax 2x10 knee flexion. Pt. Noted he felt an increase in ROM but very pain limited.   Prone hang with heat applied to distal hamstrings with 2# ankle wt. Pt. Is limited by pain but shows improvement to relax into the stretch.      Manual tx.:   Supine L knee AA/PROM flexion 10x each.  Pain limited at <90 deg.   Supine Superior patellar mobs. During active knee extension 2x5   Ice to L knee for 10 minutes at end of session     PT Short Term Goals - 01/16/21 1248       PT SHORT TERM GOAL #1   Baseline --               PT Long Term Goals - 01/16/21 1122       PT LONG TERM GOAL #1   Title Pt. will increase FOTO to 45 to improve pain-free moblity with daily tasks.    Baseline FOTO: initial: 12.  1/11: 56    Time 8    Period Weeks    Status Achieved    Target Date 01/16/21      PT LONG TERM GOAL #2   Title Pt. will increase L knee AROM to 0->115 deg. to improve walking/ stairs/ return to work.    Baseline R knee AROM: -1 to 128 deg. and strength grossly 5/5 MMT. Pt. pain limited with L knee A/PROM: extensino -12/-10 deg., flexion 64/72 deg. (guarded/ pain limted).  1/11: supine L knee (-11 to 86 deg.),  seated L knee flexion 88 deg.    Time 8    Period Weeks    Status Not Met    Target Date 02/11/21      PT LONG TERM GOAL #3   Title Pt. will stand from chair with proper technique/ no UE assist to improve pain-free mobility.    Baseline 9/10 L knee pain with sit to stands    Time 8    Period Weeks    Status Partially Met    Target Date 02/11/21      PT LONG TERM GOAL #4   Title Pt. will ambulate community distances with normalized gait pattern/ no assistive device to improve return to work.    Baseline Pt. ambulates with limited L LE wt. bearing and heavy UE assist on RW. Limited R LE step length during L LE weight  bearing due to pain.    Time 8    Period Weeks    Status Partially Met    Target Date 02/11/21      PT LONG TERM GOAL #  5   Title Pt. will ascend/ descend stairs with recip. pattern and no handrail to improve functional mobility.    Baseline Heavy UE assist with recip. gait pattern    Time 8    Period Weeks    Status Partially Met    Target Date 02/11/21                   Plan - 01/30/21 1247     Clinical Impression Statement Pt. is continuing to make more noticable improvement in L knee AA/PROM with more aggressive stretching.  Pt. continues to work hard and is very motivated to improve ROM these next 2-3 weeks. Pt. continues to be significantly limited by pain and is able to handle pushing into further knee flexion after performing L quad muscles to fatigue.  Pt. will continue to benefit from manual therapy to allow him to push L knee into new ROM.  PT educated pt. on the importance of increasing knee extension to 0 deg. to improve gait/ standing tolerance.    Stability/Clinical Decision Making Evolving/Moderate complexity    Clinical Decision Making Moderate    Rehab Potential Good    PT Frequency 2x / week    PT Duration 8 weeks    PT Treatment/Interventions ADLs/Self Care Home Management;Cryotherapy;Electrical Stimulation;Functional mobility training;Therapeutic activities;Therapeutic exercise;Patient/family education;Neuromuscular re-education;Manual techniques;Scar mobilization;Passive range of motion;Stair training;Gait training;DME Instruction;Balance training    PT Next Visit Plan Progress L knee ROM.  Recheck L knee measurments.    PT Home Exercise Plan Access Code: [email or web address]    [email or web address] and Agree with Plan of Care Patient             Patient will benefit from skilled therapeutic intervention in order to improve the following deficits and impairments:  Decreased mobility, Decreased strength, Impaired flexibility, Hypomobility, Pain, Abnormal gait, Decreased  balance, Difficulty walking, Decreased range of motion, Decreased scar mobility, Decreased activity tolerance  Visit Diagnosis: S/P total knee replacement, left  Joint stiffness of knee, left  Muscle weakness (generalized)     Problem List Patient Active Problem List   Diagnosis Date Noted   Complex tear of medial meniscus of left knee as current injury 08/22/2020   Primary osteoarthritis of left knee 06/06/2020   Left knee pain 03/29/2020   Primary insomnia 03/29/2020   Encounter for medication titration 02/09/2020   Unstable angina (HCC)    Chest discomfort 12/12/2019   History of colonic polyps    Annual physical exam 07/01/2019   ED (erectile dysfunction) of organic origin 02/21/2015   Diabetes mellitus type 2 with complications, uncontrolled 10/18/2014   Hyperlipemia 10/18/2014   Pura Spice, PT, DPT # [place], SPT 01/30/2021, 1:36 PM  [facility] [address] [place], [place], [address] Phone: [phone]   Fax:  [phone]  Name: Curtis Singh MRN: [id number] Date of Birth: Jan 18, 1955

## 2021-01-31 ENCOUNTER — Other Ambulatory Visit: Payer: Self-pay

## 2021-01-31 MED ORDER — AMOXICILLIN 500 MG PO CAPS: 500.0000 mg | ORAL_CAPSULE | ORAL | 1 refills | Status: DC

## 2021-02-04 ENCOUNTER — Ambulatory Visit: Payer: No Typology Code available for payment source | Admitting: Physical Therapy

## 2021-02-04 ENCOUNTER — Encounter: Payer: Self-pay | Admitting: Physical Therapy

## 2021-02-04 ENCOUNTER — Other Ambulatory Visit: Payer: Self-pay

## 2021-02-04 DIAGNOSIS — M25662 Stiffness of left knee, not elsewhere classified: Secondary | ICD-10-CM

## 2021-02-04 DIAGNOSIS — Z96652 Presence of left artificial knee joint: Secondary | ICD-10-CM

## 2021-02-04 DIAGNOSIS — R269 Unspecified abnormalities of gait and mobility: Secondary | ICD-10-CM

## 2021-02-04 NOTE — Therapy (Addendum)
[facility] [address] [place], [place], [address] Phone: [phone]   Fax:  [phone]  Physical Therapy Treatment  Patient Details  Name: Curtis Singh MRN: [id number] Date of Birth: [birth year] Referring Provider (PT): Dr. Roland Rack   Encounter Date: 02/04/2021   PT End of Session - 02/04/21 1033     Visit Number 15    Number of Visits 17    Date for PT Re-Evaluation 02/11/21    Authorization - Visit Number 5    Authorization - Number of Visits 10    PT Start Time 3244    PT Stop Time 1130    PT Time Calculation (min) 61 min    Activity Tolerance Patient limited by pain;Patient tolerated treatment well    Behavior During Therapy [facility] for tasks assessed/performed             Past Medical History:  Diagnosis Date   Aortic atherosclerosis ([place])    Coronary artery disease 12/2019   a.) LHC 12/16/2019: EF 55-65%, LVEDP norm. 99% p-mRCA, 20% mRCA. --> PCI placing a 3.0 x 22 mm Resolute Onyx DES to pRCA.   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    HLD (hyperlipidemia)    Hypertension    Long term current use of antithrombotics/antiplatelets    a.) DAPT (ASA + ticagrelor)   Osteoarthritis    Seasonal allergies    T2DM (type 2 diabetes mellitus) ([place])     Past Surgical History:  Procedure Laterality Date   ARTHOSCOPIC ROTAOR CUFF REPAIR Left 02/25/2018   Procedure: LEFT ARTHROSCOPIC ROTATOR CUFF REPAIR; SUBACROMIAL DECOMPRESSION;  Surgeon: Tania Ade, MD;  [facility];  Service: Orthopedics;  Laterality: Left;   CATARACT EXTRACTION W/PHACO Right 09/03/2016   Procedure: CATARACT EXTRACTION PHACO AND INTRAOCULAR LENS PLACEMENT ([place]) RIGHT DIABETIC;  Surgeon: Leandrew Koyanagi, MD;  Location: [place];  Service: Ophthalmology;  Laterality: Right;  DIABETIC   COLONOSCOPY WITH PROPOFOL N/A 08/29/2019   Procedure: COLONOSCOPY  WITH PROPOFOL;  Surgeon: Lin Landsman, MD;  Location: [facility];  Service: Gastroenterology;  Laterality: N/A;   CORONARY STENT INTERVENTION N/A 12/16/2019   Procedure: CORONARY STENT INTERVENTION (3.0 x 22 mm Resolute Onyx DES to pRCA);  Surgeon: Wellington Hampshire, MD;  Location: [place] CV LAB;  Service: Cardiovascular;  Laterality: N/A;   LEFT HEART CATH AND CORONARY ANGIOGRAPHY N/A 12/16/2019   Procedure: LEFT HEART CATH AND CORONARY ANGIOGRAPHY;  Surgeon: Wellington Hampshire, MD;  Location: [place] CV [facility];  Service: Cardiovascular;  Laterality: N/A;   ROTATOR CUFF REPAIR Right 01/07/2007   TOTAL KNEE ARTHROPLASTY Left 12/13/2020   Procedure: TOTAL KNEE ARTHROPLASTY;  Surgeon: Corky Mull, MD;  Location: [facility];  Service: Orthopedics;  Laterality: Left;   VASECTOMY N/A     There were no vitals filed for this visit.   Subjective Assessment - 02/04/21 1219     Subjective Pt. reports no pain when entering the clinic. Pt. still is limited in L knee extension and it is affecting his stance phase during gait.    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 Pt. 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  Pt. known well to skilled PT services.  Pt. has good caregiver assist for daily household tasks/ driving.    Limitations Standing;Walking;House hold activities    Patient Stated Goals  Increase L knee AROM/ strength to improve pain-free mobility and return to work.    Currently in Pain? No/denies              Therex.:    Nustep L4, seat 9-7 for 10 min. B UE/LE (warm-up/ non-billed).     Gastroc stretch on stairs 3x20sec. 2nd step L knee flexion/lunges and gastroc. Stretches 3x20 sec. Each.  PT.  Manually Applied pressure to extend back knee     TG knee flexion (static holds)- 10x but very pain limited. Quad STM during static hold.    Seated contract relax 2x10 knee flexion.    Prone hang with heat applied to distal hamstrings  with 3# ankle wt. Pt. Is limited by pain but shows improvement to relax into the stretch.      Manual tx.:   Supine L knee AA/PROM flexion 10x each.  Pain limited at <90 deg.   Supine Superior patellar mobs. During active knee extension 2x5   Ice to L knee for 10 minutes at end of session    PT Long Term Goals - 01/16/21 1122       PT LONG TERM GOAL #1   Title Pt. will increase FOTO to 45 to improve pain-free moblity with daily tasks.    Baseline FOTO: initial: 12.  1/11: 56    Time 8    Period Weeks    Status Achieved    Target Date 01/16/21      PT LONG TERM GOAL #2   Title Pt. will increase L knee AROM to 0->115 deg. to improve walking/ stairs/ return to work.    Baseline R knee AROM: -1 to 128 deg. and strength grossly 5/5 MMT. Pt. pain limited with L knee A/PROM: extensino -12/-10 deg., flexion 64/72 deg. (guarded/ pain limted).  1/11: supine L knee (-11 to 86 deg.),  seated L knee flexion 88 deg.    Time 8    Period Weeks    Status Not Met    Target Date 02/11/21      PT LONG TERM GOAL #3   Title Pt. will stand from chair with proper technique/ no UE assist to improve pain-free mobility.    Baseline 9/10 L knee pain with sit to stands    Time 8    Period Weeks    Status Partially Met    Target Date 02/11/21      PT LONG TERM GOAL #4   Title Pt. will ambulate community distances with normalized gait pattern/ no assistive device to improve return to work.    Baseline Pt. ambulates with limited L LE wt. bearing and heavy UE assist on RW. Limited R LE step length during L LE weight bearing due to pain.    Time 8    Period Weeks    Status Partially Met    Target Date 02/11/21      PT LONG TERM GOAL #5   Title Pt. will ascend/ descend stairs with recip. pattern and no handrail to improve functional mobility.    Baseline Heavy UE assist with recip. gait pattern    Time 8    Period Weeks    Status Partially Met    Target Date 02/11/21                    Plan - 02/04/21 1222     Clinical Impression Statement Pt. is continuing to push his PROM to try and increase ROM before next MD visit (  02/27/21). Pt. has a major increase in pain when pushed passively into new ranges but is motivated to keep going. Pt. was educated on the importance of increasing knee extension during gait and while in his standing posture. Pt. has a tendency to relax his L LE in a slightly bent position which is leading to limitations of his functional ability to extend his knee. Pt. will continue to benefit from hands on therapy to increase ROM and decrease pain. PT sessions consist of strengthening exercises to fatigue quad and hamstring muscles prior to passively pushing his knee into new ranges.  Pt. has 96 deg L knee flexion and -9 deg of extension at end of tx. session.    Stability/Clinical Decision Making Evolving/Moderate complexity    Clinical Decision Making Moderate    Rehab Potential Good    PT Frequency 2x / week    PT Duration 8 weeks    PT Treatment/Interventions ADLs/Self Care Home Management;Cryotherapy;Electrical Stimulation;Functional mobility training;Therapeutic activities;Therapeutic exercise;Patient/family education;Neuromuscular re-education;Manual techniques;Scar mobilization;Passive range of motion;Stair training;Gait training;DME Instruction;Balance training    PT Next Visit Plan Progress L knee ROM.  Recheck L knee measurments.    PT Home Exercise Plan Access Code: [email or web address]    Consulted and Agree with Plan of Care Patient             Patient will benefit from skilled therapeutic intervention in order to improve the following deficits and impairments:  Decreased mobility, Decreased strength, Impaired flexibility, Hypomobility, Pain, Abnormal gait, Decreased balance, Difficulty walking, Decreased range of motion, Decreased scar mobility, Decreased activity tolerance  Visit Diagnosis: S/P total knee replacement, left  Joint stiffness of knee,  left  Gait difficulty     Problem List Patient Active Problem List   Diagnosis Date Noted   Complex tear of medial meniscus of left knee as current injury 08/22/2020   Primary osteoarthritis of left knee 06/06/2020   Left knee pain 03/29/2020   Primary insomnia 03/29/2020   Encounter for medication titration 02/09/2020   Unstable angina (H[facility])    Chest discomfort 12/12/2019   History of colonic polyps    Annual physical exam 07/01/2019   ED (erectile dysfunction) of organic origin 02/21/2015   Diabetes mellitus type 2 with complications, uncontrolled 10/18/2014   Hyperlipemia 10/18/2014   Pura Spice, PT, DPT # [place], SPT 02/04/2021, 12:47 PM  [place] [address] [place], [place], [address] Phone: [phone]   Fax:  [phone]  Name: Curtis Singh MRN: [id number] Date of Birth: 1955-01-24

## 2021-02-06 ENCOUNTER — Other Ambulatory Visit: Payer: Self-pay

## 2021-02-06 ENCOUNTER — Encounter: Payer: Self-pay | Admitting: Physical Therapy

## 2021-02-06 ENCOUNTER — Ambulatory Visit: Payer: No Typology Code available for payment source | Attending: Surgery | Admitting: Physical Therapy

## 2021-02-06 DIAGNOSIS — M25562 Pain in left knee: Secondary | ICD-10-CM | POA: Diagnosis present

## 2021-02-06 DIAGNOSIS — Z96652 Presence of left artificial knee joint: Secondary | ICD-10-CM | POA: Insufficient documentation

## 2021-02-06 DIAGNOSIS — M25662 Stiffness of left knee, not elsewhere classified: Secondary | ICD-10-CM | POA: Insufficient documentation

## 2021-02-06 DIAGNOSIS — R269 Unspecified abnormalities of gait and mobility: Secondary | ICD-10-CM | POA: Diagnosis present

## 2021-02-06 DIAGNOSIS — M6281 Muscle weakness (generalized): Secondary | ICD-10-CM | POA: Diagnosis present

## 2021-02-06 NOTE — Therapy (Addendum)
[facility] [address] [place], [place], [address] Phone: [phone]   Fax:  [phone]  Physical Therapy Treatment  Patient Details  Name: Curtis Singh MRN: [id number] Date of Birth: [birth year] Referring Provider (PT): Dr. Roland Rack   Encounter Date: 02/06/2021   PT End of Session - 02/06/21 1340     Visit Number 16    Number of Visits 17    Date for PT Re-Evaluation 02/11/21    Authorization - Visit Number 6    Authorization - Number of Visits 10    PT Start Time 6967    PT Stop Time 1132    PT Time Calculation (min) 64 min    Activity Tolerance Patient limited by pain;Patient tolerated treatment well    Behavior During Therapy [facility] for tasks assessed/performed             Past Medical History:  Diagnosis Date   Aortic atherosclerosis ([place])    Coronary artery disease 12/2019   a.) LHC 12/16/2019: EF 55-65%, LVEDP norm. 99% p-mRCA, 20% mRCA. --> PCI placing a 3.0 x 22 mm Resolute Onyx DES to pRCA.   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    HLD (hyperlipidemia)    Hypertension    Long term current use of antithrombotics/antiplatelets    a.) DAPT (ASA + ticagrelor)   Osteoarthritis    Seasonal allergies    T2DM (type 2 diabetes mellitus) ([place])     Past Surgical History:  Procedure Laterality Date   ARTHOSCOPIC ROTAOR CUFF REPAIR Left 02/25/2018   Procedure: LEFT ARTHROSCOPIC ROTATOR CUFF REPAIR; SUBACROMIAL DECOMPRESSION;  Surgeon: Tania Ade, MD;  [facility]: [facility];  Service: Orthopedics;  Laterality: Left;   CATARACT EXTRACTION W/PHACO Right 09/03/2016   Procedure: CATARACT EXTRACTION PHACO AND INTRAOCULAR LENS PLACEMENT ([place]) RIGHT DIABETIC;  Surgeon: Leandrew Koyanagi, MD;  Location: [place];  Service: Ophthalmology;  Laterality: Right;  DIABETIC   COLONOSCOPY WITH PROPOFOL N/A 08/29/2019   Procedure: COLONOSCOPY  WITH PROPOFOL;  Surgeon: Lin Landsman, MD;  Location: [facility] END[facility];  Service: Gastroenterology;  Laterality: N/A;   CORONARY STENT INTERVENTION N/A 12/16/2019   Procedure: CORONARY STENT INTERVENTION (3.0 x 22 mm Resolute Onyx DES to pRCA);  Surgeon: Wellington Hampshire, MD;  Location: [place] [facility];  Service: Cardiovascular;  Laterality: N/A;   LEFT HEART CATH AND CORONARY ANGIOGRAPHY N/A 12/16/2019   Procedure: LEFT HEART CATH AND CORONARY ANGIOGRAPHY;  Surgeon: Wellington Hampshire, MD;  Location: [place] [facility];  Service: Cardiovascular;  Laterality: N/A;   ROTATOR CUFF REPAIR Right 01/07/2007   TOTAL KNEE ARTHROPLASTY Left 12/13/2020   Procedure: TOTAL KNEE ARTHROPLASTY;  Surgeon: Corky Mull, MD;  Location: [facility];  Service: Orthopedics;  Laterality: Left;   VASECTOMY N/A     There were no vitals filed for this visit.   Subjective Assessment - 02/06/21 1337     Subjective Pt. reports a rough night of sleep and has been up since 1AM.  Pt. presents with an increase in L knee swelling as compared to R.  Pt. entered PT with no assistive device and moderate L antalgic gait with limited full knee extension.    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 Pt. 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  Pt. known well to skilled PT services.  Pt. has good  caregiver assist for daily household tasks/ driving.    Limitations Standing;Walking;House hold activities    Patient Stated Goals Increase L knee AROM/ strength to improve pain-free mobility and return to work.    Currently in Pain? Yes   no subjective pain score   Pain Location Knee    Pain Orientation Left    Pain Descriptors / Indicators Constant             Therex.:    Nustep L4, seat 9-7 for 10 min. B UE/LE (warm-up/ non-billed).    Seated contract relax 2x10 knee flexion.   Prone contract relax into knee extension. 2x5   Prone knee hang with heat applied to distal  hamstrings with 3# ankle wt. Pt. Is limited by pain but shows improvement to relax into the stretch.      Manual tx.:   Seated L knee PROM with static holds at 92 deg. 3x 30 sec. (Pain limited).    Supine L knee AA/PROM flexion 10x each.  Pain limited at <90 deg.   Supine Superior patellar mobs. During active knee extension 2x5   Ice to L knee for 10 minutes at end of session     PT Short Term Goals - 01/16/21 1248       PT SHORT TERM GOAL #1   Baseline --               PT Long Term Goals - 01/16/21 1122       PT LONG TERM GOAL #1   Title Pt. will increase FOTO to 45 to improve pain-free moblity with daily tasks.    Baseline FOTO: initial: 12.  1/11: 56    Time 8    Period Weeks    Status Achieved    Target Date 01/16/21      PT LONG TERM GOAL #2   Title Pt. will increase L knee AROM to 0->115 deg. to improve walking/ stairs/ return to work.    Baseline R knee AROM: -1 to 128 deg. and strength grossly 5/5 MMT. Pt. pain limited with L knee A/PROM: extensino -12/-10 deg., flexion 64/72 deg. (guarded/ pain limted).  1/11: supine L knee (-11 to 86 deg.),  seated L knee flexion 88 deg.    Time 8    Period Weeks    Status Not Met    Target Date 02/11/21      PT LONG TERM GOAL #3   Title Pt. will stand from chair with proper technique/ no UE assist to improve pain-free mobility.    Baseline 9/10 L knee pain with sit to stands    Time 8    Period Weeks    Status Partially Met    Target Date 02/11/21      PT LONG TERM GOAL #4   Title Pt. will ambulate community distances with normalized gait pattern/ no assistive device to improve return to work.    Baseline Pt. ambulates with limited L LE wt. bearing and heavy UE assist on RW. Limited R LE step length during L LE weight bearing due to pain.    Time 8    Period Weeks    Status Partially Met    Target Date 02/11/21      PT LONG TERM GOAL #5   Title Pt. will ascend/ descend stairs with recip. pattern and no  handrail to improve functional mobility.    Baseline Heavy UE assist with recip. gait pattern    Time 8  Period Weeks    Status Partially Met    Target Date 02/11/21                   Plan - 02/06/21 1340     Clinical Impression Statement Pt. is showing slow progress in his ability to actively ext. his L knee and will greatly benefit from manual therapy/ continuing to push the L knee into new ranges. Pt. is highly motivated as next MD visit is approaching and his goal is to approach 105 deg of L knee flexion. Pt. will continue to benefit from therex. to fatigue his LE muscles and then push his L knee into newer ranges with static holds which can translate to an increase in his functional ROM.    Stability/Clinical Decision Making Evolving/Moderate complexity    Clinical Decision Making Moderate    Rehab Potential Good    PT Frequency 2x / week    PT Duration 8 weeks    PT Treatment/Interventions ADLs/Self Care Home Management;Cryotherapy;Electrical Stimulation;Functional mobility training;Therapeutic activities;Therapeutic exercise;Patient/family education;Neuromuscular re-education;Manual techniques;Scar mobilization;Passive range of motion;Stair training;Gait training;DME Instruction;Balance training    PT Next Visit Plan Progress L knee ROM.  Recheck L knee measurments.  SEND MD PROGRESS NOTE    PT Home Exercise Plan Access Code: [email or web address]    Consulted and Agree with Plan of Care Patient             Patient will benefit from skilled therapeutic intervention in order to improve the following deficits and impairments:  Decreased mobility, Decreased strength, Impaired flexibility, Hypomobility, Pain, Abnormal gait, Decreased balance, Difficulty walking, Decreased range of motion, Decreased scar mobility, Decreased activity tolerance  Visit Diagnosis: S/P total knee replacement, left  Joint stiffness of knee, left  Acute pain of left knee     Problem List Patient  Active Problem List   Diagnosis Date Noted   Complex tear of medial meniscus of left knee as current injury 08/22/2020   Primary osteoarthritis of left knee 06/06/2020   Left knee pain 03/29/2020   Primary insomnia 03/29/2020   Encounter for medication titration 02/09/2020   Unstable angina ([facility])    Chest discomfort 12/12/2019   History of colonic polyps    Annual physical exam 07/01/2019   ED (erectile dysfunction) of organic origin 02/21/2015   Diabetes mellitus type 2 with complications, uncontrolled 10/18/2014   Hyperlipemia 10/18/2014   Pura Spice, PT, DPT # Lillie, SPT 02/06/2021, 2:17 PM  [facility] [address] [place], [place], [address] Phone: [phone]   Fax:  [phone]  Name: Curtis Singh MRN: [id number] Date of Birth: 09-06-1955

## 2021-02-11 ENCOUNTER — Encounter: Payer: Self-pay | Admitting: Physical Therapy

## 2021-02-11 ENCOUNTER — Other Ambulatory Visit: Payer: Self-pay

## 2021-02-11 ENCOUNTER — Ambulatory Visit: Payer: No Typology Code available for payment source | Admitting: Physical Therapy

## 2021-02-11 DIAGNOSIS — M25662 Stiffness of left knee, not elsewhere classified: Secondary | ICD-10-CM

## 2021-02-11 DIAGNOSIS — Z96652 Presence of left artificial knee joint: Secondary | ICD-10-CM

## 2021-02-11 DIAGNOSIS — M25562 Pain in left knee: Secondary | ICD-10-CM

## 2021-02-11 NOTE — Therapy (Addendum)
[facility] [address] [place], [place], [address] Phone: [phone]   Fax:  [phone]  Physical Therapy Treatment  Patient Details  Name: Curtis Singh MRN: [id number] Date of Birth: 11-Mar-1955 Referring Provider (PT): Dr. Roland Rack   Encounter Date: 02/11/2021   PT End of Session - 02/11/21 1030     Visit Number 17    Number of Visits 17    Date for PT Re-Evaluation 02/11/21    Authorization - Visit Number 7    Authorization - Number of Visits 10    PT Start Time 1022    PT Stop Time 1120    PT Time Calculation (min) 58 min    Activity Tolerance Patient limited by pain;Patient tolerated treatment well    Behavior During Therapy [facility] for tasks assessed/performed             Past Medical History:  Diagnosis Date   Aortic atherosclerosis ([place])    Coronary artery disease 12/2019   a.) LHC 12/16/2019: EF 55-65%, LVEDP norm. 99% p-mRCA, 20% mRCA. --> PCI placing a 3.0 x 22 mm Resolute Onyx DES to pRCA.   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    HLD (hyperlipidemia)    Hypertension    Long term current use of antithrombotics/antiplatelets    a.) DAPT (ASA + ticagrelor)   Osteoarthritis    Seasonal allergies    T2DM (type 2 diabetes mellitus) ([place])     Past Surgical History:  Procedure Laterality Date   ARTHOSCOPIC ROTAOR CUFF REPAIR Left 02/25/2018   Procedure: LEFT ARTHROSCOPIC ROTATOR CUFF REPAIR; SUBACROMIAL DECOMPRESSION;  Surgeon: Tania Ade, MD;  [facility];  Service: Orthopedics;  Laterality: Left;   CATARACT EXTRACTION W/PHACO Right 09/03/2016   Procedure: CATARACT EXTRACTION PHACO AND INTRAOCULAR LENS PLACEMENT ([place]) RIGHT DIABETIC;  Surgeon: Leandrew Koyanagi, MD;  Location: [place];  Service: Ophthalmology;  Laterality: Right;  DIABETIC   COLONOSCOPY WITH PROPOFOL N/A 08/29/2019   Procedure: COLONOSCOPY  WITH PROPOFOL;  Surgeon: Lin Landsman, MD;  Location: [facility];  Service: Gastroenterology;  Laterality: N/A;   CORONARY STENT INTERVENTION N/A 12/16/2019   Procedure: CORONARY STENT INTERVENTION (3.0 x 22 mm Resolute Onyx DES to pRCA);  Surgeon: Wellington Hampshire, MD;  Location: [place] [facility];  Service: Cardiovascular;  Laterality: N/A;   LEFT HEART CATH AND CORONARY ANGIOGRAPHY N/A 12/16/2019   Procedure: LEFT HEART CATH AND CORONARY ANGIOGRAPHY;  Surgeon: Wellington Hampshire, MD;  Location: [place] [facility];  Service: Cardiovascular;  Laterality: N/A;   ROTATOR CUFF REPAIR Right 01/07/2007   TOTAL KNEE ARTHROPLASTY Left 12/13/2020   Procedure: TOTAL KNEE ARTHROPLASTY;  Surgeon: Corky Mull, MD;  Location: [facility];  Service: Orthopedics;  Laterality: Left;   VASECTOMY N/A     There were no vitals filed for this visit.   Subjective Assessment - 02/11/21 1028     Subjective Pt. reports increase in pain and swelling of L knee after an active day on Saturday. Pt. continues to require no AD and is able to get around with a slight antalgic gait.    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 Pt. 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  Pt. known well to skilled PT services.  Pt. has good caregiver assist for daily household tasks/ driving.    Limitations Standing;Walking;House  hold activities    Patient Stated Goals Increase L knee AROM/ strength to improve pain-free mobility and return to work.    Currently in Pain? Yes    Pain Score 4     Pain Location Knee    Pain Orientation Left              Therex.:    Nustep L4, seat 9-7 for 10 min. B UE/LE (warm-up/ non-billed).    Knee flexion/extension stretch on second step. 3x20-30 secodns each  TG- static squat hold. 2x5. Pt. Limited by pain   Seated contract relax 2x10 knee flexion.    Prone contract relax into knee extension. 2x5   Prone knee hang with heat applied to  distal hamstrings  Pt. Is limited by pain but shows improvement to relax into the stretch.      Manual tx.:   Seated L knee PROM with static holds at 92 deg. 3x 30 sec. (Pain limited).     Supine L knee AA/PROM flexion 10x each.  Pain limited at <90 deg.   Prone AAROM L knee flexion. Pt. Has increase pain past 90 deg.   Ice to L knee for 10 minutes at end of session       PT Long Term Goals - 01/16/21 1122       PT LONG TERM GOAL #1   Title Pt. will increase FOTO to 45 to improve pain-free moblity with daily tasks.    Baseline FOTO: initial: 12.  1/11: 56    Time 8    Period Weeks    Status Achieved    Target Date 01/16/21      PT LONG TERM GOAL #2   Title Pt. will increase L knee AROM to 0->115 deg. to improve walking/ stairs/ return to work.    Baseline R knee AROM: -1 to 128 deg. and strength grossly 5/5 MMT. Pt. pain limited with L knee A/PROM: extensino -12/-10 deg., flexion 64/72 deg. (guarded/ pain limted).  1/11: supine L knee (-11 to 86 deg.),  seated L knee flexion 88 deg.    Time 8    Period Weeks    Status Not Met    Target Date 02/11/21      PT LONG TERM GOAL #3   Title Pt. will stand from chair with proper technique/ no UE assist to improve pain-free mobility.    Baseline 9/10 L knee pain with sit to stands    Time 8    Period Weeks    Status Partially Met    Target Date 02/11/21      PT LONG TERM GOAL #4   Title Pt. will ambulate community distances with normalized gait pattern/ no assistive device to improve return to work.    Baseline Pt. ambulates with limited L LE wt. bearing and heavy UE assist on RW. Limited R LE step length during L LE weight bearing due to pain.    Time 8    Period Weeks    Status Partially Met    Target Date 02/11/21      PT LONG TERM GOAL #5   Title Pt. will ascend/ descend stairs with recip. pattern and no handrail to improve functional mobility.    Baseline Heavy UE assist with recip. gait pattern    Time 8    Period  Weeks    Status Partially Met    Target Date 02/11/21  Plan - 02/11/21 1137     Clinical Impression Statement Pt. will continue to benefit from manual interventions to push his L knee into new ranges. Pt. is progressing his L knee extension to a more functional level so his gait will be less antalgic when in L stance phase. Pt. is still having a large increase in pain when L knee flexion is pushed past 90 deg. Pt. is motivated to push but voices concern he will not be able to reach 105 deg. of L knee flexion by 02/27/21. Pt. wants to continue to push into new ranges for the next 2 weeks and hopes he will be able to hit is AROM goals. PT. educates on the need to perform static stretches to help increase his L knee AROM as well as adding in STM techniques to help the LE muscles stretch more when Pt. is performing AROM.    Stability/Clinical Decision Making Evolving/Moderate complexity    Clinical Decision Making Moderate    Rehab Potential Good    PT Frequency 2x / week    PT Duration 8 weeks    PT Treatment/Interventions ADLs/Self Care Home Management;Cryotherapy;Electrical Stimulation;Functional mobility training;Therapeutic activities;Therapeutic exercise;Patient/family education;Neuromuscular re-education;Manual techniques;Scar mobilization;Passive range of motion;Stair training;Gait training;DME Instruction;Balance training    PT Next Visit Plan Progress L knee ROM.  Recheck L knee measurments.  RECERT next tx.    PT Home Exercise Plan Access Code: [email or web address]    Consulted and Agree with Plan of Care Patient             Patient will benefit from skilled therapeutic intervention in order to improve the following deficits and impairments:  Decreased mobility, Decreased strength, Impaired flexibility, Hypomobility, Pain, Abnormal gait, Decreased balance, Difficulty walking, Decreased range of motion, Decreased scar mobility, Decreased activity tolerance  Visit Diagnosis: S/P  total knee replacement, left  Joint stiffness of knee, left  Acute pain of left knee     Problem List Patient Active Problem List   Diagnosis Date Noted   Complex tear of medial meniscus of left knee as current injury 08/22/2020   Primary osteoarthritis of left knee 06/06/2020   Left knee pain 03/29/2020   Primary insomnia 03/29/2020   Encounter for medication titration 02/09/2020   Unstable angina ([facility])    Chest discomfort 12/12/2019   History of colonic polyps    Annual physical exam 07/01/2019   ED (erectile dysfunction) of organic origin 02/21/2015   Diabetes mellitus type 2 with complications, uncontrolled 10/18/2014   Hyperlipemia 10/18/2014   Pura Spice, PT, DPT # [id number] Cleopatra Cedar, SPT 02/11/2021, 12:18 PM  [facility] [address] [place], [place], [address] Phone: [phone]   Fax:  [phone]  Name: Curtis Singh MRN: [id number] Date of Birth: [birth year]

## 2021-02-13 ENCOUNTER — Other Ambulatory Visit: Payer: Self-pay

## 2021-02-13 ENCOUNTER — Encounter: Payer: Self-pay | Admitting: Physical Therapy

## 2021-02-13 ENCOUNTER — Ambulatory Visit: Payer: No Typology Code available for payment source | Admitting: Physical Therapy

## 2021-02-13 ENCOUNTER — Other Ambulatory Visit: Payer: Self-pay | Admitting: Internal Medicine

## 2021-02-13 DIAGNOSIS — M25662 Stiffness of left knee, not elsewhere classified: Secondary | ICD-10-CM

## 2021-02-13 DIAGNOSIS — Z96652 Presence of left artificial knee joint: Secondary | ICD-10-CM | POA: Diagnosis not present

## 2021-02-13 DIAGNOSIS — M25562 Pain in left knee: Secondary | ICD-10-CM

## 2021-02-13 MED ORDER — OZEMPIC (0.25 OR 0.5 MG/DOSE) 2 MG/1.5ML ~~LOC~~ SOPN
0.5000 mg | PEN_INJECTOR | SUBCUTANEOUS | 2 refills | Status: DC
  Filled 2021-02-13: qty 1.5, 28d supply, fill #0
  Filled 2021-03-15: qty 3, 56d supply, fill #1
  Filled 2021-03-15: qty 1.5, 28d supply, fill #1

## 2021-02-13 NOTE — Therapy (Addendum)
[facility] [address]. [place], [place], [address] Phone: [phone]   Fax:  [phone]  Physical Therapy Treatment  Patient Details  Name: Curtis Singh MRN: [id number] Date of Birth: 26-Nov-1955 Referring Provider (PT): Dr. Roland Rack   Encounter Date: 02/13/2021   PT End of Session - 02/13/21 1034     Visit Number 17    Number of Visits 25    Date for PT Re-Evaluation 03/13/21    Authorization - Visit Number 8    Authorization - Number of Visits 10    PT Start Time 7673    PT Stop Time 1120    PT Time Calculation (min) 57 min    Activity Tolerance Patient limited by pain;Patient tolerated treatment well    Behavior During Therapy [facility] for tasks assessed/performed             Past Medical History:  Diagnosis Date   Aortic atherosclerosis ([place])    Coronary artery disease 12/2019   a.) LHC 12/16/2019: EF 55-65%, LVEDP norm. 99% p-mRCA, 20% mRCA. --> PCI placing a 3.0 x 22 mm Resolute Onyx DES to pRCA.   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    HLD (hyperlipidemia)    Hypertension    Long term current use of antithrombotics/antiplatelets    a.) DAPT (ASA + ticagrelor)   Osteoarthritis    Seasonal allergies    T2DM (type 2 diabetes mellitus) ([place])     Past Surgical History:  Procedure Laterality Date   ARTHOSCOPIC ROTAOR CUFF REPAIR Left 02/25/2018   Procedure: LEFT ARTHROSCOPIC ROTATOR CUFF REPAIR; SUBACROMIAL DECOMPRESSION;  Surgeon: Tania Ade, MD;  Location: [facility];  Service: Orthopedics;  Laterality: Left;   CATARACT EXTRACTION W/PHACO Right 09/03/2016   Procedure: CATARACT EXTRACTION PHACO AND INTRAOCULAR LENS PLACEMENT ([place]) RIGHT DIABETIC;  Surgeon: Leandrew Koyanagi, MD;  Location: [place];  Service: Ophthalmology;  Laterality: Right;  DIABETIC   COLONOSCOPY WITH PROPOFOL N/A 08/29/2019   Procedure: COLONOSCOPY  WITH PROPOFOL;  Surgeon: Lin Landsman, MD;  Location: [facility];  Service: Gastroenterology;  Laterality: N/A;   CORONARY STENT INTERVENTION N/A 12/16/2019   Procedure: CORONARY STENT INTERVENTION (3.0 x 22 mm Resolute Onyx DES to pRCA);  Surgeon: Wellington Hampshire, MD;  Location: [place] [facility];  Service: Cardiovascular;  Laterality: N/A;   LEFT HEART CATH AND CORONARY ANGIOGRAPHY N/A 12/16/2019   Procedure: LEFT HEART CATH AND CORONARY ANGIOGRAPHY;  Surgeon: Wellington Hampshire, MD;  Location: [place] [facility];  Service: Cardiovascular;  Laterality: N/A;   ROTATOR CUFF REPAIR Right 01/07/2007   TOTAL KNEE ARTHROPLASTY Left 12/13/2020   Procedure: TOTAL KNEE ARTHROPLASTY;  Surgeon: Corky Mull, MD;  Location: [facility];  Service: Orthopedics;  Laterality: Left;   VASECTOMY N/A     There were no vitals filed for this visit.   Subjective Assessment - 02/13/21 1031     Subjective Pt. reports constant pain since last session but describes it as a "soreness" after pushing into new PROM. Pt. is motivated to continue to push prior MD visit.    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 Pt. 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  Pt. known well to skilled PT services.  Pt. has good caregiver assist for daily household tasks/ driving.    Limitations Standing;Walking;House hold activities  Patient Stated Goals Increase L knee AROM/ strength to improve pain-free mobility and return to work.    Currently in Pain? Yes    Pain Score 4     Pain Location Knee    Pain Orientation Left    Pain Descriptors / Indicators Aching              Therex.:    Nustep L4, seat 9-7 for 10 min. B UE/LE (warm-up/ non-billed).     Knee flexion/extension stretch on second step. 3x20-30 secodns each. PT. Assisted knee extension with strap.   TG- static squat hold. 2x5. Pt. Limited by pain  Standing TKE 2x10 BlueTB   Seated contract relax 2x5 knee  flexion.    Prone  L knee flexion stretch with strap. 5x Pt. Severely limited by pain.    Prone knee hang with heat applied to distal hamstrings  Pt. Is limited by pain but shows improvement to relax into the stretch.      Manual tx.:   Seated L knee PROM with static holds maximal range(Pain limited).     Supine L knee AA/PROM flexion 10x each.  Pain limited at <90 deg.   Prone AAROM L knee flexion. Pt. Has increase pain past 90 deg.   Ice to L knee for 10 minutes at end of session     PT Long Term Goals - 02/13/21 1228       PT LONG TERM GOAL #1   Title Pt. will increase FOTO to 45 to improve pain-free moblity with daily tasks.    Baseline FOTO: initial: 12.  1/11: 56    Time 8    Period Weeks    Status Achieved    Target Date 01/16/21      PT LONG TERM GOAL #2   Title Pt. will increase L knee AROM to 0->115 deg. to improve walking/ stairs/ return to work.    Baseline R knee AROM: -1 to 128 deg. and strength grossly 5/5 MMT. Pt. pain limited with L knee A/PROM: extensino -12/-10 deg., flexion 64/72 deg. (guarded/ pain limted).  1/11: supine L knee (-11 to 86 deg.),  seated L knee flexion 88 deg.    Time 8    Period Weeks    Status Partially Met    Target Date 03/13/21      PT LONG TERM GOAL #3   Title Pt. will stand from chair with proper technique/ no UE assist to improve pain-free mobility.    Baseline 9/10 L knee pain with sit to stands    Time 8    Period Weeks    Status Partially Met    Target Date 03/13/21      PT LONG TERM GOAL #4   Title Pt. will ambulate community distances with normalized gait pattern/ no assistive device to improve return to work.    Baseline Pt. ambulates with limited L LE wt. bearing and heavy UE assist on RW. Limited R LE step length during L LE weight bearing due to pain.    Time 8    Period Weeks    Status Partially Met    Target Date 03/13/21      PT LONG TERM GOAL #5   Title Pt. will ascend/ descend stairs with recip. pattern  and no handrail to improve functional mobility.    Baseline Heavy UE assist with recip. gait pattern    Time 8    Period Weeks    Status Partially Met  Target Date 03/13/21      Additional Long Term Goals   Additional Long Term Goals Yes      PT LONG TERM GOAL #6   Title Pt. will be able to return to work and perform functional activities with <2 increase in NPS.    Baseline Pain increases to >6 when performing funcational activites. resting NPS 3/10    Time 4    Period Weeks    Status New    Target Date 03/13/21                   Plan - 02/13/21 1252     Clinical Impression Statement Pt. is slow but steady progress in L knee extension and flexion.  Pt. will continue to benefit from AROM in L knee extension to allow for a more functional gait pattern.  Pt. is unable to fully extend L knee during stance phase of gait causing a limp when the R LE is in swing phase. Pt. is highly motivated to increase overall L knee ROM prior to MD appt. on 02/26/21. Pt. is progressing to being able to push into new ranges passively (-3 to 93 deg. after manual tx.) while not being as severely pain limited. Pt. will continue to work on PROM in L knee and progress to increased AROM for functional utility during his ADLs.    Stability/Clinical Decision Making Evolving/Moderate complexity    Clinical Decision Making Moderate    Rehab Potential Good    PT Frequency 2x / week    PT Duration 8 weeks    PT Treatment/Interventions ADLs/Self Care Home Management;Cryotherapy;Electrical Stimulation;Functional mobility training;Therapeutic activities;Therapeutic exercise;Patient/family education;Neuromuscular re-education;Manual techniques;Scar mobilization;Passive range of motion;Stair training;Gait training;DME Instruction;Balance training    PT Next Visit Plan Progress L knee ROM.  Recheck L knee measurments.    PT Home Exercise Plan Access Code: [email or web address]    Consulted and Agree with Plan of Care Patient              Patient will benefit from skilled therapeutic intervention in order to improve the following deficits and impairments:  Decreased mobility, Decreased strength, Impaired flexibility, Hypomobility, Pain, Abnormal gait, Decreased balance, Difficulty walking, Decreased range of motion, Decreased scar mobility, Decreased activity tolerance  Visit Diagnosis: S/P total knee replacement, left  Joint stiffness of knee, left  Acute pain of left knee     Problem List Patient Active Problem List   Diagnosis Date Noted   Complex tear of medial meniscus of left knee as current injury 08/22/2020   Primary osteoarthritis of left knee 06/06/2020   Left knee pain 03/29/2020   Primary insomnia 03/29/2020   Encounter for medication titration 02/09/2020   Unstable angina ([facility])    Chest discomfort 12/12/2019   History of colonic polyps    Annual physical exam 07/01/2019   ED (erectile dysfunction) of organic origin 02/21/2015   Diabetes mellitus type 2 with complications, uncontrolled 10/18/2014   Hyperlipemia 10/18/2014   Pura Spice, PT, DPT # [place], SPT 02/13/2021, 2:30 PM  [facility] [address] [place], [place], [address] Phone: [phone]   Fax:  [phone]  Name: JABIER DEESE MRN: [id number] Date of Birth: 1955-11-13

## 2021-02-14 ENCOUNTER — Other Ambulatory Visit: Payer: Self-pay

## 2021-02-18 ENCOUNTER — Encounter: Payer: Self-pay | Admitting: Physical Therapy

## 2021-02-18 ENCOUNTER — Ambulatory Visit: Payer: No Typology Code available for payment source | Admitting: Physical Therapy

## 2021-02-18 ENCOUNTER — Other Ambulatory Visit: Payer: Self-pay

## 2021-02-18 DIAGNOSIS — R269 Unspecified abnormalities of gait and mobility: Secondary | ICD-10-CM

## 2021-02-18 DIAGNOSIS — M25662 Stiffness of left knee, not elsewhere classified: Secondary | ICD-10-CM

## 2021-02-18 DIAGNOSIS — Z96652 Presence of left artificial knee joint: Secondary | ICD-10-CM | POA: Diagnosis not present

## 2021-02-18 NOTE — Therapy (Addendum)
[facility] [address] [place], [place], [address] Phone: [phone]   Fax:  [phone]  Physical Therapy Treatment  Patient Details  Name: Curtis Singh MRN: [id number] Date of Birth: Mar 13, 1955 Referring Provider (PT): Dr. Roland Rack   Encounter Date: 02/18/2021   PT End of Session - 02/18/21 1022     Visit Number 18    Number of Visits 25    Date for PT Re-Evaluation 03/13/21    Authorization - Visit Number 9    Authorization - Number of Visits 10    PT Start Time 1017    PT Stop Time 1115    PT Time Calculation (min) 58 min    Activity Tolerance Patient limited by pain;Patient tolerated treatment well    Behavior During Therapy [facility] for tasks assessed/performed             Past Medical History:  Diagnosis Date   Aortic atherosclerosis ([place])    Coronary artery disease 12/2019   a.) LHC 12/16/2019: EF 55-65%, LVEDP norm. 99% p-mRCA, 20% mRCA. --> PCI placing a 3.0 x 22 mm Resolute Onyx DES to pRCA.   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    HLD (hyperlipidemia)    Hypertension    Long term current use of antithrombotics/antiplatelets    a.) DAPT (ASA + ticagrelor)   Osteoarthritis    Seasonal allergies    T2DM (type 2 diabetes mellitus) ([place])     Past Surgical History:  Procedure Laterality Date   ARTHOSCOPIC ROTAOR CUFF REPAIR Left 02/25/2018   Procedure: LEFT ARTHROSCOPIC ROTATOR CUFF REPAIR; SUBACROMIAL DECOMPRESSION;  Surgeon: Tania Ade, MD;  [facility];  Service: Orthopedics;  Laterality: Left;   CATARACT EXTRACTION W/PHACO Right 09/03/2016   Procedure: CATARACT EXTRACTION PHACO AND INTRAOCULAR LENS PLACEMENT ([place]) RIGHT DIABETIC;  Surgeon: Leandrew Koyanagi, MD;  Location: [place];  Service: Ophthalmology;  Laterality: Right;  DIABETIC   COLONOSCOPY WITH PROPOFOL N/A 08/29/2019   Procedure: COLONOSCOPY  WITH PROPOFOL;  Surgeon: Lin Landsman, MD;  Location: [facility];  Service: Gastroenterology;  Laterality: N/A;   CORONARY STENT INTERVENTION N/A 12/16/2019   Procedure: CORONARY STENT INTERVENTION (3.0 x 22 mm Resolute Onyx DES to pRCA);  Surgeon: Wellington Hampshire, MD;  Location: [place] [facility];  Service: Cardiovascular;  Laterality: N/A;   LEFT HEART CATH AND CORONARY ANGIOGRAPHY N/A 12/16/2019   Procedure: LEFT HEART CATH AND CORONARY ANGIOGRAPHY;  Surgeon: Wellington Hampshire, MD;  Location: [place] [facility];  Service: Cardiovascular;  Laterality: N/A;   ROTATOR CUFF REPAIR Right 01/07/2007   TOTAL KNEE ARTHROPLASTY Left 12/13/2020   Procedure: TOTAL KNEE ARTHROPLASTY;  Surgeon: Corky Mull, MD;  Location: [facility];  Service: Orthopedics;  Laterality: Left;   VASECTOMY N/A     There were no vitals filed for this visit.   Subjective Assessment - 02/18/21 1021     Subjective Pt. reports an increase in stiffness this morning post 30 minutes of ice. Pt. expresses concern that his L knee AROM will not increase prior to MD. Pt. feels he has plateaued over the past few weeks.    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 Pt. 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  Pt. known well to skilled PT services.  Pt. has good caregiver assist for daily household tasks/ driving.  Limitations Standing;Walking;House hold activities    Patient Stated Goals Increase L knee AROM/ strength to improve pain-free mobility and return to work.    Currently in Pain? Yes    Pain Score 4     Pain Location Knee    Pain Orientation Left    Pain Type Acute pain             Therex.:    Nustep L4, seat 9-7 for 10 min. B UE/LE (warm-up/ non-billed).     Knee flexion/extension stretch on second step. 3x20-30 seconds each.    TG- static squat hold. 2x5. Pt. Limited by pain   Seated contract relax 2x5 knee flexion.    Prone  L knee flexion stretch with  strap. 5x Pt. Severely limited by pain.   Prostretch in //-bars 3x20 secs.  Limited hamstring/ gastroc flexibility.   Prone knee hang with heat applied to distal hamstrings  Pt. Is limited by pain but shows improvement to relax into the stretch.      Manual tx.:     Supine L knee AA/PROM flexion 10x each.  Pain limited at <90 deg.   Prone AAROM L knee flexion. Pt. Has severe pain past 90 deg.  STM distal L hamstrings post MHP.   Ice to L knee for 10 minutes at end of session    PT Short Term Goals - 01/16/21 1248       PT SHORT TERM GOAL #1   Baseline --               PT Long Term Goals - 02/13/21 1228       PT LONG TERM GOAL #1   Title Pt. will increase FOTO to 45 to improve pain-free moblity with daily tasks.    Baseline FOTO: initial: 12.  1/11: 56    Time 8    Period Weeks    Status Achieved    Target Date 01/16/21      PT LONG TERM GOAL #2   Title Pt. will increase L knee AROM to 0->115 deg. to improve walking/ stairs/ return to work.    Baseline R knee AROM: -1 to 128 deg. and strength grossly 5/5 MMT. Pt. pain limited with L knee A/PROM: extensino -12/-10 deg., flexion 64/72 deg. (guarded/ pain limted).  1/11: supine L knee (-11 to 86 deg.),  seated L knee flexion 88 deg.    Time 8    Period Weeks    Status Partially Met    Target Date 03/13/21      PT LONG TERM GOAL #3   Title Pt. will stand from chair with proper technique/ no UE assist to improve pain-free mobility.    Baseline 9/10 L knee pain with sit to stands    Time 8    Period Weeks    Status Partially Met    Target Date 03/13/21      PT LONG TERM GOAL #4   Title Pt. will ambulate community distances with normalized gait pattern/ no assistive device to improve return to work.    Baseline Pt. ambulates with limited L LE wt. bearing and heavy UE assist on RW. Limited R LE step length during L LE weight bearing due to pain.    Time 8    Period Weeks    Status Partially Met    Target Date  03/13/21      PT LONG TERM GOAL #5   Title Pt. will ascend/ descend stairs with recip. pattern and  no handrail to improve functional mobility.    Baseline Heavy UE assist with recip. gait pattern    Time 8    Period Weeks    Status Partially Met    Target Date 03/13/21      Additional Long Term Goals   Additional Long Term Goals Yes      PT LONG TERM GOAL #6   Title Pt. will be able to return to work and perform functional activities with <2 increase in NPS.    Baseline Pain increases to >6 when performing funcational activites. resting NPS 3/10    Time 4    Period Weeks    Status New    Target Date 03/13/21                   Plan - 02/18/21 1517     Clinical Impression Statement Pt. presents with an increase in L knee stiffness that is limiting his AROM during the tx. session. AROM of L knee was -10 to 90 deg. Pt reports severe pain at >90 deg. of L knee flexion. Pt. stresses concern his L knee is no longer going to be able to attain functional AROM. Pt. has been progressing slowly but is making progress with his PROM and can attain -3 deg of prone L knee extension when the PT is applying pressure. Pt. will continue to benefit from pushing his AROM so that he will be able to functionally extend his L knee during his gait pattern and flex more than 90 deg while ascending/descending stairs.    Stability/Clinical Decision Making Evolving/Moderate complexity    Clinical Decision Making Moderate    Rehab Potential Good    PT Frequency 2x / week    PT Duration 8 weeks    PT Treatment/Interventions ADLs/Self Care Home Management;Cryotherapy;Electrical Stimulation;Functional mobility training;Therapeutic activities;Therapeutic exercise;Patient/family education;Neuromuscular re-education;Manual techniques;Scar mobilization;Passive range of motion;Stair training;Gait training;DME Instruction;Balance training    PT Next Visit Plan Progress L knee ROM.  Recheck L knee measurments.     PT Home Exercise Plan Access Code: [email or web address]    Consulted and Agree with Plan of Care Patient             Patient will benefit from skilled therapeutic intervention in order to improve the following deficits and impairments:  Decreased mobility, Decreased strength, Impaired flexibility, Hypomobility, Pain, Abnormal gait, Decreased balance, Difficulty walking, Decreased range of motion, Decreased scar mobility, Decreased activity tolerance  Visit Diagnosis: S/P total knee replacement, left  Joint stiffness of knee, left  Gait difficulty     Problem List Patient Active Problem List   Diagnosis Date Noted   Complex tear of medial meniscus of left knee as current injury 08/22/2020   Primary osteoarthritis of left knee 06/06/2020   Left knee pain 03/29/2020   Primary insomnia 03/29/2020   Encounter for medication titration 02/09/2020   Unstable angina (HCC)    Chest discomfort 12/12/2019   History of colonic polyps    Annual physical exam 07/01/2019   ED (erectile dysfunction) of organic origin 02/21/2015   Diabetes mellitus type 2 with complications, uncontrolled 10/18/2014   Hyperlipemia 10/18/2014   Pura Spice, PT, DPT # [place], SPT 02/18/2021, 3:51 PM  [facility] [address] [place], [place], [address] Phone: [phone]   Fax:  [phone]  Name: ORA MCNATT MRN: [id number] Date of Birth: 1955-11-22

## 2021-02-20 ENCOUNTER — Other Ambulatory Visit: Payer: Self-pay | Admitting: Internal Medicine

## 2021-02-20 ENCOUNTER — Other Ambulatory Visit: Payer: Self-pay

## 2021-02-20 ENCOUNTER — Other Ambulatory Visit: Payer: Self-pay | Admitting: Cardiovascular Disease

## 2021-02-20 ENCOUNTER — Ambulatory Visit: Payer: No Typology Code available for payment source | Admitting: Physical Therapy

## 2021-02-20 DIAGNOSIS — I10 Essential (primary) hypertension: Secondary | ICD-10-CM

## 2021-02-20 DIAGNOSIS — M25662 Stiffness of left knee, not elsewhere classified: Secondary | ICD-10-CM

## 2021-02-20 DIAGNOSIS — M6281 Muscle weakness (generalized): Secondary | ICD-10-CM

## 2021-02-20 DIAGNOSIS — Z96652 Presence of left artificial knee joint: Secondary | ICD-10-CM

## 2021-02-20 DIAGNOSIS — M25562 Pain in left knee: Secondary | ICD-10-CM

## 2021-02-20 DIAGNOSIS — R269 Unspecified abnormalities of gait and mobility: Secondary | ICD-10-CM

## 2021-02-20 MED ORDER — LOSARTAN POTASSIUM 25 MG PO TABS
ORAL_TABLET | Freq: Every day | ORAL | 1 refills | Status: DC
  Filled 2021-02-20: qty 90, 90d supply, fill #0
  Filled 2021-05-27: qty 90, 90d supply, fill #1

## 2021-02-20 MED ORDER — ZOLPIDEM TARTRATE 10 MG PO TABS
10.0000 mg | ORAL_TABLET | Freq: Every evening | ORAL | 1 refills | Status: DC | PRN
  Filled 2021-02-21: qty 30, 30d supply, fill #0

## 2021-02-20 MED FILL — Metformin HCl Tab ER 24HR 500 MG: ORAL | 90 days supply | Qty: 180 | Fill #0 | Status: AC

## 2021-02-21 ENCOUNTER — Other Ambulatory Visit: Payer: Self-pay

## 2021-02-22 ENCOUNTER — Other Ambulatory Visit: Payer: Self-pay | Admitting: Internal Medicine

## 2021-02-22 ENCOUNTER — Other Ambulatory Visit: Payer: Self-pay

## 2021-02-22 MED ORDER — ZOLPIDEM TARTRATE 10 MG PO TABS
ORAL_TABLET | ORAL | 1 refills | Status: DC
  Filled 2021-02-22: qty 30, 30d supply, fill #0

## 2021-02-25 ENCOUNTER — Other Ambulatory Visit: Payer: Self-pay

## 2021-02-25 ENCOUNTER — Ambulatory Visit: Payer: No Typology Code available for payment source | Admitting: Physical Therapy

## 2021-02-25 DIAGNOSIS — Z96652 Presence of left artificial knee joint: Secondary | ICD-10-CM

## 2021-02-25 DIAGNOSIS — M25662 Stiffness of left knee, not elsewhere classified: Secondary | ICD-10-CM

## 2021-02-25 DIAGNOSIS — M6281 Muscle weakness (generalized): Secondary | ICD-10-CM

## 2021-02-25 DIAGNOSIS — R269 Unspecified abnormalities of gait and mobility: Secondary | ICD-10-CM

## 2021-02-25 DIAGNOSIS — M25562 Pain in left knee: Secondary | ICD-10-CM

## 2021-02-25 NOTE — Therapy (Addendum)
[facility] [address] [place], [place], [address] Phone: [phone]   Fax:  [phone]  Physical Therapy Treatment Physical Therapy Progress Note   Dates of reporting period 01/21/21 to 02/25/21   Patient Details  Name: Curtis Singh MRN: [id number] Date of Birth: 07-May-1955 Referring Provider (PT): Dr. Roland Rack   Encounter Date: 02/25/2021   PT End of Session - 02/25/21 1519     Visit Number 19    Number of Visits 25    Date for PT Re-Evaluation 03/13/21    Authorization - Visit Number 10    Authorization - Number of Visits 10    PT Start Time 1024    PT Stop Time 6269    PT Time Calculation (min) 54 min    Activity Tolerance Patient limited by pain;Patient tolerated treatment well    Behavior During Therapy [facility] for tasks assessed/performed             Past Medical History:  Diagnosis Date   Aortic atherosclerosis ([place])    Coronary artery disease 12/2019   a.) LHC 12/16/2019: EF 55-65%, LVEDP norm. 99% p-mRCA, 20% mRCA. --> PCI placing a 3.0 x 22 mm Resolute Onyx DES to pRCA.   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    HLD (hyperlipidemia)    Hypertension    Long term current use of antithrombotics/antiplatelets    a.) DAPT (ASA + ticagrelor)   Osteoarthritis    Seasonal allergies    T2DM (type 2 diabetes mellitus) ([place])     Past Surgical History:  Procedure Laterality Date   ARTHOSCOPIC ROTAOR CUFF REPAIR Left 02/25/2018   Procedure: LEFT ARTHROSCOPIC ROTATOR CUFF REPAIR; SUBACROMIAL DECOMPRESSION;  Surgeon: Tania Ade, MD;  Location: [facility];  Service: Orthopedics;  Laterality: Left;   CATARACT EXTRACTION W/PHACO Right 09/03/2016   Procedure: CATARACT EXTRACTION PHACO AND INTRAOCULAR LENS PLACEMENT ([place]) RIGHT DIABETIC;  Surgeon: Leandrew Koyanagi, MD;  Location: [place];  Service: Ophthalmology;  Laterality:  Right;  DIABETIC   COLONOSCOPY WITH PROPOFOL N/A 08/29/2019   Procedure: COLONOSCOPY WITH PROPOFOL;  Surgeon: Lin Landsman, MD;  Location: [facility]COPY;  Service: Gastroenterology;  Laterality: N/A;   CORONARY STENT INTERVENTION N/A 12/16/2019   Procedure: CORONARY STENT INTERVENTION (3.0 x 22 mm Resolute Onyx DES to pRCA);  Surgeon: Wellington Hampshire, MD;  Location: [place] [facility];  Service: Cardiovascular;  Laterality: N/A;   LEFT HEART CATH AND CORONARY ANGIOGRAPHY N/A 12/16/2019   Procedure: LEFT HEART CATH AND CORONARY ANGIOGRAPHY;  Surgeon: Wellington Hampshire, MD;  Location: [place] [facility];  Service: Cardiovascular;  Laterality: N/A;   ROTATOR CUFF REPAIR Right 01/07/2007   TOTAL KNEE ARTHROPLASTY Left 12/13/2020   Procedure: TOTAL KNEE ARTHROPLASTY;  Surgeon: Corky Mull, MD;  Location: [facility];  Service: Orthopedics;  Laterality: Left;   VASECTOMY N/A     There were no vitals filed for this visit.   Therex.:    Nustep L4, seat 9-7 for 10 min. B UE/LE (warm-up/ non-billed).     Knee flexion/extension stretch on second step. 3x20-30 seconds each.    TG- static squat hold. 2x5. Pt. Limited by pain   Seated contract relax 2x5 knee flexion.        Manual tx.:     Supine L knee AA/PROM flexion 10x each.  Pain limited at <90 deg.   Prone AAROM  L knee flexion. Pt. Has severe pain past 90 deg. (L knee -11 to 92 deg.)   Ice to L knee for 10 minutes at end of session   MMT R/L Knee flexion 5/4+ Knee extension 5/5 Hip flexion 4+/4 Hip abduction 5/5     PT Long Term Goals - 02/25/21 1522       PT LONG TERM GOAL #1   Title Pt. will increase FOTO to 45 to improve pain-free moblity with daily tasks.    Baseline FOTO: initial: 12.  1/11: 56.  2/20: 69    Time 8    Period Weeks    Status Achieved    Target Date 02/25/21      PT LONG TERM GOAL #2   Title Pt. will increase L knee AROM to 0->115 deg. to improve walking/ stairs/ return to work.     Baseline R knee AROM: -1 to 128 deg. and strength grossly 5/5 MMT. Pt. pain limited with L knee A/PROM: extensino -12/-10 deg., flexion 64/72 deg. (guarded/ pain limted).  1/11: supine L knee (-11 to 86 deg.),  seated L knee flexion 88 deg.  2/20: -11 to 92 deg. L knee AROM.    Time 8    Period Weeks    Status Partially Met    Target Date 03/13/21      PT LONG TERM GOAL #3   Title Pt. will stand from chair with proper technique/ no UE assist to improve pain-free mobility.    Baseline 9/10 L knee pain with sit to stands    Time 8    Period Weeks    Status Partially Met    Target Date 03/13/21      PT LONG TERM GOAL #4   Title Pt. will ambulate community distances with normalized gait pattern/ no assistive device to improve return to work.    Baseline Pt. ambulates with limited L LE wt. bearing and heavy UE assist on RW. Limited R LE step length during L LE weight bearing due to pain.    Time 8    Period Weeks    Status Partially Met    Target Date 03/13/21      PT LONG TERM GOAL #5   Title Pt. will ascend/ descend stairs with recip. pattern and no handrail to improve functional mobility.    Baseline Heavy UE assist with recip. gait pattern    Time 8    Period Weeks    Status Partially Met    Target Date 03/13/21      PT LONG TERM GOAL #6   Title Pt. will be able to return to work and perform functional activities with <2 increase in NPS.    Baseline Pain increases to >6 when performing funcational activites. resting NPS 3/10.   2/20: 4/10 L knee pain reported    Time 4    Period Weeks    Status Partially Met    Target Date 03/13/21                   Plan - 02/25/21 1227     Clinical Impression Statement Pt. has been extremely motivated and compliant with PT; he has come to the clinic 3-4 times/week for the past month and has participated in all exercises prescribed by the PT. Pt. has continued to struggle with increasing his AROM in his L knee making it hard to  normalize his functional gait pattern. Pt. has almost congruent strength when comparing LE's (see flowsheet)  but has major AROM deficits. Pts. R knee AROM is -2 to 119 deg. while L knee is -11 to 92. Pt. would greatly benefit from increasing his AROM so that he can more appropriately perform all his functional ADLs. Pt. has severe pain with L knee manual stretching/ PROM >90 deg. which has limited progress over past month. LEFS score of 37/80. Pt. has MD f/u 02/27/21 to determine the next steps in his POC.  Pt. is hoping to return to work on Thursday 02/28/21.    Stability/Clinical Decision Making Evolving/Moderate complexity    Clinical Decision Making Moderate    Rehab Potential Good    PT Frequency 2x / week    PT Duration 8 weeks    PT Treatment/Interventions ADLs/Self Care Home Management;Cryotherapy;Electrical Stimulation;Functional mobility training;Therapeutic activities;Therapeutic exercise;Patient/family education;Neuromuscular re-education;Manual techniques;Scar mobilization;Passive range of motion;Stair training;Gait training;DME Instruction;Balance training    PT Next Visit Plan Progress L knee ROM.    PT Home Exercise Plan Access Code: [email or web address]    [email or web address] and Agree with Plan of Care Patient             Patient will benefit from skilled therapeutic intervention in order to improve the following deficits and impairments:  Decreased mobility, Decreased strength, Impaired flexibility, Hypomobility, Pain, Abnormal gait, Decreased balance, Difficulty walking, Decreased range of motion, Decreased scar mobility, Decreased activity tolerance  Visit Diagnosis: S/P total knee replacement, left  Joint stiffness of knee, left  Gait difficulty  Acute pain of left knee  Muscle weakness (generalized)     Problem List Patient Active Problem List   Diagnosis Date Noted   Complex tear of medial meniscus of left knee as current injury 08/22/2020   Primary osteoarthritis of left knee  06/06/2020   Left knee pain 03/29/2020   Primary insomnia 03/29/2020   Encounter for medication titration 02/09/2020   Unstable angina ([facility])    Chest discomfort 12/12/2019   History of colonic polyps    Annual physical exam 07/01/2019   ED (erectile dysfunction) of organic origin 02/21/2015   Diabetes mellitus type 2 with complications, uncontrolled 10/18/2014   Hyperlipemia 10/18/2014   Pura Spice, PT, DPT # [place], SPT 02/25/2021, 3:26 PM  [facility] [address] [place], [place], [address] Phone: [phone]   Fax:  [phone]  Name: GLENN GULLICKSON MRN: [id number] Date of Birth: 1955/04/18

## 2021-02-25 NOTE — Therapy (Signed)
[facility] [address] [place], [place], [address] Phone: [phone]   Fax:  [phone]  Physical Therapy Treatment  Patient Details  Name: Curtis Singh MRN: [id number] Date of Birth: 1955-07-28 Referring Provider (PT): Dr. Roland Rack   Encounter Date: 02/20/2021   PT End of Session - 02/25/21 0809     Visit Number 18    Number of Visits 25    Date for PT Re-Evaluation 03/13/21    Authorization - Visit Number 9    Authorization - Number of Visits 10    Activity Tolerance Patient limited by pain;Patient tolerated treatment well    Behavior During Therapy [facility] for tasks assessed/performed             Past Medical History:  Diagnosis Date   Aortic atherosclerosis ([place])    Coronary artery disease 12/2019   a.) LHC 12/16/2019: EF 55-65%, LVEDP norm. 99% p-mRCA, 20% mRCA. --> PCI placing a 3.0 x 22 mm Resolute Onyx DES to pRCA.   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    HLD (hyperlipidemia)    Hypertension    Long term current use of antithrombotics/antiplatelets    a.) DAPT (ASA + ticagrelor)   Osteoarthritis    Seasonal allergies    T2DM (type 2 diabetes mellitus) ([place])     Past Surgical History:  Procedure Laterality Date   ARTHOSCOPIC ROTAOR CUFF REPAIR Left 02/25/2018   Procedure: LEFT ARTHROSCOPIC ROTATOR CUFF REPAIR; SUBACROMIAL DECOMPRESSION;  Surgeon: Tania Ade, MD;  [facility]: [facility];  Service: Orthopedics;  Laterality: Left;   CATARACT EXTRACTION W/PHACO Right 09/03/2016   Procedure: CATARACT EXTRACTION PHACO AND INTRAOCULAR LENS PLACEMENT ([place]) RIGHT DIABETIC;  Surgeon: Leandrew Koyanagi, MD;  Location: [place];  Service: Ophthalmology;  Laterality: Right;  DIABETIC   COLONOSCOPY WITH PROPOFOL N/A 08/29/2019   Procedure: COLONOSCOPY WITH PROPOFOL;  Surgeon: Lin Landsman, MD;  Location: [facility];   Service: Gastroenterology;  Laterality: N/A;   CORONARY STENT INTERVENTION N/A 12/16/2019   Procedure: CORONARY STENT INTERVENTION (3.0 x 22 mm Resolute Onyx DES to pRCA);  Surgeon: Wellington Hampshire, MD;  Location: [place] [facility];  Service: Cardiovascular;  Laterality: N/A;   LEFT HEART CATH AND CORONARY ANGIOGRAPHY N/A 12/16/2019   Procedure: LEFT HEART CATH AND CORONARY ANGIOGRAPHY;  Surgeon: Wellington Hampshire, MD;  Location: [place] [facility];  Service: Cardiovascular;  Laterality: N/A;   ROTATOR CUFF REPAIR Right 01/07/2007   TOTAL KNEE ARTHROPLASTY Left 12/13/2020   Procedure: TOTAL KNEE ARTHROPLASTY;  Surgeon: Corky Mull, MD;  Location: [facility];  Service: Orthopedics;  Laterality: Left;   VASECTOMY N/A     There were no vitals filed for this visit.         No charge for tx. Session.  Pt. Rode Nustep for 10+ min. and participated with independent stretches/ HEP.  Pt. Returns to MD next week.       PT Short Term Goals - 01/16/21 1248       PT SHORT TERM GOAL #1   Baseline --               PT Long Term Goals - 02/13/21 1228       PT LONG TERM GOAL #1   Title Pt. will increase FOTO to 45 to improve pain-free moblity with daily tasks.    Baseline FOTO: initial: 12.  1/11: 56    Time 8    Period Weeks    Status Achieved    Target Date 01/16/21      PT LONG TERM GOAL #2   Title Pt. will increase L knee AROM to 0->115 deg. to improve walking/ stairs/ return to work.    Baseline R knee AROM: -1 to 128 deg. and strength grossly 5/5 MMT. Pt. pain limited with L knee A/PROM: extensino -12/-10 deg., flexion 64/72 deg. (guarded/ pain limted).  1/11: supine L knee (-11 to 86 deg.),  seated L knee flexion 88 deg.    Time 8    Period Weeks    Status Partially Met    Target Date 03/13/21      PT LONG TERM GOAL #3   Title Pt. will stand from chair with proper technique/ no UE assist to improve pain-free mobility.    Baseline 9/10 L knee pain with sit to stands     Time 8    Period Weeks    Status Partially Met    Target Date 03/13/21      PT LONG TERM GOAL #4   Title Pt. will ambulate community distances with normalized gait pattern/ no assistive device to improve return to work.    Baseline Pt. ambulates with limited L LE wt. bearing and heavy UE assist on RW. Limited R LE step length during L LE weight bearing due to pain.    Time 8    Period Weeks    Status Partially Met    Target Date 03/13/21      PT LONG TERM GOAL #5   Title Pt. will ascend/ descend stairs with recip. pattern and no handrail to improve functional mobility.    Baseline Heavy UE assist with recip. gait pattern    Time 8    Period Weeks    Status Partially Met    Target Date 03/13/21      Additional Long Term Goals   Additional Long Term Goals Yes      PT LONG TERM GOAL #6   Title Pt. will be able to return to work and perform functional activities with <2 increase in NPS.    Baseline Pain increases to >6 when performing funcational activites. resting NPS 3/10    Time 4    Period Weeks    Status New    Target Date 03/13/21                    Patient will benefit from skilled therapeutic intervention in order to improve the following deficits and impairments:     Visit Diagnosis: S/P total knee replacement, left  Joint stiffness of knee, left  Gait difficulty  Acute pain of left knee  Muscle weakness (generalized)     Problem List Patient Active Problem List   Diagnosis Date Noted   Complex tear of medial meniscus of left knee as current injury 08/22/2020   Primary osteoarthritis of left knee 06/06/2020   Left knee pain 03/29/2020   Primary insomnia 03/29/2020   Encounter for medication titration 02/09/2020   Unstable angina ([place])    Chest discomfort 12/12/2019   History of colonic polyps    Annual physical exam 07/01/2019   ED (erectile dysfunction) of organic origin 02/21/2015   Diabetes mellitus type 2 with complications,  uncontrolled 10/18/2014   Hyperlipemia 10/18/2014   Pura Spice, PT, DPT # [id number] 02/25/2021, 8:09 AM  [place]  [address] [place], [place], [address] Phone: [phone]   Fax:  [phone]  Name: ANURAG SCARFO MRN: [id number] Date of Birth: 18-Jan-1955

## 2021-02-26 ENCOUNTER — Encounter: Payer: Self-pay | Admitting: *Deleted

## 2021-02-27 ENCOUNTER — Other Ambulatory Visit: Payer: Self-pay | Admitting: Surgery

## 2021-02-27 ENCOUNTER — Encounter: Payer: No Typology Code available for payment source | Admitting: Physical Therapy

## 2021-03-01 ENCOUNTER — Telehealth: Payer: Self-pay | Admitting: *Deleted

## 2021-03-01 ENCOUNTER — Other Ambulatory Visit: Payer: Self-pay

## 2021-03-01 ENCOUNTER — Encounter
Admission: RE | Admit: 2021-03-01 | Discharge: 2021-03-01 | Disposition: A | Discharge status: Home / Self Care | Payer: No Typology Code available for payment source | Source: Ambulatory Visit | Attending: Surgery | Admitting: Surgery

## 2021-03-01 NOTE — Patient Instructions (Signed)
Your procedure is scheduled on:03-06-21 Wednesday Report to the Registration Desk on the 1st floor of the [place].Then proceed to the 2nd floor Surgery Desk in the [place] To find out your arrival time, please call [phone] between 1PM - 3PM on:03-05-21 Tuesday  REMEMBER: Instructions that are not followed completely may result in serious medical risk, up to and including death; or upon the discretion of your surgeon and anesthesiologist your surgery may need to be rescheduled.  Do not eat food after midnight the night before surgery.  No gum chewing, lozengers or hard candies.  You may however, drink Water up to 2 hours before you are scheduled to arrive for your surgery. Do not drink anything within 2 hours of your scheduled arrival time  Type 1 and Type 2 diabetics should only drink water.  In addition, your doctor has ordered for you to drink the provided  Gatorade G2 Drinking this carbohydrate drink up to two hours before surgery helps to reduce insulin resistance, nausea and improve patient outcomes. Please complete drinking 2 hours prior to scheduled arrival time.  TAKE THESE MEDICATIONS THE MORNING OF SURGERY WITH A SIP OF WATER: -omeprazole (PRILOSEC)  Stop your metFORMIN (GLUCOPHAGE-XR) 2 days prior to surgery-Last dose on 03-03-21 Sunday  Call Dr Poggi's office Monday (03-04-21) about if you need to stop our 81 mg Aspirin that you take twice daily  One week prior to surgery: Stop Anti-inflammatories (NSAIDS) such as Advil, Aleve, Ibuprofen, Motrin, Naproxen, Naprosyn and Aspirin based products such as Excedrin, Goodys Powder, BC Powder.You may however, continue to take Tylenol/Tramadol if needed for pain up until the day of surgery.  Stop ANY OVER THE COUNTER supplements/vitamins NOW (03-01-21) until after surgery (Multivitamin)  No Alcohol for 24 hours before or after surgery.  No Smoking including e-cigarettes for 24 hours prior to surgery.  No chewable  tobacco products for at least 6 hours prior to surgery.  No nicotine patches on the day of surgery.  Do not use any "recreational" drugs for at least a week prior to your surgery.  Please be advised that the combination of cocaine and anesthesia may have negative outcomes, up to and including death. If you test positive for cocaine, your surgery will be cancelled.  On the morning of surgery brush your teeth with toothpaste and water, you may rinse your mouth with mouthwash if you wish. Do not swallow any toothpaste or mouthwash.  Do not wear jewelry, make-up, hairpins, clips or nail polish.  Do not wear lotions, powders, or perfumes.   Do not shave body from the neck down 48 hours prior to surgery just in case you cut yourself which could leave a site for infection.  Also, freshly shaved skin may become irritated if using the CHG soap.  Contact lenses, hearing aids and dentures may not be worn into surgery.  Do not bring valuables to the hospital. [facility] is not responsible for any missing/lost belongings or valuables.   Notify your doctor if there is any change in your medical condition (cold, fever, infection).  Wear comfortable clothing (specific to your surgery type) to the hospital.  After surgery, you can help prevent lung complications by doing breathing exercises.  Take deep breaths and cough every 1-2 hours. Your doctor may order a device called an Incentive Spirometer to help you take deep breaths. When coughing or sneezing, hold a pillow firmly against your incision with both hands. This is called splinting. Doing this helps protect your incision.  It also decreases belly discomfort.  If you are being admitted to the hospital overnight, leave your suitcase in the car. After surgery it may be brought to your room.  If you are being discharged the day of surgery, you will not be allowed to drive home. You will need a responsible adult (18 years or older) to drive you  home and stay with you that night.   If you are taking public transportation, you will need to have a responsible adult (18 years or older) with you. Please confirm with your physician that it is acceptable to use public transportation.   Please call the [employer] [facility] at [phone] if you have any questions about these instructions.  Surgery Visitation Policy:  Patients undergoing a surgery or procedure may have one family member or support person with them as long as that person is not COVID-19 positive or experiencing its symptoms.  That person may remain in the waiting area during the procedure and may rotate out with other people.  Inpatient Visitation:    Visiting hours are 7 a.m. to 8 p.m. Up to two visitors ages 16+ are allowed at one time in a patient room. The visitors may rotate out with other people during the day. Visitors must check out when they leave, or other visitors will not be allowed. One designated support person may remain overnight. The visitor must pass COVID-19 screenings, use hand sanitizer when entering and exiting the patients room and wear a mask at all times, including in the patients room. Patients must also wear a mask when staff or their visitor are in the room. Masking is required regardless of vaccination status.                      Your procedure is scheduled on: Report to the Registration Desk on the 1st floor of the [facility] [facility]. To find out your arrival time, please call [phone] between 1PM - 3PM on:  REMEMBER: Instructions that are not followed completely may result in serious medical risk, up to and including death; or upon the discretion of your surgeon and anesthesiologist your surgery may need to be rescheduled.  Do not eat food after midnight the night before surgery.  No gum chewing, lozengers or hard candies.  You may however, drink CLEAR liquids up to 2 hours before you are scheduled to arrive  for your surgery. Do not drink anything within 2 hours of your scheduled arrival time.  Clear liquids include: - water  - apple juice without pulp - gatorade (not RED colors) - black coffee or tea (Do NOT add milk or creamers to the coffee or tea) Do NOT drink anything that is not on this list.  Type 1 and Type 2 diabetics should only drink water.  In addition, your doctor has ordered for you to drink the provided  Ensure Pre-Surgery Clear Carbohydrate Drink  Gatorade G2 Drinking this carbohydrate drink up to two hours before surgery helps to reduce insulin resistance and improve patient outcomes. Please complete drinking 2 hours prior to scheduled arrival time.  TAKE THESE MEDICATIONS THE MORNING OF SURGERY WITH A SIP OF WATER:  (take one the night before and one on the morning of surgery - helps to prevent nausea after surgery.)  Use inhalers on the day of surgery and bring to the hospital.  **Follow new guidelines for insulin and diabetes medications.**  Follow recommendations from Cardiologist, Pulmonologist or PCP regarding stopping Aspirin, Coumadin,  Plavix, Eliquis, Pradaxa, or Pletal.  One week prior to surgery: Stop Anti-inflammatories (NSAIDS) such as Advil, Aleve, Ibuprofen, Motrin, Naproxen, Naprosyn and Aspirin based products such as Excedrin, Goodys Powder, BC Powder. Stop ANY OVER THE COUNTER supplements until after surgery. You may however, continue to take Tylenol if needed for pain up until the day of surgery.  No Alcohol for 24 hours before or after surgery.  No Smoking including e-cigarettes for 24 hours prior to surgery.  No chewable tobacco products for at least 6 hours prior to surgery.  No nicotine patches on the day of surgery.  Do not use any "recreational" drugs for at least a week prior to your surgery.  Please be advised that the combination of cocaine and anesthesia may have negative outcomes, up to and including death. If you test positive for  cocaine, your surgery will be cancelled.  On the morning of surgery brush your teeth with toothpaste and water, you may rinse your mouth with mouthwash if you wish. Do not swallow any toothpaste or mouthwash.  Use CHG Soap or wipes as directed on instruction sheet.  Do not wear jewelry, make-up, hairpins, clips or nail polish.  Do not wear lotions, powders, or perfumes.   Do not shave body from the neck down 48 hours prior to surgery just in case you cut yourself which could leave a site for infection.  Also, freshly shaved skin may become irritated if using the CHG soap.  Contact lenses, hearing aids and dentures may not be worn into surgery.  Do not bring valuables to the hospital. [facility] is not responsible for any missing/lost belongings or valuables.   Total Shoulder Arthroplasty:  use Benzolyl Peroxide 5% Gel as directed on instruction sheet.  Fleets enema or bowel prep as directed.  Bring your C-PAP to the hospital with you in case you may have to spend the night.   Notify your doctor if there is any change in your medical condition (cold, fever, infection).  Wear comfortable clothing (specific to your surgery type) to the hospital.  After surgery, you can help prevent lung complications by doing breathing exercises.  Take deep breaths and cough every 1-2 hours. Your doctor may order a device called an Incentive Spirometer to help you take deep breaths. When coughing or sneezing, hold a pillow firmly against your incision with both hands. This is called splinting. Doing this helps protect your incision. It also decreases belly discomfort.  If you are being admitted to the hospital overnight, leave your suitcase in the car. After surgery it may be brought to your room.  If you are being discharged the day of surgery, you will not be allowed to drive home. You will need a responsible adult (18 years or older) to drive you home and stay with you that night.   If you  are taking public transportation, you will need to have a responsible adult (18 years or older) with you. Please confirm with your physician that it is acceptable to use public transportation.   Please call the [facility] Dept. at [phone] if you have any questions about these instructions.  Surgery Visitation Policy:  Patients undergoing a surgery or procedure may have one family member or support person with them as long as that person is not COVID-19 positive or experiencing its symptoms.  That person may remain in the waiting area during the procedure and may rotate out with other people.  Inpatient Visitation:    Visiting hours  are 7 a.m. to 8 p.m. Up to two visitors ages 16+ are allowed at one time in a patient room. The visitors may rotate out with other people during the day. Visitors must check out when they leave, or other visitors will not be allowed. One designated support person may remain overnight. The visitor must pass COVID-19 screenings, use hand sanitizer when entering and exiting the patients room and wear a mask at all times, including in the patients room. Patients must also wear a mask when staff or their visitor are in the room. Masking is required regardless of vaccination status.

## 2021-03-01 NOTE — Telephone Encounter (Signed)
Request for pre-operative cardiac clearance Received: Today Karen Kitchens, NP  P Cv Div Preop Callback Request for pre-operative cardiac clearance:     1. What type of surgery is being performed?  Left knee manipulation and steroid injection   2. When is this surgery scheduled?  03/06/2021     3. Type of clearance being requested (medical, pharmacy, both).  MEDICAL     4. Are there any medications that need to be held prior to surgery?  ASA   5. Practice name and name of physician performing surgery?  Performing surgeon: Dr. Milagros Evener, MD  Requesting clearance: Curtis Loh, FNP-C       6. Anesthesia type (none, local, MAC, general)?  CHOICE   7. What is the office phone and fax number?    Phone: [phone]  Fax: [phone]   ATTENTION: Unable to create telephone message as per your standard workflow. Directed by [facility] providers to send requests for cardiac clearance to this pool for appropriate distribution to provider covering pre-operative clearances.   Curtis Loh, MSN, APRN, FNP-C, CEN  [facility]  Peri-operative Services Nurse Practitioner  Phone: [phone]  03/01/21 5:21 PM

## 2021-03-04 ENCOUNTER — Encounter: Payer: No Typology Code available for payment source | Admitting: Physical Therapy

## 2021-03-04 NOTE — Progress Notes (Signed)
°  Perioperative Services: Pre-Admission/Anesthesia Testing  Abnormal Lab Notification    Date: 03/04/21  Name: Curtis Singh MRN:   [id number]  Re: Abnormal labs noted during PAT appointment   Provider(s) Notified: Poggi, Marshall Cork, MD Notification mode: Routed and/or faxed via [place] LAB VALUE(S): Lab Results  Component Value Date   GLUCOSE 283 (H) 12/05/2020    Notes: Patient with a T2DM diagnosis. He is currently on both oral (metformin) and parenteral (semaglutide) therapies. Last Hgb A1c was 10.1% on 12/05/2020. In efforts to reduce his risk of developing SSI/PJI, or other potential perioperative complications, this communication is being sent in order to determine if patient is deemed to have adequate medical optimization, including preoperative glycemic control.    The odds ratio for SSI/PJI infection is between 2.8 and 3.4 for orthopedic surgery patients with pre-operative serum glucose levels of > 125 mg/dL or a post-operative levels of > 200 mg/dL ([place]owan, 2019).     Data suggests that a Hgb A1c threshold of 7.7% tends to be more indicative of infection than the commonly used 7% and should perhaps be the pre-operative patient optimization goal Carolin Guernsey et al., 2017).    With that being said, the benefit of improving glycemic control must be weighed against the overall risk associated with delaying a necessary elective orthopedic surgery for this patient.    Citations: Charlett Blake, A.F. Reducing the risk of infection after total joint arthroplasty: preoperative optimization. Arthroplasty 1, 4 (2019). [email or web address]  Lorrin Goodell MM, [place], Brigati D, [place], [address] [place] JC, [place], [place], [place], Parvizi Lenna Sciara [place]. Determining the Threshold for HbA1c as a Predictor for Adverse Outcomes After Total Joint Arthroplasty: A Multicenter, Retrospective Study. J Arthroplasty. 2017  Sep;32(9S):S263-S267.e1. [email or web address].   Honor Loh, MSN, APRN, FNP-C, CEN [facility]  Peri-operative Services Nurse Practitioner Phone: [phone] 03/04/21 2:42 PM

## 2021-03-04 NOTE — Progress Notes (Signed)
Perioperative Services  Pre-Admission/Anesthesia Testing Clinical Review  Date: 03/04/21  Patient Demographics:  Name: Curtis Singh DOB:   [birth year] MRN:   [id number]  Planned Surgical Procedure(s):    Case: [id number] Date/Time: 03/06/21 1317   Procedure: Left knee manipulation and steroid injection (Left: Knee)   Anesthesia type: Choice   Pre-op diagnosis: Arthrofibrosis of knee joint, left  M24.662   Location: [facility] OR ROOM 02 / [place] [facility] FOR ANESTHESIA GROUP   Surgeons: Corky Mull, MD   NOTE: Available PAT nursing documentation and vital signs have been reviewed. Clinical nursing staff has updated patient's PMH/PSHx, current medication list, and drug allergies/intolerances to ensure comprehensive history available to assist in medical decision making as it pertains to the aforementioned surgical procedure and anticipated anesthetic course. Extensive review of available clinical information performed. [place] PMH and PSHx updated with any diagnoses/procedures that  may have been inadvertently omitted during his intake with the pre-admission testing department's nursing staff.  Clinical Discussion:  Curtis Singh is a 66 y.o. male who is submitted for pre-surgical anesthesia review and clearance prior to him undergoing the above procedure. Patient is a Former Smoker (quit 01/1982). Pertinent PMH includes: CAD, aortic atherosclerosis, HTN, HLD, T2DM, GERD (on daily PPI), ED (on PDE5i and exogenous testosterone), OA.   Patient is followed by cardiology Curtis Anon, MD). He was last seen in the cardiology clinic on 12/11/2020; notes reviewed.  At the time of his clinic visit, patient doing well overall from a cardiovascular perspective.  He denied any episodes of chest pain, however he was experiencing occasional dyspnea in the mornings when waking up.  He denied any PND, orthopnea, palpitations, significant peripheral edema, vertiginous symptoms, or presyncope/syncope.  PMH significant for  cardiovascular diagnoses.  Myocardial perfusion imaging study performed on 03/03/2019 revealed a normal left ventricular systolic function with an EF of 60%.  There were nonspecific ST/T wave abnormalities in the precordial and inferior leads.  CT attenuation correction images revealed mild aortic sclerosis and mild mid RCA coronary calcifications.  There were no regional wall motion abnormalities.  No significant stress-induced myocardial ischemia. Study determined to be low risk.  Diagnostic left heart catheterization was performed on 12/16/2019 revealing a normal left ventricular systolic function with an EF of 55 to 65%.  LVEDP normal.  There was a 99% stenosis of the proximal to mid RCA and 20% stenosis of the mid RCA.  PCI was subsequently performed placing a 3.0 x 22 mm Resolute Onyx DES x 1 to the proximal RCA resulting in 0% residual stenosis and TIMI-3 flow.  Blood pressure mildly elevated at 140/74 on currently prescribed ARB and beta-blocker therapies.  Patient is on a statin for his HLD.  T2DM uncontrolled on currently prescribed regimen; last hemoglobin A1c significantly elevated at 10.1% when checked on 11/04/2020. Patient is on PDE5i medications and exogenous testosterone for his ED diagnosis. Functional capacity, as defined by DASI, is documented as being >/= 4 METS.  No changes were made to his medication regimen.  Patient follow-up with outpatient cardiology in 4 months or sooner if needed.  Curtis Singh is scheduled for an elective LEFT KNEE MANIPULATION AND STEROID INJECTION on 03/06/2021 with Dr. Milagros Evener, MD. Given patient's past medical history significant for cardiovascular diagnoses, presurgical cardiac clearance was sought by the PAT team. Per cardiology, "based ACC/AHA guidelines, the patient's past medical history, and the amount of time since his last clinic visit, this patient would be at an overall ACCEPTABLE risk for the  planned procedure without further cardiovascular  testing or intervention at this time".  In review of his medication reconciliation, it is noted the patient is on daily antiplatelet therapy only at this time.  Per primary attending surgeon, patient may continue his daily low-dose ASA throughout the perioperative course.  Patient denies previous perioperative complications with anesthesia in the past. In review of the available records, it is noted that patient underwent a neuraxial anesthetic course here (ASA III) in 12/2020 without documented complications.   Vitals with BMI 12/13/2020 12/13/2020 12/13/2020  Height - - -  Weight - - -  BMI - - -  Systolic 161 096 045  Diastolic 76 75 76  Pulse 60 66 65    Providers/Specialists:   NOTE: Primary physician provider listed below. Patient may have been seen by APP or partner within same practice.   PROVIDER ROLE / SPECIALTY LAST OV  Poggi, Marshall Cork, MD Orthopedics 02/27/2021  Cletis Athens, MD Primary Care Provider 10/02/2020  [name], MD Cardiology 12/11/2020   Allergies:  Latex  Current Home Medications:   No current facility-administered medications for this encounter.    acetaminophen (TYLENOL) 500 MG tablet   amoxicillin (AMOXIL) 500 MG capsule   amoxicillin (AMOXIL) 500 MG capsule   apixaban (ELIQUIS) 2.5 MG TABS tablet   aspirin EC 81 MG tablet   Blood Glucose Monitoring Suppl (FREESTYLE FREEDOM LITE) w/Device KIT   fluticasone (FLONASE) 50 MCG/ACT nasal spray   losartan (COZAAR) 25 MG tablet   metFORMIN (GLUCOPHAGE-XR) 500 MG 24 hr tablet   metoprolol succinate (TOPROL-XL) 50 MG 24 hr tablet   Multiple Vitamin (MULTIVITAMIN WITH MINERALS) TABS tablet   omeprazole (PRILOSEC) 20 MG capsule   oxyCODONE (OXY IR/ROXICODONE) 5 MG immediate release tablet   oxyCODONE (OXY IR/ROXICODONE) 5 MG immediate release tablet   oxyCODONE (ROXICODONE) 5 MG immediate release tablet   rosuvastatin (CRESTOR) 40 MG tablet   Semaglutide,0.25 or 0.5MG/DOS, (OZEMPIC, 0.25 OR 0.5  MG/DOSE,) 2 MG/1.5ML SOPN   sildenafil (VIAGRA) 100 MG tablet   tadalafil (CIALIS) 5 MG tablet   testosterone cypionate (DEPOTESTOSTERONE CYPIONATE) 200 MG/ML injection   traMADol (ULTRAM) 50 MG tablet   traMADol (ULTRAM) 50 MG tablet   UNIFINE PENTIPS 29G X 12MM MISC   zolpidem (AMBIEN) 10 MG tablet   History:   Past Medical History:  Diagnosis Date   Aortic atherosclerosis (HCC)    Coronary artery disease 12/2019   a.) LHC 12/16/2019: EF 55-65%, LVEDP norm. 99% p-mRCA, 20% mRCA. --> PCI placing a 3.0 x 22 mm Resolute Onyx DES to pRCA.   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    HLD (hyperlipidemia)    Hypertension    Long term current use of antithrombotics/antiplatelets    a.) DAPT (ASA + ticagrelor)   Osteoarthritis    Seasonal allergies    T2DM (type 2 diabetes mellitus) ([place])    Past Surgical History:  Procedure Laterality Date   ARTHOSCOPIC ROTAOR CUFF REPAIR Left 02/25/2018   Procedure: LEFT ARTHROSCOPIC ROTATOR CUFF REPAIR; SUBACROMIAL DECOMPRESSION;  Surgeon: Tania Ade, MD;  Location: [facility];  Service: Orthopedics;  Laterality: Left;   CATARACT EXTRACTION W/PHACO Right 09/03/2016   Procedure: CATARACT EXTRACTION PHACO AND INTRAOCULAR LENS PLACEMENT ([place]) RIGHT DIABETIC;  Surgeon: Leandrew Koyanagi, MD;  Location: [place];  Service: Ophthalmology;  Laterality: Right;  DIABETIC   COLONOSCOPY WITH PROPOFOL N/A 08/29/2019   Procedure: COLONOSCOPY  WITH PROPOFOL;  Surgeon: Lin Landsman, MD;  Location: [facility] ENDOSCOPY;  Service: Gastroenterology;  Laterality: N/A;   CORONARY STENT INTERVENTION N/A 12/16/2019   Procedure: CORONARY STENT INTERVENTION (3.0 x 22 mm Resolute Onyx DES to pRCA);  Surgeon: Wellington Hampshire, MD;  Location: [facility];  Service: Cardiovascular;  Laterality: N/A;   LEFT HEART CATH AND CORONARY ANGIOGRAPHY N/A 12/16/2019   Procedure: LEFT  HEART CATH AND CORONARY ANGIOGRAPHY;  Surgeon: Wellington Hampshire, MD;  Location: [place] [facility];  Service: Cardiovascular;  Laterality: N/A;   ROTATOR CUFF REPAIR Right 01/07/2007   TOTAL KNEE ARTHROPLASTY Left 12/13/2020   Procedure: TOTAL KNEE ARTHROPLASTY;  Surgeon: Corky Mull, MD;  Location: [facility] ORS;  Service: Orthopedics;  Laterality: Left;   VASECTOMY N/A    Family History  Problem Relation Age of Onset   Heart attack Father    Heart attack Maternal Uncle    Heart attack Maternal Uncle    Heart attack Maternal Uncle    Heart attack Maternal Uncle    Colon cancer Neg Hx    Esophageal cancer Neg Hx    Rectal cancer Neg Hx    Stomach cancer Neg Hx    Kidney cancer Neg Hx    Kidney disease Neg Hx    Prostate cancer Neg Hx    Urolithiasis Neg Hx    Social History   Tobacco Use   Smoking status: Former    Types: Cigarettes    Quit date: 01/06/1982    Years since quitting: 39.1   Smokeless tobacco: Never  Vaping Use   Vaping Use: Never used  Substance Use Topics   Alcohol use: Yes    Alcohol/week: 24.0 standard drinks    Types: 24 Cans of beer per week    Comment: 1 case on weekend   Drug use: No    Pertinent Clinical Results:  LABS: Labs reviewed: Acceptable for surgery.  Hospital Outpatient Visit on 12/05/2020  Component Date Value Ref Range Status   MRSA, PCR 12/05/2020 NEGATIVE  NEGATIVE Final   Staphylococcus aureus 12/05/2020 NEGATIVE  NEGATIVE Final   Comment: (NOTE) The Xpert SA Assay (FDA approved for NASAL specimens in patients 65 years of age and older), is one component of a comprehensive surveillance program. It is not intended to diagnose infection nor to guide or monitor treatment. Performed at [facility], [address] [place], [place] [address]   WBC 12/05/2020 7.2  4.0 - 10.5 K/uL Final   RBC 12/05/2020 4.91  4.22 - 5.81 MIL/uL Final   Hemoglobin 12/05/2020 15.1  13.0 - 17.0 g/dL Final   HCT 12/05/2020 45.4  39.0 - 52.0 % Final    MCV 12/05/2020 92.5  80.0 - 100.0 fL Final   MCH 12/05/2020 30.8  26.0 - 34.0 pg Final   MCHC 12/05/2020 33.3  30.0 - 36.0 g/dL Final   RDW 12/05/2020 12.1  11.5 - 15.5 % Final   Platelets 12/05/2020 286  150 - 400 K/uL Final   nRBC 12/05/2020 0.0  0.0 - 0.2 % Final   Neutrophils Relative % 12/05/2020 58  % Final   Neutro Abs 12/05/2020 4.3  1.7 - 7.7 K/uL Final   Lymphocytes Relative 12/05/2020 30  % Final   Lymphs Abs 12/05/2020 2.2  0.7 - 4.0 K/uL Final   Monocytes Relative 12/05/2020 8  % Final   Monocytes Absolute 12/05/2020 0.6  0.1 - 1.0 K/uL Final   Eosinophils Relative 12/05/2020 2  %  Final   Eosinophils Absolute 12/05/2020 0.1  0.0 - 0.5 K/uL Final   Basophils Relative 12/05/2020 1  % Final   Basophils Absolute 12/05/2020 0.1  0.0 - 0.1 K/uL Final   Immature Granulocytes 12/05/2020 1  % Final   Abs Immature Granulocytes 12/05/2020 0.07  0.00 - 0.07 K/uL Final   Performed at [facility], [place], [place] [address]   Sodium 12/05/2020 135  135 - 145 mmol/L Final   Potassium 12/05/2020 3.7  3.5 - 5.1 mmol/L Final   Chloride 12/05/2020 104  98 - 111 mmol/L Final   CO2 12/05/2020 25  22 - 32 mmol/L Final   Glucose, Bld 12/05/2020 283 (H)  70 - 99 mg/dL Final   Glucose reference range applies only to samples taken after fasting for at least 8 hours.   BUN 12/05/2020 13  8 - 23 mg/dL Final   Creatinine, Ser 12/05/2020 0.77  0.61 - 1.24 mg/dL Final   Calcium 12/05/2020 9.2  8.9 - 10.3 mg/dL Final   Total Protein 12/05/2020 6.9  6.5 - 8.1 g/dL Final   Albumin 12/05/2020 4.5  3.5 - 5.0 g/dL Final   AST 12/05/2020 23  15 - 41 U/L Final   ALT 12/05/2020 31  0 - 44 U/L Final   Alkaline Phosphatase 12/05/2020 60  38 - 126 U/L Final   Total Bilirubin 12/05/2020 0.9  0.3 - 1.2 mg/dL Final   GFR, Estimated 12/05/2020 >60  >60 mL/min Final   Comment: (NOTE) Calculated using the CKD-EPI Creatinine Equation (2021)   Anion gap 12/05/2020 6  5 - 15 Final    Performed at [facility], [address] [place], [place] [address]   Color, Urine 12/05/2020 YELLOW  YELLOW Final   Appearance 12/05/2020 CLEAR  CLEAR Final   Specific Gravity, Urine 12/05/2020 1.015  1.005 - 1.030 Final   pH 12/05/2020 5.5  5.0 - 8.0 Final   Glucose, UA 12/05/2020 >1,000 (A)  NEGATIVE mg/dL Final   Hgb urine dipstick 12/05/2020 NEGATIVE  NEGATIVE Final   Bilirubin Urine 12/05/2020 NEGATIVE  NEGATIVE Final   Ketones, ur 12/05/2020 NEGATIVE  NEGATIVE mg/dL Final   Protein, ur 12/05/2020 NEGATIVE  NEGATIVE mg/dL Final   Nitrite 12/05/2020 NEGATIVE  NEGATIVE Final   Leukocytes,Ua 12/05/2020 NEGATIVE  NEGATIVE Final   Comment: Microscopic not done on urines with negative protein, blood, leukocytes, nitrite, or glucose < 500 mg/dL. Performed at [facility], [address] [place], [place] [address]   ABO/RH(D) 12/05/2020 A POS   Final   Antibody Screen 12/05/2020 NEG   Final   Sample Expiration 12/05/2020 12/19/2020,2359   Final   Extend sample reason 12/05/2020    Final                   Value:NO TRANSFUSIONS OR PREGNANCY IN THE PAST 3 MONTHS Performed at [facility], [address] [place], [place] [address]    Lab Results  Component Value Date   HGBA1C 10.1 (H) 12/05/2020    ECG: Date: 12/11/2020 Time ECG obtained: 1538 PM Rate: 68 bpm Rhythm: normal sinus Axis (leads I and aVF): Left Intervals: PR 180 ms. QRS 90 ms. QTc 418 ms. ST segment and T wave changes: Nonspecific T wave abnormality Comparison: Similar to previous tracing obtained on 08/14/2020   IMAGING / PROCEDURES: MRI KNEE LEFT WO CONTRAST performed on 09/04/2020 Degenerative tear of the posterior horn of the medial meniscus near the meniscal root.  Mild resulting peripheral extrusion of the meniscus from the joint space Moderate medial and patellofemoral compartment degenerative changes. No acute osseous findings Small joint effusion and Baker's cyst Lateral  meniscus, cruciate, collateral ligaments are all intact  LEFT HEART CATHETERIZATION AND CORONARY ANGIOGRAPHY performed on 12/16/2019 Normal left ventricular systolic function Normal LVEDP Single-vessel CAD 99% stenosis of the proximal mid RCA 20% stenosis of the mid RCA Successful PCI 3.0 x 22 mm Resolute Onyx DES x1 to the proximal RCA resulting in 0% residual stenosis and TIMI-3 flow   MYOCARDIAL PERFUSION IMAGING STUDY (LEXISCAN) performed on 03/03/2019 Normal left ventricular systolic function with an EF of 60% Resting EKG with nonspecific ST and T wave abnormalities in the precordial and inferior leads CT attenuation correction imaging with mild aortic atherosclerosis and mid RCA coronary calcification Normal wall motion No evidence of significant stress-induced myocardial ischemia Low risk scan  Impression and Plan:  Curtis Singh has been referred for pre-anesthesia review and clearance prior to him undergoing the planned anesthetic and procedural courses. Available labs, pertinent testing, and imaging results were personally reviewed by me.  Patient with elevated glucose level noted on preoperative labs.  Repeat hemoglobin A1c was obtained and found to be elevated 10.1%, which increases patient's risk for postoperative SSI/PJI. The odds ratio for SSI/PJI infection is between 2.8 and 3.4 for orthopedic surgery patients with pre-operative serum glucose levels of > 125 mg/dL or a post-operative levels of > 200 mg/dL ([place], 2019).   This patient has been appropriately cleared by cardiology with an overall ACCEPTABLE risk of significant perioperative cardiovascular complications. Based on clinical review performed today (03/04/21), barring any significant acute changes in the patient's overall condition, it is anticipated that he will be able to proceed with the planned surgical intervention. Any acute changes in clinical condition may necessitate his procedure being postponed  and/or cancelled. Patient will meet with anesthesia team (MD and/or CRNA) on the day of his procedure for preoperative evaluation/assessment. Questions regarding anesthetic course will be fielded at that time.   Pre-surgical instructions were reviewed with the patient during his PAT appointment and questions were fielded by PAT clinical staff. Patient was advised that if any questions or concerns arise prior to his procedure then he should return a call to PAT and/or his surgeon's office to discuss.  Citation: Charlett Blake, A.F. Reducing the risk of infection after total joint arthroplasty: preoperative optimization. Arthroplasty 1, 4 (2019). [email or web address]  Honor Loh, MSN, APRN, FNP-C, CEN [facility]  Peri-operative Services Nurse Practitioner Phone: [phone] Fax: [phone] 03/04/21 2:25 PM  NOTE: This note has been prepared using Dragon dictation software. Despite my best ability to proofread, there is always the potential that unintentional transcriptional errors may still occur from this process.

## 2021-03-04 NOTE — Telephone Encounter (Signed)
° °  Primary Cardiologist: [name], MD  Chart reviewed as part of pre-operative protocol coverage. Given past medical history and time since last visit, based on ACC/AHA guidelines, TAHMIR KLECKNER would be at acceptable risk for the planned procedure without further cardiovascular testing.   Per the patient he was started on $RemoveBe'81mg'[id number]$  ASA BID by Dr. Roland Rack and he does not need to hold this medication for his left knee manipulation/steroid injection.   He has met the 4 METs minimum per our conversation this morning. No new cardiac complaints at this time.   Patient was advised that if he develops new symptoms prior to surgery to contact our office to arrange a follow-up appointment.  He verbalized understanding.  I will route this recommendation to the requesting party via Epic fax function and remove from pre-op pool.  Please call with questions.  Elgie Collard, PA-C  03/04/2021, 8:45 AM

## 2021-03-05 NOTE — Anesthesia Preprocedure Evaluation (Addendum)
Anesthesia Evaluation  Patient identified by MRN, date of birth, ID band Patient awake    Reviewed: Allergy & Precautions, NPO status , Patient's Chart, lab work & pertinent test results, reviewed documented beta blocker date and time   History of Anesthesia Complications Negative for: history of anesthetic complications  Airway Mallampati: III  TM Distance: >3 FB Neck ROM: Full    Dental no notable dental hx. (+) Teeth Intact   Pulmonary neg pulmonary ROS, neg sleep apnea, neg COPD, Patient abstained from smoking.Not current smoker, former smoker,    Pulmonary exam normal breath sounds clear to auscultation       Cardiovascular Exercise Tolerance: Good METShypertension, Pt. on home beta blockers + CAD and + Cardiac Stents  (-) Past MI (-) dysrhythmias  Rhythm:Regular Rate:Normal - Systolic murmurs Stents placed 1 year ago. Brillinta    Neuro/Psych negative neurological ROS  negative psych ROS   GI/Hepatic GERD  Controlled and Medicated,(+)     (-) substance abuse  ,   Endo/Other  diabetes, Poorly Controlled, Oral Hypoglycemic Agents  Renal/GU negative Renal ROS     Musculoskeletal  (+) Arthritis ,   Abdominal   Peds  Hematology   Anesthesia Other Findings Past Medical History: No date: Aortic atherosclerosis ([place]) 12/2019: Coronary artery disease     Comment:  a.) LHC 12/16/2019: EF 55-65%, LVEDP norm. 99% p-mRCA,               20% mRCA. --> PCI placing a 3.0 x 22 mm Resolute Onyx DES              to pRCA. 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 No date: HLD (hyperlipidemia) No date: Hypertension No date: Long term current use of antithrombotics/antiplatelets     Comment:  a.) DAPT (ASA + ticagrelor) No date: Osteoarthritis No date: Seasonal allergies No date: T2DM (type 2 diabetes mellitus) ([place])   Reproductive/Obstetrics                            Anesthesia Physical  Anesthesia Plan  ASA: 3  Anesthesia Plan: General   Post-op Pain Management:    Induction: Intravenous  PONV Risk Score and Plan: 2 and TIVA and Treatment may vary due to age or medical condition  Airway Management Planned: Mask and Natural Airway  Additional Equipment: None  Intra-op Plan:   Post-operative Plan:   Informed Consent: I have reviewed the patients History and Physical, chart, labs and discussed the procedure including the risks, benefits and alternatives for the proposed anesthesia with the patient or authorized representative who has indicated his/her understanding and acceptance.     Dental advisory given  Plan Discussed with: CRNA and Surgeon  Anesthesia Plan Comments:       Anesthesia Quick Evaluation

## 2021-03-06 ENCOUNTER — Other Ambulatory Visit: Payer: Self-pay

## 2021-03-06 ENCOUNTER — Encounter: Payer: No Typology Code available for payment source | Admitting: Physical Therapy

## 2021-03-06 ENCOUNTER — Ambulatory Visit: Payer: No Typology Code available for payment source | Admitting: Urgent Care

## 2021-03-06 ENCOUNTER — Encounter
Admission: RE | Disposition: A | Discharge status: Home / Self Care | Payer: Self-pay | Source: Home / Self Care | Attending: Surgery

## 2021-03-06 ENCOUNTER — Ambulatory Visit
Admission: RE | Admit: 2021-03-06 | Discharge: 2021-03-06 | Disposition: A | Discharge status: Home / Self Care | Payer: No Typology Code available for payment source | Attending: Surgery | Admitting: Surgery

## 2021-03-06 ENCOUNTER — Encounter: Payer: Self-pay | Admitting: Surgery

## 2021-03-06 DIAGNOSIS — Z87891 Personal history of nicotine dependence: Secondary | ICD-10-CM | POA: Insufficient documentation

## 2021-03-06 DIAGNOSIS — E119 Type 2 diabetes mellitus without complications: Secondary | ICD-10-CM | POA: Diagnosis not present

## 2021-03-06 DIAGNOSIS — I10 Essential (primary) hypertension: Secondary | ICD-10-CM | POA: Insufficient documentation

## 2021-03-06 DIAGNOSIS — I2510 Atherosclerotic heart disease of native coronary artery without angina pectoris: Secondary | ICD-10-CM | POA: Insufficient documentation

## 2021-03-06 DIAGNOSIS — M24662 Ankylosis, left knee: Secondary | ICD-10-CM | POA: Diagnosis present

## 2021-03-06 DIAGNOSIS — Z7902 Long term (current) use of antithrombotics/antiplatelets: Secondary | ICD-10-CM | POA: Insufficient documentation

## 2021-03-06 DIAGNOSIS — M1990 Unspecified osteoarthritis, unspecified site: Secondary | ICD-10-CM | POA: Insufficient documentation

## 2021-03-06 DIAGNOSIS — Z7984 Long term (current) use of oral hypoglycemic drugs: Secondary | ICD-10-CM | POA: Diagnosis not present

## 2021-03-06 DIAGNOSIS — Z955 Presence of coronary angioplasty implant and graft: Secondary | ICD-10-CM | POA: Diagnosis not present

## 2021-03-06 DIAGNOSIS — E785 Hyperlipidemia, unspecified: Secondary | ICD-10-CM | POA: Diagnosis not present

## 2021-03-06 DIAGNOSIS — K219 Gastro-esophageal reflux disease without esophagitis: Secondary | ICD-10-CM | POA: Insufficient documentation

## 2021-03-06 HISTORY — PX: KNEE CLOSED REDUCTION: SHX995

## 2021-03-06 LAB — GLUCOSE, CAPILLARY
Glucose-Capillary: 204 mg/dL — ABNORMAL HIGH (ref 70–99)
Glucose-Capillary: 265 mg/dL — ABNORMAL HIGH (ref 70–99)

## 2021-03-06 SURGERY — MANIPULATION, KNEE, CLOSED
Anesthesia: General | Site: Knee | Laterality: Left

## 2021-03-06 MED ORDER — SODIUM CHLORIDE 0.9 % IV SOLN
INTRAVENOUS | Status: DC
Start: 2021-03-06 — End: 2021-03-06

## 2021-03-06 MED ORDER — ORAL CARE MOUTH RINSE: 15.0000 mL | Freq: Once | OROMUCOSAL | Status: AC

## 2021-03-06 MED ORDER — ZOLPIDEM TARTRATE 10 MG PO TABS: 10.0000 mg | ORAL_TABLET | Freq: Every day | ORAL | Status: DC

## 2021-03-06 MED ORDER — ONDANSETRON HCL 4 MG/2ML IJ SOLN
INTRAMUSCULAR | Status: DC | PRN
  Administered 2021-03-06: 4 mg via INTRAVENOUS

## 2021-03-06 MED ORDER — PROPOFOL 500 MG/50ML IV EMUL
INTRAVENOUS | Status: DC | PRN
  Administered 2021-03-06: 145 ug/kg/min via INTRAVENOUS

## 2021-03-06 MED ORDER — CHLORHEXIDINE GLUCONATE 0.12 % MT SOLN: 15.0000 mL | Freq: Once | OROMUCOSAL | Status: AC

## 2021-03-06 MED ORDER — FENTANYL CITRATE (PF) 100 MCG/2ML IJ SOLN
INTRAMUSCULAR | Status: AC
  Filled 2021-03-06: qty 2

## 2021-03-06 MED ORDER — HYDROCODONE-ACETAMINOPHEN 5-325 MG PO TABS: 1.0000 | ORAL_TABLET | Freq: Four times a day (QID) | ORAL | 0 refills | Status: DC | PRN

## 2021-03-06 MED ORDER — FENTANYL CITRATE (PF) 100 MCG/2ML IJ SOLN
25.0000 ug | INTRAMUSCULAR | Status: DC | PRN
  Administered 2021-03-06 (×4): 25 ug via INTRAVENOUS

## 2021-03-06 MED ORDER — PROPOFOL 1000 MG/100ML IV EMUL
INTRAVENOUS | Status: AC
  Filled 2021-03-06: qty 100

## 2021-03-06 MED ORDER — HYDROCODONE-ACETAMINOPHEN 5-325 MG PO TABS
ORAL_TABLET | ORAL | 0 refills | Status: DC
  Filled 2021-03-06: qty 40, 5d supply, fill #0

## 2021-03-06 MED ORDER — LOSARTAN POTASSIUM 25 MG PO TABS: 25.0000 mg | ORAL_TABLET | Freq: Every day | ORAL | Status: AC

## 2021-03-06 MED ORDER — PHENYLEPHRINE HCL-NACL 20-0.9 MG/250ML-% IV SOLN
INTRAVENOUS | Status: AC
  Filled 2021-03-06: qty 250

## 2021-03-06 MED ORDER — KETOROLAC TROMETHAMINE 30 MG/ML IJ SOLN
30.0000 mg | Freq: Once | INTRAMUSCULAR | Status: AC
  Administered 2021-03-06: 30 mg via INTRAVENOUS

## 2021-03-06 MED ORDER — KETOROLAC TROMETHAMINE 30 MG/ML IJ SOLN
INTRAMUSCULAR | Status: AC
  Filled 2021-03-06: qty 1

## 2021-03-06 MED ORDER — MIDAZOLAM HCL 2 MG/2ML IJ SOLN
INTRAMUSCULAR | Status: AC
  Filled 2021-03-06: qty 2

## 2021-03-06 MED ORDER — GLYCOPYRROLATE 0.2 MG/ML IJ SOLN
INTRAMUSCULAR | Status: DC | PRN
  Administered 2021-03-06: .2 mg via INTRAVENOUS

## 2021-03-06 MED ORDER — OXYCODONE HCL 5 MG/5ML PO SOLN: 5.0000 mg | Freq: Once | ORAL | Status: AC | PRN

## 2021-03-06 MED ORDER — FENTANYL CITRATE (PF) 100 MCG/2ML IJ SOLN
INTRAMUSCULAR | Status: DC | PRN
  Administered 2021-03-06: 50 ug via INTRAVENOUS

## 2021-03-06 MED ORDER — ACETAMINOPHEN 500 MG PO TABS: 1000.0000 mg | ORAL_TABLET | Freq: Once | ORAL | Status: DC | PRN

## 2021-03-06 MED ORDER — PROPOFOL 10 MG/ML IV BOLUS
INTRAVENOUS | Status: DC | PRN
  Administered 2021-03-06 (×3): 50 mg via INTRAVENOUS

## 2021-03-06 MED ORDER — OXYCODONE HCL 5 MG PO TABS
5.0000 mg | ORAL_TABLET | Freq: Once | ORAL | Status: AC | PRN
  Administered 2021-03-06: 5 mg via ORAL

## 2021-03-06 MED ORDER — TRIAMCINOLONE ACETONIDE 40 MG/ML IJ SUSP
INTRAMUSCULAR | Status: DC | PRN
  Administered 2021-03-06: 10 mL via INTRAMUSCULAR

## 2021-03-06 MED ORDER — MIDAZOLAM HCL 2 MG/2ML IJ SOLN
INTRAMUSCULAR | Status: DC | PRN
Start: 2021-03-06 — End: 2021-03-06
  Administered 2021-03-06: 2 mg via INTRAVENOUS

## 2021-03-06 MED ORDER — DEXAMETHASONE SODIUM PHOSPHATE 10 MG/ML IJ SOLN
INTRAMUSCULAR | Status: DC | PRN
  Administered 2021-03-06: 10 mg via INTRAVENOUS

## 2021-03-06 MED ORDER — OXYCODONE HCL 5 MG PO TABS
ORAL_TABLET | ORAL | Status: AC
  Filled 2021-03-06: qty 1

## 2021-03-06 MED ORDER — CHLORHEXIDINE GLUCONATE 0.12 % MT SOLN
OROMUCOSAL | Status: AC
  Administered 2021-03-06: 15 mL via OROMUCOSAL
  Filled 2021-03-06: qty 15

## 2021-03-06 SURGICAL SUPPLY — 3 items
KIT TURNOVER KIT A (KITS) ×2 IMPLANT
NEEDLE HYPO 22GX1.5 SAFETY (NEEDLE) ×1 IMPLANT
SYR 10ML LL (SYRINGE) ×1 IMPLANT

## 2021-03-06 NOTE — Discharge Instructions (Addendum)
Orthopedic discharge instructions: ?May shower tonight. ?Apply ice frequently to knee or use polar care device. ?Take ibuprofen 600-800 mg TID with meals for 7-10 days, then as necessary. ?Take pain medication as prescribed or ES Tylenol when needed.  ?May weight-bear as tolerated - use crutches or walker as needed. ?Start PT tomorrow as scheduled. ?Follow-up in 10-14 days or as scheduled. ? ?AMBULATORY SURGERY  ?DISCHARGE INSTRUCTIONS ? ? ?The drugs that you were given will stay in your system until tomorrow so for the next 24 hours you should not: ? ?Drive an automobile ?Make any legal decisions ?Drink any alcoholic beverage ? ? ?You may resume regular meals tomorrow.  Today it is better to start with liquids and gradually work up to solid foods. ? ?You may eat anything you prefer, but it is better to start with liquids, then soup and crackers, and gradually work up to solid foods. ? ? ?Please notify your doctor immediately if you have any unusual bleeding, trouble breathing, redness and pain at the surgery site, drainage, fever, or pain not relieved by medication. ? ? ? ?Additional Instructions: ? ? ?AMBULATORY SURGERY  ?DISCHARGE INSTRUCTIONS ? ? ?The drugs that you were given will stay in your system until tomorrow so for the next 24 hours you should not: ? ?Drive an automobile ?Make any legal decisions ?Drink any alcoholic beverage ? ? ?You may resume regular meals tomorrow.  Today it is better to start with liquids and gradually work up to solid foods. ? ?You may eat anything you prefer, but it is better to start with liquids, then soup and crackers, and gradually work up to solid foods. ? ? ?Please notify your doctor immediately if you have any unusual bleeding, trouble breathing, redness and pain at the surgery site, drainage, fever, or pain not relieved by medication. ? ? ? ?Additional Instructions: ? ? ? ?Please contact your physician with any problems or Same Day Surgery at [phone], Monday through  Friday 6 am to 4 pm, or [place] at [facility] number at [phone]. Please contact your physician with any problems or Same Day Surgery at [phone], Monday through Friday 6 am to 4 pm, or [place] at [facility] number at [phone].  ?

## 2021-03-06 NOTE — Anesthesia Procedure Notes (Signed)
Procedure Name: General with mask airway ?Date/Time: 03/06/2021 12:56 PM ?Performed by: Kelton Pillar, CRNA ?Pre-anesthesia Checklist: Patient identified, Emergency Drugs available, Suction available and Patient being monitored ?Patient Re-evaluated:Patient Re-evaluated prior to induction ?Oxygen Delivery Method: Simple face mask ?Induction Type: IV induction ?Placement Confirmation: positive ETCO2 and CO2 detector ?Dental Injury: Teeth and Oropharynx as per pre-operative assessment  ? ? ? ? ?

## 2021-03-06 NOTE — H&P (Signed)
History of Present Illness:  ?Curtis Singh is a 66 y.o. male who presents for follow-up now 10 weeks status post a left total knee arthroplasty. The patient was last seen for the symptoms 1 month ago. At this visit, his motion was noted to be unacceptably limited, so he was advised to pursue physical therapy more aggressively to try to improve his range of motion. The patient notes that he has been attending physical therapy quite regularly since that visit but continues to have difficulty regaining his range of motion. He is ambulating without any assistive devices, but is unable to reciprocate stairs due to his limited range of motion. He still has some discomfort at night as well. He rates his pain at 4/10 on today's visit, and is taking only Tylenol as necessary. He denies any reinjury to the knee, and denies any fevers or chills. He has not yet returned to work, but feels that he should try to return in some capacity. ? ?Current Outpatient Medications: ? aspirin 81 MG EC tablet Take 81 mg by mouth once daily  ? blood glucose diagnostic (FREESTYLE LITE STRIPS) test strip  ? blood glucose meter kit  ? dexlansoprazole (DEXILANT) 60 mg DR capsule Take by mouth  ? hydroCHLOROthiazide (MICROZIDE) 12.5 mg capsule  ? ibuprofen (MOTRIN) 200 MG tablet Take by mouth  ? insulin DETEMIR (LEVEMIR) injection (concentration 100 units/mL) Inject 100 Units subcutaneously nightly Inject 12 units daily as directed  ? insulin GLARGINE (LANTUS) injection (concentration 100 units/mL) Inject subcutaneously  ? LORazepam (ATIVAN) 1 MG tablet Take 1 tablet 4 hours prior to scheduled MRI procedure. May repeat 1/2-hour prior to procedure. 2 tablet 0  ? losartan (COZAAR) 25 MG tablet Take 1 tablet by mouth once daily  ? metFORMIN (GLUCOPHAGE) 500 MG tablet Take 500 mg by mouth 2 (two) times daily with meals  ? metoprolol succinate (TOPROL-XL) 50 MG XL tablet Take by mouth  ? multivitamin capsule Take 1 capsule by mouth once daily  ?  multivitamin with minerals tablet Take by mouth  ? neomycin-polymyxin-dexAMETHasone (MAXITROL) ophthalmic suspension Place one drop 4 times per day in the surgical eye for 2 weeks then stop 5 mL 0  ? omeprazole (PRILOSEC) 20 MG DR capsule  ? pen needle, diabetic 29 gauge x 1/2" needle  ? rosuvastatin (CRESTOR) 40 MG tablet Take by mouth  ? semaglutide (OZEMPIC) 0.25 mg or 0.5 mg(2 mg/1.5 mL) pen injector Inject subcutaneously  ? sildenafiL (VIAGRA) 25 MG tablet Take by mouth at bedtime as needed  ? ticagrelor (BRILINTA) 60 mg tablet Take by mouth  ? zolpidem (AMBIEN) 10 mg tablet Take by mouth  ? zolpidem (AMBIEN) 10 mg tablet Take 10 mg by mouth at bedtime as needed  ? ?Allergies:  ? Latex, Natural Rubber Rash  ? ?Past Medical History:  ? Diabetes mellitus without complication (CMS-HCC)  ? GERD (gastroesophageal reflux disease)  ? Hematologic abnormality Had stent put in Dec. 21  ? Hyperlipidemia  ? Hypertension  ? Primary osteoarthritis of left knee 06/06/2020  ? ?Past Surgical History:  ? Left TKA using all-cemented [employer] Vanguard system with a 67.5 mm mm PCR femur, a 75 mm tibial tray with a 12 mm anterior stabilized E-poly insert, and a 34 x 7.8 mm all-poly 3-pegged domed patella Left 12/13/2020 (Dr.Matthew Cina)  ? VASECTOMY  ? ?Past Family History: ?No family history on file.  ? ?Social History:  ? ?Socioeconomic History:  ? Marital status: Single  ?Tobacco Use  ? Smoking status:  Former  ?Packs/day: 0.00  ?Years: 10.00  ?Pack years: 0.00  ?Types: Cigarettes  ? Smokeless tobacco: Never  ?Substance and Sexual Activity  ? Alcohol use: Yes  ?Alcohol/week: 18.0 standard drinks  ?Types: 18 Cans of beer per week  ? Drug use: Never  ? Sexual activity: Yes  ?Partners: Female  ? ?Review of Systems:  ?A comprehensive 14 point ROS was performed, reviewed, and the pertinent orthopaedic findings are documented in the HPI. ? ?Physical Exam: ?Vitals:  ?02/27/21 1127  ?BP: (!) 144/78  ?Weight: 88.2 kg (194 lb 6.4 oz)  ?Height: 177.8  cm ('5\' 10"' )  ?PainSc: 4  ?PainLoc: Knee  ? ?General/Constitutional: The patient appears to be well-nourished, well-developed, and in no acute distress. ?Neuro/Psych: Normal mood and affect, oriented to person, place and time.  ?Eyes: Non-icteric. Pupils are equal, round, and reactive to light, and exhibit synchronous movement. ?ENT: Unremarkable. ?Lymphatic: No palpable adenopathy. ?Respiratory: Lungs clear to auscultation, Normal chest excursion, No wheezes and Non-labored breathing ?Cardiovascular: Regular rate and rhythm. No murmurs. and No edema, swelling or tenderness, except as noted in detailed exam. ?Integumentary: No impressive skin lesions present, except as noted in detailed exam. ?Musculoskeletal: Unremarkable, except as noted in detailed exam. ? ?Left knee exam: ?The patient demonstrates a mild limp, favoring his left leg, is no longer using any assistive devices. On examination, his surgical incision is well-healed and without evidence for infection. There is mild-moderate swelling diffusely around the knee, but no erythema, ecchymosis, abrasions, or other skin abnormalities identified. Exhibits a trace effusion. He has no tenderness to palpation over the medial or lateral aspects of the knee. Actively, he lacks 10 degrees of extension but is able to flex to 85 degrees. Passively, the knee can be extended to 8 degrees and flexed to 90 degrees. His patella tracks well and is without crepitance. The knee is stable to varus and valgus stressing. He remains neurovascularly intact to the left lower extremity and foot. ? ?Assessment: ? Arthrofibrosis of knee joint, left  ? Status post total knee replacement using cement, left  ? Primary osteoarthritis of left knee  ? ?Plan: ?The treatment options were discussed with the patient. In addition, patient educational materials were provided regarding the diagnosis and treatment options. The patient is quite frustrated by his persistently limited range of motion  and overall function. Therefore, I have recommended a surgical procedure, specifically a manipulation under anesthesia with steroid injection for his left knee. The procedure was discussed with the patient, as were the potential risks (including bleeding, infection, nerve and/or blood vessel injury, persistent or recurrent pain, residual stiffness, leg fracture, need for further surgery, blood clots, strokes, heart attacks and/or arhythmias, pneumonia, etc.) and benefits. The patient states his understanding and wishes to proceed. All of the patient's questions and concerns were answered. He can call any time with further concerns. He will follow up post-surgery, routine. ? ? ?H&P reviewed and patient re-examined. No changes. ? ?

## 2021-03-06 NOTE — Transfer of Care (Signed)
Immediate Anesthesia Transfer of Care Note ? ?Patient: Curtis Singh ? ?Procedure(s) Performed: Left knee manipulation and steroid injection (Left: Knee) ? ?Patient Location: PACU ? ?Anesthesia Type:General ? ?Level of Consciousness: drowsy and patient cooperative ? ?Airway & Oxygen Therapy: Patient Spontanous Breathing and Patient connected to face mask oxygen ? ?Post-op Assessment: Report given to RN and Post -op Vital signs reviewed and stable ? ?Post vital signs: Reviewed and stable ? ?Last Vitals:  ?Vitals Value Taken Time  ?BP 124/80 03/06/21 1318  ?Temp 36.5 ?C 03/06/21 1318  ?Pulse 83 03/06/21 1322  ?Resp 19 03/06/21 1322  ?SpO2 99 % 03/06/21 1322  ?Vitals shown include unvalidated device data. ? ?Last Pain:  ?Vitals:  ? 03/06/21 1318  ?TempSrc:   ?PainSc: Asleep  ?   ? ?  ? ?Complications: No notable events documented. ?

## 2021-03-06 NOTE — Op Note (Signed)
03/06/2021 ? ?1:15 PM ? ?Patient:   Curtis Singh ? ?Pre-Op Diagnosis:   Arthrofibrosis of left knee status post left TKA ? ?Post-Op Diagnosis:   Same ? ?Procedure:   Manipulation under anesthesia with steroid injection, left knee ? ?Surgeon:   Pascal Lux, MD ? ?Assistant:   None ? ?Anesthesia:   IV sedation ? ?Findings:   As above.  Prior to manipulation, the knee could be ranged from 15 to 85 degrees.  Following manipulation, the knee could be ranged from 5 to 140 degrees. ? ?Complications:   None ? ?Fluids:   200 cc crystalloid ? ?EBL:   0 cc ? ?UOP:   None ? ?TT:   None ? ?Drains:   None ? ?Closure:   None ? ?Brief Clinical Note:   The patient is a 66 year old male who is now 12 weeks status post a left total knee arthroplasty.  Despite extensive physical therapy, the patient continues to have difficulty regaining his knee range of motion.  He presents at this time for a manipulation under anesthesia with steroid injection of the left knee. ? ?Procedure:   The patient was brought into the operating room and lain in the supine position.  After adequate IV sedation was achieved, a timeout was performed to verify the appropriate surgical site.  The knee was gently manipulated.  Several palpable and audible pops were heard as the scar tissue released, permitting the knee range of motion to be improved as described above.  The left knee was injected sterilely using 1 cc of Kenalog-40 and 9 cc of 0.25% Sensorcaine with epinephrine before the patient was awakened and returned to the recovery room in satisfactory condition after tolerating the procedure well. ?

## 2021-03-06 NOTE — Anesthesia Postprocedure Evaluation (Signed)
Anesthesia Post Note ? ?Patient: Curtis Singh ? ?Procedure(s) Performed: Left knee manipulation and steroid injection (Left: Knee) ? ?Patient location during evaluation: PACU ?Anesthesia Type: General ?Level of consciousness: awake and alert ?Pain management: pain level controlled ?Vital Signs Assessment: post-procedure vital signs reviewed and stable ?Respiratory status: spontaneous breathing, nonlabored ventilation and respiratory function stable ?Cardiovascular status: blood pressure returned to baseline and stable ?Postop Assessment: no apparent nausea or vomiting ?Anesthetic complications: no ? ? ?No notable events documented. ? ? ?Last Vitals:  ?Vitals:  ? 03/06/21 1401 03/06/21 1436  ?BP: (!) 141/81 (!) 142/81  ?Pulse: 70 63  ?Resp: 14 16  ?Temp:  (!) 36.3 ?C  ?SpO2: 98% 99%  ?  ?Last Pain:  ?Vitals:  ? 03/06/21 1436  ?TempSrc: Temporal  ?PainSc: 3   ? ? ?  ?  ?  ?  ?  ?  ? ?[place] Ouch ? ? ? ? ?

## 2021-03-07 ENCOUNTER — Other Ambulatory Visit: Payer: Self-pay

## 2021-03-07 ENCOUNTER — Encounter: Payer: Self-pay | Admitting: Surgery

## 2021-03-07 ENCOUNTER — Ambulatory Visit: Payer: No Typology Code available for payment source | Attending: Surgery | Admitting: Physical Therapy

## 2021-03-07 DIAGNOSIS — M25562 Pain in left knee: Secondary | ICD-10-CM | POA: Insufficient documentation

## 2021-03-07 DIAGNOSIS — R269 Unspecified abnormalities of gait and mobility: Secondary | ICD-10-CM | POA: Diagnosis present

## 2021-03-07 DIAGNOSIS — Z96652 Presence of left artificial knee joint: Secondary | ICD-10-CM | POA: Insufficient documentation

## 2021-03-07 DIAGNOSIS — M6281 Muscle weakness (generalized): Secondary | ICD-10-CM | POA: Insufficient documentation

## 2021-03-07 DIAGNOSIS — M25662 Stiffness of left knee, not elsewhere classified: Secondary | ICD-10-CM | POA: Insufficient documentation

## 2021-03-07 NOTE — Patient Instructions (Signed)
Access Code: [email or web address] ?URL: [email or web address] ?Date: 03/07/2021 ?Prepared by: Dorcas Carrow ? ?Exercises ?Prone Knee Extension Overpressure - 1 x daily - 7 x weekly - 1 sets - 10 reps ?Prone Quadricep Stretch with Strap - 1 x daily - 7 x weekly - 1 sets - 10 reps ?Standing Gastroc Stretch - 1 x daily - 7 x weekly - 3 sets - 3 reps - 20 hold ?Standing Terminal Knee Extension with Resistance - 1 x daily - 7 x weekly - 3 sets - 10 reps ?

## 2021-03-07 NOTE — Therapy (Addendum)
[facility] [address] [place], [place], [address] Phone: [phone]   Fax:  [phone]  Physical Therapy Treatment Physical Therapy Progress Note   Dates of reporting period  01/21/21 to 03/07/21  Patient Details  Name: Curtis Singh MRN: [id number] Date of Birth: Dec 30, 1955 Referring Provider (PT): Dr. Roland Rack   Encounter Date: 03/07/2021   PT End of Session - 03/07/21 1023     Visit Number 20    Number of Visits 25    Date for PT Re-Evaluation 03/13/21    Authorization - Visit Number 1    Authorization - Number of Visits 10    PT Start Time 7793    PT Stop Time 9030    PT Time Calculation (min) 49 min    Activity Tolerance Patient limited by pain;Patient tolerated treatment well    Behavior During Therapy [facility] for tasks assessed/performed             Past Medical History:  Diagnosis Date   Aortic atherosclerosis ([place])    Coronary artery disease 12/2019   a.) LHC 12/16/2019: EF 55-65%, LVEDP norm. 99% p-mRCA, 20% mRCA. --> PCI placing a 3.0 x 22 mm Resolute Onyx DES to pRCA.   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    HLD (hyperlipidemia)    Hypertension    Long term current use of antithrombotics/antiplatelets    a.) DAPT (ASA + ticagrelor)   Osteoarthritis    Seasonal allergies    T2DM (type 2 diabetes mellitus) ([place])     Past Surgical History:  Procedure Laterality Date   ARTHOSCOPIC ROTAOR CUFF REPAIR Left 02/25/2018   Procedure: LEFT ARTHROSCOPIC ROTATOR CUFF REPAIR; SUBACROMIAL DECOMPRESSION;  Surgeon: Tania Ade, MD;  Location: [facility];  Service: Orthopedics;  Laterality: Left;   CATARACT EXTRACTION W/PHACO Right 09/03/2016   Procedure: CATARACT EXTRACTION PHACO AND INTRAOCULAR LENS PLACEMENT ([place]) RIGHT DIABETIC;  Surgeon: Leandrew Koyanagi, MD;  Location: [place];  Service: Ophthalmology;  Laterality:  Right;  DIABETIC   COLONOSCOPY WITH PROPOFOL N/A 08/29/2019   Procedure: COLONOSCOPY WITH PROPOFOL;  Surgeon: Lin Landsman, MD;  Location: [facility] ENDOSCOPY;  Service: Gastroenterology;  Laterality: N/A;   CORONARY STENT INTERVENTION N/A 12/16/2019   Procedure: CORONARY STENT INTERVENTION (3.0 x 22 mm Resolute Onyx DES to pRCA);  Surgeon: Wellington Hampshire, MD;  Location: [facility];  Service: Cardiovascular;  Laterality: N/A;   KNEE CLOSED REDUCTION Left 03/06/2021   Procedure: Left knee manipulation and steroid injection;  Surgeon: Corky Mull, MD;  Location: [facility];  Service: Orthopedics;  Laterality: Left;   LEFT HEART CATH AND CORONARY ANGIOGRAPHY N/A 12/16/2019   Procedure: LEFT HEART CATH AND CORONARY ANGIOGRAPHY;  Surgeon: Wellington Hampshire, MD;  Location: [place];  Service: Cardiovascular;  Laterality: N/A;   ROTATOR CUFF REPAIR Right 01/07/2007   TOTAL KNEE ARTHROPLASTY Left 12/13/2020   Procedure: TOTAL KNEE ARTHROPLASTY;  Surgeon: Corky Mull, MD;  Location: [facility];  Service: Orthopedics;  Laterality: Left;   VASECTOMY N/A     There were no vitals filed for this visit.   Subjective Assessment - 03/07/21 1025     Subjective Pt. arrives to clinic using SPC and no L knee pain at rest. Pt. states pain has moved from center of L knee joint up to distal quadriceps. Pt. feels he has more motion post L  knee manipulation that he received yesterday (3/1).    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 Pt. 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  Pt. known well to skilled PT services.  Pt. has good caregiver assist for daily household tasks/ driving.    Limitations Standing;Walking;House hold activities    Patient Stated Goals Increase L knee AROM/ strength to improve pain-free mobility and return to work.    Currently in Pain? No/denies    Pain Score 0-No pain    Pain Location Knee    Pain Orientation Left             Therex.:   Nustep L4, seat 9-7 for 10 min. B UE/LE     Seated contract relax 2x5 knee flexion.   Prone knee flexion contract relax 3x. Pt. Has severe pain past 90 deg.     Manual tx.:     Supine L knee AA/PROM flexion 10x each.  (L knee -8 to 90 degs)   Prone AAROM L knee flexion with strap. 5x   Prone L knee extension with towel under knee. Pt. Able to passively increase L knee extension.    PT Education - 03/07/21 1221     Education Details [email or web address]    Person(s) Educated Patient    Methods Explanation;Demonstration;Handout    Comprehension Verbalized understanding              PT Short Term Goals - 01/16/21 1248       PT SHORT TERM GOAL #1   Baseline --               PT Long Term Goals - 03/07/21 1302       PT LONG TERM GOAL #1   Title Pt. will increase FOTO to 45 to improve pain-free moblity with daily tasks.    Baseline FOTO: initial: 12.  1/11: 56.  2/20: 69    Time 8    Period Weeks    Status Achieved    Target Date 02/25/21      PT LONG TERM GOAL #2   Title Pt. will increase L knee AROM to 0->115 deg. to improve walking/ stairs/ return to work.    Baseline R knee AROM: -1 to 128 deg. and strength grossly 5/5 MMT. Pt. pain limited with L knee A/PROM: extensino -12/-10 deg., flexion 64/72 deg. (guarded/ pain limted).  1/11: supine L knee (-11 to 86 deg.),  seated L knee flexion 88 deg.  2/20: -11 to 92 deg. L knee AROM.  3/2: -8 to 90 deg.    Time 8    Period Weeks    Status Partially Met    Target Date 03/13/21      PT LONG TERM GOAL #3   Title Pt. will stand from chair with proper technique/ no UE assist to improve pain-free mobility.    Baseline 9/10 L knee pain with sit to stands    Time 8    Period Weeks    Status Partially Met    Target Date 03/13/21      PT LONG TERM GOAL #4   Title Pt. will ambulate community distances with normalized gait pattern/ no assistive device to improve return to work.    Baseline Pt. ambulates  with limited L LE wt. bearing and heavy UE assist on RW. Limited R LE step length during L LE weight bearing due to pain.    Time 8    Period  Weeks    Status Partially Met    Target Date 03/13/21      PT LONG TERM GOAL #5   Title Pt. will ascend/ descend stairs with recip. pattern and no handrail to improve functional mobility.    Baseline Heavy UE assist with recip. gait pattern    Time 8    Period Weeks    Status Partially Met    Target Date 03/13/21      PT LONG TERM GOAL #6   Title Pt. will be able to return to work and perform functional activities with <2 increase in NPS.    Baseline Pain increases to >6 when performing funcational activites. resting NPS 3/10.   2/20: 4/10 L knee pain reported    Time 4    Period Weeks    Status Partially Met    Target Date 03/13/21                   Plan - 03/07/21 1222     Clinical Impression Statement Pt. returns to the clinic s/p L knee manipulation; he states he is happy with the procedure and feels his AROM has increased. Pts. L knee AROM is -8 to 90 degs; L knee flexion is potentially limited due to the swelling in distal L quad. Pt. is motivated to aggressively push AROM and return to functional ADLs. Pt. has follow up MD appointment 04/15/21 and returns to work 03/11/21. Pt. was educated on updated HEP to increase his independence and PT will continue to work on manual techniques to push AROM. Pt. understands the importance of keeping up with HEP over the next few weeks and will work on ROM as well as normalizing his gait pattern.    Stability/Clinical Decision Making Evolving/Moderate complexity    Clinical Decision Making Moderate    Rehab Potential Good    PT Frequency 2x / week    PT Duration 8 weeks    PT Treatment/Interventions ADLs/Self Care Home Management;Cryotherapy;Electrical Stimulation;Functional mobility training;Therapeutic activities;Therapeutic exercise;Patient/family education;Neuromuscular re-education;Manual  techniques;Scar mobilization;Passive range of motion;Stair training;Gait training;DME Instruction;Balance training    PT Next Visit Plan Progress L knee ROM.    PT Home Exercise Plan Access Code: [email or web address]    Consulted and Agree with Plan of Care Patient             Patient will benefit from skilled therapeutic intervention in order to improve the following deficits and impairments:  Decreased mobility, Decreased strength, Impaired flexibility, Hypomobility, Pain, Abnormal gait, Decreased balance, Difficulty walking, Decreased range of motion, Decreased scar mobility, Decreased activity tolerance  Visit Diagnosis: S/P total knee replacement, left  Joint stiffness of knee, left  Gait difficulty     Problem List Patient Active Problem List   Diagnosis Date Noted   Complex tear of medial meniscus of left knee as current injury 08/22/2020   Primary osteoarthritis of left knee 06/06/2020   Left knee pain 03/29/2020   Primary insomnia 03/29/2020   Encounter for medication titration 02/09/2020   Unstable angina (HCC)    Chest discomfort 12/12/2019   History of colonic polyps    Annual physical exam 07/01/2019   ED (erectile dysfunction) of organic origin 02/21/2015   Diabetes mellitus type 2 with complications, uncontrolled 10/18/2014   Hyperlipemia 10/18/2014   Pura Spice, PT, DPT # [place], SPT 03/07/2021, 1:15 PM  [facility] [address] [place], [place], [address] Phone: [phone]   Fax:  [phone]  Name: MAYO FAULK MRN: [id number] Date of Birth: 1955/10/12

## 2021-03-11 NOTE — Progress Notes (Signed)
I would call it moderate conscious sedation.  Thank you! ?

## 2021-03-12 ENCOUNTER — Ambulatory Visit: Payer: No Typology Code available for payment source | Admitting: Physical Therapy

## 2021-03-14 ENCOUNTER — Other Ambulatory Visit: Payer: Self-pay

## 2021-03-14 ENCOUNTER — Ambulatory Visit: Payer: No Typology Code available for payment source | Admitting: Physical Therapy

## 2021-03-14 DIAGNOSIS — R269 Unspecified abnormalities of gait and mobility: Secondary | ICD-10-CM

## 2021-03-14 DIAGNOSIS — M6281 Muscle weakness (generalized): Secondary | ICD-10-CM

## 2021-03-14 DIAGNOSIS — Z96652 Presence of left artificial knee joint: Secondary | ICD-10-CM | POA: Diagnosis not present

## 2021-03-14 DIAGNOSIS — M25562 Pain in left knee: Secondary | ICD-10-CM

## 2021-03-14 DIAGNOSIS — M25662 Stiffness of left knee, not elsewhere classified: Secondary | ICD-10-CM

## 2021-03-15 ENCOUNTER — Other Ambulatory Visit: Payer: Self-pay | Admitting: Cardiovascular Disease

## 2021-03-15 ENCOUNTER — Other Ambulatory Visit: Payer: Self-pay

## 2021-03-15 ENCOUNTER — Encounter: Payer: Self-pay | Admitting: Surgery

## 2021-03-15 MED ORDER — ROSUVASTATIN CALCIUM 40 MG PO TABS
ORAL_TABLET | Freq: Every day | ORAL | 0 refills | Status: DC
  Filled 2021-03-15: qty 90, 90d supply, fill #0

## 2021-03-15 MED FILL — Omeprazole Cap Delayed Release 20 MG: ORAL | 90 days supply | Qty: 90 | Fill #3 | Status: AC

## 2021-03-16 NOTE — Therapy (Addendum)
[facility] [address] [place], [place], [address] Phone: [phone]   Fax:  [phone]  Physical Therapy Treatment  Patient Details  Name: Curtis Singh MRN: [id number] Date of Birth: 28-Feb-1955 Referring Provider (PT): Dr. Roland Rack   Encounter Date: 03/14/2021   PT End of Session - 03/16/21 1054     Visit Number 21    Number of Visits 29    Date for PT Re-Evaluation 04/11/21    Authorization - Visit Number 2    Authorization - Number of Visits 10    PT Start Time 1520    PT Stop Time 2707    PT Time Calculation (min) 54 min    Activity Tolerance Patient limited by pain;Patient tolerated treatment well    Behavior During Therapy [facility] for tasks assessed/performed             Past Medical History:  Diagnosis Date   Aortic atherosclerosis ([place])    Coronary artery disease 12/2019   a.) LHC 12/16/2019: EF 55-65%, LVEDP norm. 99% p-mRCA, 20% mRCA. --> PCI placing a 3.0 x 22 mm Resolute Onyx DES to pRCA.   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    HLD (hyperlipidemia)    Hypertension    Long term current use of antithrombotics/antiplatelets    a.) DAPT (ASA + ticagrelor)   Osteoarthritis    Seasonal allergies    T2DM (type 2 diabetes mellitus) ([place])     Past Surgical History:  Procedure Laterality Date   ARTHOSCOPIC ROTAOR CUFF REPAIR Left 02/25/2018   Procedure: LEFT ARTHROSCOPIC ROTATOR CUFF REPAIR; SUBACROMIAL DECOMPRESSION;  Surgeon: Tania Ade, MD;  [facility]: [facility];  Service: Orthopedics;  Laterality: Left;   CATARACT EXTRACTION W/PHACO Right 09/03/2016   Procedure: CATARACT EXTRACTION PHACO AND INTRAOCULAR LENS PLACEMENT ([place]) RIGHT DIABETIC;  Surgeon: Leandrew Koyanagi, MD;  Location: [place];  Service: Ophthalmology;  Laterality: Right;  DIABETIC   COLONOSCOPY WITH PROPOFOL N/A 08/29/2019   Procedure: COLONOSCOPY  WITH PROPOFOL;  Surgeon: Lin Landsman, MD;  [facility]: [facility] ENDOSCOPY;  Service: Gastroenterology;  Laterality: N/A;   CORONARY STENT INTERVENTION N/A 12/16/2019   Procedure: CORONARY STENT INTERVENTION (3.0 x 22 mm Resolute Onyx DES to pRCA);  Surgeon: Wellington Hampshire, MD;  Location: [place] [facility];  Service: Cardiovascular;  Laterality: N/A;   KNEE CLOSED REDUCTION Left 03/06/2021   Procedure: Left knee manipulation and steroid injection;  Surgeon: Corky Mull, MD;  Location: [facility];  Service: Orthopedics;  Laterality: Left;   LEFT HEART CATH AND CORONARY ANGIOGRAPHY N/A 12/16/2019   Procedure: LEFT HEART CATH AND CORONARY ANGIOGRAPHY;  Surgeon: Wellington Hampshire, MD;  Location: [place] [facility];  Service: Cardiovascular;  Laterality: N/A;   ROTATOR CUFF REPAIR Right 01/07/2007   TOTAL KNEE ARTHROPLASTY Left 12/13/2020   Procedure: TOTAL KNEE ARTHROPLASTY;  Surgeon: Corky Mull, MD;  Location: [facility];  Service: Orthopedics;  Laterality: Left;   VASECTOMY N/A     There were no vitals filed for this visit.   Subjective Assessment - 03/17/21 1733     Subjective Pt. cancelled last tx. session secondary to significant c/o L knee pain/ swelling/ bruising over lateral patella.  Pt. states he has returned to work and is trying to walk around without [facility] t/o day.  Pt. states the pain/ tightness has moved to distal quad, instead of knee joint  since manipulation.    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 Pt. 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  Pt. known well to skilled PT services.  Pt. has good caregiver assist for daily household tasks/ driving.    Limitations Standing;Walking;House hold activities    Patient Stated Goals Increase L knee AROM/ strength to improve pain-free mobility and return to work.    Currently in Pain? Yes    Pain Score 5     Pain Location Knee    Pain Orientation Left              Therex.:    Nustep  L4, seat 9-7 for 10 min. B UE/LE  (warm-up)   Seated contract relax 2x5 knee flexion.  Seated L knee extension with holds/ OP at joint line 3x     Manual tx.:     Supine L knee AA/PROM flexion 10x each.  (L knee -8 to 90 degs)   Supine AAROM L knee flexion with strap. 5x (increase static holds)-  STM to L distal quad/ hamstring during stretches.    Supine L patellar mobs (all planes) with OP to encourage knee extension.      PT Long Term Goals - 03/17/21 1744       PT LONG TERM GOAL #1   Title Pt. will increase FOTO to 45 to improve pain-free moblity with daily tasks.    Baseline FOTO: initial: 12.  1/11: 56.  2/20: 69    Time 8    Period Weeks    Status Achieved    Target Date 02/25/21      PT LONG TERM GOAL #2   Title Pt. will increase L knee AROM to 0->115 deg. to improve walking/ stairs/ return to work.    Baseline R knee AROM: -1 to 128 deg. and strength grossly 5/5 MMT. Pt. pain limited with L knee A/PROM: extensino -12/-10 deg., flexion 64/72 deg. (guarded/ pain limted).  1/11: supine L knee (-11 to 86 deg.),  seated L knee flexion 88 deg.  2/20: -11 to 92 deg. L knee AROM.  3/2: -8 to 90 deg.    Time 4    Period Weeks    Status Partially Met    Target Date 04/11/21      PT LONG TERM GOAL #3   Title Pt. will stand from chair with proper technique/ no UE assist to improve pain-free mobility.    Baseline 9/10 L knee pain with sit to stands    Time 4    Period Weeks    Status Partially Met    Target Date 04/11/21      PT LONG TERM GOAL #4   Title Pt. will ambulate community distances with normalized gait pattern/ no assistive device to improve return to work.    Baseline Pt. ambulates with limited L LE wt. bearing and heavy UE assist on RW. Limited R LE step length during L LE weight bearing due to pain.    Time 4    Period Weeks    Status Partially Met    Target Date 04/11/21      PT LONG TERM GOAL #5   Title Pt. will ascend/ descend stairs with recip. pattern  and no handrail to improve functional mobility.    Baseline Heavy UE assist with recip. gait pattern    Time 4    Period Weeks    Status Partially Met    Target  Date 04/11/21      PT LONG TERM GOAL #6   Title Pt. will be able to return to work and perform functional activities with <2 increase in NPS.    Baseline Pain increases to >6 when performing funcational activites. resting NPS 3/10.   2/20: 4/10 L knee pain reported    Time 4    Period Weeks    Status Partially Met    Target Date 04/11/21                   Plan - 03/17/21 1740     Clinical Impression Statement Pt. entered PT clinic with modeate L antalgic gait pattern without use of SPC.  Limited L knee extension noted during L swing through phase of gait and heel strike. Pts. L knee AROM is -8 to 90 deg. after manual stretches/ contract-relax technique.  L knee flexion is potentially limited due to the swelling in distal L quad and bruising noted over L lateral knee joint/ patella since manipulation. Pt. is motivated to aggressively push AROM and return to functional ADLs. Pt. has follow up MD appointment 04/15/21 and returned to work on 03/11/21. Pt. was educated on updated HEP to increase his independence and PT will continue to work on manual techniques to push AROM. Pt. understands the importance of keeping up with HEP over the next few weeks and will work on ROM as well as normalizing his gait    Stability/Clinical Decision Making Evolving/Moderate complexity    Clinical Decision Making Moderate    Rehab Potential Good    PT Frequency 2x / week    PT Duration 4 weeks    PT Treatment/Interventions ADLs/Self Care Home Management;Cryotherapy;Electrical Stimulation;Functional mobility training;Therapeutic activities;Therapeutic exercise;Patient/family education;Neuromuscular re-education;Manual techniques;Scar mobilization;Passive range of motion;Stair training;Gait training;DME Instruction;Balance training    PT Next Visit  Plan Progress L knee ROM.    PT Home Exercise Plan Access Code: [email or web address]    [email or web address] and Agree with Plan of Care Patient             Patient will benefit from skilled therapeutic intervention in order to improve the following deficits and impairments:  Decreased mobility, Decreased strength, Impaired flexibility, Hypomobility, Pain, Abnormal gait, Decreased balance, Difficulty walking, Decreased range of motion, Decreased scar mobility, Decreased activity tolerance  Visit Diagnosis: S/P total knee replacement, left  Joint stiffness of knee, left  Gait difficulty  Acute pain of left knee  Muscle weakness (generalized)     Problem List Patient Active Problem List   Diagnosis Date Noted   Complex tear of medial meniscus of left knee as current injury 08/22/2020   Primary osteoarthritis of left knee 06/06/2020   Left knee pain 03/29/2020   Primary insomnia 03/29/2020   Encounter for medication titration 02/09/2020   Unstable angina (HCC)    Chest discomfort 12/12/2019   History of colonic polyps    Annual physical exam 07/01/2019   ED (erectile dysfunction) of organic origin 02/21/2015   Diabetes mellitus type 2 with complications, uncontrolled 10/18/2014   Hyperlipemia 10/18/2014   Pura Spice, PT, DPT # [id number] 03/17/2021, 5:46 PM  [facility] [address]. [place], [place], [address] Phone: [phone]   Fax:  [phone]  Name: Curtis Singh MRN: [id number] Date of Birth: 1955/06/10

## 2021-03-17 NOTE — Addendum Note (Signed)
Addended by: Pura Spice on: 03/17/2021 05:52 PM ? ? Modules accepted: Orders ? ?

## 2021-03-18 ENCOUNTER — Encounter: Payer: Self-pay | Admitting: Physical Therapy

## 2021-03-18 ENCOUNTER — Other Ambulatory Visit: Payer: Self-pay

## 2021-03-18 ENCOUNTER — Ambulatory Visit: Payer: No Typology Code available for payment source | Admitting: Physical Therapy

## 2021-03-18 DIAGNOSIS — R269 Unspecified abnormalities of gait and mobility: Secondary | ICD-10-CM

## 2021-03-18 DIAGNOSIS — M25562 Pain in left knee: Secondary | ICD-10-CM

## 2021-03-18 DIAGNOSIS — Z96652 Presence of left artificial knee joint: Secondary | ICD-10-CM

## 2021-03-18 DIAGNOSIS — M25662 Stiffness of left knee, not elsewhere classified: Secondary | ICD-10-CM

## 2021-03-18 DIAGNOSIS — M6281 Muscle weakness (generalized): Secondary | ICD-10-CM

## 2021-03-18 NOTE — Therapy (Signed)
[facility] [address]. [place], [place], [address] Phone: [phone]   Fax:  [phone]  Physical Therapy Treatment  Patient Details  Name: Curtis Singh MRN: [id number] Date of Birth: [birth year] Referring Provider (PT): Dr. Roland Rack   Encounter Date: 03/18/2021   PT End of Session - 03/18/21 1244     Visit Number 22    Number of Visits 29    Date for PT Re-Evaluation 04/11/21    Authorization - Visit Number 3    Authorization - Number of Visits 10    PT Start Time 0175    PT Stop Time 1025    PT Time Calculation (min) 51 min    Activity Tolerance Patient limited by pain;Patient tolerated treatment well    Behavior During Therapy [facility] for tasks assessed/performed             Past Medical History:  Diagnosis Date   Aortic atherosclerosis ([place])    Coronary artery disease 12/2019   a.) LHC 12/16/2019: EF 55-65%, LVEDP norm. 99% p-mRCA, 20% mRCA. --> PCI placing a 3.0 x 22 mm Resolute Onyx DES to pRCA.   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    HLD (hyperlipidemia)    Hypertension    Long term current use of antithrombotics/antiplatelets    a.) DAPT (ASA + ticagrelor)   Osteoarthritis    Seasonal allergies    T2DM (type 2 diabetes mellitus) ([place])     Past Surgical History:  Procedure Laterality Date   ARTHOSCOPIC ROTAOR CUFF REPAIR Left 02/25/2018   Procedure: LEFT ARTHROSCOPIC ROTATOR CUFF REPAIR; SUBACROMIAL DECOMPRESSION;  Surgeon: Tania Ade, MD;  [facility]: [facility];  Service: Orthopedics;  Laterality: Left;   CATARACT EXTRACTION W/PHACO Right 09/03/2016   Procedure: CATARACT EXTRACTION PHACO AND INTRAOCULAR LENS PLACEMENT ([place]) RIGHT DIABETIC;  Surgeon: Leandrew Koyanagi, MD;  Location: [place];  Service: Ophthalmology;  Laterality: Right;  DIABETIC   COLONOSCOPY WITH PROPOFOL N/A 08/29/2019   Procedure: COLONOSCOPY  WITH PROPOFOL;  Surgeon: Lin Landsman, MD;  Location: [facility] ENDOSCOPY;  Service: Gastroenterology;  Laterality: N/A;   CORONARY STENT INTERVENTION N/A 12/16/2019   Procedure: CORONARY STENT INTERVENTION (3.0 x 22 mm Resolute Onyx DES to pRCA);  Surgeon: Wellington Hampshire, MD;  Location: [place] [facility];  Service: Cardiovascular;  Laterality: N/A;   KNEE CLOSED REDUCTION Left 03/06/2021   Procedure: Left knee manipulation and steroid injection;  Surgeon: Corky Mull, MD;  Location: [facility];  Service: Orthopedics;  Laterality: Left;   LEFT HEART CATH AND CORONARY ANGIOGRAPHY N/A 12/16/2019   Procedure: LEFT HEART CATH AND CORONARY ANGIOGRAPHY;  Surgeon: Wellington Hampshire, MD;  Location: [place] [facility];  Service: Cardiovascular;  Laterality: N/A;   ROTATOR CUFF REPAIR Right 01/07/2007   TOTAL KNEE ARTHROPLASTY Left 12/13/2020   Procedure: TOTAL KNEE ARTHROPLASTY;  Surgeon: Corky Mull, MD;  Location: [facility];  Service: Orthopedics;  Laterality: Left;   VASECTOMY N/A     There were no vitals filed for this visit.   Subjective Assessment - 03/18/21 1240     Subjective Pt. states brusing of L lateral knee joint/ patella has improved but still sore/ bruised over L medial quad muscle.  Pt. states he feels knee is bending better since last week but remains stiff first thing in the morning with initial steps to bathroom.  Pt. did not sleep  well last night and had some restless leg symptoms.    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 Pt. 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  Pt. known well to skilled PT services.  Pt. has good caregiver assist for daily household tasks/ driving.    Limitations Standing;Walking;House hold activities    Patient Stated Goals Increase L knee AROM/ strength to improve pain-free mobility and return to work.    Currently in Pain? Yes    Pain Score 3     Pain Location Knee    Pain Orientation Left    Pain Descriptors  / Indicators Aching             Therex.:    Nustep L2, seat 9-7 for 10 min. B UE/LE  (warm-up).  Seat 13 for 4 min. To promote L knee extension/ hamstring length.    Walking in //-bars with exaggerated marching/knee flexion and heel strike/ knee extension with mirror feedback 5 laps.    Manual tx.:  Supine L knee AA/PROM flexion 5x each. (slight increase in L knee flexion to >90 deg.)- pain limited.    STM to L distal quad/ hamstring during stretches.  MH applied to L hamstring in prone/ quad in supine position.    Prone L knee extension hangs/ STM to L distal hamstring with OP and contract-relax technique.     Supine L patellar mobs (all planes) with OP to encourage knee extension.    Seated contract relax 2x5 knee flexion.  Seated L knee extension with holds/ OP at joint line 3x      PT Short Term Goals - 01/16/21 1248       PT SHORT TERM GOAL #1   Baseline --               PT Long Term Goals - 03/17/21 1744       PT LONG TERM GOAL #1   Title Pt. will increase FOTO to 45 to improve pain-free moblity with daily tasks.    Baseline FOTO: initial: 12.  1/11: 56.  2/20: 69    Time 8    Period Weeks    Status Achieved    Target Date 02/25/21      PT LONG TERM GOAL #2   Title Pt. will increase L knee AROM to 0->115 deg. to improve walking/ stairs/ return to work.    Baseline R knee AROM: -1 to 128 deg. and strength grossly 5/5 MMT. Pt. pain limited with L knee A/PROM: extensino -12/-10 deg., flexion 64/72 deg. (guarded/ pain limted).  1/11: supine L knee (-11 to 86 deg.),  seated L knee flexion 88 deg.  2/20: -11 to 92 deg. L knee AROM.  3/2: -8 to 90 deg.    Time 4    Period Weeks    Status Partially Met    Target Date 04/11/21      PT LONG TERM GOAL #3   Title Pt. will stand from chair with proper technique/ no UE assist to improve pain-free mobility.    Baseline 9/10 L knee pain with sit to stands    Time 4    Period Weeks    Status Partially Met     Target Date 04/11/21      PT LONG TERM GOAL #4   Title Pt. will ambulate community distances with normalized gait pattern/ no assistive device to improve return to work.    Baseline Pt. ambulates with limited  L LE wt. bearing and heavy UE assist on RW. Limited R LE step length during L LE weight bearing due to pain.    Time 4    Period Weeks    Status Partially Met    Target Date 04/11/21      PT LONG TERM GOAL #5   Title Pt. will ascend/ descend stairs with recip. pattern and no handrail to improve functional mobility.    Baseline Heavy UE assist with recip. gait pattern    Time 4    Period Weeks    Status Partially Met    Target Date 04/11/21      PT LONG TERM GOAL #6   Title Pt. will be able to return to work and perform functional activities with <2 increase in NPS.    Baseline Pain increases to >6 when performing funcational activites. resting NPS 3/10.   2/20: 4/10 L knee pain reported    Time 4    Period Weeks    Status Partially Met    Target Date 04/11/21                   Plan - 03/18/21 1245     Clinical Impression Statement Slight increase in L knee extension noted today during prone knee extension hangs/ contract-relax stretches.  Pt. tends to compensate at hip and instructed in proper technique/ static holds to promote muscle lengthening.  Moderate L distal quad/ knee joint pain during seated contract-relax stretches/ knee flexion AA/PROM with static holds.  Pt. demonstrates >90 deg. flexion but significant L knee pain reported, esp. during increase static holds.  Pt. remains pain limited/ muscle guarded during manual stretches and cued to focus on L heel strike/ toe off during gait to correct limited L knee extension while using SPC.  Pt. will continue to benefit from skilled PT services to increase L knee ROM/ strength to improve pain-free mobility.  Pt. [place] beach on [place] for a vacation and will return to PT tx. tomorrow.     Stability/Clinical Decision Making Evolving/Moderate complexity    Clinical Decision Making Moderate    Rehab Potential Good    PT Frequency 2x / week    PT Duration 4 weeks    PT Treatment/Interventions ADLs/Self Care Home Management;Cryotherapy;Electrical Stimulation;Functional mobility training;Therapeutic activities;Therapeutic exercise;Patient/family education;Neuromuscular re-education;Manual techniques;Scar mobilization;Passive range of motion;Stair training;Gait training;DME Instruction;Balance training    PT Next Visit Plan Progress L knee ROM.    PT Home Exercise Plan Access Code: [email or web address]    Consulted and Agree with Plan of Care Patient             Patient will benefit from skilled therapeutic intervention in order to improve the following deficits and impairments:  Decreased mobility, Decreased strength, Impaired flexibility, Hypomobility, Pain, Abnormal gait, Decreased balance, Difficulty walking, Decreased range of motion, Decreased scar mobility, Decreased activity tolerance  Visit Diagnosis: S/P total knee replacement, left  Joint stiffness of knee, left  Gait difficulty  Acute pain of left knee  Muscle weakness (generalized)     Problem List Patient Active Problem List   Diagnosis Date Noted   Complex tear of medial meniscus of left knee as current injury 08/22/2020   Primary osteoarthritis of left knee 06/06/2020   Left knee pain 03/29/2020   Primary insomnia 03/29/2020   Encounter for medication titration 02/09/2020   Unstable angina ([place])    Chest discomfort 12/12/2019   History of colonic polyps    Annual physical exam  07/01/2019   ED (erectile dysfunction) of organic origin 02/21/2015   Diabetes mellitus type 2 with complications, uncontrolled 10/18/2014   Hyperlipemia 10/18/2014   Pura Spice, PT, DPT # [phone] 03/18/2021, 12:50 PM  [facility] [address]. [place], [place], [address] Phone:  [phone]   Fax:  [phone]  Name: Curtis Singh MRN: [id number] Date of Birth: 1955-04-30

## 2021-03-19 ENCOUNTER — Other Ambulatory Visit: Payer: Self-pay

## 2021-03-19 ENCOUNTER — Encounter: Payer: Self-pay | Admitting: Physical Therapy

## 2021-03-19 ENCOUNTER — Ambulatory Visit: Payer: No Typology Code available for payment source | Admitting: Physical Therapy

## 2021-03-19 DIAGNOSIS — Z96652 Presence of left artificial knee joint: Secondary | ICD-10-CM

## 2021-03-19 DIAGNOSIS — M25662 Stiffness of left knee, not elsewhere classified: Secondary | ICD-10-CM

## 2021-03-19 DIAGNOSIS — M6281 Muscle weakness (generalized): Secondary | ICD-10-CM

## 2021-03-19 DIAGNOSIS — M25562 Pain in left knee: Secondary | ICD-10-CM

## 2021-03-19 DIAGNOSIS — R269 Unspecified abnormalities of gait and mobility: Secondary | ICD-10-CM

## 2021-03-19 MED ORDER — HYDROCODONE-ACETAMINOPHEN 5-325 MG PO TABS
ORAL_TABLET | ORAL | 0 refills | Status: DC
  Filled 2021-03-19: qty 21, 7d supply, fill #0

## 2021-03-19 NOTE — Therapy (Signed)
[place] ?[facility] ?[address] [place], [place], [address] ?Phone: [phone]   Fax:  [phone] ? ?Physical Therapy Treatment ? ?Patient Details  ?Name: Curtis Singh ?MRN: [id number] ?Date of Birth: 03/26/1955 ?Referring Provider (PT): Dr. Roland Rack ? ? ?Encounter Date: 03/19/2021 ? ? PT End of Session - 03/19/21 1513   ? ? Visit Number 23   ? Number of Visits 29   ? Date for PT Re-Evaluation 04/11/21   ? Authorization - Visit Number 4   ? Authorization - Number of Visits 10   ? PT Start Time 1508   ? PT Stop Time 7116   ? PT Time Calculation (min) 51 min   ? Activity Tolerance Patient limited by pain;Patient tolerated treatment well   ? Behavior During Therapy [facility] for tasks assessed/performed   ? ?  ?  ? ?  ? ? ?Past Medical History:  ?Diagnosis Date  ? Aortic atherosclerosis ([place])   ? Coronary artery disease 12/2019  ? a.) LHC 12/16/2019: EF 55-65%, LVEDP norm. 99% p-mRCA, 20% mRCA. --> PCI placing a 3.0 x 22 mm Resolute Onyx DES to pRCA.  ? ED (erectile dysfunction)   ? GERD (gastroesophageal reflux disease)   ? 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 HLD (hyperlipidemia)   ? Hypertension   ? Long term current use of antithrombotics/antiplatelets   ? a.) DAPT (ASA + ticagrelor)  ? Osteoarthritis   ? Seasonal allergies   ? T2DM (type 2 diabetes mellitus) ([place])   ? ? ?Past Surgical History:  ?Procedure Laterality Date  ? ARTHOSCOPIC ROTAOR CUFF REPAIR Left 02/25/2018  ? Procedure: LEFT ARTHROSCOPIC ROTATOR CUFF REPAIR; SUBACROMIAL DECOMPRESSION;  Surgeon: Tania Ade, MD;  [facility];  Service: Orthopedics;  Laterality: Left;  ? CATARACT EXTRACTION W/PHACO Right 09/03/2016  ? Procedure: CATARACT EXTRACTION PHACO AND INTRAOCULAR LENS PLACEMENT ([place]) RIGHT DIABETIC;  Surgeon: Leandrew Koyanagi, MD;  Location: [place];  Service: Ophthalmology;  Laterality: Right;  DIABETIC  ? COLONOSCOPY WITH PROPOFOL N/A 08/29/2019  ? Procedure: COLONOSCOPY  WITH PROPOFOL;  Surgeon: Lin Landsman, MD;  Location: [facility];  Service: Gastroenterology;  Laterality: N/A;  ? CORONARY STENT INTERVENTION N/A 12/16/2019  ? Procedure: CORONARY STENT INTERVENTION (3.0 x 22 mm Resolute Onyx DES to pRCA);  Surgeon: Wellington Hampshire, MD;  Location: [place] [facility];  Service: Cardiovascular;  Laterality: N/A;  ? KNEE CLOSED REDUCTION Left 03/06/2021  ? Procedure: Left knee manipulation and steroid injection;  Surgeon: Corky Mull, MD;  Location: [facility];  Service: Orthopedics;  Laterality: Left;  ? LEFT HEART CATH AND CORONARY ANGIOGRAPHY N/A 12/16/2019  ? Procedure: LEFT HEART CATH AND CORONARY ANGIOGRAPHY;  Surgeon: Wellington Hampshire, MD;  Location: [place] [facility];  Service: Cardiovascular;  Laterality: N/A;  ? ROTATOR CUFF REPAIR Right 01/07/2007  ? TOTAL KNEE ARTHROPLASTY Left 12/13/2020  ? Procedure: TOTAL KNEE ARTHROPLASTY;  Surgeon: Corky Mull, MD;  Location: [facility];  Service: Orthopedics;  Laterality: Left;  ? VASECTOMY N/A   ? ? ?There were no vitals filed for this visit. ? ? Subjective Assessment - 03/19/21 1511   ? ? Subjective Pt. had f/u with Cameron Proud this morning.  Pt. states MD was able to get 140 deg. flexion during anestheia/ manipulation.  Pt. returns to Dr. Roland Rack on 4/10.   ? 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 Pt. is scheduled to  be OOW for 8 weeks.  Pt. known well to skilled PT services.  Pt. has good caregiver assist for daily household tasks/ driving.   ? Limitations Standing;Walking;House hold activities   ? Patient Stated Goals Increase L knee AROM/ strength to improve pain-free mobility and return to work.   ? Currently in Pain? Yes   ? Pain Score 3    ? Pain Location Knee   ? Pain Orientation Left   ? Pain Descriptors / Indicators Aching   ? ?  ?  ? ?  ? ? ? ?Therex.:  ?  ?Nustep L2, seat 9-7 for 10 min. B UE/LE  (warm-up).   ? ?2nd step knee flexion/ hamstring stretches 10x  each.   ? ?TG knee flexion with static holds/ heel raises 20x each.  Pain limited with increase flexion holds (90 deg. L knee flexion).   ?  ?Supine L LE knee to chest with yellow ball (maintaining L knee in midline).   ? ?Walking in hallway with gait reassessment/ cuing for arm swing and heel strike/ knee extension with upright posture.  No SPC.   ?  ? ?  ?Manual tx.: ?  ?Supine L knee AA/PROM flexion 5x each (pain limited).  Reassessment of L medial thigh bruising with tenderness present.   ?  ?Supine L patellar mobs (all planes) with OP to encourage knee extension.   ?  ?Seated contract relax 4x3 (98 deg. At best/ moderate pain and unable to maintain a static hold).  Seated L knee extension with holds/ OP at joint line 3x. ? ? ? ? ? PT Long Term Goals - 03/17/21 1744   ? ?  ? PT LONG TERM GOAL #1  ? Title Pt. will increase FOTO to 45 to improve pain-free moblity with daily tasks.   ? Baseline FOTO: initial: 12.  1/11: 56.  2/20: 69   ? Time 8   ? Period Weeks   ? Status Achieved   ? Target Date 02/25/21   ?  ? PT LONG TERM GOAL #2  ? Title Pt. will increase L knee AROM to 0->115 deg. to improve walking/ stairs/ return to work.   ? Baseline R knee AROM: -1 to 128 deg. and strength grossly 5/5 MMT. Pt. pain limited with L knee A/PROM: extensino -12/-10 deg., flexion 64/72 deg. (guarded/ pain limted).  1/11: supine L knee (-11 to 86 deg.),  seated L knee flexion 88 deg.  2/20: -11 to 92 deg. L knee AROM.  3/2: -8 to 90 deg.   ? Time 4   ? Period Weeks   ? Status Partially Met   ? Target Date 04/11/21   ?  ? PT LONG TERM GOAL #3  ? Title Pt. will stand from chair with proper technique/ no UE assist to improve pain-free mobility.   ? Baseline 9/10 L knee pain with sit to stands   ? Time 4   ? Period Weeks   ? Status Partially Met   ? Target Date 04/11/21   ?  ? PT LONG TERM GOAL #4  ? Title Pt. will ambulate community distances with normalized gait pattern/ no assistive device to improve return to work.   ? Baseline  Pt. ambulates with limited L LE wt. bearing and heavy UE assist on RW. Limited R LE step length during L LE weight bearing due to pain.   ? Time 4   ? Period Weeks   ? Status Partially Met   ?  Target Date 04/11/21   ?  ? PT LONG TERM GOAL #5  ? Title Pt. will ascend/ descend stairs with recip. pattern and no handrail to improve functional mobility.   ? Baseline Heavy UE assist with recip. gait pattern   ? Time 4   ? Period Weeks   ? Status Partially Met   ? Target Date 04/11/21   ?  ? PT LONG TERM GOAL #6  ? Title Pt. will be able to return to work and perform functional activities with <2 increase in NPS.   ? Baseline Pain increases to >6 when performing funcational activites. resting NPS 3/10.   2/20: 4/10 L knee pain reported   ? Time 4   ? Period Weeks   ? Status Partially Met   ? Target Date 04/11/21   ? ?  ?  ? ?  ? ? ? ? ? ? ? ? Plan - 03/19/21 1514   ? ? Clinical Impression Statement Pt. showed a slight increase in L knee flexion to 98 deg. during seated contract-relax knee flexion.  Pt. unable to maintain flexion actively secondary to pain.  Moderate L medial thigh ecchymosis noted but marked improvement in L lateral knee joint bruising.  PT reeducated pt. on a more normalized gait with consistent arm swing.  L knee extension remains limited during wt. bearing.  Pt. will continue with HEP on a consistent basis while on vacation at [facility].   ? Stability/Clinical Decision Making Evolving/Moderate complexity   ? Clinical Decision Making Moderate   ? Rehab Potential Good   ? PT Frequency 2x / week   ? PT Duration 4 weeks   ? PT Treatment/Interventions ADLs/Self Care Home Management;Cryotherapy;Electrical Stimulation;Functional mobility training;Therapeutic activities;Therapeutic exercise;Patient/family education;Neuromuscular re-education;Manual techniques;Scar mobilization;Passive range of motion;Stair training;Gait training;DME Instruction;Balance training   ? PT Next Visit Plan Progress L knee ROM.   ?  PT Home Exercise Plan Access Code: [email or web address]   ? Consulted and Agree with Plan of Care Patient   ? ?  ?  ? ?  ? ? ?Patient will benefit from skilled therapeutic intervention in order to improve the follow

## 2021-03-21 ENCOUNTER — Encounter: Payer: No Typology Code available for payment source | Admitting: Physical Therapy

## 2021-03-27 ENCOUNTER — Other Ambulatory Visit: Payer: Self-pay

## 2021-03-27 ENCOUNTER — Other Ambulatory Visit: Payer: Self-pay | Admitting: Internal Medicine

## 2021-03-27 MED ORDER — ZOLPIDEM TARTRATE 10 MG PO TABS
ORAL_TABLET | ORAL | 1 refills | Status: DC
  Filled 2021-03-27: qty 30, 30d supply, fill #0
  Filled 2021-04-24: qty 30, 30d supply, fill #1

## 2021-03-27 MED ORDER — ZOLPIDEM TARTRATE 10 MG PO TABS: ORAL_TABLET | ORAL | 1 refills | Status: DC

## 2021-03-28 ENCOUNTER — Ambulatory Visit: Payer: No Typology Code available for payment source | Admitting: Physical Therapy

## 2021-03-28 ENCOUNTER — Encounter: Payer: Self-pay | Admitting: Physical Therapy

## 2021-03-28 ENCOUNTER — Other Ambulatory Visit: Payer: Self-pay

## 2021-03-28 DIAGNOSIS — M25562 Pain in left knee: Secondary | ICD-10-CM

## 2021-03-28 DIAGNOSIS — R269 Unspecified abnormalities of gait and mobility: Secondary | ICD-10-CM

## 2021-03-28 DIAGNOSIS — Z96652 Presence of left artificial knee joint: Secondary | ICD-10-CM

## 2021-03-28 DIAGNOSIS — M25662 Stiffness of left knee, not elsewhere classified: Secondary | ICD-10-CM

## 2021-03-28 NOTE — Therapy (Signed)
[place] ?[facility] ?[address] [place], [place], [address] ?Phone: [phone]   Fax:  [phone] ? ?Physical Therapy Treatment ? ?Patient Details  ?Name: Curtis Singh ?MRN: [id number] ?Date of Birth: December 30, 1955 ?Referring Provider (PT): Dr. Roland Rack ? ? ?Encounter Date: 03/28/2021 ? ?Treatment: 24 to 29.  Recert date: 0[birth year] ?1507 to 1545 ? ? ?Past Medical History:  ?Diagnosis Date  ? Aortic atherosclerosis ([place])   ? Coronary artery disease 12/2019  ? a.) LHC 12/16/2019: EF 55-65%, LVEDP norm. 99% p-mRCA, 20% mRCA. --> PCI placing a 3.0 x 22 mm Resolute Onyx DES to pRCA.  ? ED (erectile dysfunction)   ? GERD (gastroesophageal reflux disease)   ? 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 HLD (hyperlipidemia)   ? Hypertension   ? Long term current use of antithrombotics/antiplatelets   ? a.) DAPT (ASA + ticagrelor)  ? Osteoarthritis   ? Seasonal allergies   ? T2DM (type 2 diabetes mellitus) ([place])   ? ? ?Past Surgical History:  ?Procedure Laterality Date  ? ARTHOSCOPIC ROTAOR CUFF REPAIR Left 02/25/2018  ? Procedure: LEFT ARTHROSCOPIC ROTATOR CUFF REPAIR; SUBACROMIAL DECOMPRESSION;  Surgeon: Tania Ade, MD;  [facility];  Service: Orthopedics;  Laterality: Left;  ? CATARACT EXTRACTION W/PHACO Right 09/03/2016  ? Procedure: CATARACT EXTRACTION PHACO AND INTRAOCULAR LENS PLACEMENT ([place]) RIGHT DIABETIC;  Surgeon: Leandrew Koyanagi, MD;  Location: [place];  Service: Ophthalmology;  Laterality: Right;  DIABETIC  ? COLONOSCOPY WITH PROPOFOL N/A 08/29/2019  ? Procedure: COLONOSCOPY WITH PROPOFOL;  Surgeon: Lin Landsman, MD;  Location: [facility];  Service: Gastroenterology;  Laterality: N/A;  ? CORONARY STENT INTERVENTION N/A 12/16/2019  ? Procedure: CORONARY STENT INTERVENTION (3.0 x 22 mm Resolute Onyx DES to pRCA);  Surgeon: Wellington Hampshire, MD;  Location: [place] [facility];  Service: Cardiovascular;  Laterality: N/A;  ? KNEE CLOSED  REDUCTION Left 03/06/2021  ? Procedure: Left knee manipulation and steroid injection;  Surgeon: Corky Mull, MD;  Location: [facility];  Service: Orthopedics;  Laterality: Left;  ? LEFT HEART CATH AND CORONARY ANGIOGRAPHY N/A 12/16/2019  ? Procedure: LEFT HEART CATH AND CORONARY ANGIOGRAPHY;  Surgeon: Wellington Hampshire, MD;  Location: [place] [facility];  Service: Cardiovascular;  Laterality: N/A;  ? ROTATOR CUFF REPAIR Right 01/07/2007  ? TOTAL KNEE ARTHROPLASTY Left 12/13/2020  ? Procedure: TOTAL KNEE ARTHROPLASTY;  Surgeon: Corky Mull, MD;  Location: [facility];  Service: Orthopedics;  Laterality: Left;  ? VASECTOMY N/A   ? ? ?There were no vitals filed for this visit. ? ? ? ?Pt. states he had a great time at [facility] and did a lot of walking. Pt. states his L knee swelled up by Saturday afternoon. Pt. has been compliant with icing/ daily activity. ? ? ? ? ?Therex.:  ?  ?Nustep L2, seat 9-6 for 10+ min. B UE/LE  (warm-up).   ? ?TG knee flexion with static holds/ heel raises 20x each.   ?  ?  ?Manual tx.: ?  ?Supine L knee AA/PROM flexion 5x each (pain limited).  Reassessment of L medial thigh bruising with tenderness present (more generalized).   ?  ?Supine L patellar mobs (all planes) with OP to encourage knee extension.   ?  ?Seated contract-relax to L knee.  Supine L knee AAROM: -2 to 100 deg.   ?  ? ? ? ? PT Long Term Goals - 03/17/21 1744   ? ?  ?  PT LONG TERM GOAL #1  ? Title Pt. will increase FOTO to 45 to improve pain-free moblity with daily tasks.   ? Baseline FOTO: initial: 12.  1/11: 56.  2/20: 69   ? Time 8   ? Period Weeks   ? Status Achieved   ? Target Date 02/25/21   ?  ? PT LONG TERM GOAL #2  ? Title Pt. will increase L knee AROM to 0->115 deg. to improve walking/ stairs/ return to work.   ? Baseline R knee AROM: -1 to 128 deg. and strength grossly 5/5 MMT. Pt. pain limited with L knee A/PROM: extensino -12/-10 deg., flexion 64/72 deg. (guarded/ pain limted).  1/11: supine L knee (-11 to 86  deg.),  seated L knee flexion 88 deg.  2/20: -11 to 92 deg. L knee AROM.  3/2: -8 to 90 deg.   ? Time 4   ? Period Weeks   ? Status Partially Met   ? Target Date 04/11/21   ?  ? PT LONG TERM GOAL #3  ? Title Pt. will stand from chair with proper technique/ no UE assist to improve pain-free mobility.   ? Baseline 9/10 L knee pain with sit to stands   ? Time 4   ? Period Weeks   ? Status Partially Met   ? Target Date 04/11/21   ?  ? PT LONG TERM GOAL #4  ? Title Pt. will ambulate community distances with normalized gait pattern/ no assistive device to improve return to work.   ? Baseline Pt. ambulates with limited L LE wt. bearing and heavy UE assist on RW. Limited R LE step length during L LE weight bearing due to pain.   ? Time 4   ? Period Weeks   ? Status Partially Met   ? Target Date 04/11/21   ?  ? PT LONG TERM GOAL #5  ? Title Pt. will ascend/ descend stairs with recip. pattern and no handrail to improve functional mobility.   ? Baseline Heavy UE assist with recip. gait pattern   ? Time 4   ? Period Weeks   ? Status Partially Met   ? Target Date 04/11/21   ?  ? PT LONG TERM GOAL #6  ? Title Pt. will be able to return to work and perform functional activities with <2 increase in NPS.   ? Baseline Pain increases to >6 when performing funcational activites. resting NPS 3/10.   2/20: 4/10 L knee pain reported   ? Time 4   ? Period Weeks   ? Status Partially Met   ? Target Date 04/11/21   ? ?  ?  ? ?  ? ? ? ? ? ? ? ? Plan - 03/30/21 1409   ? ? Clinical Impression Statement Pt. discussed progressing to more independent based exercise and PT will assist wtih developing a program/ ensure pt. is increasing knee AROM.  Pt. demonstrates -2 to 100 deg. L knee AAROM in supine position.  Pt. will work hard this weekend on progressing L knee flexion/ extension ROM and normalize walking without assistive device.  PT will continue to instruct pt. until MD f/u in April.  No charge for tx. session.  No change to HEP.   ?  Stability/Clinical Decision Making Evolving/Moderate complexity   ? Clinical Decision Making Moderate   ? Rehab Potential Good   ? PT Frequency 2x / week   ? PT Duration 4 weeks   ? PT Treatment/Interventions  ADLs/Self Care Home Management;Cryotherapy;Electrical Stimulation;Functional mobility training;Therapeutic activities;Therapeutic exercise;Patient/family education;Neuromuscular re-education;Manual techniques;Scar mobilization;Passive range of motion;Stair training;Gait training;DME Instruction;Balance training   ? PT Next Visit Plan Progress L knee ROM.   ? PT Home Exercise Plan Access Code: [email or web address]   ? Consulted and Agree with Plan of Care Patient   ? ?  ?  ? ?  ? ? ?Patient will benefit from skilled therapeutic intervention in order to improve the following deficits and impairments:  Decreased mobility, Decreased strength, Impaired flexibility, Hypomobility, Pain, Abnormal gait, Decreased balance, Difficulty walking, Decreased range of motion, Decreased scar mobility, Decreased activity tolerance ? ?Visit Diagnosis: ?S/P total knee replacement, left ? ?Joint stiffness of knee, left ? ?Gait difficulty ? ?Acute pain of left knee ? ? ? ? ?Problem List ?Patient Active Problem List  ? Diagnosis Date Noted  ? Complex tear of medial meniscus of left knee as current injury 08/22/2020  ? Primary osteoarthritis of left knee 06/06/2020  ? Left knee pain 03/29/2020  ? Primary insomnia 03/29/2020  ? Encounter for medication titration 02/09/2020  ? Unstable angina ([facility])   ? Chest discomfort 12/12/2019  ? History of colonic polyps   ? Annual physical exam 07/01/2019  ? ED (erectile dysfunction) of organic origin 02/21/2015  ? Diabetes mellitus type 2 with complications, uncontrolled 10/18/2014  ? Hyperlipemia 10/18/2014  ? ?Pura Spice, PT, DPT # [phone] ?03/30/2021, 2:15 PM ? ?[place] ?[facility] ?[address] [place], [place], [address] ?Phone: [phone]   Fax:   [phone] ? ?Name: Curtis Singh ?MRN: [id number] ?Date of Birth: 1955-07-30 ? ? ? ?

## 2021-03-29 ENCOUNTER — Other Ambulatory Visit: Payer: Self-pay

## 2021-04-04 ENCOUNTER — Ambulatory Visit: Payer: No Typology Code available for payment source | Admitting: Physical Therapy

## 2021-04-04 DIAGNOSIS — M25562 Pain in left knee: Secondary | ICD-10-CM

## 2021-04-04 DIAGNOSIS — R269 Unspecified abnormalities of gait and mobility: Secondary | ICD-10-CM

## 2021-04-04 DIAGNOSIS — Z96652 Presence of left artificial knee joint: Secondary | ICD-10-CM

## 2021-04-04 DIAGNOSIS — M25662 Stiffness of left knee, not elsewhere classified: Secondary | ICD-10-CM

## 2021-04-04 DIAGNOSIS — M6281 Muscle weakness (generalized): Secondary | ICD-10-CM

## 2021-04-06 NOTE — Therapy (Signed)
?[facility] ?[address] [place], [place], [address] ?Phone: [phone]   Fax:  [phone] ? ?Physical Therapy Treatment ? ?Patient Details  ?Name: Curtis Singh ?MRN: [id number] ?Date of Birth: 01-Dec-1955 ?Referring Provider (PT): Dr. Roland Rack ? ? ?Encounter Date: 04/04/2021 ? ? PT End of Session - 04/06/21 0815   ? ? Visit Number 25   ? Number of Visits 29   ? Date for PT Re-Evaluation 04/11/21   ? Authorization - Visit Number 5   ? Authorization - Number of Visits 10   ? PT Start Time 1510   ? PT Stop Time 0093   ? PT Time Calculation (min) 29 min   ? Activity Tolerance Patient limited by pain;Patient tolerated treatment well   ? Behavior During Therapy [facility] for tasks assessed/performed   ? ?  ?  ? ?  ? ? ?Past Medical History:  ?Diagnosis Date  ? Aortic atherosclerosis ([place])   ? Coronary artery disease 12/2019  ? a.) LHC 12/16/2019: EF 55-65%, LVEDP norm. 99% p-mRCA, 20% mRCA. --> PCI placing a 3.0 x 22 mm Resolute Onyx DES to pRCA.  ? ED (erectile dysfunction)   ? GERD (gastroesophageal reflux disease)   ? 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 HLD (hyperlipidemia)   ? Hypertension   ? Long term current use of antithrombotics/antiplatelets   ? a.) DAPT (ASA + ticagrelor)  ? Osteoarthritis   ? Seasonal allergies   ? T2DM (type 2 diabetes mellitus) ([place])   ? ? ?Past Surgical History:  ?Procedure Laterality Date  ? ARTHOSCOPIC ROTAOR CUFF REPAIR Left 02/25/2018  ? Procedure: LEFT ARTHROSCOPIC ROTATOR CUFF REPAIR; SUBACROMIAL DECOMPRESSION;  Surgeon: Tania Ade, MD;  [facility];  Service: Orthopedics;  Laterality: Left;  ? CATARACT EXTRACTION W/PHACO Right 09/03/2016  ? Procedure: CATARACT EXTRACTION PHACO AND INTRAOCULAR LENS PLACEMENT ([place]) RIGHT DIABETIC;  Surgeon: Leandrew Koyanagi, MD;  Location: [employer];  Service: Ophthalmology;  Laterality: Right;  DIABETIC  ? COLONOSCOPY WITH PROPOFOL N/A 08/29/2019  ? Procedure: COLONOSCOPY  WITH PROPOFOL;  Surgeon: Lin Landsman, MD;  [facility];  Service: Gastroenterology;  Laterality: N/A;  ? CORONARY STENT INTERVENTION N/A 12/16/2019  ? Procedure: CORONARY STENT INTERVENTION (3.0 x 22 mm Resolute Onyx DES to pRCA);  Surgeon: Wellington Hampshire, MD;  Location: [place] CV LAB;  Service: Cardiovascular;  Laterality: N/A;  ? KNEE CLOSED REDUCTION Left 03/06/2021  ? Procedure: Left knee manipulation and steroid injection;  Surgeon: Corky Mull, MD;  Location: [facility];  Service: Orthopedics;  Laterality: Left;  ? LEFT HEART CATH AND CORONARY ANGIOGRAPHY N/A 12/16/2019  ? Procedure: LEFT HEART CATH AND CORONARY ANGIOGRAPHY;  Surgeon: Wellington Hampshire, MD;  Location: [place] [facility];  Service: Cardiovascular;  Laterality: N/A;  ? ROTATOR CUFF REPAIR Right 01/07/2007  ? TOTAL KNEE ARTHROPLASTY Left 12/13/2020  ? Procedure: TOTAL KNEE ARTHROPLASTY;  Surgeon: Corky Mull, MD;  Location: [facility];  Service: Orthopedics;  Laterality: Left;  ? VASECTOMY N/A   ? ? ?There were no vitals filed for this visit. ? ? ? ? ? ? ? ? ? ? ? ? ? ? ? ? ? ? ? PT Long Term Goals - 03/17/21 1744   ? ?  ? PT LONG TERM GOAL #1  ? Title Pt. will increase FOTO to 45 to improve pain-free moblity with daily tasks.   ? Baseline FOTO: initial: 12.  1/11:  56.  2/20: 69   ? Time 8   ? Period Weeks   ? Status Achieved   ? Target Date 02/25/21   ?  ? PT LONG TERM GOAL #2  ? Title Pt. will increase L knee AROM to 0->115 deg. to improve walking/ stairs/ return to work.   ? Baseline R knee AROM: -1 to 128 deg. and strength grossly 5/5 MMT. Pt. pain limited with L knee A/PROM: extensino -12/-10 deg., flexion 64/72 deg. (guarded/ pain limted).  1/11: supine L knee (-11 to 86 deg.),  seated L knee flexion 88 deg.  2/20: -11 to 92 deg. L knee AROM.  3/2: -8 to 90 deg.   ? Time 4   ? Period Weeks   ? Status Partially Met   ? Target Date 04/11/21   ?  ? PT LONG TERM GOAL #3  ? Title Pt. will stand from chair with proper  technique/ no UE assist to improve pain-free mobility.   ? Baseline 9/10 L knee pain with sit to stands   ? Time 4   ? Period Weeks   ? Status Partially Met   ? Target Date 04/11/21   ?  ? PT LONG TERM GOAL #4  ? Title Pt. will ambulate community distances with normalized gait pattern/ no assistive device to improve return to work.   ? Baseline Pt. ambulates with limited L LE wt. bearing and heavy UE assist on RW. Limited R LE step length during L LE weight bearing due to pain.   ? Time 4   ? Period Weeks   ? Status Partially Met   ? Target Date 04/11/21   ?  ? PT LONG TERM GOAL #5  ? Title Pt. will ascend/ descend stairs with recip. pattern and no handrail to improve functional mobility.   ? Baseline Heavy UE assist with recip. gait pattern   ? Time 4   ? Period Weeks   ? Status Partially Met   ? Target Date 04/11/21   ?  ? PT LONG TERM GOAL #6  ? Title Pt. will be able to return to work and perform functional activities with <2 increase in NPS.   ? Baseline Pain increases to >6 when performing funcational activites. resting NPS 3/10.   2/20: 4/10 L knee pain reported   ? Time 4   ? Period Weeks   ? Status Partially Met   ? Target Date 04/11/21   ? ?  ?  ? ?  ? ? ? ? ? ? ? ? Plan - 04/06/21 0816   ? ? Clinical Impression Statement No skilled tx. session.  Pt. came to PT to ride [employer] and complete stretches on an independent basis.  Pt. returns to MD for f/u in 2 weeks and is hoping to get >110 deg. knee flexion.  No charge for tx. session.   ? Stability/Clinical Decision Making Evolving/Moderate complexity   ? Rehab Potential Good   ? PT Frequency 2x / week   ? PT Duration 4 weeks   ? PT Treatment/Interventions ADLs/Self Care Home Management;Cryotherapy;Electrical Stimulation;Functional mobility training;Therapeutic activities;Therapeutic exercise;Patient/family education;Neuromuscular re-education;Manual techniques;Scar mobilization;Passive range of motion;Stair training;Gait training;DME Instruction;Balance  training   ? PT Next Visit Plan Progress L knee ROM.   ? PT Home Exercise Plan Access Code: [email or web address]   ? Consulted and Agree with Plan of Care Patient   ? ?  ?  ? ?  ? ? ?Patient will benefit from skilled therapeutic intervention  in order to improve the following deficits and impairments:  Decreased mobility, Decreased strength, Impaired flexibility, Hypomobility, Pain, Abnormal gait, Decreased balance, Difficulty walking, Decreased range of motion, Decreased scar mobility, Decreased activity tolerance ? ?Visit Diagnosis: ?S/P total knee replacement, left ? ?Joint stiffness of knee, left ? ?Gait difficulty ? ?Acute pain of left knee ? ?Muscle weakness (generalized) ? ? ? ? ?Problem List ?Patient Active Problem List  ? Diagnosis Date Noted  ? Complex tear of medial meniscus of left knee as current injury 08/22/2020  ? Primary osteoarthritis of left knee 06/06/2020  ? Left knee pain 03/29/2020  ? Primary insomnia 03/29/2020  ? Encounter for medication titration 02/09/2020  ? Unstable angina (H[facility])   ? Chest discomfort 12/12/2019  ? History of colonic polyps   ? Annual physical exam 07/01/2019  ? ED (erectile dysfunction) of organic origin 02/21/2015  ? Diabetes mellitus type 2 with complications, uncontrolled 10/18/2014  ? Hyperlipemia 10/18/2014  ? ?Pura Spice, PT, DPT # [phone] ?04/06/2021, 8:20 AM ? ?[place] ?[facility] ?[address]. [place], [place], [address] ?Phone: [phone]   Fax:  [phone] ? ?Name: Curtis Singh ?MRN: [id number] ?Date of Birth: 11/17/1955 ? ? ? ?

## 2021-04-08 ENCOUNTER — Other Ambulatory Visit: Payer: Self-pay

## 2021-04-11 ENCOUNTER — Ambulatory Visit: Payer: No Typology Code available for payment source | Admitting: Physical Therapy

## 2021-04-11 ENCOUNTER — Other Ambulatory Visit: Payer: Self-pay

## 2021-04-11 MED FILL — Metoprolol Succinate Tab ER 24HR 50 MG (Tartrate Equiv): ORAL | 90 days supply | Qty: 90 | Fill #3 | Status: AC

## 2021-04-16 ENCOUNTER — Other Ambulatory Visit (HOSPITAL_COMMUNITY): Payer: Self-pay

## 2021-04-18 ENCOUNTER — Ambulatory Visit: Payer: No Typology Code available for payment source | Admitting: Physical Therapy

## 2021-04-19 ENCOUNTER — Ambulatory Visit: Payer: No Typology Code available for payment source | Attending: Surgery | Admitting: Physical Therapy

## 2021-04-19 DIAGNOSIS — R269 Unspecified abnormalities of gait and mobility: Secondary | ICD-10-CM

## 2021-04-19 DIAGNOSIS — M25562 Pain in left knee: Secondary | ICD-10-CM

## 2021-04-19 DIAGNOSIS — Z96652 Presence of left artificial knee joint: Secondary | ICD-10-CM

## 2021-04-19 DIAGNOSIS — M25662 Stiffness of left knee, not elsewhere classified: Secondary | ICD-10-CM

## 2021-04-19 DIAGNOSIS — M6281 Muscle weakness (generalized): Secondary | ICD-10-CM

## 2021-04-24 ENCOUNTER — Other Ambulatory Visit: Payer: Self-pay

## 2021-04-25 ENCOUNTER — Encounter: Payer: No Typology Code available for payment source | Admitting: Physical Therapy

## 2021-04-25 ENCOUNTER — Ambulatory Visit: Payer: No Typology Code available for payment source | Admitting: Physical Therapy

## 2021-04-26 ENCOUNTER — Ambulatory Visit: Payer: No Typology Code available for payment source | Admitting: Physical Therapy

## 2021-04-27 NOTE — Therapy (Signed)
[place] ?[facility] ?[address] ?[place], [place], [address] ?Phone: [phone]   Fax:  [phone] ? ?Physical Therapy Treatment ? ?Patient Details  ?Name: Curtis Singh ?MRN: [id number] ?Date of Birth: 03/14/1955 ?Referring Provider (PT): Dr. Poggi ? ? ?Encounter Date: 04/19/2021 ? ? PT End of Session - 04/27/21 1229   ? ? Visit Number 25   ? Number of Visits 29   ? Date for PT Re-Evaluation 04/11/21   ? Authorization - Visit Number 5   ? Authorization - Number of Visits 10   ? PT Start Time 0858   ? PT Stop Time 0919   ? PT Time Calculation (min) 21 min   ? Activity Tolerance Patient limited by pain;Patient tolerated treatment well   ? Behavior During Therapy WFL for tasks assessed/performed   ? ?  ?  ? ?  ? ? ?Past Medical History:  ?Diagnosis Date  ? Aortic atherosclerosis (HCC)   ? Coronary artery disease 12/2019  ? a.) LHC 12/16/2019: EF 55-65%, LVEDP norm. 99% p-mRCA, 20% mRCA. --> PCI placing a 3.0 x 22 mm Resolute Onyx DES to pRCA.  ? ED (erectile dysfunction)   ? GERD (gastroesophageal reflux disease)   ? 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 HLD (hyperlipidemia)   ? Hypertension   ? Long term current use of antithrombotics/antiplatelets   ? a.) DAPT (ASA + ticagrelor)  ? Osteoarthritis   ? Seasonal allergies   ? T2DM (type 2 diabetes mellitus) (HCC)   ? ? ?Past Surgical History:  ?Procedure Laterality Date  ? ARTHOSCOPIC ROTAOR CUFF REPAIR Left 02/25/2018  ? Procedure: LEFT ARTHROSCOPIC ROTATOR CUFF REPAIR; SUBACROMIAL DECOMPRESSION;  Surgeon: Chandler, Justin, MD;  [facility]: [facility];  Service: Orthopedics;  Laterality: Left;  ? CATARACT EXTRACTION W/PHACO Right 09/03/2016  ? Procedure: CATARACT EXTRACTION PHACO AND INTRAOCULAR LENS PLACEMENT ([facility]) RIGHT DIABETIC;  Surgeon: Brasington, Chadwick, MD;  Location: [facility];  Service: Ophthalmology;  Laterality: Right;  DIABETIC  ? COLONOSCOPY WITH PROPOFOL N/A 08/29/2019  ? Procedure: COLONOSCOPY  WITH PROPOFOL;  Surgeon: Vanga, Rohini Reddy, MD;  Location: [facility];  Service: Gastroenterology;  Laterality: N/A;  ? CORONARY STENT INTERVENTION N/A 12/16/2019  ? Procedure: CORONARY STENT INTERVENTION (3.0 x 22 mm Resolute Onyx DES to pRCA);  Surgeon: Arida, Muhammad A, MD;  [facility];  Service: Cardiovascular;  Laterality: N/A;  ? KNEE CLOSED REDUCTION Left 03/06/2021  ? Procedure: Left knee manipulation and steroid injection;  Surgeon: Poggi, John J, MD;  Location: [facility];  Service: Orthopedics;  Laterality: Left;  ? LEFT HEART CATH AND CORONARY ANGIOGRAPHY N/A 12/16/2019  ? Procedure: LEFT HEART CATH AND CORONARY ANGIOGRAPHY;  Surgeon: Arida, Muhammad A, MD;  [facility]: [facility];  Service: Cardiovascular;  Laterality: N/A;  ? ROTATOR CUFF REPAIR Right 01/07/2007  ? TOTAL KNEE ARTHROPLASTY Left 12/13/2020  ? Procedure: TOTAL KNEE ARTHROPLASTY;  Surgeon: Poggi, John J, MD;  Location: [facility];  Service: Orthopedics;  Laterality: Left;  ? VASECTOMY N/A   ? ? ?There were no vitals filed for this visit. ? ? Subjective Assessment - 04/27/21 1230   ? ? Subjective MD f/u on 6/5.  Pt. reports good compliance with HEP.   ? 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 Pt. 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  Pt. known well to skilled PT services.  Pt. has good caregiver assist   for daily household tasks/ driving.   ? Limitations Standing;Walking;House hold activities   ? Patient Stated Goals Increase L knee AROM/ strength to improve pain-free mobility and return to work.   ? ?  ?  ? ?  ? ? ? ? ?No treatment session ? ? ? ? ? PT Long Term Goals - 03/17/21 1744   ? ?  ? PT LONG TERM GOAL #1  ? Title Pt. will increase FOTO to 45 to improve pain-free moblity with daily tasks.   ? Baseline FOTO: initial: 12.  1/11: 56.  2/20: 69   ? Time 8   ? Period Weeks   ? Status Achieved   ? Target Date 02/25/21   ?  ? PT LONG TERM GOAL #2  ? Title Pt. will  increase L knee AROM to 0->115 deg. to improve walking/ stairs/ return to work.   ? Baseline R knee AROM: -1 to 128 deg. and strength grossly 5/5 MMT. Pt. pain limited with L knee A/PROM: extensino -12/-10 deg., flexion 64/72 deg. (guarded/ pain limted).  1/11: supine L knee (-11 to 86 deg.),  seated L knee flexion 88 deg.  2/20: -11 to 92 deg. L knee AROM.  3/2: -8 to 90 deg.   ? Time 4   ? Period Weeks   ? Status Partially Met   ? Target Date 04/11/21   ?  ? PT LONG TERM GOAL #3  ? Title Pt. will stand from chair with proper technique/ no UE assist to improve pain-free mobility.   ? Baseline 9/10 L knee pain with sit to stands   ? Time 4   ? Period Weeks   ? Status Partially Met   ? Target Date 04/11/21   ?  ? PT LONG TERM GOAL #4  ? Title Pt. will ambulate community distances with normalized gait pattern/ no assistive device to improve return to work.   ? Baseline Pt. ambulates with limited L LE wt. bearing and heavy UE assist on RW. Limited R LE step length during L LE weight bearing due to pain.   ? Time 4   ? Period Weeks   ? Status Partially Met   ? Target Date 04/11/21   ?  ? PT LONG TERM GOAL #5  ? Title Pt. will ascend/ descend stairs with recip. pattern and no handrail to improve functional mobility.   ? Baseline Heavy UE assist with recip. gait pattern   ? Time 4   ? Period Weeks   ? Status Partially Met   ? Target Date 04/11/21   ?  ? PT LONG TERM GOAL #6  ? Title Pt. will be able to return to work and perform functional activities with <2 increase in NPS.   ? Baseline Pain increases to >6 when performing funcational activites. resting NPS 3/10.   2/20: 4/10 L knee pain reported   ? Time 4   ? Period Weeks   ? Status Partially Met   ? Target Date 04/11/21   ? ?  ?  ? ?  ? ? ? ? ? ? ? ? Plan - 04/27/21 1231   ? ? Clinical Impression Statement No skilled PT tx. session.  Pt. rode Scifit for 10 min. and completed independent stretchs.  No charge from tx.  Pt. remains limited with knee flexion/ extension.   Pt. may benefit from dynamic knee brace (JAS) to increase knee extension.   ? Stability/Clinical Decision Making Evolving/Moderate complexity   ?   Clinical Decision Making Moderate   ? Rehab Potential Good   ? PT Frequency 2x / week   ? PT Duration 4 weeks   ? PT Treatment/Interventions ADLs/Self Care Home Management;Cryotherapy;Electrical Stimulation;Functional mobility training;Therapeutic activities;Therapeutic exercise;Patient/family education;Neuromuscular re-education;Manual techniques;Scar mobilization;Passive range of motion;Stair training;Gait training;DME Instruction;Balance training   ? PT Next Visit Plan Progress L knee ROM.   ? PT Home Exercise Plan Access Code: [email or web address]   ? Consulted and Agree with Plan of Care Patient   ? ?  ?  ? ?  ? ? ?Patient will benefit from skilled therapeutic intervention in order to improve the following deficits and impairments:  Decreased mobility, Decreased strength, Impaired flexibility, Hypomobility, Pain, Abnormal gait, Decreased balance, Difficulty walking, Decreased range of motion, Decreased scar mobility, Decreased activity tolerance ? ?Visit Diagnosis: ?S/P total knee replacement, left ? ?Joint stiffness of knee, left ? ?Gait difficulty ? ?Acute pain of left knee ? ?Muscle weakness (generalized) ? ? ? ? ?Problem List ?Patient Active Problem List  ? Diagnosis Date Noted  ? Complex tear of medial meniscus of left knee as current injury 08/22/2020  ? Primary osteoarthritis of left knee 06/06/2020  ? Left knee pain 03/29/2020  ? Primary insomnia 03/29/2020  ? Encounter for medication titration 02/09/2020  ? Unstable angina (HCC)   ? Chest discomfort 12/12/2019  ? History of colonic polyps   ? Annual physical exam 07/01/2019  ? ED (erectile dysfunction) of organic origin 02/21/2015  ? Diabetes mellitus type 2 with complications, uncontrolled 10/18/2014  ? Hyperlipemia 10/18/2014  ? ?Michael C Sherk, PT, DPT # [id number] ?04/27/2021, 12:33 PM ? ?[place] ?[facility] ?[address] ?[place], [place], [address] ?Phone: [phone]   Fax:  [phone] ? ?Name: Ascher N Beaufort ?MRN: [id number] ?Date of Birth: 09/29/1955 ? ? ? ?

## 2021-05-02 ENCOUNTER — Ambulatory Visit: Payer: No Typology Code available for payment source | Admitting: Physical Therapy

## 2021-05-03 ENCOUNTER — Ambulatory Visit: Payer: No Typology Code available for payment source | Admitting: Physical Therapy

## 2021-05-03 DIAGNOSIS — M25562 Pain in left knee: Secondary | ICD-10-CM

## 2021-05-03 DIAGNOSIS — Z96652 Presence of left artificial knee joint: Secondary | ICD-10-CM

## 2021-05-03 DIAGNOSIS — M6281 Muscle weakness (generalized): Secondary | ICD-10-CM

## 2021-05-03 DIAGNOSIS — R269 Unspecified abnormalities of gait and mobility: Secondary | ICD-10-CM

## 2021-05-03 DIAGNOSIS — M25662 Stiffness of left knee, not elsewhere classified: Secondary | ICD-10-CM

## 2021-05-09 ENCOUNTER — Ambulatory Visit: Payer: No Typology Code available for payment source | Admitting: Physical Therapy

## 2021-05-11 NOTE — Therapy (Signed)
[place] ?[facility] ?[address] [place], [place], [address] ?Phone: [phone]   Fax:  [phone] ? ?Physical Therapy Treatment ? ?Patient Details  ?Name: Curtis Singh ?MRN: [id number] ?Date of Birth: 11/15/1955 ?Referring Provider (PT): Dr. Roland Rack ? ? ?Encounter Date: 05/03/2021 ? ? PT End of Session - 05/11/21 1814   ? ? Visit Number 25   ? Number of Visits 29   ? Date for PT Re-Evaluation 04/11/21   ? Authorization - Visit Number 5   ? Authorization - Number of Visits 10   ? Activity Tolerance Patient limited by pain;Patient tolerated treatment well   ? Behavior During Therapy [facility] for tasks assessed/performed   ? ?  ?  ? ?  ? ? ?Past Medical History:  ?Diagnosis Date  ? Aortic atherosclerosis ([place])   ? Coronary artery disease 12/2019  ? a.) LHC 12/16/2019: EF 55-65%, LVEDP norm. 99% p-mRCA, 20% mRCA. --> PCI placing a 3.0 x 22 mm Resolute Onyx DES to pRCA.  ? ED (erectile dysfunction)   ? GERD (gastroesophageal reflux disease)   ? 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 HLD (hyperlipidemia)   ? Hypertension   ? Long term current use of antithrombotics/antiplatelets   ? a.) DAPT (ASA + ticagrelor)  ? Osteoarthritis   ? Seasonal allergies   ? T2DM (type 2 diabetes mellitus) ([place])   ? ? ?Past Surgical History:  ?Procedure Laterality Date  ? ARTHOSCOPIC ROTAOR CUFF REPAIR Left 02/25/2018  ? Procedure: LEFT ARTHROSCOPIC ROTATOR CUFF REPAIR; SUBACROMIAL DECOMPRESSION;  Surgeon: Tania Ade, MD;  [facility];  Service: Orthopedics;  Laterality: Left;  ? CATARACT EXTRACTION W/PHACO Right 09/03/2016  ? Procedure: CATARACT EXTRACTION PHACO AND INTRAOCULAR LENS PLACEMENT ([place]) RIGHT DIABETIC;  Surgeon: Leandrew Koyanagi, MD;  Location: [place];  Service: Ophthalmology;  Laterality: Right;  DIABETIC  ? COLONOSCOPY WITH PROPOFOL N/A 08/29/2019  ? Procedure: COLONOSCOPY WITH PROPOFOL;  Surgeon: Lin Landsman, MD;  Location: [facility];   Service: Gastroenterology;  Laterality: N/A;  ? CORONARY STENT INTERVENTION N/A 12/16/2019  ? Procedure: CORONARY STENT INTERVENTION (3.0 x 22 mm Resolute Onyx DES to pRCA);  Surgeon: Wellington Hampshire, MD;  Location: [place] [facility];  Service: Cardiovascular;  Laterality: N/A;  ? KNEE CLOSED REDUCTION Left 03/06/2021  ? Procedure: Left knee manipulation and steroid injection;  Surgeon: Corky Mull, MD;  Location: [facility];  Service: Orthopedics;  Laterality: Left;  ? LEFT HEART CATH AND CORONARY ANGIOGRAPHY N/A 12/16/2019  ? Procedure: LEFT HEART CATH AND CORONARY ANGIOGRAPHY;  Surgeon: Wellington Hampshire, MD;  Location: [facility] [facility];  Service: Cardiovascular;  Laterality: N/A;  ? ROTATOR CUFF REPAIR Right 01/07/2007  ? TOTAL KNEE ARTHROPLASTY Left 12/13/2020  ? Procedure: TOTAL KNEE ARTHROPLASTY;  Surgeon: Corky Mull, MD;  Location: [facility];  Service: Orthopedics;  Laterality: Left;  ? VASECTOMY N/A   ? ? ?There were no vitals filed for this visit. ? ? ?Chart opened in error.  Pt. Rode Nustep and used TG on an independent basis.  PT discussed L knee ROM/ HEP.  No charge ? ? ? ? PT Long Term Goals - 03/17/21 1744   ? ?  ? PT LONG TERM GOAL #1  ? Title Pt. will increase FOTO to 45 to improve pain-free moblity with daily tasks.   ? Baseline FOTO: initial: 12.  1/11: 56.  2/20: 69   ? Time 8   ?  Period Weeks   ? Status Achieved   ? Target Date 02/25/21   ?  ? PT LONG TERM GOAL #2  ? Title Pt. will increase L knee AROM to 0->115 deg. to improve walking/ stairs/ return to work.   ? Baseline R knee AROM: -1 to 128 deg. and strength grossly 5/5 MMT. Pt. pain limited with L knee A/PROM: extensino -12/-10 deg., flexion 64/72 deg. (guarded/ pain limted).  1/11: supine L knee (-11 to 86 deg.),  seated L knee flexion 88 deg.  2/20: -11 to 92 deg. L knee AROM.  3/2: -8 to 90 deg.   ? Time 4   ? Period Weeks   ? Status Partially Met   ? Target Date 04/11/21   ?  ? PT LONG TERM GOAL #3  ? Title Pt. will stand from  chair with proper technique/ no UE assist to improve pain-free mobility.   ? Baseline 9/10 L knee pain with sit to stands   ? Time 4   ? Period Weeks   ? Status Partially Met   ? Target Date 04/11/21   ?  ? PT LONG TERM GOAL #4  ? Title Pt. will ambulate community distances with normalized gait pattern/ no assistive device to improve return to work.   ? Baseline Pt. ambulates with limited L LE wt. bearing and heavy UE assist on RW. Limited R LE step length during L LE weight bearing due to pain.   ? Time 4   ? Period Weeks   ? Status Partially Met   ? Target Date 04/11/21   ?  ? PT LONG TERM GOAL #5  ? Title Pt. will ascend/ descend stairs with recip. pattern and no handrail to improve functional mobility.   ? Baseline Heavy UE assist with recip. gait pattern   ? Time 4   ? Period Weeks   ? Status Partially Met   ? Target Date 04/11/21   ?  ? PT LONG TERM GOAL #6  ? Title Pt. will be able to return to work and perform functional activities with <2 increase in NPS.   ? Baseline Pain increases to >6 when performing funcational activites. resting NPS 3/10.   2/20: 4/10 L knee pain reported   ? Time 4   ? Period Weeks   ? Status Partially Met   ? Target Date 04/11/21   ? ?  ?  ? ?  ? ? ? ? ? ? ? ? ? ?Patient will benefit from skilled therapeutic intervention in order to improve the following deficits and impairments:    ? ?Visit Diagnosis: ?S/P total knee replacement, left ? ?Joint stiffness of knee, left ? ?Gait difficulty ? ?Acute pain of left knee ? ?Muscle weakness (generalized) ? ? ? ? ?Problem List ?Patient Active Problem List  ? Diagnosis Date Noted  ? Complex tear of medial meniscus of left knee as current injury 08/22/2020  ? Primary osteoarthritis of left knee 06/06/2020  ? Left knee pain 03/29/2020  ? Primary insomnia 03/29/2020  ? Encounter for medication titration 02/09/2020  ? Unstable angina (HCC)   ? Chest discomfort 12/12/2019  ? History of colonic polyps   ? Annual physical exam 07/01/2019  ? ED  (erectile dysfunction) of organic origin 02/21/2015  ? Diabetes mellitus type 2 with complications, uncontrolled 10/18/2014  ? Hyperlipemia 10/18/2014  ? ?Pura Spice, PT, DPT # [phone] ?05/11/2021, 6:15 PM ? ?[place] ?[place] ?[address] [place], [place], [address] ?Phone: [phone]   Fax:  [phone] ? ?Name: Curtis Singh ?MRN: [id number] ?Date of Birth: 1955-07-01 ? ? ? ?

## 2021-05-14 ENCOUNTER — Other Ambulatory Visit: Payer: Self-pay

## 2021-05-14 ENCOUNTER — Other Ambulatory Visit: Payer: Self-pay | Admitting: Internal Medicine

## 2021-05-14 MED FILL — Semaglutide Soln Pen-inj 0.25 or 0.5 MG/DOSE (2 MG/3ML): SUBCUTANEOUS | 84 days supply | Qty: 9 | Fill #0 | Status: AC

## 2021-05-21 ENCOUNTER — Ambulatory Visit: Payer: No Typology Code available for payment source | Admitting: Physical Therapy

## 2021-05-24 ENCOUNTER — Other Ambulatory Visit: Payer: Self-pay | Admitting: Internal Medicine

## 2021-05-24 MED ORDER — ZOLPIDEM TARTRATE 10 MG PO TABS: ORAL_TABLET | ORAL | 1 refills | Status: DC

## 2021-05-27 ENCOUNTER — Other Ambulatory Visit: Payer: Self-pay

## 2021-05-27 MED ORDER — ZOLPIDEM TARTRATE 10 MG PO TABS
ORAL_TABLET | ORAL | 1 refills | Status: DC
  Filled 2021-05-27: qty 30, 30d supply, fill #0
  Filled 2021-06-24: qty 30, 30d supply, fill #1

## 2021-06-04 ENCOUNTER — Other Ambulatory Visit: Payer: Self-pay

## 2021-06-04 MED FILL — Metformin HCl Tab ER 24HR 500 MG: ORAL | 90 days supply | Qty: 180 | Fill #1 | Status: AC

## 2021-06-10 ENCOUNTER — Other Ambulatory Visit: Payer: Self-pay

## 2021-06-11 ENCOUNTER — Ambulatory Visit: Payer: No Typology Code available for payment source | Admitting: Cardiovascular Disease

## 2021-06-11 ENCOUNTER — Encounter: Payer: Self-pay | Admitting: Cardiovascular Disease

## 2021-06-11 VITALS — BP 120/70 | HR 76 | Ht 70.5 in | Wt 193.0 lb

## 2021-06-11 DIAGNOSIS — I10 Essential (primary) hypertension: Secondary | ICD-10-CM

## 2021-06-11 DIAGNOSIS — E785 Hyperlipidemia, unspecified: Secondary | ICD-10-CM | POA: Diagnosis not present

## 2021-06-11 DIAGNOSIS — E119 Type 2 diabetes mellitus without complications: Secondary | ICD-10-CM | POA: Diagnosis not present

## 2021-06-11 DIAGNOSIS — I2510 Atherosclerotic heart disease of native coronary artery without angina pectoris: Secondary | ICD-10-CM

## 2021-06-11 NOTE — Progress Notes (Signed)
Cardiology Office Note   Date:  06/11/2021   ID:  Curtis Singh, DOB Mar 02, 1955, MRN [id number]  PCP:  P.A., Cletis Athens, MD  Cardiologist:   [name], MD  Chief Complaint  Patient presents with   Other    6 month f/u no complaints today. Meds reviewed verbally with pt.       History of Present Illness: Curtis Singh is a 66 y.o. male who is here today for follow-up visit regarding coronary artery disease.   He has known history of diabetes mellitus on insulin, hyperlipidemia, essential hypertension, aortic atherosclerosis and strong family history of coronary artery disease. He is not a smoker. He was seen in December of 2021 with unstable angina.   Cardiac catheterization showed 99% stenosis in the proximal/mid RCA and no other obstructive disease.  Ejection fraction and LVEDP were both normal.  I performed successful angioplasty and drug-eluting stent placement to the right coronary artery.  He underwent left knee replacement in December.  However, he continued to struggle with range of motion in spite of physical therapy.  Thus, he underwent left knee manipulation with steroid injection in March with some improvement.  He has been doing well from a cardiac standpoint with no chest pain, shortness of breath or palpitations.  Past Medical History:  Diagnosis Date   Aortic atherosclerosis ([place])    Coronary artery disease 12/2019   a.) LHC 12/16/2019: EF 55-65%, LVEDP norm. 99% p-mRCA, 20% mRCA. --> PCI placing a 3.0 x 22 mm Resolute Onyx DES to pRCA.   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    HLD (hyperlipidemia)    Hypertension    Long term current use of antithrombotics/antiplatelets    a.) DAPT (ASA + ticagrelor)   Osteoarthritis    Seasonal allergies    T2DM (type 2 diabetes mellitus) ([place])     Past Surgical History:  Procedure Laterality Date   ARTHOSCOPIC ROTAOR CUFF REPAIR  Left 02/25/2018   Procedure: LEFT ARTHROSCOPIC ROTATOR CUFF REPAIR; SUBACROMIAL DECOMPRESSION;  Surgeon: Tania Ade, MD;  [facility];  Service: Orthopedics;  Laterality: Left;   CATARACT EXTRACTION W/PHACO Right 09/03/2016   Procedure: CATARACT EXTRACTION PHACO AND INTRAOCULAR LENS PLACEMENT ([place]) RIGHT DIABETIC;  Surgeon: Leandrew Koyanagi, MD;  Location: [place];  Service: Ophthalmology;  Laterality: Right;  DIABETIC   COLONOSCOPY WITH PROPOFOL N/A 08/29/2019   Procedure: COLONOSCOPY WITH PROPOFOL;  Surgeon: Lin Landsman, MD;  Location: [facility];  Service: Gastroenterology;  Laterality: N/A;   CORONARY STENT INTERVENTION N/A 12/16/2019   Procedure: CORONARY STENT INTERVENTION (3.0 x 22 mm Resolute Onyx DES to pRCA);  Surgeon: Wellington Hampshire, MD;  Location: [place] [facility];  Service: Cardiovascular;  Laterality: N/A;   KNEE CLOSED REDUCTION Left 03/06/2021   Procedure: Left knee manipulation and steroid injection;  Surgeon: Corky Mull, MD;  Location: [facility];  Service: Orthopedics;  Laterality: Left;   LEFT HEART CATH AND CORONARY ANGIOGRAPHY N/A 12/16/2019   Procedure: LEFT HEART CATH AND CORONARY ANGIOGRAPHY;  Surgeon: Wellington Hampshire, MD;  Location: [place] [facility];  Service: Cardiovascular;  Laterality: N/A;   ROTATOR CUFF REPAIR Right 01/07/2007   TOTAL KNEE ARTHROPLASTY Left 12/13/2020   Procedure: TOTAL KNEE ARTHROPLASTY;  Surgeon: Corky Mull, MD;  Location: [facility];  Service: Orthopedics;  Laterality: Left;   VASECTOMY N/A      Current  Outpatient Medications  Medication Sig Dispense Refill   acetaminophen (TYLENOL) 500 MG tablet Take 1,000 mg by mouth every 6 (six) hours as needed for moderate pain.     aspirin EC 81 MG tablet Take 81 mg by mouth daily. Swallow whole.     Blood Glucose Monitoring Suppl (FREESTYLE FREEDOM LITE) w/Device KIT use as directed 1 kit 0   losartan (COZAAR) 25 MG tablet Take 1 tablet (25 mg total) by  mouth daily in the afternoon.     metFORMIN (GLUCOPHAGE-XR) 500 MG 24 hr tablet TAKE 1 TABLET BY MOUTH TWICE DAILY 180 tablet 3   metoprolol succinate (TOPROL-XL) 50 MG 24 hr tablet TAKE 1 TABLET BY MOUTH DAILY. TAKE WITH OR IMMEDIATELY FOLLOWING A MEAL. (Patient taking differently: Take 50 mg by mouth daily in the afternoon.) 90 tablet 3   Multiple Vitamin (MULTIVITAMIN WITH MINERALS) TABS tablet Take 1 tablet by mouth at bedtime.     omeprazole (PRILOSEC) 20 MG capsule TAKE 1 CAPSULE BY MOUTH DAILY. (Patient taking differently: Take 20 mg by mouth daily in the afternoon.) 90 capsule 3   rosuvastatin (CRESTOR) 40 MG tablet TAKE 1 TABLET (40 MG TOTAL) BY MOUTH AT BEDTIME. 90 tablet 0   Semaglutide,0.25 or 0.5MG/DOS, (OZEMPIC, 0.25 OR 0.5 MG/DOSE,) 2 MG/3ML SOPN Inject 0.5 mg into the skin once a week. 3 mL 2   sildenafil (VIAGRA) 100 MG tablet Take 1 tablet (100 mg total) by mouth daily as needed for erectile dysfunction. 30 tablet 0   tadalafil (CIALIS) 5 MG tablet Take 1 tablet (5 mg total) by mouth daily as needed for erectile dysfunction. 30 tablet 6   testosterone cypionate (DEPOTESTOSTERONE CYPIONATE) 200 MG/ML injection Inject 1 mL (200 mg total) into the muscle every 14 (fourteen) days. 10 mL 0   UNIFINE PENTIPS 29G X 12MM MISC Use to inject insulin daily 30 each 6   zolpidem (AMBIEN) 10 MG tablet Take 1 tablet (10 mg total) by mouth at bedtime.     HYDROcodone-acetaminophen (NORCO) 5-325 MG tablet Take 1-2 tablets by mouth every 6 (six) hours as needed for moderate pain or severe pain. MAXIMUM TOTAL ACETAMINOPHEN DOSE IS 4000 MG PER DAY (Patient not taking: Reported on 06/11/2021) 40 tablet 0   HYDROcodone-acetaminophen (NORCO/VICODIN) 5-325 MG tablet Take 1 tablet by mouth every 8 (eight) hours as needed for Pain (Patient not taking: Reported on 06/11/2021) 21 tablet 0   zolpidem (AMBIEN) 10 MG tablet Take 1 tablet by mouth at bedtime as needed for sleep (Patient not taking: Reported on 06/11/2021)  30 tablet 1   zolpidem (AMBIEN) 10 MG tablet Take 1 tablet by mouth at bedtime as needed for sleep (Patient not taking: Reported on 06/11/2021) 30 tablet 1   No current facility-administered medications for this visit.    Allergies:   Latex    Social History:  The patient  reports that he quit smoking about 39 years ago. His smoking use included cigarettes. He has never used smokeless tobacco. He reports current alcohol use of about 24.0 standard drinks per week. He reports that he does not use drugs.   Family History:  The patient's family history includes Heart attack in his father, maternal uncle, maternal uncle, maternal uncle, and maternal uncle.    ROS:  Please see the history of present illness.   Otherwise, review of systems are positive for none.   All other systems are reviewed and negative.    PHYSICAL EXAM: VS:  BP 120/70 (BP Location: Left  Arm, Patient Position: Sitting, Cuff Size: Normal)   Pulse 76   Ht 5' 10.5" (1.791 m)   Wt 193 lb (87.5 kg)   SpO2 98%   BMI 27.30 kg/m  , BMI Body mass index is 27.3 kg/m. GEN: Well nourished, well developed, in no acute distress  HEENT: normal  Neck: no JVD, carotid bruits, or masses Cardiac: RRR; no  rubs, or gallops,no edema .  1/ 6 systolic murmur in the aortic area Respiratory:  clear to auscultation bilaterally, normal work of breathing GI: soft, nontender, nondistended, + BS MS: no deformity or atrophy  Skin: warm and dry, no rash Neuro:  Strength and sensation are intact Psych: euthymic mood, full affect Radial pulse is normal.  EKG:  EKG  ordered today. EKG showed normal sinus rhythm with nonspecific T wave changes.  Low voltage.   Recent Labs: 12/05/2020: ALT 31; BUN 13; Creatinine, Ser 0.77; Hemoglobin 15.1; Platelets 286; Potassium 3.7; Sodium 135    Lipid Panel    Component Value Date/Time   CHOL 182 07/04/2019 0823   CHOL 239 (H) 01/20/2014 0729   TRIG 233 (H) 07/04/2019 0823   TRIG 171 01/20/2014 0729    HDL 69 07/04/2019 0823   HDL 48 01/20/2014 0729   CHOLHDL 2.6 07/04/2019 0823   VLDL 47 (H) 07/04/2019 0823   VLDL 34 01/20/2014 0729   LDLCALC 66 07/04/2019 0823   LDLCALC 157 (H) 01/20/2014 0729      Wt Readings from Last 3 Encounters:  06/11/21 193 lb (87.5 kg)  03/06/21 188 lb (85.3 kg)  12/13/20 200 lb (90.7 kg)        ASSESSMENT AND PLAN:  1.  Coronary artery disease involving native coronary arteries without angina: He is doing very well at the present time with no anginal symptoms.  Continue aspirin indefinitely.  2.  Hyperlipidemia: Continue rosuvastatin 40 mg daily.  Most recent lipid profile showed an LDL of 66.  He is going to see his primary care physician for a physical.  3.  Essential hypertension: Blood pressure is well controlled on current medications.  4.  Diabetes mellitus: Reports some side effects with metformin but he is doing well with Ozempic.  He will see Dr. Lavera Guise in the near future.    Disposition:   FU with me in 12 months  Signed, [name], MD 06/11/2021 4:39 PM    [place]

## 2021-06-11 NOTE — Patient Instructions (Signed)
Medication Instructions:  Your physician recommends that you continue on your current medications as directed. Please refer to the Current Medication list given to you today.  *If you need a refill on your cardiac medications before your next appointment, please call your pharmacy*   Lab Work: None ordered If you have labs (blood work) drawn today and your tests are completely normal, you will receive your results only by: [place] (if you have MyChart) OR A paper copy in the mail If you have any lab test that is abnormal or we need to change your treatment, we will call you to review the results.   Testing/Procedures: None ordered   Follow-Up: At [facility], you and your health needs are our priority.  As part of our continuing mission to provide you with exceptional heart care, we have created designated Provider Care Teams.  These Care Teams include your primary Cardiologist (physician) and Advanced Practice Providers (APPs -  Physician Assistants and Nurse Practitioners) who all work together to provide you with the care you need, when you need it.  We recommend signing up for the patient portal called "MyChart".  Sign up information is provided on this After Visit Summary.  MyChart is used to connect with patients for Virtual Visits (Telemedicine).  Patients are able to view lab/test results, encounter notes, upcoming appointments, etc.  Non-urgent messages can be sent to your provider as well.   To learn more about what you can do with MyChart, go to [email or web address].    Your next appointment:   1 year(s)  The format for your next appointment:   In Person  Provider:   You may see [name], MD or one of the following Advanced Practice Providers on your designated Care Team:   Murray Hodgkins, NP Christell Faith, PA-C Cadence Kathlen Mody, [place]    Other Instructions   Important Information About Sugar

## 2021-06-24 ENCOUNTER — Other Ambulatory Visit: Payer: Self-pay | Admitting: Internal Medicine

## 2021-06-24 ENCOUNTER — Other Ambulatory Visit: Payer: Self-pay | Admitting: Cardiovascular Disease

## 2021-06-25 ENCOUNTER — Other Ambulatory Visit: Payer: Self-pay

## 2021-06-25 MED ORDER — ROSUVASTATIN CALCIUM 40 MG PO TABS
ORAL_TABLET | Freq: Every day | ORAL | 2 refills | Status: DC
  Filled 2021-06-25: qty 90, 90d supply, fill #0
  Filled 2021-09-16: qty 90, 90d supply, fill #1
  Filled 2021-12-18: qty 90, 90d supply, fill #2

## 2021-06-25 MED ORDER — OMEPRAZOLE 20 MG PO CPDR
DELAYED_RELEASE_CAPSULE | Freq: Every day | ORAL | 3 refills | Status: AC
  Filled 2021-06-25: qty 90, 90d supply, fill #0
  Filled 2021-09-16: qty 90, 90d supply, fill #1
  Filled 2021-12-18: qty 90, 90d supply, fill #2
  Filled 2022-03-23: qty 90, 90d supply, fill #3

## 2021-07-01 ENCOUNTER — Encounter: Payer: No Typology Code available for payment source | Admitting: Internal Medicine

## 2021-07-15 ENCOUNTER — Other Ambulatory Visit: Payer: Self-pay | Admitting: Internal Medicine

## 2021-07-16 ENCOUNTER — Other Ambulatory Visit: Payer: Self-pay | Admitting: Internal Medicine

## 2021-07-16 ENCOUNTER — Other Ambulatory Visit: Payer: Self-pay

## 2021-07-16 MED FILL — Metoprolol Succinate Tab ER 24HR 50 MG (Tartrate Equiv): ORAL | 90 days supply | Qty: 90 | Fill #0 | Status: AC

## 2021-07-25 ENCOUNTER — Other Ambulatory Visit: Payer: Self-pay | Admitting: Internal Medicine

## 2021-07-25 ENCOUNTER — Other Ambulatory Visit: Payer: Self-pay

## 2021-07-26 ENCOUNTER — Other Ambulatory Visit: Payer: Self-pay

## 2021-07-29 ENCOUNTER — Other Ambulatory Visit: Payer: Self-pay

## 2021-07-29 ENCOUNTER — Other Ambulatory Visit: Payer: Self-pay | Admitting: Internal Medicine

## 2021-07-29 MED ORDER — ZOLPIDEM TARTRATE 10 MG PO TABS
ORAL_TABLET | ORAL | 1 refills | Status: DC
  Filled 2021-07-29: qty 30, 30d supply, fill #0
  Filled 2021-08-28: qty 30, 30d supply, fill #1

## 2021-07-29 MED ORDER — OZEMPIC (0.25 OR 0.5 MG/DOSE) 2 MG/3ML ~~LOC~~ SOPN
0.5000 mg | PEN_INJECTOR | SUBCUTANEOUS | 2 refills | Status: DC
  Filled 2021-07-29: qty 3, 28d supply, fill #0
  Filled 2021-08-23: qty 3, 28d supply, fill #1
  Filled 2021-09-26: qty 3, 28d supply, fill #2

## 2021-07-31 ENCOUNTER — Other Ambulatory Visit: Payer: Self-pay

## 2021-07-31 MED ORDER — TRIAMCINOLONE ACETONIDE 0.1 % EX CREA
TOPICAL_CREAM | CUTANEOUS | 0 refills | Status: DC
  Filled 2021-07-31: qty 30, 10d supply, fill #0

## 2021-07-31 MED ORDER — METHYLPREDNISOLONE 4 MG PO TBPK
ORAL_TABLET | ORAL | 0 refills | Status: DC
  Filled 2021-07-31: qty 21, 6d supply, fill #0

## 2021-08-01 ENCOUNTER — Other Ambulatory Visit: Payer: Self-pay

## 2021-08-06 ENCOUNTER — Encounter: Payer: No Typology Code available for payment source | Admitting: Internal Medicine

## 2021-08-14 ENCOUNTER — Other Ambulatory Visit: Payer: Self-pay

## 2021-08-23 ENCOUNTER — Other Ambulatory Visit: Payer: Self-pay

## 2021-08-29 ENCOUNTER — Other Ambulatory Visit: Payer: Self-pay

## 2021-08-29 ENCOUNTER — Other Ambulatory Visit: Payer: Self-pay | Admitting: Cardiovascular Disease

## 2021-08-29 DIAGNOSIS — I10 Essential (primary) hypertension: Secondary | ICD-10-CM

## 2021-08-29 MED ORDER — LOSARTAN POTASSIUM 25 MG PO TABS
ORAL_TABLET | Freq: Every day | ORAL | 3 refills | Status: DC
  Filled 2021-08-29: qty 90, 90d supply, fill #0
  Filled 2021-11-25: qty 90, 90d supply, fill #1
  Filled 2022-03-10: qty 90, 90d supply, fill #2
  Filled 2022-06-19: qty 90, 90d supply, fill #3

## 2021-09-03 ENCOUNTER — Encounter: Payer: Self-pay | Admitting: Internal Medicine

## 2021-09-03 ENCOUNTER — Ambulatory Visit (INDEPENDENT_AMBULATORY_CARE_PROVIDER_SITE_OTHER): Payer: No Typology Code available for payment source | Admitting: Internal Medicine

## 2021-09-03 VITALS — BP 133/77 | HR 82 | Temp 98.1°F | Resp 16 | Ht 70.5 in | Wt 195.2 lb

## 2021-09-03 DIAGNOSIS — Z86010 Personal history of colonic polyps: Secondary | ICD-10-CM | POA: Diagnosis not present

## 2021-09-03 DIAGNOSIS — M1712 Unilateral primary osteoarthritis, left knee: Secondary | ICD-10-CM

## 2021-09-03 DIAGNOSIS — Z0000 Encounter for general adult medical examination without abnormal findings: Secondary | ICD-10-CM

## 2021-09-03 DIAGNOSIS — E782 Mixed hyperlipidemia: Secondary | ICD-10-CM | POA: Diagnosis not present

## 2021-09-03 NOTE — Progress Notes (Signed)
Established Patient Office Visit  Subjective:  Patient ID: Curtis Singh, male    DOB: Curtis 27, 1957  Age: 66 y.o. MRN: [id number]  CC:  Chief Complaint  Patient presents with   Annual Exam    Pt notes forgot metformin for about 1 week a while ago and felt his energy was up during that week and wondered if he should continue the metformin     HPI  Curtis Singh presents for physical / pt c/o stiffness of lt knee  Past Medical History:  Diagnosis Date   Aortic atherosclerosis ([place])    Coronary artery disease 12/2019   a.) LHC 12/16/2019: EF 55-65%, LVEDP norm. 99% p-mRCA, 20% mRCA. --> PCI placing a 3.0 x 22 mm Resolute Onyx DES to pRCA.   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    HLD (hyperlipidemia)    Hypertension    Long term current use of antithrombotics/antiplatelets    a.) DAPT (ASA + ticagrelor)   Osteoarthritis    Seasonal allergies    T2DM (type 2 diabetes mellitus) ([place])     Past Surgical History:  Procedure Laterality Date   ARTHOSCOPIC ROTAOR CUFF REPAIR Left 02/25/2018   Procedure: LEFT ARTHROSCOPIC ROTATOR CUFF REPAIR; SUBACROMIAL DECOMPRESSION;  Surgeon: Tania Ade, MD;  Location: [facility];  Service: Orthopedics;  Laterality: Left;   CATARACT EXTRACTION W/PHACO Right 09/03/2016   Procedure: CATARACT EXTRACTION PHACO AND INTRAOCULAR LENS PLACEMENT ([place]) RIGHT DIABETIC;  Surgeon: Leandrew Koyanagi, MD;  Location: [place];  Service: Ophthalmology;  Laterality: Right;  DIABETIC   COLONOSCOPY WITH PROPOFOL N/A 08/29/2019   Procedure: COLONOSCOPY WITH PROPOFOL;  Surgeon: Lin Landsman, MD;  Location: [facility];  Service: Gastroenterology;  Laterality: N/A;   CORONARY STENT INTERVENTION N/A 12/16/2019   Procedure: CORONARY STENT INTERVENTION (3.0 x 22 mm Resolute Onyx DES to pRCA);  Surgeon: Wellington Hampshire, MD;  Location: [place] [facility];  Service:  Cardiovascular;  Laterality: N/A;   KNEE CLOSED REDUCTION Left 03/06/2021   Procedure: Left knee manipulation and steroid injection;  Surgeon: Corky Mull, MD;  Location: [facility];  Service: Orthopedics;  Laterality: Left;   LEFT HEART CATH AND CORONARY ANGIOGRAPHY N/A 12/16/2019   Procedure: LEFT HEART CATH AND CORONARY ANGIOGRAPHY;  Surgeon: Wellington Hampshire, MD;  Location: [place] [facility];  Service: Cardiovascular;  Laterality: N/A;   ROTATOR CUFF REPAIR Right 01/07/2007   TOTAL KNEE ARTHROPLASTY Left 12/13/2020   Procedure: TOTAL KNEE ARTHROPLASTY;  Surgeon: Corky Mull, MD;  Location: [facility];  Service: Orthopedics;  Laterality: Left;   VASECTOMY N/A     Family History  Problem Relation Age of Onset   Heart attack Father    Heart attack Maternal Uncle    Heart attack Maternal Uncle    Heart attack Maternal Uncle    Heart attack Maternal Uncle    Colon cancer Neg Hx    Esophageal cancer Neg Hx    Rectal cancer Neg Hx    Stomach cancer Neg Hx    Kidney cancer Neg Hx    Kidney disease Neg Hx    Prostate cancer Neg Hx    Urolithiasis Neg Hx     Social History   Socioeconomic History   Marital status: Divorced    Spouse name: Not on file   Number of children: Not on file   Years of education: Not on file  Highest education level: Not on file  Occupational History   Not on file  Tobacco Use   Smoking status: Former    Types: Cigarettes    Quit date: 01/06/1982    Years since quitting: 39.6   Smokeless tobacco: Never  Vaping Use   Vaping Use: Never used  Substance and Sexual Activity   Alcohol use: Yes    Alcohol/week: 24.0 standard drinks of alcohol    Types: 24 Cans of beer per week    Comment: 1 case on weekend   Drug use: No   Sexual activity: Not on file  Other Topics Concern   Not on file  Social History Narrative   Not on file   Social Determinants of Health   Financial Resource Strain: Not on file  Food Insecurity: Not on file  Transportation  Needs: Not on file  Physical Activity: Not on file  Stress: Not on file  Social Connections: Not on file  Intimate Partner Violence: Not on file     Current Outpatient Medications:    aspirin EC 81 MG tablet, Take 81 mg by mouth daily. Swallow whole., Disp: , Rfl:    Blood Glucose Monitoring Suppl (FREESTYLE FREEDOM LITE) w/Device KIT, use as directed, Disp: 1 kit, Rfl: 0   losartan (COZAAR) 25 MG tablet, Take 1 tablet (25 mg total) by mouth daily in the afternoon., Disp: , Rfl:    metFORMIN (GLUCOPHAGE-XR) 500 MG 24 hr tablet, TAKE 1 TABLET BY MOUTH TWICE DAILY, Disp: 180 tablet, Rfl: 3   metoprolol succinate (TOPROL-XL) 50 MG 24 hr tablet, TAKE 1 TABLET BY MOUTH DAILY. TAKE WITH OR IMMEDIATELY FOLLOWING A MEAL., Disp: 90 tablet, Rfl: 3   Multiple Vitamin (MULTIVITAMIN WITH MINERALS) TABS tablet, Take 1 tablet by mouth at bedtime., Disp: , Rfl:    omeprazole (PRILOSEC) 20 MG capsule, TAKE 1 CAPSULE BY MOUTH DAILY., Disp: 90 capsule, Rfl: 3   rosuvastatin (CRESTOR) 40 MG tablet, TAKE 1 TABLET (40 MG TOTAL) BY MOUTH AT BEDTIME., Disp: 90 tablet, Rfl: 2   Semaglutide,0.25 or 0.5MG/DOS, (OZEMPIC, 0.25 OR 0.5 MG/DOSE,) 2 MG/3ML SOPN, Inject 0.5 mg into the skin once a week., Disp: 3 mL, Rfl: 2   sildenafil (VIAGRA) 100 MG tablet, Take 1 tablet (100 mg total) by mouth daily as needed for erectile dysfunction., Disp: 30 tablet, Rfl: 0   tadalafil (CIALIS) 5 MG tablet, Take 1 tablet (5 mg total) by mouth daily as needed for erectile dysfunction., Disp: 30 tablet, Rfl: 6   UNIFINE PENTIPS 29G X 12MM MISC, Use to inject insulin daily, Disp: 30 each, Rfl: 6   zolpidem (AMBIEN) 10 MG tablet, Take 1 tablet (10 mg total) by mouth at bedtime., Disp: , Rfl:    acetaminophen (TYLENOL) 500 MG tablet, Take 1,000 mg by mouth every 6 (six) hours as needed for moderate pain., Disp: , Rfl:    HYDROcodone-acetaminophen (NORCO) 5-325 MG tablet, Take 1-2 tablets by mouth every 6 (six) hours as needed for moderate pain  or severe pain. MAXIMUM TOTAL ACETAMINOPHEN DOSE IS 4000 MG PER DAY (Patient not taking: Reported on 06/11/2021), Disp: 40 tablet, Rfl: 0   HYDROcodone-acetaminophen (NORCO/VICODIN) 5-325 MG tablet, Take 1 tablet by mouth every 8 (eight) hours as needed for Pain (Patient not taking: Reported on 06/11/2021), Disp: 21 tablet, Rfl: 0   losartan (COZAAR) 25 MG tablet, TAKE 1 TABLET BY MOUTH DAILY. (Patient not taking: Reported on 09/03/2021), Disp: 90 tablet, Rfl: 3   methylPREDNISolone (MEDROL DOSEPAK) 4 MG  TBPK tablet, take as directed over 6 days (Patient not taking: Reported on 09/03/2021), Disp: 21 tablet, Rfl: 0   testosterone cypionate (DEPOTESTOSTERONE CYPIONATE) 200 MG/ML injection, Inject 1 mL (200 mg total) into the muscle every 14 (fourteen) days. (Patient not taking: Reported on 09/03/2021), Disp: 10 mL, Rfl: 0   triamcinolone cream (KENALOG) 0.1 %, Apply to affected area on legs twice a day (Patient not taking: Reported on 09/03/2021), Disp: 30 g, Rfl: 0   zolpidem (AMBIEN) 10 MG tablet, Take 1 tablet by mouth at bedtime as needed for sleep (Patient not taking: Reported on 06/11/2021), Disp: 30 tablet, Rfl: 1   zolpidem (AMBIEN) 10 MG tablet, Take 1 tablet by mouth at bedtime as needed for sleep (Patient not taking: Reported on 09/03/2021), Disp: 30 tablet, Rfl: 1   zolpidem (AMBIEN) 10 MG tablet, TAKE 1 TABLET BY MOUTH AT BEDTIME AS NEEDED FOR SLEEP (Patient not taking: Reported on 09/03/2021), Disp: 30 tablet, Rfl: 1   Allergies  Allergen Reactions   Latex Rash    ROS Review of Systems  Constitutional: Negative.   HENT: Negative.    Eyes: Negative.   Respiratory: Negative.    Cardiovascular: Negative.   Gastrointestinal: Negative.   Endocrine: Negative.   Genitourinary: Negative.   Musculoskeletal: Negative.   Skin: Negative.   Allergic/Immunologic: Negative.   Neurological: Negative.   Hematological: Negative.   Psychiatric/Behavioral: Negative.    All other systems reviewed and are  negative.     Objective:    Physical Exam Vitals reviewed.  Constitutional:      Appearance: Normal appearance.  HENT:     Mouth/Throat:     Mouth: Mucous membranes are moist.  Eyes:     Pupils: Pupils are equal, round, and reactive to light.  Neck:     Vascular: No carotid bruit.  Cardiovascular:     Rate and Rhythm: Normal rate and regular rhythm.     Pulses: Normal pulses.     Heart sounds: Normal heart sounds.  Pulmonary:     Effort: Pulmonary effort is normal.     Breath sounds: Normal breath sounds.  Abdominal:     General: Bowel sounds are normal.     Palpations: Abdomen is soft. There is no hepatomegaly, splenomegaly or mass.     Tenderness: There is no abdominal tenderness.     Hernia: No hernia is present.  Genitourinary:    Prostate: Normal.     Rectum: Normal.  Musculoskeletal:     Cervical back: Neck supple.     Right lower leg: No edema.     Left lower leg: No edema.  Skin:    Findings: No rash.  Neurological:     Mental Status: He is alert and oriented to person, place, and time.     Motor: No weakness.  Psychiatric:        Mood and Affect: Mood normal.        Behavior: Behavior normal.     BP 133/77   Pulse 82   Temp 98.1 F (36.7 C) (Temporal)   Resp 16   Ht 5' 10.5" (1.791 m)   Wt 195 lb 3.2 oz (88.5 kg)   SpO2 97%   BMI 27.61 kg/m  Wt Readings from Last 3 Encounters:  09/03/21 195 lb 3.2 oz (88.5 kg)  06/11/21 193 lb (87.5 kg)  03/06/21 188 lb (85.3 kg)     Health Maintenance Due  Topic Date Due   COVID-19 Vaccine (1) Never done  FOOT EXAM  Never done   HIV Screening  Never done   Hepatitis C Screening  Never done   TETANUS/TDAP  Never done   Zoster Vaccines- Shingrix (1 of 2) Never done   HEMOGLOBIN A1C  06/04/2021   INFLUENZA VACCINE  08/06/2021    There are no preventive care reminders to display for this patient.  Lab Results  Component Value Date   TSH 1.311 07/04/2019   Lab Results  Component Value Date    WBC 7.2 12/05/2020   HGB 15.1 12/05/2020   HCT 45.4 12/05/2020   MCV 92.5 12/05/2020   PLT 286 12/05/2020   Lab Results  Component Value Date   NA 135 12/05/2020   K 3.7 12/05/2020   CO2 25 12/05/2020   GLUCOSE 283 (H) 12/05/2020   BUN 13 12/05/2020   CREATININE 0.77 12/05/2020   BILITOT 0.9 12/05/2020   ALKPHOS 60 12/05/2020   AST 23 12/05/2020   ALT 31 12/05/2020   PROT 6.9 12/05/2020   ALBUMIN 4.5 12/05/2020   CALCIUM 9.2 12/05/2020   ANIONGAP 6 12/05/2020   Lab Results  Component Value Date   CHOL 182 07/04/2019   Lab Results  Component Value Date   HDL 69 07/04/2019   Lab Results  Component Value Date   LDLCALC 66 07/04/2019   Lab Results  Component Value Date   TRIG 233 (H) 07/04/2019   Lab Results  Component Value Date   CHOLHDL 2.6 07/04/2019   Lab Results  Component Value Date   HGBA1C 10.1 (H) 12/05/2020      Assessment & Plan:   Problem List Items Addressed This Visit       Musculoskeletal and Integument   Primary osteoarthritis of left knee - Primary    Patient is status post left knee replacement        Other   Hyperlipemia   Annual physical exam    Patient denies any chest pain shortness of breath there is no bruit in the neck.  There is no guarding.  Heart is regular chest is clear abdomen is soft nontender without any hepatosplenomegaly there is no pedal edema left knee is swollen.      Relevant Orders   CBC with Differential/Platelet   Lipid panel   Comprehensive metabolic panel   TSH   PSA   Hemoglobin A1c   History of colonic polyps    No orders of the defined types were placed in this encounter.   Follow-up: No follow-ups on file.    Cletis Athens, MD

## 2021-09-03 NOTE — Assessment & Plan Note (Signed)
Patient denies any chest pain shortness of breath there is no bruit in the neck.  There is no guarding.  Heart is regular chest is clear abdomen is soft nontender without any hepatosplenomegaly there is no pedal edema left knee is swollen.

## 2021-09-03 NOTE — Assessment & Plan Note (Signed)
Patient is status post left knee replacement

## 2021-09-04 ENCOUNTER — Other Ambulatory Visit
Admission: RE | Admit: 2021-09-04 | Discharge: 2021-09-04 | Disposition: A | Discharge status: Home / Self Care | Payer: No Typology Code available for payment source | Attending: Internal Medicine | Admitting: Internal Medicine

## 2021-09-04 DIAGNOSIS — Z0000 Encounter for general adult medical examination without abnormal findings: Secondary | ICD-10-CM | POA: Diagnosis present

## 2021-09-04 LAB — COMPREHENSIVE METABOLIC PANEL
ALT: 34 U/L (ref 0–44)
AST: 21 U/L (ref 15–41)
Albumin: 4.4 g/dL (ref 3.5–5.0)
Alkaline Phosphatase: 64 U/L (ref 38–126)
Anion gap: 8 (ref 5–15)
BUN: 20 mg/dL (ref 8–23)
CO2: 23 mmol/L (ref 22–32)
Calcium: 9.1 mg/dL (ref 8.9–10.3)
Chloride: 104 mmol/L (ref 98–111)
Creatinine, Ser: 0.84 mg/dL (ref 0.61–1.24)
GFR, Estimated: >60 mL/min (ref >60)
Glucose, Bld: 244 mg/dL — ABNORMAL HIGH (ref 70–99)
Potassium: 4.1 mmol/L (ref 3.5–5.1)
Sodium: 135 mmol/L (ref 135–145)
Total Bilirubin: 0.9 mg/dL (ref 0.3–1.2)
Total Protein: 7.2 g/dL (ref 6.5–8.1)

## 2021-09-04 LAB — TSH: TSH: 1.319 u[IU]/mL (ref 0.350–4.500)

## 2021-09-04 LAB — CBC WITH DIFFERENTIAL/PLATELET
Abs Immature Granulocytes: 0.08 10*3/uL — ABNORMAL HIGH (ref 0.00–0.07)
Basophils Absolute: 0.1 10*3/uL (ref 0.0–0.1)
Basophils Relative: 1 %
Eosinophils Absolute: 0.2 10*3/uL (ref 0.0–0.5)
Eosinophils Relative: 3 %
HCT: 45.6 % (ref 39.0–52.0)
Hemoglobin: 15.4 g/dL (ref 13.0–17.0)
Immature Granulocytes: 1 %
Lymphocytes Relative: 33 %
Lymphs Abs: 2.1 10*3/uL (ref 0.7–4.0)
MCH: 31 pg (ref 26.0–34.0)
MCHC: 33.8 g/dL (ref 30.0–36.0)
MCV: 91.9 fL (ref 80.0–100.0)
Monocytes Absolute: 0.5 10*3/uL (ref 0.1–1.0)
Monocytes Relative: 8 %
Neutro Abs: 3.3 10*3/uL (ref 1.7–7.7)
Neutrophils Relative %: 54 %
Platelets: 272 10*3/uL (ref 150–400)
RBC: 4.96 MIL/uL (ref 4.22–5.81)
RDW: 12.5 % (ref 11.5–15.5)
WBC: 6.3 10*3/uL (ref 4.0–10.5)
nRBC: 0 % (ref 0.0–0.2)

## 2021-09-04 LAB — LIPID PANEL
Cholesterol: 114 mg/dL (ref 0–200)
HDL: 51 mg/dL (ref >40)
LDL Cholesterol: 39 mg/dL (ref 0–99)
Total CHOL/HDL Ratio: 2.2 RATIO
Triglycerides: 118 mg/dL (ref <150)
VLDL: 24 mg/dL (ref 0–40)

## 2021-09-04 LAB — HEMOGLOBIN A1C
Hgb A1c MFr Bld: 9.5 % — ABNORMAL HIGH (ref 4.8–5.6)
Mean Plasma Glucose: 225.95 mg/dL

## 2021-09-04 LAB — PSA: Prostatic Specific Antigen: 0.24 ng/mL (ref 0.00–4.00)

## 2021-09-04 NOTE — Addendum Note (Signed)
Addended by: Milbert Coulter on: 09/04/2021 07:14 AM   Modules accepted: Orders

## 2021-09-04 NOTE — Addendum Note (Signed)
Addended by: Milbert Coulter on: 09/04/2021 07:15 AM   Modules accepted: Orders

## 2021-09-16 ENCOUNTER — Other Ambulatory Visit: Payer: Self-pay

## 2021-09-16 MED FILL — Metformin HCl Tab ER 24HR 500 MG: ORAL | 90 days supply | Qty: 180 | Fill #2 | Status: AC

## 2021-09-26 ENCOUNTER — Other Ambulatory Visit: Payer: Self-pay

## 2021-09-26 ENCOUNTER — Other Ambulatory Visit: Payer: Self-pay | Admitting: Internal Medicine

## 2021-09-26 ENCOUNTER — Other Ambulatory Visit: Payer: Self-pay | Admitting: *Deleted

## 2021-09-26 MED ORDER — OZEMPIC (0.25 OR 0.5 MG/DOSE) 2 MG/3ML ~~LOC~~ SOPN
0.5000 mg | PEN_INJECTOR | SUBCUTANEOUS | 2 refills | Status: DC
  Filled 2021-09-26: qty 3, 28d supply, fill #0
  Filled 2021-09-26: qty 9, 84d supply, fill #0

## 2021-09-26 MED ORDER — ZOLPIDEM TARTRATE 10 MG PO TABS: ORAL_TABLET | ORAL | 1 refills | Status: DC

## 2021-09-27 ENCOUNTER — Other Ambulatory Visit: Payer: Self-pay | Admitting: Nurse Practitioner

## 2021-09-27 ENCOUNTER — Other Ambulatory Visit: Payer: Self-pay

## 2021-09-27 MED ORDER — ZOLPIDEM TARTRATE 10 MG PO TABS
10.0000 mg | ORAL_TABLET | Freq: Every day | ORAL | 0 refills | Status: DC
  Filled 2021-09-27: qty 30, 30d supply, fill #0

## 2021-09-27 MED ORDER — ZOLPIDEM TARTRATE 10 MG PO TABS: 10.0000 mg | ORAL_TABLET | Freq: Every day | ORAL | Status: DC

## 2021-10-16 ENCOUNTER — Other Ambulatory Visit: Payer: Self-pay

## 2021-10-16 ENCOUNTER — Other Ambulatory Visit: Payer: Self-pay | Admitting: Urology

## 2021-10-16 MED ORDER — TADALAFIL 5 MG PO TABS
5.0000 mg | ORAL_TABLET | Freq: Every day | ORAL | 0 refills | Status: DC | PRN
  Filled 2021-10-16: qty 30, 30d supply, fill #0

## 2021-10-16 MED FILL — Metoprolol Succinate Tab ER 24HR 50 MG (Tartrate Equiv): ORAL | 90 days supply | Qty: 90 | Fill #1 | Status: AC

## 2021-10-17 ENCOUNTER — Telehealth: Payer: No Typology Code available for payment source | Admitting: Physician Assistant

## 2021-10-17 ENCOUNTER — Other Ambulatory Visit: Payer: Self-pay

## 2021-10-17 DIAGNOSIS — J069 Acute upper respiratory infection, unspecified: Secondary | ICD-10-CM | POA: Diagnosis not present

## 2021-10-17 MED ORDER — FLUTICASONE PROPIONATE 50 MCG/ACT NA SUSP
2.0000 | Freq: Every day | NASAL | 0 refills | Status: DC
  Filled 2021-10-17: qty 16, 30d supply, fill #0

## 2021-10-17 MED ORDER — BENZONATATE 100 MG PO CAPS
100.0000 mg | ORAL_CAPSULE | Freq: Three times a day (TID) | ORAL | 0 refills | Status: DC | PRN
  Filled 2021-10-17: qty 30, 10d supply, fill #0

## 2021-10-17 NOTE — Progress Notes (Signed)
E-Visit for Upper Respiratory Infection   We are sorry you are not feeling well.  Here is how we plan to help!  Based on what you have shared with me, it looks like you may have a viral upper respiratory infection.  Upper respiratory infections are caused by a large number of viruses; however, rhinovirus is the most common cause.   Symptoms vary from person to person, with common symptoms including sore throat, cough, fatigue or lack of energy and feeling of general discomfort.  A low-grade fever of up to 100.4 may present, but is often uncommon.  Symptoms vary however, and are closely related to a person's age or underlying illnesses.  The most common symptoms associated with an upper respiratory infection are nasal discharge or congestion, cough, sneezing, headache and pressure in the ears and face.  These symptoms usually persist for about 3 to 10 days, but can last up to 2 weeks.  It is important to know that upper respiratory infections do not cause serious illness or complications in most cases.    Upper respiratory infections can be transmitted from person to person, with the most common method of transmission being a person's hands.  The virus is able to live on the skin and can infect other persons for up to 2 hours after direct contact.  Also, these can be transmitted when someone coughs or sneezes; thus, it is important to cover the mouth to reduce this risk.  To keep the spread of the illness at bay, good hand hygiene is very important.  This is an infection that is most likely caused by a virus. There are no specific treatments other than to help you with the symptoms until the infection runs its course.  We are sorry you are not feeling well.  Here is how we plan to help!   For nasal congestion, you may use an oral decongestants such as Mucinex D or if you have glaucoma or high blood pressure use plain Mucinex.  Saline nasal spray or nasal drops can help and can safely be used as often as  needed for congestion.  For your congestion, I have prescribed Fluticasone nasal spray one spray in each nostril twice a day  If you do not have a history of heart disease, hypertension, diabetes or thyroid disease, prostate/bladder issues or glaucoma, you may also use Sudafed to treat nasal congestion.  It is highly recommended that you consult with a pharmacist or your primary care physician to ensure this medication is safe for you to take.     If you have a cough, you may use cough suppressants such as Delsym and Robitussin.  If you have glaucoma or high blood pressure, you can also use Coricidin HBP.   For cough I have prescribed for you A prescription cough medication called Tessalon Perles 100 mg. You may take 1-2 capsules every 8 hours as needed for cough  If you have a sore or scratchy throat, use a saltwater gargle-  to  teaspoon of salt dissolved in a 4-ounce to 8-ounce glass of warm water.  Gargle the solution for approximately 15-30 seconds and then spit.  It is important not to swallow the solution.  You can also use throat lozenges/cough drops and Chloraseptic spray to help with throat pain or discomfort.  Warm or cold liquids can also be helpful in relieving throat pain.  For headache, pain or general discomfort, you can use Ibuprofen or Tylenol as directed.   Some authorities believe   that zinc sprays or the use of Echinacea may shorten the course of your symptoms.   HOME CARE Only take medications as instructed by your medical team. Be sure to drink plenty of fluids. Water is fine as well as fruit juices, sodas and electrolyte beverages. You may want to stay away from caffeine or alcohol. If you are nauseated, try taking small sips of liquids. How do you know if you are getting enough fluid? Your urine should be a pale yellow or almost colorless. Get rest. Taking a steamy shower or using a humidifier may help nasal congestion and ease sore throat pain. You can place a towel over  your head and breathe in the steam from hot water coming from a faucet. Using a saline nasal spray works much the same way. Cough drops, hard candies and sore throat lozenges may ease your cough. Avoid close contacts especially the very young and the elderly Cover your mouth if you cough or sneeze Always remember to wash your hands.   GET HELP RIGHT AWAY IF: You develop worsening fever. If your symptoms do not improve within 10 days You develop yellow or green discharge from your nose over 3 days. You have coughing fits You develop a severe head ache or visual changes. You develop shortness of breath, difficulty breathing or start having chest pain Your symptoms persist after you have completed your treatment plan  MAKE SURE YOU  Understand these instructions. Will watch your condition. Will get help right away if you are not doing well or get worse.  Thank you for choosing an e-visit.  Your e-visit answers were reviewed by a board certified advanced clinical practitioner to complete your personal care plan. Depending upon the condition, your plan could have included both over the counter or prescription medications.  Please review your pharmacy choice. Make sure the pharmacy is open so you can pick up prescription now. If there is a problem, you may contact your provider through MyChart messaging and have the prescription routed to another pharmacy.  Your safety is important to us. If you have drug allergies check your prescription carefully.   For the next 24 hours you can use MyChart to ask questions about today's visit, request a non-urgent call back, or ask for a work or school excuse. You will get an email in the next two days asking about your experience. I hope that your e-visit has been valuable and will speed your recovery.  I have spent 5 minutes in review of e-visit questionnaire, review and updating patient chart, medical decision making and response to patient.    Jenine Krisher M Gearldine Looney, PA-C  

## 2021-10-23 ENCOUNTER — Telehealth: Payer: No Typology Code available for payment source | Admitting: Nurse Practitioner

## 2021-10-23 ENCOUNTER — Other Ambulatory Visit: Payer: Self-pay

## 2021-10-23 DIAGNOSIS — J0100 Acute maxillary sinusitis, unspecified: Secondary | ICD-10-CM

## 2021-10-23 MED ORDER — AMOXICILLIN-POT CLAVULANATE 875-125 MG PO TABS
1.0000 | ORAL_TABLET | Freq: Two times a day (BID) | ORAL | 0 refills | Status: DC
  Filled 2021-10-23: qty 14, 7d supply, fill #0

## 2021-10-23 NOTE — Progress Notes (Signed)

## 2021-10-30 ENCOUNTER — Other Ambulatory Visit: Payer: Self-pay | Admitting: Nurse Practitioner

## 2021-10-30 MED ORDER — ZOLPIDEM TARTRATE 10 MG PO TABS
10.0000 mg | ORAL_TABLET | Freq: Every day | ORAL | 0 refills | Status: DC
  Filled 2021-10-31: qty 30, 30d supply, fill #0

## 2021-10-31 ENCOUNTER — Other Ambulatory Visit: Payer: Self-pay

## 2021-11-25 ENCOUNTER — Other Ambulatory Visit: Payer: Self-pay | Admitting: Internal Medicine

## 2021-11-25 ENCOUNTER — Other Ambulatory Visit: Payer: Self-pay | Admitting: Urology

## 2021-11-25 ENCOUNTER — Other Ambulatory Visit: Payer: Self-pay

## 2021-11-26 ENCOUNTER — Other Ambulatory Visit: Payer: Self-pay

## 2021-11-26 MED FILL — Tadalafil Tab 5 MG: ORAL | 30 days supply | Qty: 30 | Fill #0 | Status: AC

## 2021-11-27 ENCOUNTER — Other Ambulatory Visit: Payer: Self-pay

## 2021-11-27 MED FILL — Zolpidem Tartrate Tab 10 MG: ORAL | 30 days supply | Qty: 30 | Fill #0 | Status: CN

## 2021-11-29 ENCOUNTER — Other Ambulatory Visit: Payer: Self-pay

## 2021-11-29 MED FILL — Zolpidem Tartrate Tab 10 MG: ORAL | 30 days supply | Qty: 30 | Fill #0 | Status: AC

## 2021-12-18 ENCOUNTER — Other Ambulatory Visit: Payer: Self-pay | Admitting: Urology

## 2021-12-19 ENCOUNTER — Other Ambulatory Visit: Payer: Self-pay | Admitting: Internal Medicine

## 2021-12-19 ENCOUNTER — Other Ambulatory Visit: Payer: Self-pay

## 2021-12-19 MED FILL — Semaglutide Soln Pen-inj 0.25 or 0.5 MG/DOSE (2 MG/3ML): SUBCUTANEOUS | 28 days supply | Qty: 3 | Fill #0 | Status: AC

## 2021-12-20 ENCOUNTER — Other Ambulatory Visit: Payer: Self-pay

## 2021-12-26 ENCOUNTER — Other Ambulatory Visit: Payer: Self-pay | Admitting: Internal Medicine

## 2021-12-27 ENCOUNTER — Other Ambulatory Visit: Payer: Self-pay

## 2021-12-31 ENCOUNTER — Other Ambulatory Visit: Payer: Self-pay

## 2021-12-31 ENCOUNTER — Other Ambulatory Visit: Payer: Self-pay | Admitting: Internal Medicine

## 2022-01-01 ENCOUNTER — Other Ambulatory Visit: Payer: Self-pay | Admitting: Urology

## 2022-01-01 DIAGNOSIS — N529 Male erectile dysfunction, unspecified: Secondary | ICD-10-CM

## 2022-01-02 ENCOUNTER — Other Ambulatory Visit: Payer: Self-pay

## 2022-01-02 ENCOUNTER — Other Ambulatory Visit: Payer: Self-pay | Admitting: Nurse Practitioner

## 2022-01-04 ENCOUNTER — Other Ambulatory Visit: Payer: Self-pay

## 2022-01-04 MED FILL — Zolpidem Tartrate Tab 10 MG: ORAL | 30 days supply | Qty: 30 | Fill #0 | Status: AC

## 2022-01-05 ENCOUNTER — Other Ambulatory Visit: Payer: Self-pay

## 2022-01-06 ENCOUNTER — Other Ambulatory Visit: Payer: Self-pay

## 2022-01-07 ENCOUNTER — Other Ambulatory Visit: Payer: Self-pay

## 2022-01-20 MED FILL — Metoprolol Succinate Tab ER 24HR 50 MG (Tartrate Equiv): ORAL | 90 days supply | Qty: 90 | Fill #2 | Status: AC

## 2022-01-23 ENCOUNTER — Other Ambulatory Visit: Payer: Self-pay

## 2022-01-23 MED FILL — Semaglutide Soln Pen-inj 0.25 or 0.5 MG/DOSE (2 MG/3ML): SUBCUTANEOUS | 28 days supply | Qty: 3 | Fill #1 | Status: AC

## 2022-01-30 ENCOUNTER — Other Ambulatory Visit: Payer: Self-pay

## 2022-01-30 DIAGNOSIS — H47011 Ischemic optic neuropathy, right eye: Secondary | ICD-10-CM | POA: Diagnosis not present

## 2022-01-30 DIAGNOSIS — E119 Type 2 diabetes mellitus without complications: Secondary | ICD-10-CM | POA: Diagnosis not present

## 2022-01-30 DIAGNOSIS — H2512 Age-related nuclear cataract, left eye: Secondary | ICD-10-CM | POA: Diagnosis not present

## 2022-01-30 MED ORDER — OZEMPIC (0.25 OR 0.5 MG/DOSE) 2 MG/3ML ~~LOC~~ SOPN: 0.5000 mg | PEN_INJECTOR | SUBCUTANEOUS | 2 refills | Status: DC

## 2022-01-31 ENCOUNTER — Other Ambulatory Visit: Payer: Self-pay

## 2022-01-31 DIAGNOSIS — T84013A Broken internal left knee prosthesis, initial encounter: Secondary | ICD-10-CM | POA: Diagnosis not present

## 2022-01-31 MED ORDER — MELOXICAM 15 MG PO TABS
15.0000 mg | ORAL_TABLET | Freq: Every day | ORAL | 1 refills | Status: DC
  Filled 2022-01-31: qty 30, 30d supply, fill #0
  Filled 2022-02-21: qty 30, 30d supply, fill #1

## 2022-02-06 ENCOUNTER — Other Ambulatory Visit: Payer: Self-pay | Admitting: Internal Medicine

## 2022-02-06 MED FILL — Metformin HCl Tab ER 24HR 500 MG: ORAL | 90 days supply | Qty: 180 | Fill #3 | Status: AC

## 2022-02-07 ENCOUNTER — Other Ambulatory Visit: Payer: Self-pay

## 2022-02-07 MED ORDER — ZOLPIDEM TARTRATE 10 MG PO TABS
10.0000 mg | ORAL_TABLET | Freq: Every day | ORAL | 2 refills | Status: DC
  Filled 2022-02-07: qty 30, 30d supply, fill #0
  Filled 2022-03-10: qty 30, 30d supply, fill #1
  Filled 2022-04-08: qty 30, 30d supply, fill #2

## 2022-02-20 ENCOUNTER — Other Ambulatory Visit: Payer: Self-pay

## 2022-02-20 MED FILL — Semaglutide Soln Pen-inj 0.25 or 0.5 MG/DOSE (2 MG/3ML): SUBCUTANEOUS | 28 days supply | Qty: 3 | Fill #2 | Status: AC

## 2022-02-21 ENCOUNTER — Other Ambulatory Visit: Payer: Self-pay

## 2022-02-25 ENCOUNTER — Other Ambulatory Visit: Payer: Self-pay

## 2022-03-11 ENCOUNTER — Other Ambulatory Visit: Payer: Self-pay

## 2022-03-17 ENCOUNTER — Other Ambulatory Visit: Payer: Self-pay

## 2022-03-17 MED ORDER — OZEMPIC (0.25 OR 0.5 MG/DOSE) 2 MG/3ML ~~LOC~~ SOPN
0.5000 mg | PEN_INJECTOR | SUBCUTANEOUS | 6 refills | Status: DC
Start: 2022-03-17 — End: 2022-09-03
  Filled 2022-03-17: qty 3, 28d supply, fill #0
  Filled 2022-04-28: qty 9, 84d supply, fill #1
  Filled 2022-07-16: qty 9, 84d supply, fill #2

## 2022-03-23 ENCOUNTER — Other Ambulatory Visit: Payer: Self-pay | Admitting: Cardiovascular Disease

## 2022-03-24 ENCOUNTER — Other Ambulatory Visit: Payer: Self-pay

## 2022-03-24 MED ORDER — ROSUVASTATIN CALCIUM 40 MG PO TABS
40.0000 mg | ORAL_TABLET | Freq: Every day | ORAL | 0 refills | Status: DC
  Filled 2022-03-24: qty 90, 90d supply, fill #0

## 2022-04-09 ENCOUNTER — Other Ambulatory Visit: Payer: Self-pay

## 2022-04-11 ENCOUNTER — Other Ambulatory Visit (HOSPITAL_COMMUNITY): Payer: Self-pay

## 2022-04-11 MED ORDER — SILDENAFIL CITRATE 100 MG PO TABS
100.0000 mg | ORAL_TABLET | Freq: Every day | ORAL | 3 refills | Status: DC
  Filled 2022-04-11 – 2022-04-14 (×2): qty 10, 10d supply, fill #0

## 2022-04-14 ENCOUNTER — Other Ambulatory Visit (HOSPITAL_COMMUNITY): Payer: Self-pay

## 2022-04-14 ENCOUNTER — Other Ambulatory Visit: Payer: Self-pay

## 2022-04-28 ENCOUNTER — Other Ambulatory Visit: Payer: Self-pay

## 2022-04-28 MED FILL — Metoprolol Succinate Tab ER 24HR 50 MG (Tartrate Equiv): ORAL | 90 days supply | Qty: 90 | Fill #3 | Status: AC

## 2022-05-12 ENCOUNTER — Other Ambulatory Visit: Payer: Self-pay

## 2022-05-13 ENCOUNTER — Other Ambulatory Visit: Payer: Self-pay

## 2022-05-16 ENCOUNTER — Other Ambulatory Visit: Payer: Self-pay

## 2022-05-21 ENCOUNTER — Other Ambulatory Visit: Payer: Self-pay

## 2022-05-22 ENCOUNTER — Other Ambulatory Visit: Payer: Self-pay

## 2022-05-23 ENCOUNTER — Other Ambulatory Visit: Payer: Self-pay

## 2022-06-13 ENCOUNTER — Other Ambulatory Visit: Payer: Self-pay

## 2022-06-13 MED ORDER — AMOXICILLIN-POT CLAVULANATE 875-125 MG PO TABS
1.0000 | ORAL_TABLET | Freq: Two times a day (BID) | ORAL | 0 refills | Status: DC
  Filled 2022-06-13: qty 28, 14d supply, fill #0

## 2022-06-19 ENCOUNTER — Other Ambulatory Visit: Payer: Self-pay

## 2022-06-19 ENCOUNTER — Other Ambulatory Visit: Payer: Self-pay | Admitting: Cardiovascular Disease

## 2022-06-20 ENCOUNTER — Other Ambulatory Visit (HOSPITAL_COMMUNITY): Payer: Self-pay

## 2022-06-20 ENCOUNTER — Other Ambulatory Visit: Payer: Self-pay

## 2022-06-20 MED ORDER — OMEPRAZOLE 20 MG PO CPDR
20.0000 mg | DELAYED_RELEASE_CAPSULE | Freq: Every day | ORAL | 0 refills | Status: DC
  Filled 2022-06-20 (×2): qty 90, 90d supply, fill #0

## 2022-06-20 MED ORDER — ROSUVASTATIN CALCIUM 40 MG PO TABS
40.0000 mg | ORAL_TABLET | Freq: Every day | ORAL | 0 refills | Status: DC
  Filled 2022-06-20: qty 30, 30d supply, fill #0

## 2022-06-20 MED ORDER — METFORMIN HCL 500 MG PO TABS
500.0000 mg | ORAL_TABLET | Freq: Two times a day (BID) | ORAL | 0 refills | Status: DC
  Filled 2022-06-23: qty 180, 90d supply, fill #0

## 2022-06-20 MED ORDER — METFORMIN HCL 500 MG PO TABS
500.0000 mg | ORAL_TABLET | Freq: Two times a day (BID) | ORAL | 0 refills | Status: DC
  Filled 2022-06-20 (×2): qty 3, 2d supply, fill #0

## 2022-06-20 MED ORDER — METOPROLOL SUCCINATE ER 50 MG PO TB24
50.0000 mg | ORAL_TABLET | Freq: Every day | ORAL | 0 refills | Status: DC
  Filled 2022-06-20 – 2022-08-07 (×2): qty 90, 90d supply, fill #0

## 2022-06-23 ENCOUNTER — Other Ambulatory Visit: Payer: Self-pay

## 2022-07-13 IMAGING — CT CT RENAL STONE PROTOCOL
2 of 5 series · 16 of 46 positions shown, 18 images · non-contrast
Comparison: CT abdomen pelvis 01/28/2007

CLINICAL DATA: Flank pain, kidney stone suspected.

EXAM:
CT ABDOMEN AND PELVIS WITHOUT CONTRAST
TECHNIQUE: Multidetector CT imaging of the abdomen and pelvis was performed
following the standard protocol without IV contrast.

[Series 3: stone full standard · axial · 0.80mm/px · z∈[-670,-194]mm · 13 of 105 slices shown, 15 images]
[im 5/105  soft-tissue]
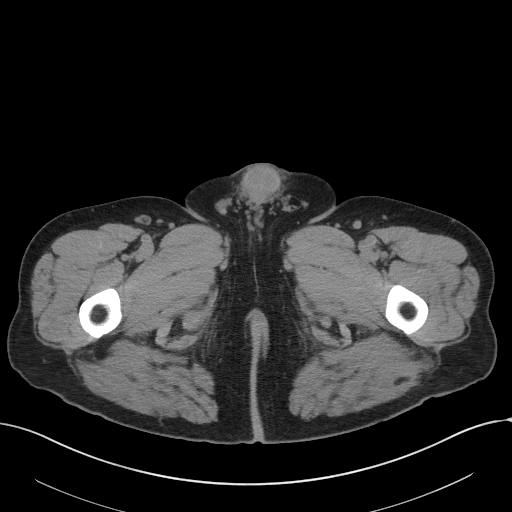
[im 5/105  bone]
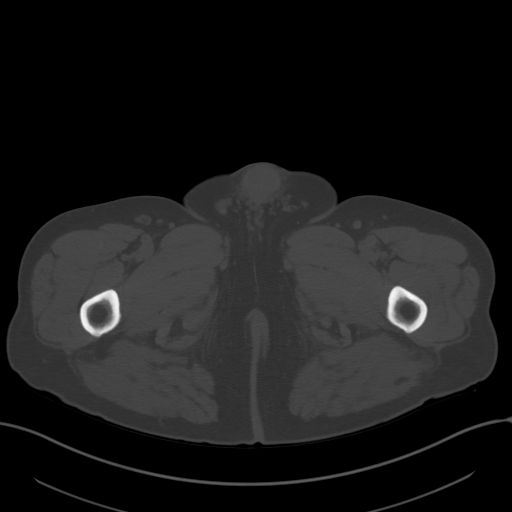
[im 15/105  soft-tissue]
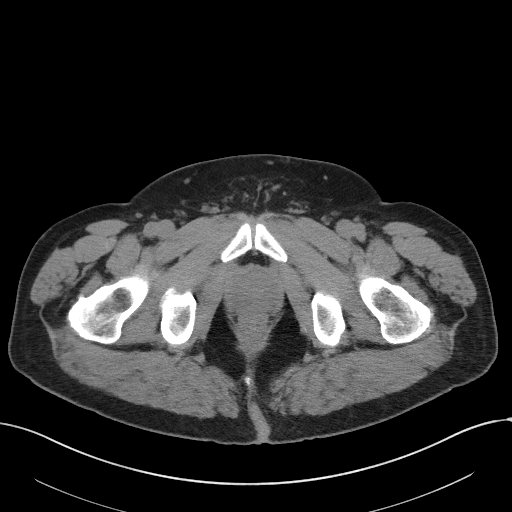
[im 24/105  soft-tissue]
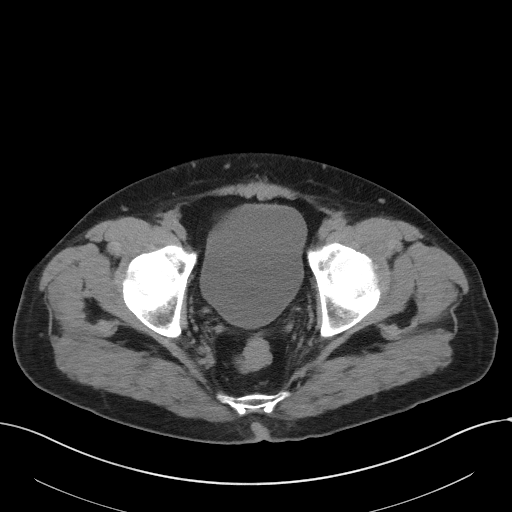
[im 29/105  soft-tissue]
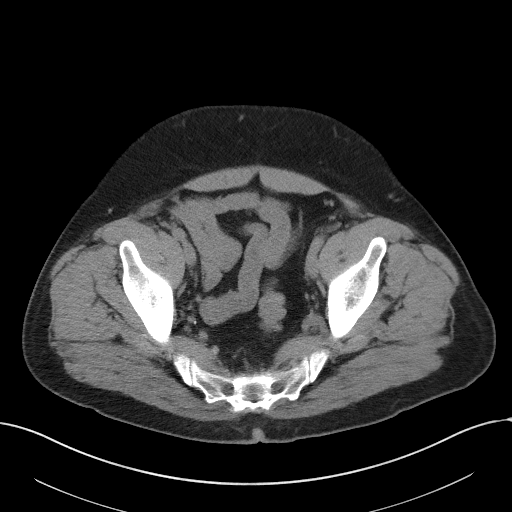
[im 38/105  soft-tissue]
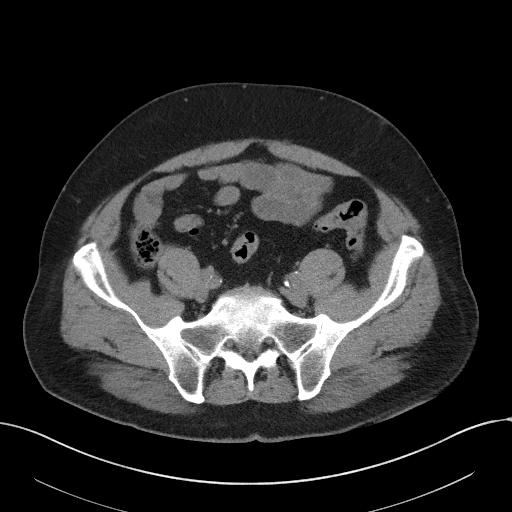
[im 43/105  soft-tissue]
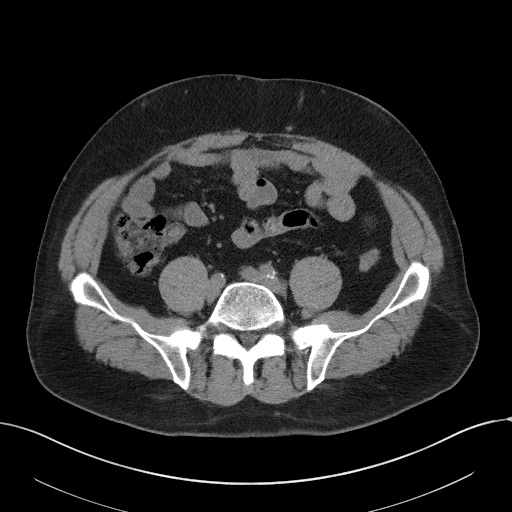
[im 53/105  soft-tissue]
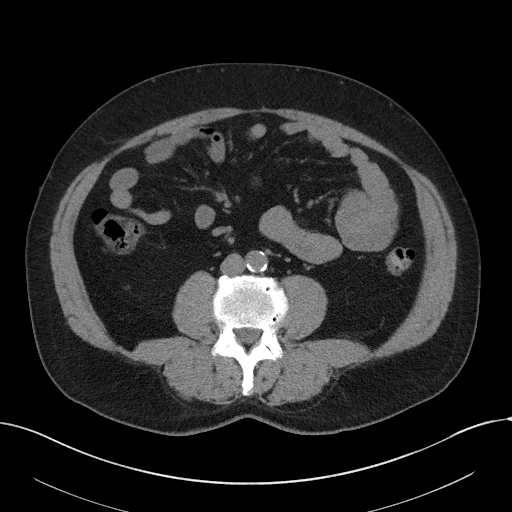
[im 62/105  soft-tissue]
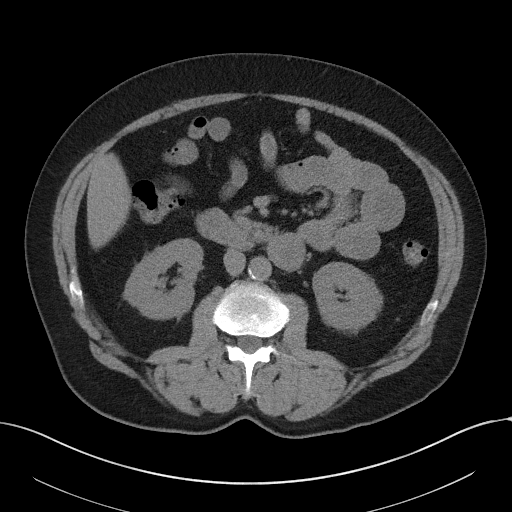
[im 67/105  soft-tissue]
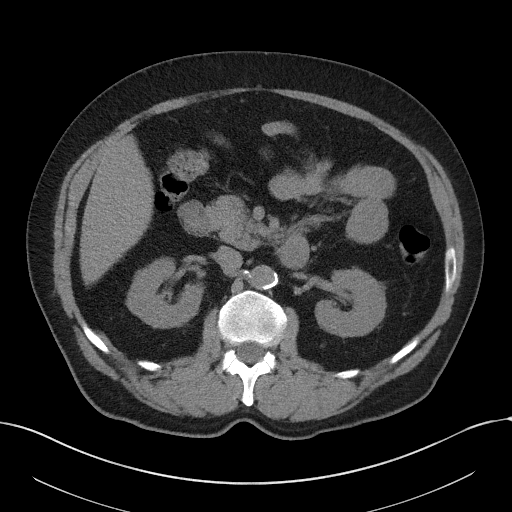
[im 67/105  bone]
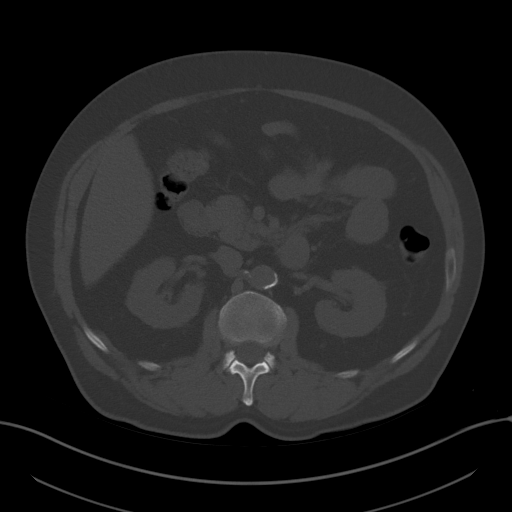
[im 76/105  soft-tissue]
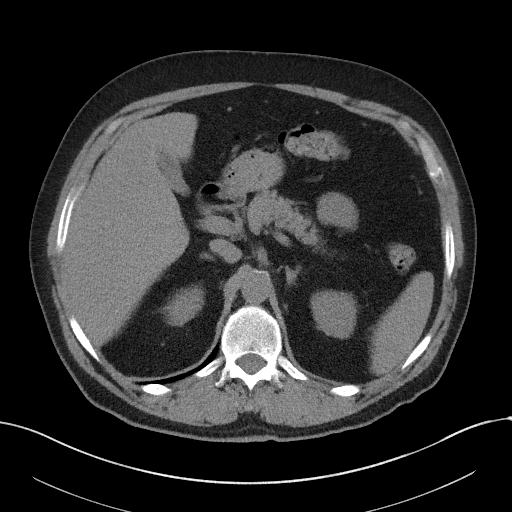
[im 81/105  soft-tissue]
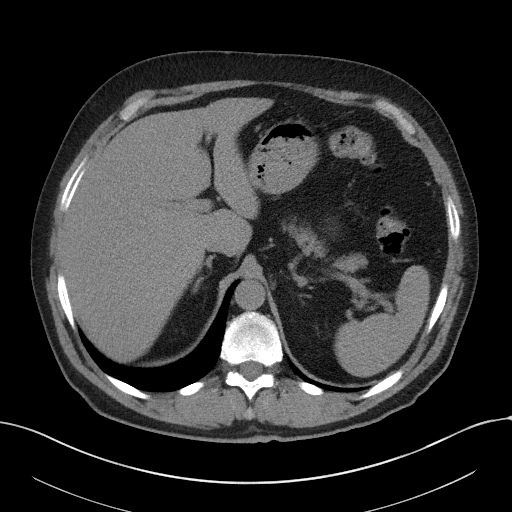
[im 90/105  soft-tissue]
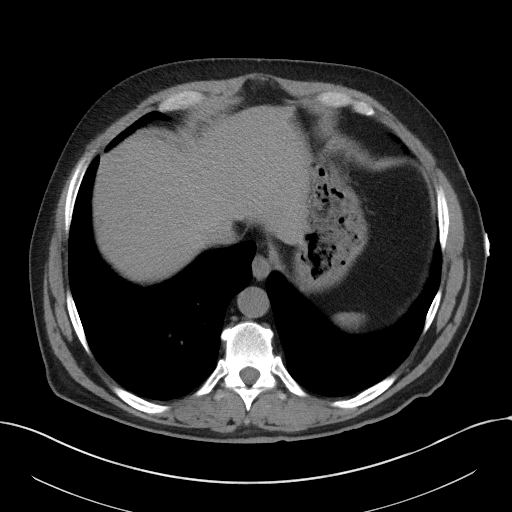
[im 100/105  soft-tissue]
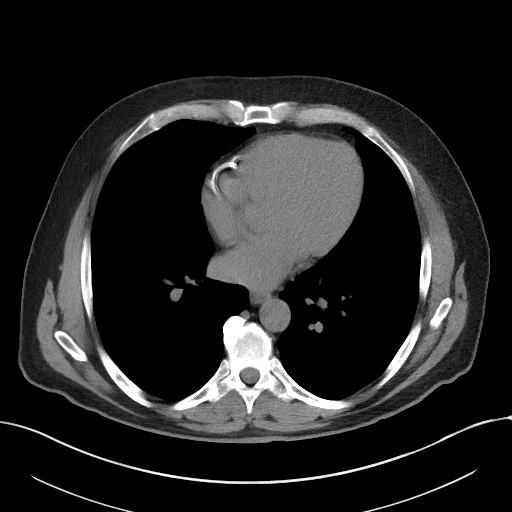

[Series 6: coronal · coronal · 0.77mm/px · 3 of 169 slices shown]
[im 57/169  soft-tissue]
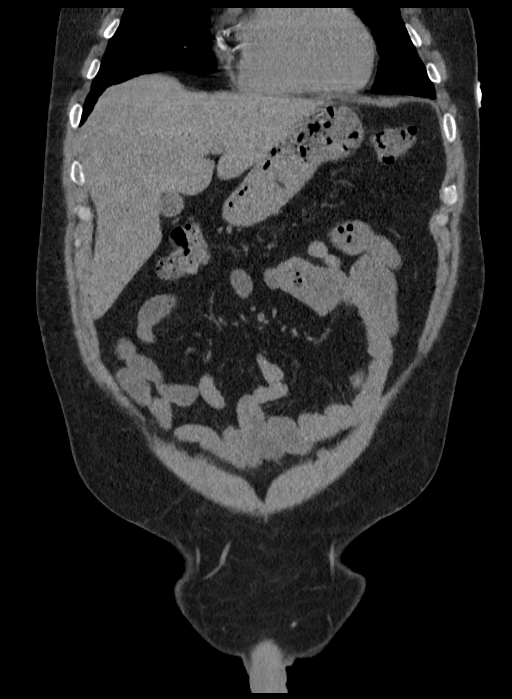
[im 75/169  soft-tissue]
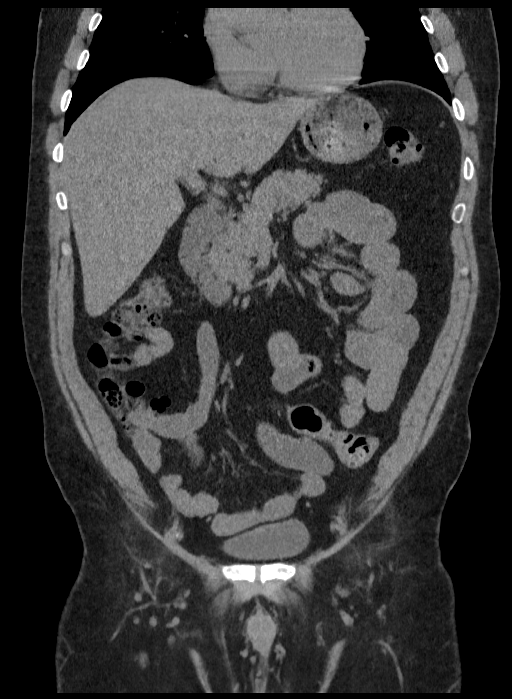
[im 94/169  soft-tissue]
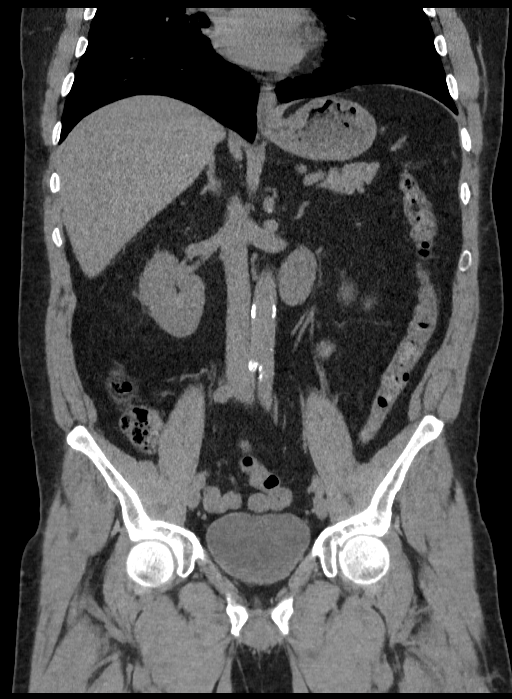

[16 of 46 positions shown; findings below may reference images not displayed]

FINDINGS: Lower chest: No acute abnormality.

Evaluation of the abdominal viscera limited by the lack of IV
contrast.

Hepatobiliary: No focal liver abnormality is seen. Normal appearance
of the gallbladder.

Pancreas: Unremarkable. No surrounding inflammatory changes.

Spleen: Normal in size without focal abnormality.

Adrenals/Urinary Tract: Adrenal glands are unremarkable. Kidneys are
symmetric in size. No hydronephrosis or renal calculi. No large mass
lesion. Urinary bladder is unremarkable. The

Stomach/Bowel: Stomach is within normal limits. Appendix appears
normal. No evidence of bowel wall thickening, distention, or
inflammatory changes.

Vascular/Lymphatic: Aortic atherosclerosis. Vascular patency cannot
be assessed in the absence of IV contrast. No enlarged abdominal or
pelvic lymph nodes.

Reproductive: Prostate is unremarkable.

Other: No abdominal wall hernia or abnormality. No abdominopelvic
ascites.

Musculoskeletal: No acute or significant osseous findings.
IMPRESSION: 1. No evidence of acute intra-abdominal pathology on a noncontrast
exam. No evidence of renal calculi.
2. Aortic atherosclerosis.

Aortic Atherosclerosis ([id number]).  The

## 2022-07-13 IMAGING — CR DG CHEST 2V
1 series · 2 of 2 positions shown · non-contrast
Comparison: 05/03/2012

CLINICAL DATA: Chest pain and shortness of breath for 3 days.
Hypertension and diabetes. Nonsmoker.

EXAM:
CHEST - 2 VIEW

[Series 1: dg chest 2 view · 0.14mm/px · 2 of 2 slices shown]
[im 1/2]
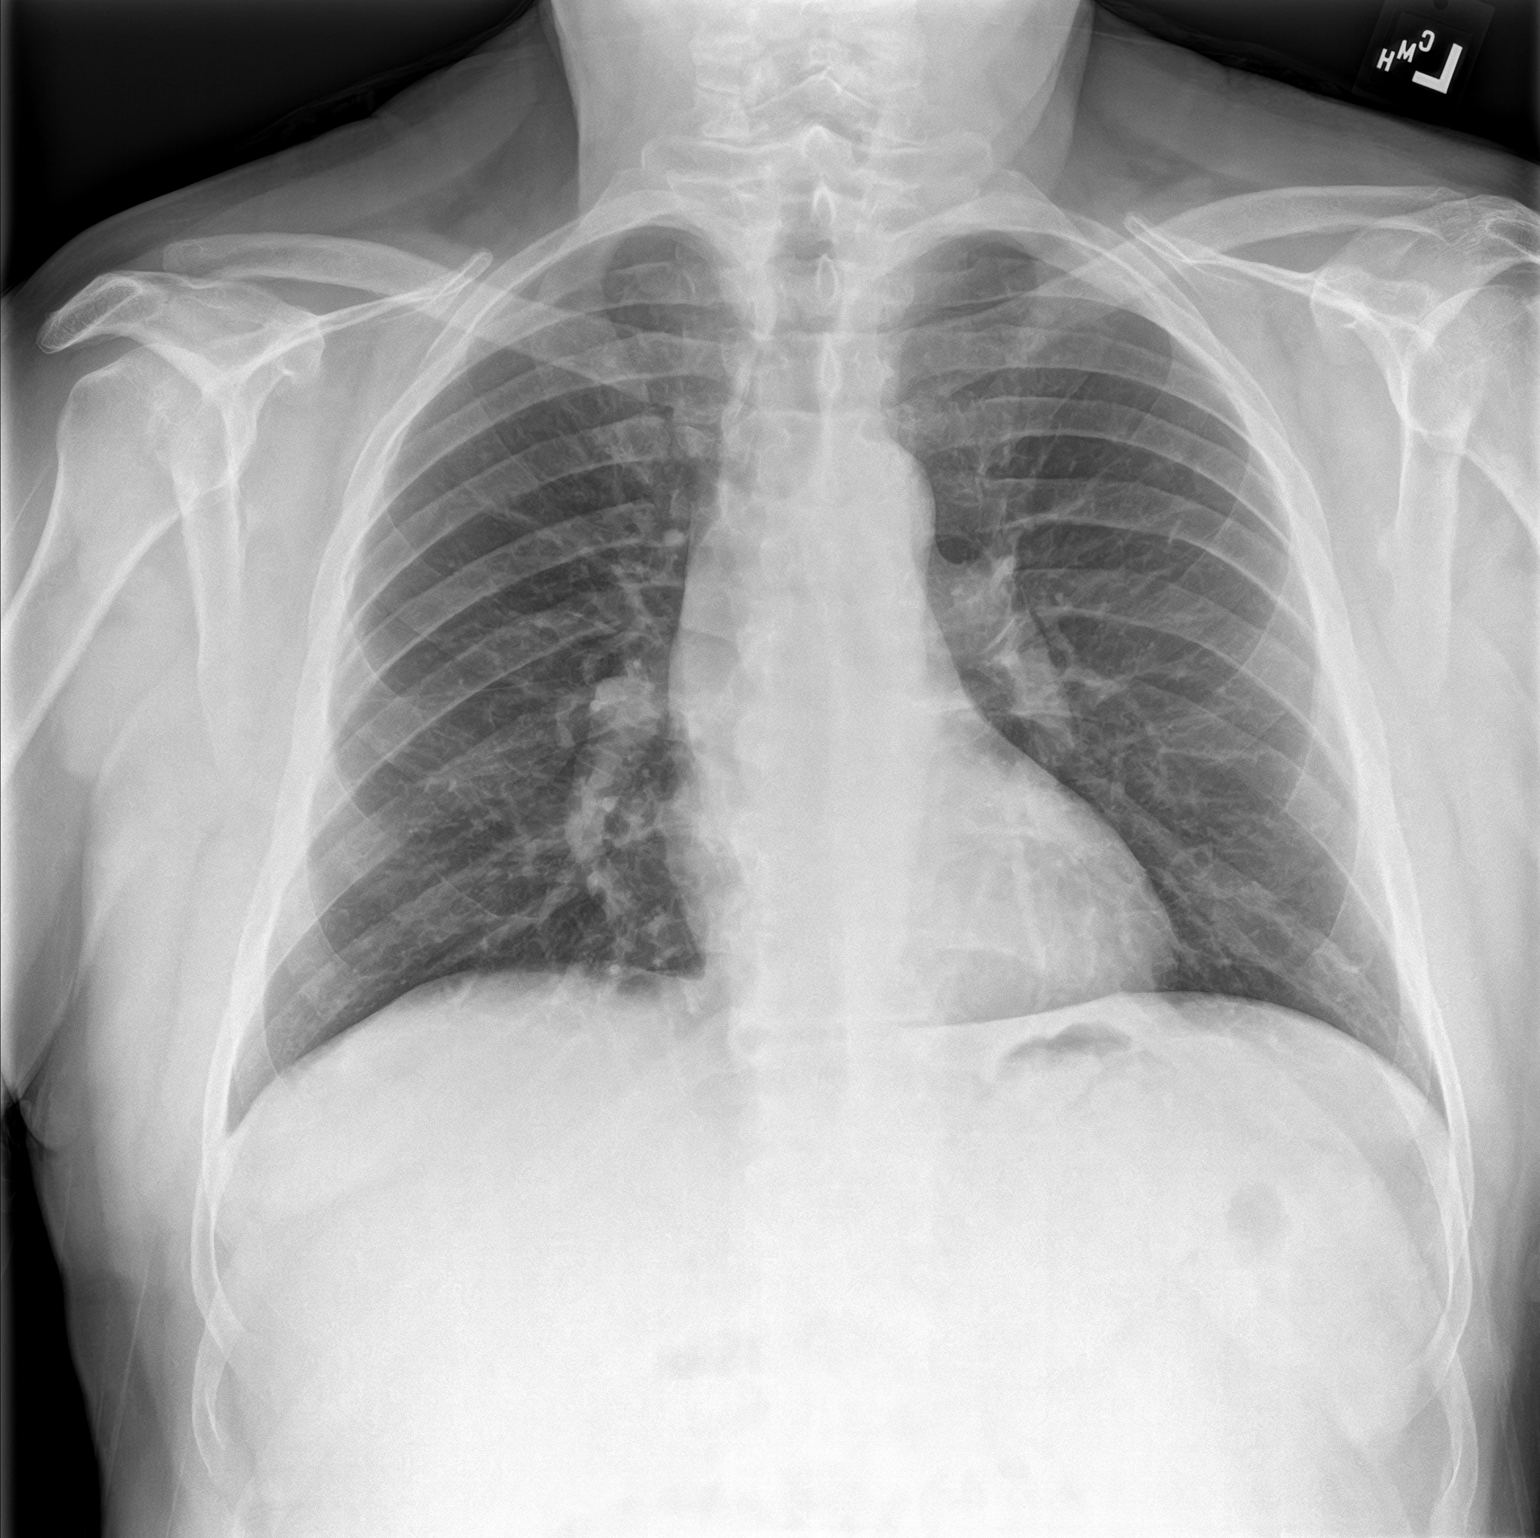
[im 2/2]
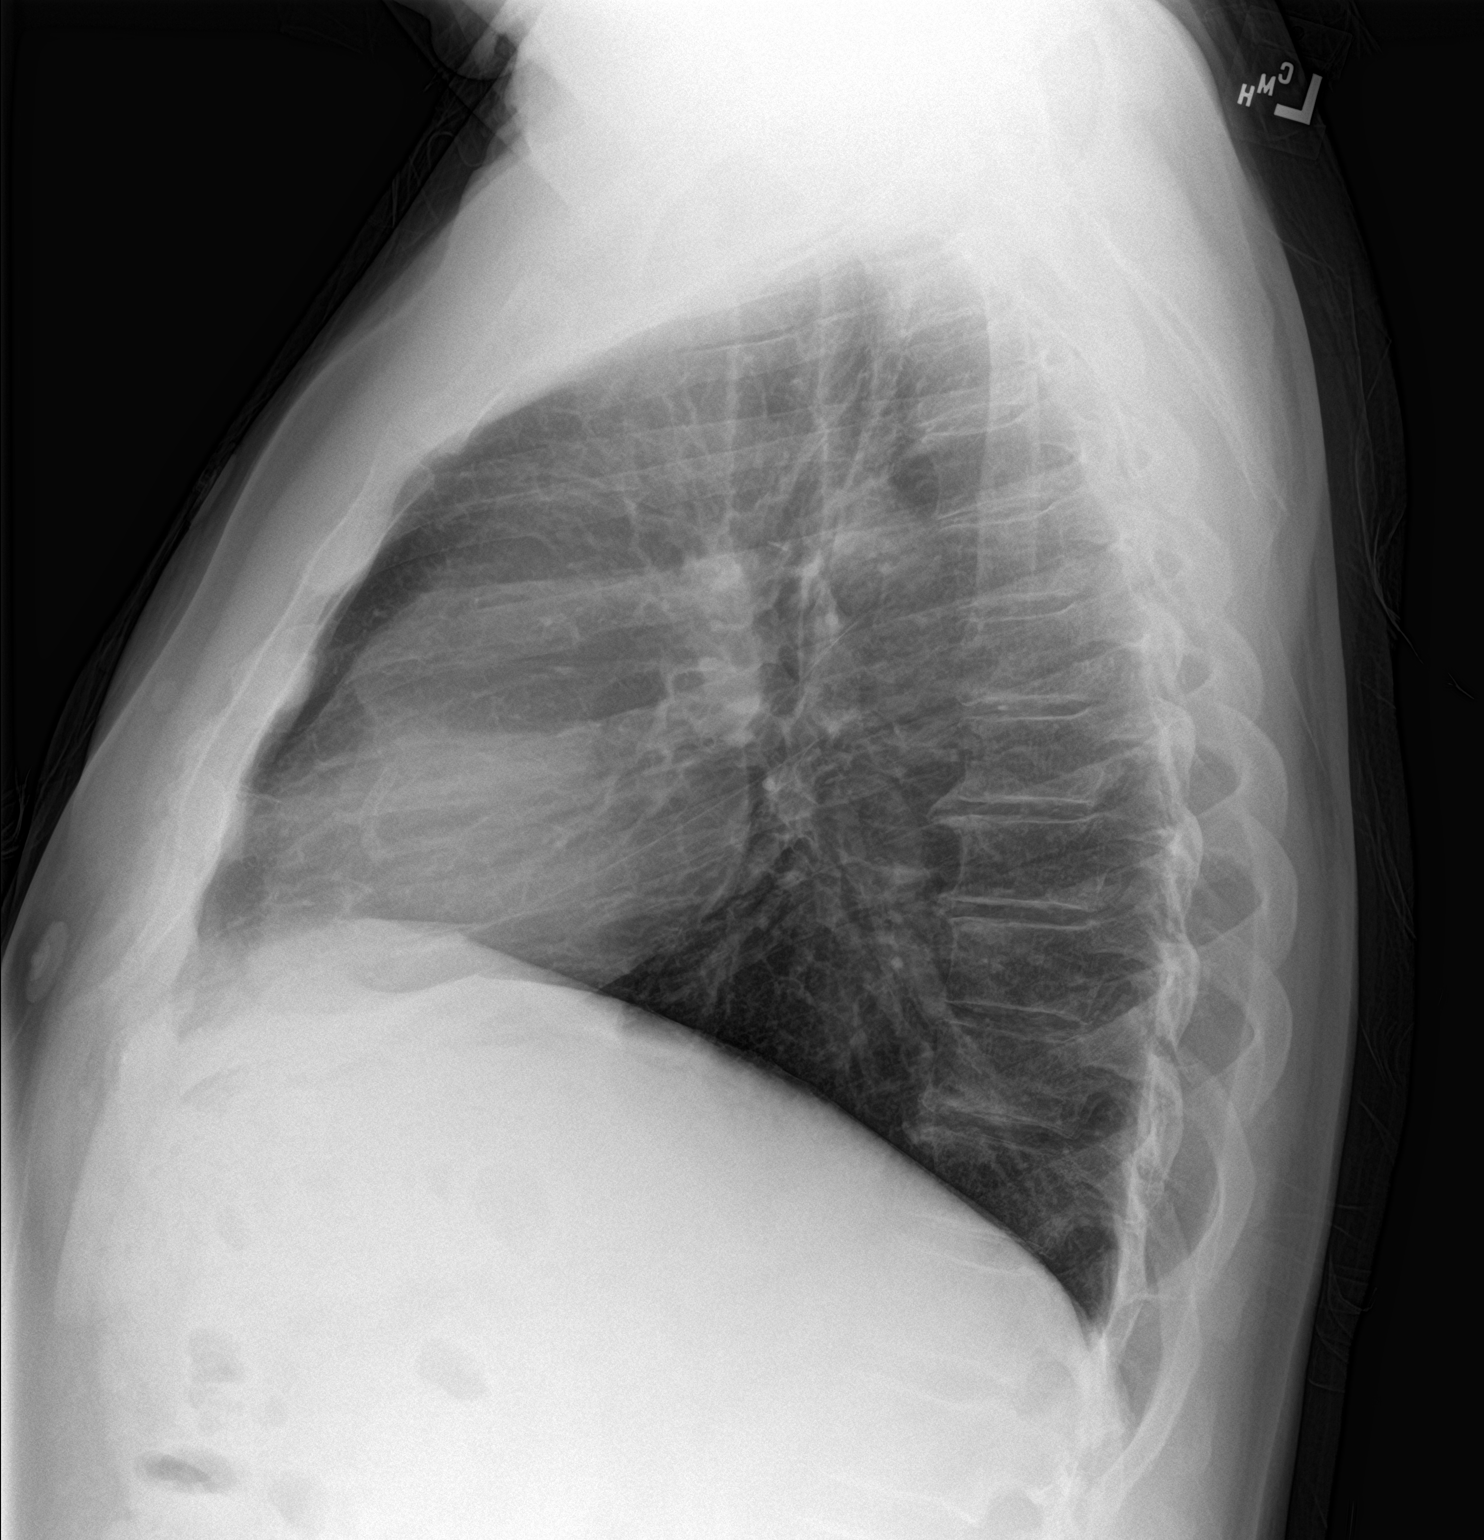

[2 of 2 positions shown; findings below may reference images not displayed]

FINDINGS: Midline trachea. Normal heart size and mediastinal contours. No
pleural effusion or pneumothorax. Clear lungs.
IMPRESSION: No acute cardiopulmonary disease.

## 2022-07-16 ENCOUNTER — Other Ambulatory Visit: Payer: Self-pay

## 2022-08-05 ENCOUNTER — Other Ambulatory Visit: Payer: Self-pay | Admitting: Cardiovascular Disease

## 2022-08-05 ENCOUNTER — Other Ambulatory Visit: Payer: Self-pay

## 2022-08-06 ENCOUNTER — Other Ambulatory Visit: Payer: Self-pay

## 2022-08-06 NOTE — Telephone Encounter (Signed)
Please contact pt for future appointment. Pt due for 12 month f/u. 

## 2022-08-07 ENCOUNTER — Other Ambulatory Visit: Payer: Self-pay

## 2022-08-07 ENCOUNTER — Other Ambulatory Visit: Payer: Self-pay | Admitting: Cardiovascular Disease

## 2022-08-07 NOTE — Telephone Encounter (Signed)
Please contact pt for future appointment. Pt due for 12 month f/u. 

## 2022-08-08 ENCOUNTER — Other Ambulatory Visit: Payer: Self-pay

## 2022-08-08 ENCOUNTER — Other Ambulatory Visit: Payer: Self-pay | Admitting: Cardiovascular Disease

## 2022-08-08 MED ORDER — ROSUVASTATIN CALCIUM 40 MG PO TABS
40.0000 mg | ORAL_TABLET | Freq: Every day | ORAL | 0 refills | Status: DC
  Filled 2022-08-08: qty 30, 30d supply, fill #0

## 2022-08-08 MED ORDER — METOPROLOL SUCCINATE ER 50 MG PO TB24
50.0000 mg | ORAL_TABLET | Freq: Every day | ORAL | 0 refills | Status: DC
  Filled 2022-08-08: qty 90, 90d supply, fill #0

## 2022-08-08 MED ORDER — METFORMIN HCL 500 MG PO TABS
500.0000 mg | ORAL_TABLET | Freq: Two times a day (BID) | ORAL | 0 refills | Status: DC
  Filled 2022-08-08: qty 180, 90d supply, fill #0

## 2022-08-11 ENCOUNTER — Other Ambulatory Visit: Payer: Self-pay

## 2022-08-28 ENCOUNTER — Encounter: Payer: Self-pay | Admitting: Cardiovascular Disease

## 2022-08-28 ENCOUNTER — Ambulatory Visit: Payer: 59 | Attending: Cardiovascular Disease | Admitting: Cardiovascular Disease

## 2022-08-28 VITALS — BP 140/80 | HR 70 | Ht 71.0 in | Wt 199.0 lb

## 2022-08-28 DIAGNOSIS — E119 Type 2 diabetes mellitus without complications: Secondary | ICD-10-CM | POA: Diagnosis not present

## 2022-08-28 DIAGNOSIS — I2510 Atherosclerotic heart disease of native coronary artery without angina pectoris: Secondary | ICD-10-CM | POA: Diagnosis not present

## 2022-08-28 DIAGNOSIS — I10 Essential (primary) hypertension: Secondary | ICD-10-CM

## 2022-08-28 DIAGNOSIS — Z7984 Long term (current) use of oral hypoglycemic drugs: Secondary | ICD-10-CM | POA: Diagnosis not present

## 2022-08-28 DIAGNOSIS — E785 Hyperlipidemia, unspecified: Secondary | ICD-10-CM | POA: Diagnosis not present

## 2022-08-28 NOTE — Patient Instructions (Addendum)
Medication Instructions:  No changes *If you need a refill on your cardiac medications before your next appointment, please call your pharmacy*   Lab Work: Your provider would like for you to have following labs drawn today CBC, CMET, A1C, and Lipid .   If you have labs (blood work) drawn today and your tests are completely normal, you will receive your results only by: MyChart Message (if you have MyChart) OR A paper copy in the mail If you have any lab test that is abnormal or we need to change your treatment, we will call you to review the results.   Testing/Procedures: None ordered   Follow-Up: At [facility], you and your health needs are our priority.  As part of our continuing mission to provide you with exceptional heart care, we have created designated Provider Care Teams.  These Care Teams include your primary Cardiologist (physician) and Advanced Practice Providers (APPs -  Physician Assistants and Nurse Practitioners) who all work together to provide you with the care you need, when you need it.  We recommend signing up for the patient portal called "MyChart".  Sign up information is provided on this After Visit Summary.  MyChart is used to connect with patients for Virtual Visits (Telemedicine).  Patients are able to view lab/test results, encounter notes, upcoming appointments, etc.  Non-urgent messages can be sent to your provider as well.   To learn more about what you can do with MyChart, go to [email or web address].    Your next appointment:   12 month(s)  Provider:   You may see Lorine Bears, MD or one of the following Advanced Practice Providers on your designated Care Team:   Nicolasa Ducking, NP Eula Listen, PA-C Cadence Fransico Michael, PA-C Charlsie Quest, NP

## 2022-08-28 NOTE — Progress Notes (Signed)
Cardiology Office Note   Date:  08/28/2022   ID:  Curtis Singh, DOB Jul 07, 1955, MRN [id number]  PCP:  Corky Downs, MD  Cardiologist:   Lorine Bears, MD  Chief Complaint  Patient presents with   12 month follow up     "Doing well." Medications reviewed by the patient verbally.        History of Present Illness: Curtis Singh is a 67 y.o. male who is here today for follow-up visit regarding coronary artery disease.   He has known history of diabetes mellitus on insulin, hyperlipidemia, essential hypertension, aortic atherosclerosis and strong family history of coronary artery disease. He is not a smoker. He was seen in December of 2021 with unstable angina.   Cardiac catheterization showed 99% stenosis in the proximal/mid RCA and no other obstructive disease.  Ejection fraction and LVEDP were both normal.  I performed successful angioplasty and drug-eluting stent placement to the right coronary artery.  He underwent left knee replacement in December of 2022.  However, he continued to struggle with range of motion in spite of physical therapy.  Thus, he underwent left knee manipulation with steroid injection in March of 2023.  He retired recently and has been feeling great.  He denies chest pain or shortness of breath.  He went to a concert last night and stayed late.  It   Past Medical History:  Diagnosis Date   Aortic atherosclerosis (HCC)    Coronary artery disease 12/2019   a.) LHC 12/16/2019: EF 55-65%, LVEDP norm. 99% p-mRCA, 20% mRCA. --> PCI placing a 3.0 x 22 mm Resolute Onyx DES to pRCA.   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    HLD (hyperlipidemia)    Hypertension    Long term current use of antithrombotics/antiplatelets    a.) DAPT (ASA + ticagrelor)   Osteoarthritis    Seasonal allergies    T2DM (type 2 diabetes mellitus) (HCC)     Past Surgical History:  Procedure  Laterality Date   ARTHOSCOPIC ROTAOR CUFF REPAIR Left 02/25/2018   Procedure: LEFT ARTHROSCOPIC ROTATOR CUFF REPAIR; SUBACROMIAL DECOMPRESSION;  Surgeon: Jones Broom, MD;  Location: [facility];  Service: Orthopedics;  Laterality: Left;   CATARACT EXTRACTION W/PHACO Right 09/03/2016   Procedure: CATARACT EXTRACTION PHACO AND INTRAOCULAR LENS PLACEMENT (IOC) RIGHT DIABETIC;  Surgeon: Lockie Mola, MD;  Location: [facility];  Service: Ophthalmology;  Laterality: Right;  DIABETIC   COLONOSCOPY WITH PROPOFOL N/A 08/29/2019   Procedure: COLONOSCOPY WITH PROPOFOL;  Surgeon: Toney Reil, MD;  Location: [facility];  Service: Gastroenterology;  Laterality: N/A;   CORONARY STENT INTERVENTION N/A 12/16/2019   Procedure: CORONARY STENT INTERVENTION (3.0 x 22 mm Resolute Onyx DES to pRCA);  Surgeon: Iran Ouch, MD;  Location: [facility] INVASIVE CV LAB;  Service: Cardiovascular;  Laterality: N/A;   KNEE CLOSED REDUCTION Left 03/06/2021   Procedure: Left knee manipulation and steroid injection;  Surgeon: Christena Flake, MD;  Location: [facility];  Service: Orthopedics;  Laterality: Left;   LEFT HEART CATH AND CORONARY ANGIOGRAPHY N/A 12/16/2019   Procedure: LEFT HEART CATH AND CORONARY ANGIOGRAPHY;  Surgeon: Iran Ouch, MD;  Location: [facility] INVASIVE CV LAB;  Service: Cardiovascular;  Laterality: N/A;   ROTATOR CUFF REPAIR Right 01/07/2007   TOTAL KNEE ARTHROPLASTY Left 12/13/2020   Procedure: TOTAL KNEE ARTHROPLASTY;  Surgeon: Christena Flake, MD;  Location: [facility];  Service: Orthopedics;  Laterality: Left;   VASECTOMY N/A      Current Outpatient Medications  Medication Sig Dispense Refill   acetaminophen (TYLENOL) 500 MG tablet Take 1,000 mg by mouth every 6 (six) hours as needed for moderate pain.     aspirin EC 81 MG tablet Take 81 mg by mouth daily. Swallow whole.     losartan (COZAAR) 25 MG tablet Take 1 tablet (25 mg total) by mouth daily in the afternoon.     metFORMIN  (GLUCOPHAGE) 500 MG tablet Take 500 mg by mouth daily with breakfast.     metoprolol succinate (TOPROL-XL) 50 MG 24 hr tablet TAKE 1 TABLET BY MOUTH DAILY. TAKE WITH OR IMMEDIATELY FOLLOWING A MEAL. 90 tablet 3   Multiple Vitamin (MULTIVITAMIN WITH MINERALS) TABS tablet Take 1 tablet by mouth at bedtime.     omeprazole (PRILOSEC) 20 MG capsule TAKE 1 CAPSULE BY MOUTH DAILY. 90 capsule 3   rosuvastatin (CRESTOR) 40 MG tablet Take 1 tablet (40 mg total) by mouth at bedtime. Please call office to schedule follow up prior to further refills (2nd attempt) 30 tablet 0   Semaglutide,0.25 or 0.5MG /DOS, (OZEMPIC, 0.25 OR 0.5 MG/DOSE,) 2 MG/3ML SOPN Inject 0.5 mg as directed. 3 mL 2   sildenafil (VIAGRA) 100 MG tablet Take 1 tablet (100 mg total) by mouth daily as needed for erectile dysfunction. 30 tablet 0   tadalafil (CIALIS) 5 MG tablet Take 1 tablet (5 mg total) by mouth daily as needed for erectile dysfunction. 30 tablet 0   benzonatate (TESSALON) 100 MG capsule Take 1 capsule (100 mg total) by mouth 3 (three) times daily as needed. (Patient not taking: Reported on 08/28/2022) 30 capsule 0   Blood Glucose Monitoring Suppl (FREESTYLE FREEDOM LITE) w/Device KIT use as directed (Patient not taking: Reported on 08/28/2022) 1 kit 0   fluticasone (FLONASE) 50 MCG/ACT nasal spray Place 2 sprays into both nostrils daily. (Patient not taking: Reported on 08/28/2022) 16 g 0   HYDROcodone-acetaminophen (NORCO) 5-325 MG tablet Take 1-2 tablets by mouth every 6 (six) hours as needed for moderate pain or severe pain. MAXIMUM TOTAL ACETAMINOPHEN DOSE IS 4000 MG PER DAY (Patient not taking: Reported on 06/11/2021) 40 tablet 0   HYDROcodone-acetaminophen (NORCO/VICODIN) 5-325 MG tablet Take 1 tablet by mouth every 8 (eight) hours as needed for Pain (Patient not taking: Reported on 06/11/2021) 21 tablet 0   meloxicam (MOBIC) 15 MG tablet Take 1 tablet (15 mg total) by mouth daily with meals for 2 weeks then as needed. (Patient not  taking: Reported on 08/28/2022) 30 tablet 1   methylPREDNISolone (MEDROL DOSEPAK) 4 MG TBPK tablet take as directed over 6 days (Patient not taking: Reported on 09/03/2021) 21 tablet 0   Semaglutide,0.25 or 0.5MG /DOS, (OZEMPIC, 0.25 OR 0.5 MG/DOSE,) 2 MG/3ML SOPN Inject 0.5 mg into the skin once a week. (Patient not taking: Reported on 08/28/2022) 3 mL 6   sildenafil (VIAGRA) 100 MG tablet Take 1 tablet (100 mg total) by mouth daily. (Patient not taking: Reported on 08/28/2022) 30 tablet 3   testosterone cypionate (DEPOTESTOSTERONE CYPIONATE) 200 MG/ML injection Inject 1 mL (200 mg total) into the muscle every 14 (fourteen) days. (Patient not taking: Reported on 09/03/2021) 10 mL 0   triamcinolone cream (KENALOG) 0.1 % Apply to affected area on legs twice a day (Patient not taking: Reported on 09/03/2021) 30 g 0   UNIFINE PENTIPS 29G X MISC Use to inject insulin daily (Patient not taking: Reported on 08/28/2022)  30 each 6   zolpidem (AMBIEN) 10 MG tablet Take 1 tablet (10 mg total) by mouth at bedtime. (Patient not taking: Reported on 08/28/2022) 30 tablet 2   No current facility-administered medications for this visit.    Allergies:   Latex    Social History:  The patient  reports that he quit smoking about 40 years ago. His smoking use included cigarettes. He has never used smokeless tobacco. He reports current alcohol use of about 24.0 standard drinks of alcohol per week. He reports that he does not use drugs.   Family History:  The patient's family history includes Heart attack in his father, maternal uncle, maternal uncle, maternal uncle, and maternal uncle.    ROS:  Please see the history of present illness.   Otherwise, review of systems are positive for none.   All other systems are reviewed and negative.    PHYSICAL EXAM: VS:  BP (!) 140/80 (BP Location: Left Arm, Patient Position: Sitting, Cuff Size: Normal)   Pulse 70   Ht 5\' 11"  (1.803 m)   Wt 199 lb (90.3 kg)   SpO2 97%   BMI  27.75 kg/m  , BMI Body mass index is 27.75 kg/m. GEN: Well nourished, well developed, in no acute distress  HEENT: normal  Neck: no JVD, carotid bruits, or masses Cardiac: RRR; no  rubs, or gallops,no edema .  1/ 6 systolic murmur in the aortic area Respiratory:  clear to auscultation bilaterally, normal work of breathing GI: soft, nontender, nondistended, + BS MS: no deformity or atrophy  Skin: warm and dry, no rash Neuro:  Strength and sensation are intact Psych: euthymic mood, full affect Radial pulse is normal.  EKG:  EKG  ordered today. EKG showed: Normal sinus rhythm Left axis deviation Nonspecific ST and T wave abnormality When compared with ECG of 22-Apr-2020 13:21, No significant change was found      Recent Labs: 09/04/2021: ALT 34; BUN 20; Creatinine, Ser 0.84; Hemoglobin 15.4; Platelets 272; Potassium 4.1; Sodium 135; TSH 1.319    Lipid Panel    Component Value Date/Time   CHOL 114 09/04/2021 0719   CHOL 239 (H) 01/20/2014 0729   TRIG 118 09/04/2021 0719   TRIG 171 01/20/2014 0729   HDL 51 09/04/2021 0719   HDL 48 01/20/2014 0729   CHOLHDL 2.2 09/04/2021 0719   VLDL 24 09/04/2021 0719   VLDL 34 01/20/2014 0729   LDLCALC 39 09/04/2021 0719   LDLCALC 157 (H) 01/20/2014 0729      Wt Readings from Last 3 Encounters:  08/28/22 199 lb (90.3 kg)  09/03/21 195 lb 3.2 oz (88.5 kg)  06/11/21 193 lb (87.5 kg)        ASSESSMENT AND PLAN:  1.  Coronary artery disease involving native coronary arteries without angina: He is doing very well at the present time with no anginal symptoms.  Continue aspirin indefinitely.  2.  Hyperlipidemia: Continue rosuvastatin 40 mg daily.  Most lipid profile showed an LDL of 39.  I requested follow-up labs.  3.  Essential hypertension: His blood show slightly elevated but he reports staying up late last night and his blood pressure at home has been in the normal range.  4.  Diabetes mellitus: Reports some side effects with  metformin but he is doing well with Ozempic.  I requested hemoglobin A1c with his labs and asked him to schedule a follow-up appointment with his primary care physician.   Disposition:   FU with me in 12  months  Signed, Lorine Bears, MD 08/28/2022 10:25 AM    [facility]

## 2022-08-29 LAB — HEMOGLOBIN A1C
Est. average glucose Bld gHb Est-mCnc: 283 mg/dL
Hgb A1c MFr Bld: 11.5 % — ABNORMAL HIGH (ref 4.8–5.6)

## 2022-08-29 LAB — LIPID PANEL
Chol/HDL Ratio: 2.7 ratio (ref 0.0–5.0)
Cholesterol, Total: 157 mg/dL (ref 100–199)
HDL: 59 mg/dL (ref >39)
LDL Chol Calc (NIH): 68 mg/dL (ref 0–99)
Triglycerides: 183 mg/dL — ABNORMAL HIGH (ref 0–149)
VLDL Cholesterol Cal: 30 mg/dL (ref 5–40)

## 2022-08-29 LAB — COMPREHENSIVE METABOLIC PANEL
ALT: 27 IU/L (ref 0–44)
AST: 20 IU/L (ref 0–40)
Albumin: 4.9 g/dL (ref 3.9–4.9)
Alkaline Phosphatase: 84 IU/L (ref 44–121)
BUN/Creatinine Ratio: 13 (ref 10–24)
BUN: 11 mg/dL (ref 8–27)
Bilirubin Total: 0.5 mg/dL (ref 0.0–1.2)
CO2: 22 mmol/L (ref 20–29)
Calcium: 9.5 mg/dL (ref 8.6–10.2)
Chloride: 103 mmol/L (ref 96–106)
Creatinine, Ser: 0.86 mg/dL (ref 0.76–1.27)
Globulin, Total: 2.2 g/dL (ref 1.5–4.5)
Glucose: 244 mg/dL — ABNORMAL HIGH (ref 70–99)
Potassium: 4.9 mmol/L (ref 3.5–5.2)
Sodium: 141 mmol/L (ref 134–144)
Total Protein: 7.1 g/dL (ref 6.0–8.5)
eGFR: 97 mL/min/{1.73_m2} (ref >59)

## 2022-08-29 LAB — CBC
Hematocrit: 45 % (ref 37.5–51.0)
Hemoglobin: 15.1 g/dL (ref 13.0–17.7)
MCH: 30.6 pg (ref 26.6–33.0)
MCHC: 33.6 g/dL (ref 31.5–35.7)
MCV: 91 fL (ref 79–97)
Platelets: 288 10*3/uL (ref 150–450)
RBC: 4.93 x10E6/uL (ref 4.14–5.80)
RDW: 12.5 % (ref 11.6–15.4)
WBC: 5.9 10*3/uL (ref 3.4–10.8)

## 2022-09-03 ENCOUNTER — Other Ambulatory Visit: Payer: Self-pay

## 2022-09-03 MED ORDER — LOSARTAN POTASSIUM 25 MG PO TABS: 25.0000 mg | ORAL_TABLET | Freq: Every day | ORAL | 3 refills | Status: DC

## 2022-09-03 MED ORDER — METFORMIN HCL 500 MG PO TABS: 500.0000 mg | ORAL_TABLET | Freq: Two times a day (BID) | ORAL | 3 refills | Status: DC

## 2022-09-03 MED ORDER — OZEMPIC (0.25 OR 0.5 MG/DOSE) 2 MG/3ML ~~LOC~~ SOPN: 0.5000 mg | PEN_INJECTOR | SUBCUTANEOUS | 3 refills | Status: DC

## 2022-09-03 MED ORDER — FREESTYLE FREEDOM LITE W/DEVICE KIT
PACK | 3 refills | Status: AC
  Filled 2022-09-03: qty 1, 30d supply, fill #0

## 2022-09-03 MED ORDER — OZEMPIC (1 MG/DOSE) 4 MG/3ML ~~LOC~~ SOPN
1.0000 mg | PEN_INJECTOR | SUBCUTANEOUS | 3 refills | Status: AC
  Filled 2022-09-03: qty 3, 28d supply, fill #0
  Filled 2022-09-09: qty 9, 84d supply, fill #0

## 2022-09-03 MED ORDER — OMEPRAZOLE 20 MG PO CPDR: 20.0000 mg | DELAYED_RELEASE_CAPSULE | Freq: Every day | ORAL | 3 refills | Status: AC

## 2022-09-03 MED ORDER — METOPROLOL SUCCINATE ER 50 MG PO TB24: 50.0000 mg | ORAL_TABLET | Freq: Every day | ORAL | 3 refills | Status: DC

## 2022-09-03 MED ORDER — ROSUVASTATIN CALCIUM 40 MG PO TABS
40.0000 mg | ORAL_TABLET | Freq: Every day | ORAL | 3 refills | Status: AC
  Filled 2022-09-03: qty 90, 90d supply, fill #0

## 2022-09-03 MED ORDER — SILDENAFIL CITRATE 100 MG PO TABS: 100.0000 mg | ORAL_TABLET | Freq: Every day | ORAL | 3 refills | Status: DC

## 2022-09-04 ENCOUNTER — Other Ambulatory Visit: Payer: Self-pay

## 2022-09-05 ENCOUNTER — Other Ambulatory Visit: Payer: Self-pay

## 2022-09-05 MED ORDER — SILDENAFIL CITRATE 100 MG PO TABS
100.0000 mg | ORAL_TABLET | Freq: Every day | ORAL | 3 refills | Status: DC
  Filled 2022-09-05: qty 6, 30d supply, fill #0
  Filled 2022-09-05: qty 90, 90d supply, fill #0

## 2022-09-05 MED ORDER — METFORMIN HCL 500 MG PO TABS
500.0000 mg | ORAL_TABLET | Freq: Two times a day (BID) | ORAL | 3 refills | Status: DC
  Filled 2022-09-05: qty 180, 90d supply, fill #0

## 2022-09-05 MED ORDER — ROSUVASTATIN CALCIUM 40 MG PO TABS
40.0000 mg | ORAL_TABLET | Freq: Every day | ORAL | 3 refills | Status: DC
  Filled 2022-09-05: qty 90, 90d supply, fill #0

## 2022-09-05 MED ORDER — METOPROLOL SUCCINATE ER 50 MG PO TB24
50.0000 mg | ORAL_TABLET | Freq: Every day | ORAL | 3 refills | Status: DC
  Filled 2022-09-05: qty 90, 90d supply, fill #0

## 2022-09-05 MED ORDER — LOSARTAN POTASSIUM 25 MG PO TABS
25.0000 mg | ORAL_TABLET | Freq: Every day | ORAL | 3 refills | Status: DC
  Filled 2022-09-05: qty 90, 90d supply, fill #0

## 2022-09-05 MED ORDER — OZEMPIC (1 MG/DOSE) 4 MG/3ML ~~LOC~~ SOPN
1.0000 mg | PEN_INJECTOR | SUBCUTANEOUS | 3 refills | Status: AC
  Filled 2022-09-05 (×2): qty 3, 28d supply, fill #0

## 2022-09-05 MED ORDER — OMEPRAZOLE 20 MG PO CPDR
20.0000 mg | DELAYED_RELEASE_CAPSULE | Freq: Every day | ORAL | 3 refills | Status: AC
  Filled 2022-09-05: qty 90, 90d supply, fill #0

## 2022-09-09 ENCOUNTER — Other Ambulatory Visit: Payer: Self-pay

## 2022-09-25 DIAGNOSIS — Z7984 Long term (current) use of oral hypoglycemic drugs: Secondary | ICD-10-CM | POA: Diagnosis not present

## 2022-09-25 DIAGNOSIS — I10 Essential (primary) hypertension: Secondary | ICD-10-CM | POA: Diagnosis not present

## 2022-09-25 DIAGNOSIS — Z8249 Family history of ischemic heart disease and other diseases of the circulatory system: Secondary | ICD-10-CM | POA: Diagnosis not present

## 2022-09-25 DIAGNOSIS — Z823 Family history of stroke: Secondary | ICD-10-CM | POA: Diagnosis not present

## 2022-09-25 DIAGNOSIS — I2510 Atherosclerotic heart disease of native coronary artery without angina pectoris: Secondary | ICD-10-CM | POA: Diagnosis not present

## 2022-09-25 DIAGNOSIS — Z809 Family history of malignant neoplasm, unspecified: Secondary | ICD-10-CM | POA: Diagnosis not present

## 2022-09-25 DIAGNOSIS — E785 Hyperlipidemia, unspecified: Secondary | ICD-10-CM | POA: Diagnosis not present

## 2022-09-25 DIAGNOSIS — N529 Male erectile dysfunction, unspecified: Secondary | ICD-10-CM | POA: Diagnosis not present

## 2022-09-25 DIAGNOSIS — Z833 Family history of diabetes mellitus: Secondary | ICD-10-CM | POA: Diagnosis not present

## 2022-09-25 DIAGNOSIS — Z87891 Personal history of nicotine dependence: Secondary | ICD-10-CM | POA: Diagnosis not present

## 2022-09-25 DIAGNOSIS — E119 Type 2 diabetes mellitus without complications: Secondary | ICD-10-CM | POA: Diagnosis not present

## 2022-09-25 DIAGNOSIS — K219 Gastro-esophageal reflux disease without esophagitis: Secondary | ICD-10-CM | POA: Diagnosis not present

## 2022-11-25 IMAGING — MR MR KNEE*L* W/O CM
6 series · 40 of 40 positions shown · non-contrast
Comparison: None.

CLINICAL DATA: Ongoing left knee pain. No known injury or prior
relevant surgery. History of osteoarthritis.

EXAM:
MRI OF THE LEFT KNEE WITHOUT CONTRAST
TECHNIQUE: Multiplanar, multisequence MR imaging of the knee was performed. No
intravenous contrast was administered.

[Series 3: T2 fat-sat · axial · 4.0mm · 0.50mm/px · z∈[-77,+93]mm · 8 of 35 slices shown (1 of 3)]
[im 1/35]
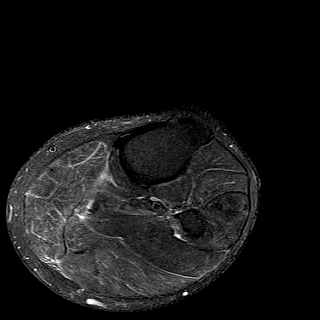
[im 5/35]
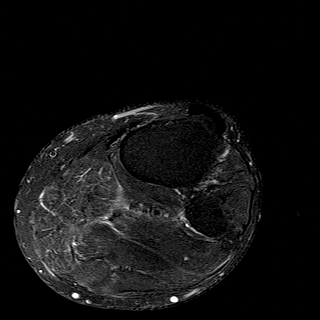
[im 10/35]
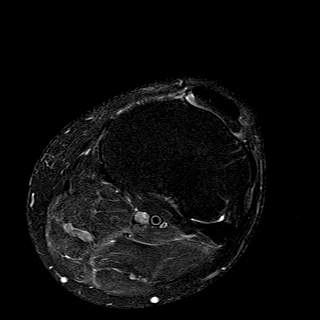
[im 15/35]
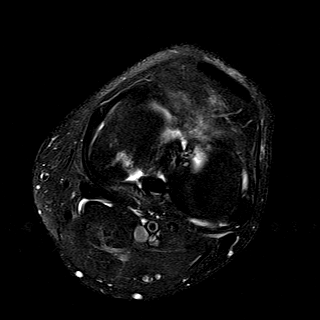
[im 20/35]
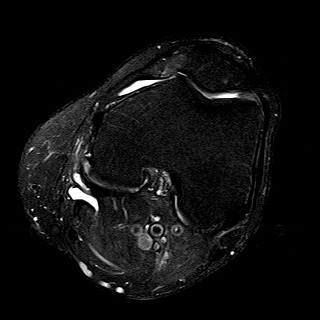
[im 25/35]
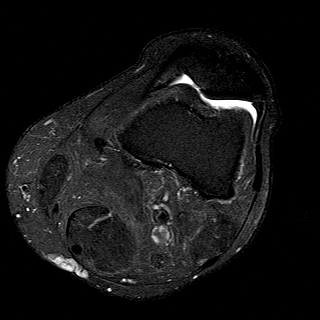
[im 30/35]
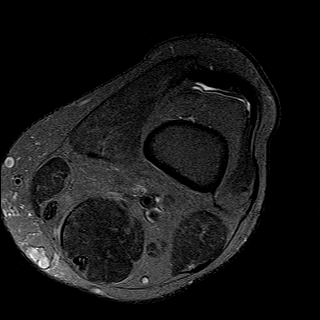
[im 35/35]
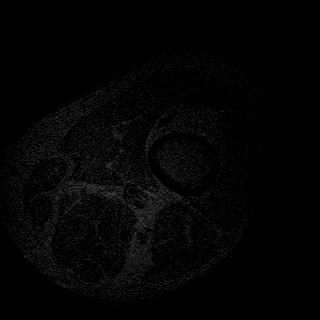

[Series 4: T1 · coronal · 4.0mm · 0.59mm/px · 6 of 31 slices shown]
[im 1/31]
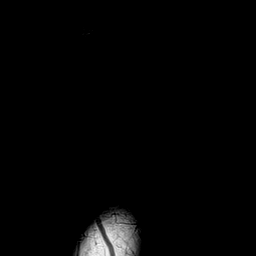
[im 7/31]
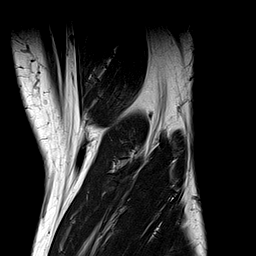
[im 13/31]
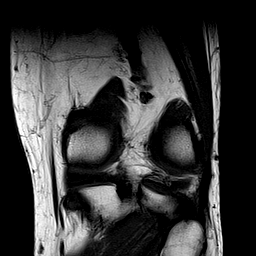
[im 19/31]
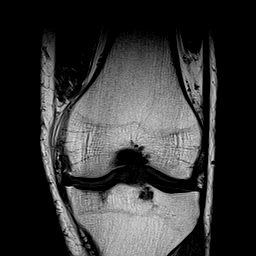
[im 25/31]
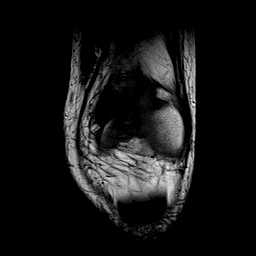
[im 31/31]
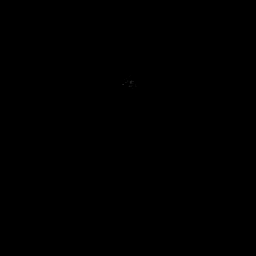

[Series 5: PD fat-sat · sagittal · 3.0mm · 0.59mm/px · 7 of 35 slices shown (1 of 2)]
[im 1/35]
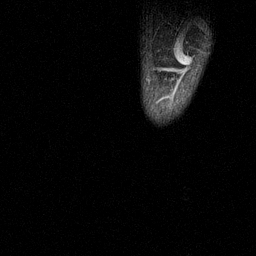
[im 6/35]
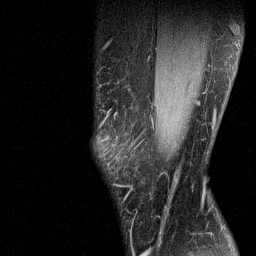
[im 12/35]
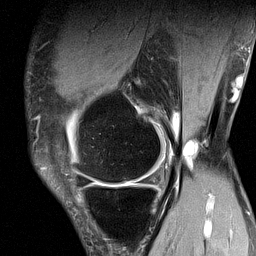
[im 18/35]
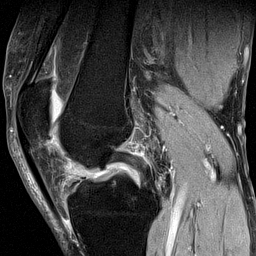
[im 23/35]
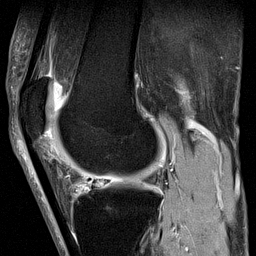
[im 29/35]
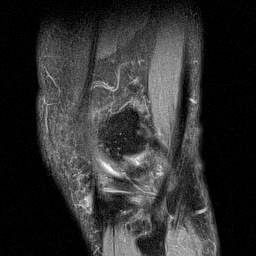
[im 35/35]
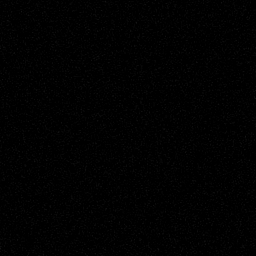

[Series 6: T2 fat-sat · coronal · 4.0mm · 0.59mm/px · 6 of 31 slices shown (2 of 3)]
[im 1/31]
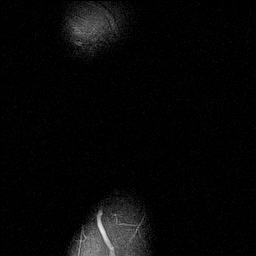
[im 7/31]
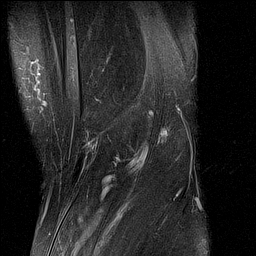
[im 13/31]
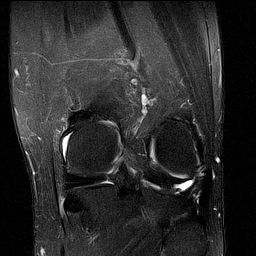
[im 19/31]
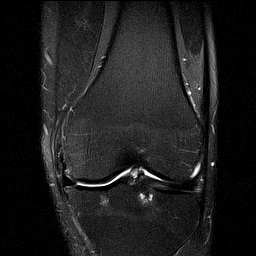
[im 25/31]
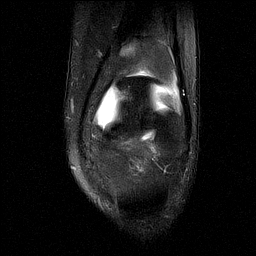
[im 31/31]
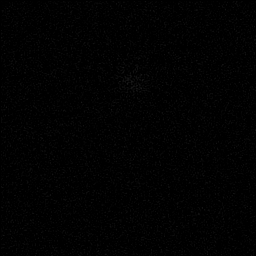

[Series 7: PD fat-sat · coronal · 4.0mm · 0.59mm/px · 6 of 31 slices shown (2 of 2)]
[im 1/31]
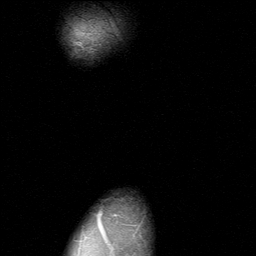
[im 7/31]
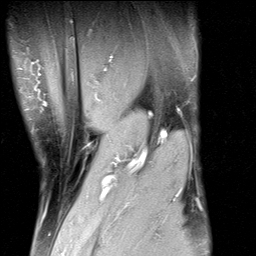
[im 13/31]
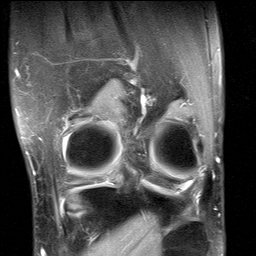
[im 19/31]
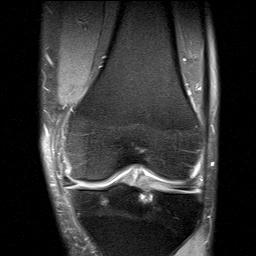
[im 25/31]
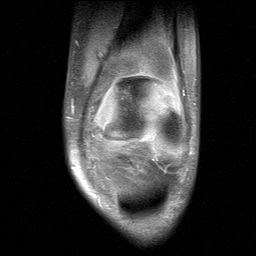
[im 31/31]
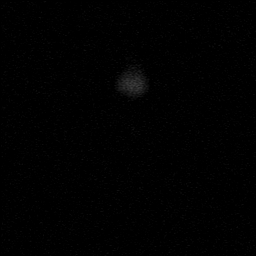

[Series 8: T2 fat-sat · sagittal · 3.0mm · 0.59mm/px · 7 of 35 slices shown (3 of 3)]
[im 1/35]
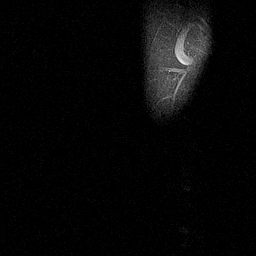
[im 6/35]
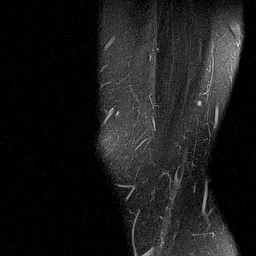
[im 12/35]
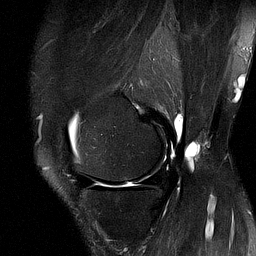
[im 18/35]
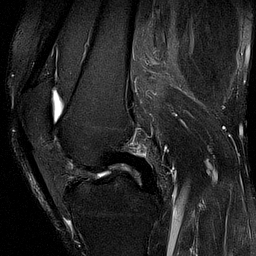
[im 23/35]
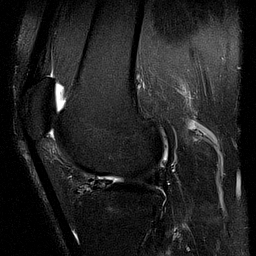
[im 29/35]
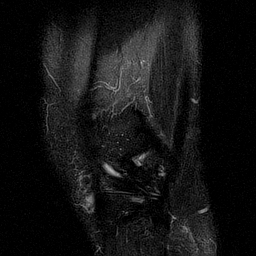
[im 35/35]
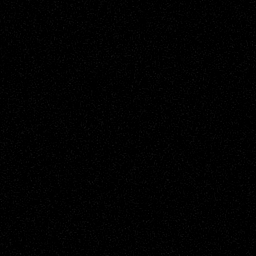

[40 of 40 positions shown; findings below may reference images not displayed]

FINDINGS: MENISCI

Medial meniscus: Irregular degenerative tear of the posterior horn
near the meniscal root. There is resulting mild peripheral extrusion
of the meniscus from the joint space. No centrally displaced
meniscal fragment. Degenerative signal is present within the
meniscal body.

Lateral meniscus: Degenerative signal in the anterior horn without
evidence of tear or displaced meniscal fragment.

LIGAMENTS

Cruciates:  Intact.

Collaterals:  Intact.

CARTILAGE

Patellofemoral: Diffuse chondral thinning over the medial patellar
facet and apex. There is scattered subchondral cyst formation in the
central and medial trochlea.

Medial: Full-thickness chondral defect over the posterior aspect of
the medial femoral condyle, measuring 8 mm on coronal image [DATE].
Mild associated subchondral cyst formation. Diffuse chondral
thinning and surface irregularity throughout the medial compartment
with mild subchondral cyst formation centrally in the medial tibial
plateau.

Lateral:  Preserved.

MISCELLANEOUS

Joint:  Small joint effusion.

Popliteal Fossa:  Small Baker's cyst.

Extensor Mechanism:  Intact.

Bones:  No acute or significant extra-articular osseous findings.

Other: Small superficial varicosities posteriorly.
IMPRESSION: 1. Degenerative tear of the posterior horn of the medial meniscus
near the meniscal root. Mild resulting peripheral extrusion of the
meniscus from the joint space.
2. Moderate medial and patellofemoral compartment degenerative
changes. No acute osseous findings.
3. Small joint effusion and small Baker's cyst.
4. The lateral meniscus, cruciate and collateral ligaments are
intact.

## 2022-12-01 DIAGNOSIS — I499 Cardiac arrhythmia, unspecified: Secondary | ICD-10-CM | POA: Diagnosis not present

## 2022-12-01 DIAGNOSIS — H938X9 Other specified disorders of ear, unspecified ear: Secondary | ICD-10-CM | POA: Diagnosis not present

## 2022-12-01 DIAGNOSIS — E119 Type 2 diabetes mellitus without complications: Secondary | ICD-10-CM | POA: Diagnosis not present

## 2022-12-02 DIAGNOSIS — E119 Type 2 diabetes mellitus without complications: Secondary | ICD-10-CM | POA: Diagnosis not present

## 2022-12-19 ENCOUNTER — Telehealth: Payer: Self-pay | Admitting: Cardiovascular Disease

## 2022-12-19 NOTE — Telephone Encounter (Signed)
Patient is returning phone call.  °

## 2022-12-19 NOTE — Telephone Encounter (Signed)
Patient c/o Palpitations:  High priority if patient c/o lightheadedness, shortness of breath, or chest pain  How long have you had palpitations/irregular HR/ Afib? Are you having the symptoms now?  Palpitations that he can hear for the past 3-4 weeks. No symptoms currently  Are you currently experiencing lightheadedness, SOB or CP?  No   Do you have a history of afib (atrial fibrillation) or irregular heart rhythm?  No   Have you checked your BP or HR? (document readings if available):  140/79   Are you experiencing any other symptoms?  Chest discomfort for 3-4 weeks. Patient denies pain

## 2022-12-19 NOTE — Telephone Encounter (Signed)
Left a message for the patient to call back.  

## 2022-12-19 NOTE — Telephone Encounter (Signed)
The patient called, stating that for the past 3-4 weeks, he has noticed that when he's sitting watching TV or lying in bed, he can feel and hear his heart beating. He stated it doesn't feel rapid, but he mentioned, 'I can hear and feel it more.' The patient also reported occasional slight chest pain during this time. He noted that when he's active, he doesn't experience any issues. The patient expressed concern because he has a history of a prior stent.  Appointment scheduled for 12/22/22.  Pt made aware of ED precaution should any new symptoms develop or worsen.

## 2022-12-22 ENCOUNTER — Ambulatory Visit: Payer: Medicare Other

## 2022-12-22 ENCOUNTER — Ambulatory Visit: Payer: Medicare Other | Attending: Cardiology | Admitting: Cardiology

## 2022-12-22 ENCOUNTER — Other Ambulatory Visit: Payer: Self-pay

## 2022-12-22 ENCOUNTER — Encounter: Payer: Self-pay | Admitting: Cardiology

## 2022-12-22 VITALS — BP 136/78 | HR 70 | Ht 71.0 in | Wt 199.0 lb

## 2022-12-22 DIAGNOSIS — E119 Type 2 diabetes mellitus without complications: Secondary | ICD-10-CM | POA: Diagnosis not present

## 2022-12-22 DIAGNOSIS — R002 Palpitations: Secondary | ICD-10-CM

## 2022-12-22 DIAGNOSIS — R079 Chest pain, unspecified: Secondary | ICD-10-CM

## 2022-12-22 DIAGNOSIS — I10 Essential (primary) hypertension: Secondary | ICD-10-CM

## 2022-12-22 DIAGNOSIS — I2510 Atherosclerotic heart disease of native coronary artery without angina pectoris: Secondary | ICD-10-CM | POA: Diagnosis not present

## 2022-12-22 DIAGNOSIS — E785 Hyperlipidemia, unspecified: Secondary | ICD-10-CM

## 2022-12-22 MED ORDER — METOPROLOL SUCCINATE ER 50 MG PO TB24
ORAL_TABLET | ORAL | 3 refills | Status: DC
  Filled 2022-12-22: qty 135, 90d supply, fill #0

## 2022-12-22 NOTE — Patient Instructions (Signed)
Medication Instructions:   metoprolol succinate (TOPROL-XL) 50 MG 24 hr tablet - Take 0.5 tablet (25 mg total) in the morning and take 1 tablet (50 mg) in the evening  *If you need a refill on your cardiac medications before your next appointment, please call your pharmacy*  Lab Work: - None ordered  Testing/Procedures:    Please report to Radiology at the [facility] Main Entrance 30 minutes early for your test.  [address] [place], [place] [address]                         OR   Please report to Radiology at [facility] Main Entrance, medical mall, 30 mins prior to your test.  [address]  [place], [place]  [phone]  How to Prepare for Your Cardiac PET/CT Stress Test:  Nothing to eat or drink, except water, 3 hours prior to arrival time.  NO caffeine/decaffeinated products, or chocolate 12 hours prior to arrival. (Please note decaffeinated beverages (teas/coffees) still contain caffeine).  If you have caffeine within 12 hours prior, the test will need to be rescheduled.  Medication instructions: Do not take erectile dysfunction medications for 72 hours prior to test (sildenafil, tadalafil) Do not take nitrates (isosorbide mononitrate, Ranexa) the day before or day of test Do not take tamsulosin the day before or morning of test Hold theophylline containing medications for 12 hours. Hold Dipyridamole 48 hours prior to the test.  Diabetic Preparation: If able to eat breakfast prior to 3 hour fasting, you may take all medications, including your insulin. Do not worry if you miss your breakfast dose of insulin - start at your next meal. If you do not eat prior to 3 hour fast-Hold all diabetes (oral and insulin) medications. Patients who wear a continuous glucose monitor MUST remove the device prior to scanning.  You may take your remaining medications with water.  NO perfume, cologne or lotion on chest or abdomen area. FEMALES  - Please avoid wearing dresses to this appointment.  Total time is 1 to 2 hours; you may want to bring reading material for the waiting time.  IF YOU THINK YOU MAY BE PREGNANT, OR ARE NURSING PLEASE INFORM THE TECHNOLOGIST.  In preparation for your appointment, medication and supplies will be purchased.  Appointment availability is limited, so if you need to cancel or reschedule, please call the Radiology Department at [phone] [facility]) OR [phone] [facility]) 24 hours in advance to avoid a cancellation fee of $100.00  What to Expect When you Arrive:  Once you arrive and check in for your appointment, you will be taken to a preparation room within the Radiology Department.  A technologist or Nurse will obtain your medical history, verify that you are correctly prepped for the exam, and explain the procedure.  Afterwards, an IV will be started in your arm and electrodes will be placed on your skin for EKG monitoring during the stress portion of the exam. Then you will be escorted to the PET/CT scanner.  There, staff will get you positioned on the scanner and obtain a blood pressure and EKG.  During the exam, you will continue to be connected to the EKG and blood pressure machines.  A small, safe amount of a radioactive tracer will be injected in your IV to obtain a series of pictures of your heart along with an injection of a stress agent.    After your Exam:  It is recommended that you eat a meal and drink a caffeinated beverage to counter act any effects of the stress agent.  Drink plenty of fluids for the remainder of the day and urinate frequently for the first couple of hours after the exam.  Your doctor will inform you of your test results within 7-10 business days.  For more information and frequently asked questions, please visit our website: [email or web address]  For questions about your test or how to prepare for your test, please call: Cardiac Imaging Nurse  Navigators Office: [phone]    Heart Monitor:  Your physician has requested you wear a ZIO XT patch heart monitor for 14 days.  Your monitor will be mailed to your home address within 3-5 business days. This is sent via [employer] from [employer]. However, if you have not received your monitor after 5 business days please send [place] a MyChart message or call the office at [phone], so we may follow up on this for you.   This monitor is a medical device (single patch monitor) that records the heart's electrical activity. Doctors most often use these monitors to diagnose arrhythmias. Arrhythmias are problems with the speed or rhythm of the heartbeat.   [employer] supplies 1 patch per enrollment. Additional stickers are not available.  YouTube search Zio patch heart monitor 3:46 for step-by-step application instructions Please DO NOT apply the patch if you will be having a Nuclear Stress Test, Echocardiogram, Cardiac CT, Cardiac MRI, Chest X-ray during the period you would be wearing the monitor. The patch cannot be worn during these tests.  You cannot remove and re-apply the ZIO patch monitor.   Applying the Monitor: Once you receive your monitor, this will include a small razor, abrader, and 4 alcohol pads. Shave hair from upper left chest Rub abrader disc in 40 strokes over the left upper chest as indicated in your monitor instructions Clean area with 4 enclosed alcohol pads (there may be a mild & brief stinging sensation over the newly abraded area, but this is normal). Let dry Apply patch as indicated in monitor instructions. Patch will be placed under collarbone on the left side of the chest with arrow pointing upward. Rub adhesive wings for 2 minutes. Remove white label marked "1". Remove the white label marked "2". Rub patch adhesive wings for an 2 minutes.  While looking in a mirror, press and release button in the center of the patch. You may hear a "click". A small green  light will flash 4-6 times and then stop. This will be your indicator that the monitor has been turned on.  Wearing the Monitor: Avoid showering during the first 24 hours of wearing the monitor.  After 24 hours you may shower with the patch on. Take brief showers with your back facing the shower head.  Avoid excessive sweating to help maximize wear time. Do not submerge the device, no hot tubs, and no swimming pools. Keep any lotions or oils away from the patch. Press the button if you feel a symptom. You will hear a small click. Record date, time, and symptoms in the Patient Logbook or App.  Monitor Issues: Call [employer] Customer Care at [phone] if you have questions regarding your Zio Patch Monitor. Call them immediately if you see an orange/ amber colored light blinking on your monitor. If your monitor falls off and you cannot get this reapplied or if you need suggestions for securing your monitor call [employer] at [phone].   Returning  the Monitor: Once you have completed wearing your monitor, follow instructions on the last 2 pages of the Patient Logbook. Stick monitor patch on to the last page of the Patient Logbook.  Place Patient Logbook with monitor in the return box provided. Use locking tab on box and tape box closed securely. The return box has pre-paid postage on it.  Place the return box in the regular [place] Mail box as soon as possible It will take anywhere from 1-2 weeks for your provider to receive and review your results once you mail this back. If for some reason you have misplaced your return box then call our office and we can provide another box and/or mail it off for you.   Billing  and Patient Assistance Program Information: We have supplied [employer] with any of your insurance information on file for billing purposes. [employer] offers a sliding scale Patient Assistance Program for patients that do not have insurance, or whose insurance does not  completely cover the cost of the ZIO monitor. You must apply for the Patient Assistance Program to qualify for this discounted rate. To apply, please call [employer] at [phone], select option 1, ask to apply for the Patient Assistance Program. [employer] will ask your household income, and how many people are in your household. They will quote your out-of-pocket cost based on that information. [employer] will also be able to set up for a 67-month, interest-free payment plan if needed.     Follow-Up: At [facility], you and your health needs are our priority.  As part of our continuing mission to provide you with exceptional heart care, we have created designated Provider Care Teams.  These Care Teams include your primary Cardiologist (physician) and Advanced Practice Providers (APPs -  Physician Assistants and Nurse Practitioners) who all work together to provide you with the care you need, when you need it.  Your next appointment:   After testing is completed  Provider:   Charlsie Quest, NP    Other Instructions Do not wear Zio patch heart monitor during cardiac PET

## 2022-12-22 NOTE — Progress Notes (Signed)
Cardiology Office Note:  .   Date:  12/22/2022  ID:  Curtis Singh, DOB Dec 26, 1955, MRN [id number] PCP: Corky Downs, MD  [facility] Providers Cardiologist:  Lorine Bears, MD    History of Present Illness: Curtis Singh   Curtis Singh is a 67 y.o. male with past medical history of coronary artery disease, diabetes, hyperlipidemia, central pretension, aortic atherosclerosis, who is here today for follow-up with concerns of chest pain and palpitations.   He previously been seen in December 2021 with complaints of unstable angina.  Cardiac catheterization at that time revealed 99% stenosis in the proximal/mid RCA with no other obstructive disease noted.  Ejection fraction and LVEDP were both normal.  He underwent successful PCI/DES to the RCA.  Underwent left knee replacement in December 2022 however he continued to struggle with range of motion despite physical therapy.  He underwent knee manipulation with steroid injection in March 2023.   He was last seen in clinic 08/28/2022 by Dr.Arida.  At that time he had recently retired and was feeling well.  There were no medication changes that were made and no further testing that was ordered at that time.  He called into the triage line on 12/19/2022 with complaints of palpitations.  He stated that he was able to hear his heart for the past 3 to 4 weeks.  He also had chest discomfort for the past 3 to 4 weeks.  Return appointment to the office was made at that time.  He returns to clinic today with complaints of palpitations that he has been hearing in his right ear when he lies on his right side at night.  He also has associated chest discomfort that feels like his heart is beating hard in his chest.  This has been ongoing over the last several weeks.  He denies any shortness of breath or peripheral edema.  He states that he has been under an increased amount of stress as of late.  He denies any increased caffeine but does state that he is overly fatigued,  stress with building onto their current establishment, and not sleeping well.  He states that he has been compliant with his current medication regimen.  Denies any hospitalizations or visits to the emergency department.  Does have concerns today with his hemoglobin A1c jumping up to 11.5.  ROS: 10 point review of system has been reviewed and considered negative with exception was been listed in the HPI  Studies Reviewed: Curtis Singh   EKG Interpretation Date/Time:  Monday December 22 2022 15:36:02 EST Ventricular Rate:  70 PR Interval:  190 QRS Duration:  84 QT Interval:  400 QTC Calculation: 432 R Axis:   -32  Text Interpretation: Normal sinus rhythm Left axis deviation When compared with ECG of 28-Aug-2022 10:20, No significant change was found Confirmed by Charlsie Quest ([id number]) on 12/22/2022 3:39:51 PM    [facility] 12/16/2019 Prox RCA to Mid RCA lesion is 99% stenosed. Post intervention, there is a 0% residual stenosis. A drug-eluting stent was successfully placed using a STENT RESOLUTE ONYX 3.0X22. Mid RCA lesion is 20% stenosed. The left ventricular systolic function is normal. LV end diastolic pressure is normal. The left ventricular ejection fraction is 55-65% by visual estimate.   1.  Severe one-vessel coronary artery disease with 99% thrombotic stenosis in proximal/mid RCA just before a tortuous segment.  No significant disease affecting the left coronary arteries. 2.  Normal LV systolic function and normal left ventricular end-diastolic pressure 3.  Successful angioplasty  and drug-eluting stent placement to the right coronary artery.   Recommendations: Dual antiplatelet therapy with aspirin and ticagrelor for at least 12 months. Aggressive treatment of risk factors and cardiac rehab. The patient is a candidate for same-day discharge.   Lexiscan Myoview 03/03/2019 Pharmacological myocardial perfusion imaging study with no significant  ischemia Normal wall motion, EF estimated at 60% No  EKG changes concerning for ischemia at peak stress or in recovery. Resting EKG with nonspecific ST/T wave abnormality precordial leads, inferior leads CT attenuation corrected images with mild aortic atherosclerosis, mild mid RCA coronary calcification Low risk scan Risk Assessment/Calculations:             Physical Exam:   VS:  BP 136/78   Pulse 70   Ht 5\' 11"  (1.803 m)   Wt 199 lb (90.3 kg)   SpO2 96%   BMI 27.75 kg/m    Wt Readings from Last 3 Encounters:  12/22/22 199 lb (90.3 kg)  08/28/22 199 lb (90.3 kg)  09/03/21 195 lb 3.2 oz (88.5 kg)    GEN: Well nourished, well developed in no acute distress NECK: No JVD; No carotid bruits CARDIAC: RRR, no murmurs, rubs, gallops RESPIRATORY:  Clear to auscultation without rales, wheezing or rhonchi  ABDOMEN: Soft, non-tender, non-distended EXTREMITIES:  No edema; No deformity   ASSESSMENT AND PLAN: .   Atypical chest pain with a history of coronary artery disease with stent placement in 2021.  States that he has been having atypical chest pain with associated palpitations for the past 3 to 4 weeks.  Hide does not typically on exertion and feels like his heart is just beating against his chest wall and extremely hard.  States that he has been compliant with his current medications.  EKG today reveals sinus sinus rhythm with a rate of 70 with a left axis deviation with no ischemic changes noted.  He has been continued on aspirin 81 mg daily and rosuvastatin 40 mg daily.  With concerns for chest pain and stent placement in 2021 with no further testing he has been scheduled for cardiac PET stress.  Palpitations that been ongoing for several weeks.  EKG does show a shows no arrhythmia or PACs or PVCs.  His Toprol-XL has been increased to 50 mg p.m. 25 mg in the a.m.  He is also being scheduled for a ZIO XT monitor for 14 days to rule out any underlying arrhythmia.  Primary hypertension with blood pressure today 136/78.  Blood pressure remained  stable.  He is continued on losartan 25 mg daily, Toprol-XL 50 mg in p.m. 25 mg in the a.m. he is also encouraged to continue to monitor his blood pressures 1 to 2 hours postmedication administration at home.  Mixed hyperlipidemia where he has an LDL of 68.  Goal is less than 70 but ideally less than 55.  He is continued on rosuvastatin 40 mg daily but encouraged to avoid increased alcohol intake as he states he is drinking more beer than prior.  Type 2 diabetes with last hemoglobin A1c of 11.5.  He is continued on metformin and Ozempic with working on decreasing his A1c.  This continues to be managed by his PCP.    Informed Consent   Shared Decision Making/Informed Consent The risks [chest pain, shortness of breath, cardiac arrhythmias, dizziness, blood pressure fluctuations, myocardial infarction, stroke/transient ischemic attack, nausea, vomiting, allergic reaction, radiation exposure, metallic taste sensation and life-threatening complications (estimated to be 1 in 10,000)], benefits (risk stratification, diagnosing coronary  artery disease, treatment guidance) and alternatives of a cardiac PET stress test were discussed in detail with Mr. Saline and he agrees to proceed.     Dispo: Patient to return to clinic to see MD/APP once testing is completed or sooner if needed  Signed, Marga Gramajo, NP

## 2023-01-05 DIAGNOSIS — R079 Chest pain, unspecified: Secondary | ICD-10-CM

## 2023-01-06 ENCOUNTER — Other Ambulatory Visit: Payer: Self-pay

## 2023-01-08 DIAGNOSIS — H47011 Ischemic optic neuropathy, right eye: Secondary | ICD-10-CM | POA: Diagnosis not present

## 2023-01-08 DIAGNOSIS — H04123 Dry eye syndrome of bilateral lacrimal glands: Secondary | ICD-10-CM | POA: Diagnosis not present

## 2023-01-08 DIAGNOSIS — E119 Type 2 diabetes mellitus without complications: Secondary | ICD-10-CM | POA: Diagnosis not present

## 2023-01-08 DIAGNOSIS — H2512 Age-related nuclear cataract, left eye: Secondary | ICD-10-CM | POA: Diagnosis not present

## 2023-01-12 DIAGNOSIS — N529 Male erectile dysfunction, unspecified: Secondary | ICD-10-CM | POA: Diagnosis not present

## 2023-01-12 DIAGNOSIS — K859 Acute pancreatitis without necrosis or infection, unspecified: Secondary | ICD-10-CM | POA: Diagnosis not present

## 2023-01-12 DIAGNOSIS — K080 Exfoliation of teeth due to systemic causes: Secondary | ICD-10-CM | POA: Diagnosis not present

## 2023-01-12 DIAGNOSIS — H938X9 Other specified disorders of ear, unspecified ear: Secondary | ICD-10-CM | POA: Diagnosis not present

## 2023-01-12 DIAGNOSIS — K219 Gastro-esophageal reflux disease without esophagitis: Secondary | ICD-10-CM | POA: Diagnosis not present

## 2023-01-13 ENCOUNTER — Other Ambulatory Visit (HOSPITAL_COMMUNITY): Payer: Self-pay

## 2023-01-14 ENCOUNTER — Telehealth (HOSPITAL_COMMUNITY): Payer: Self-pay | Admitting: *Deleted

## 2023-01-14 NOTE — Telephone Encounter (Signed)
 Patient returning call about his upcoming cardiac imaging study; pt verbalizes understanding of appt date/time, parking situation and where to check in, pre-test NPO status; name and call back number provided for further questions should they arise  Larey Brick RN Navigator Cardiac Imaging [facility] [phone] office [phone] cell  Patient aware to avoid caffeine 12 hours prior to his cardiac PET study.

## 2023-01-14 NOTE — Telephone Encounter (Signed)
Attempted to call patient regarding upcoming cardiac PET appointment. Left message on voicemail with name and callback number  Valeria Krisko RN Navigator Cardiac Imaging [facility] [phone] Office [phone] Cell  Reminder to avoid caffeine 12 hours prior to his cardiac PET scan. 

## 2023-01-15 ENCOUNTER — Ambulatory Visit
Admission: RE | Admit: 2023-01-15 | Discharge: 2023-01-15 | Disposition: A | Discharge status: Home / Self Care | Payer: Medicare Other | Source: Ambulatory Visit | Attending: Cardiology | Admitting: Cardiology

## 2023-01-15 DIAGNOSIS — I25118 Atherosclerotic heart disease of native coronary artery with other forms of angina pectoris: Secondary | ICD-10-CM | POA: Diagnosis not present

## 2023-01-15 DIAGNOSIS — I700 Atherosclerosis of aorta: Secondary | ICD-10-CM | POA: Diagnosis not present

## 2023-01-15 DIAGNOSIS — R079 Chest pain, unspecified: Secondary | ICD-10-CM | POA: Diagnosis not present

## 2023-01-15 DIAGNOSIS — K760 Fatty (change of) liver, not elsewhere classified: Secondary | ICD-10-CM | POA: Diagnosis not present

## 2023-01-15 LAB — NM PET CT CARDIAC PERFUSION MULTI W/ABSOLUTE BLOODFLOW
MBFR: 2.61
Nuc Rest EF: 22 %
Nuc Stress EF: 39 %
Peak HR: 88 {beats}/min
Rest HR: 60 {beats}/min
Rest MBF: 0.71 ml/g/min
Rest Nuclear Isotope Dose: 22.2 mCi
SRS: 0
SSS: 1
ST Depression (mm): 0 mm
Stress MBF: 1.85 ml/g/min
Stress Nuclear Isotope Dose: 22.2 mCi
TID: 1.02

## 2023-01-15 MED ORDER — RUBIDIUM RB82 GENERATOR (RUBYFILL)
25.0000 | PACK | Freq: Once | INTRAVENOUS | Status: AC
  Administered 2023-01-15: 22.21 via INTRAVENOUS

## 2023-01-15 MED ORDER — REGADENOSON 0.4 MG/5ML IV SOLN
0.4000 mg | Freq: Once | INTRAVENOUS | Status: AC
  Administered 2023-01-15: 0.4 mg via INTRAVENOUS
  Filled 2023-01-15: qty 5

## 2023-01-15 MED ORDER — REGADENOSON 0.4 MG/5ML IV SOLN
INTRAVENOUS | Status: AC
  Filled 2023-01-15: qty 5

## 2023-01-15 MED ORDER — RUBIDIUM RB82 GENERATOR (RUBYFILL)
25.0000 | PACK | Freq: Once | INTRAVENOUS | Status: AC
  Administered 2023-01-15: 22.16 via INTRAVENOUS

## 2023-01-15 NOTE — Progress Notes (Signed)
 Patient presents for a cardiac PET stress test and tolerated procedure without incident. Patient maintained acceptable vital signs throughout the test and was offered caffeine after test.  Patient ambulated out of department with a steady gait.

## 2023-01-16 ENCOUNTER — Other Ambulatory Visit: Payer: Self-pay | Admitting: *Deleted

## 2023-01-16 ENCOUNTER — Other Ambulatory Visit: Payer: Self-pay | Admitting: Internal Medicine

## 2023-01-16 DIAGNOSIS — R109 Unspecified abdominal pain: Secondary | ICD-10-CM

## 2023-01-16 DIAGNOSIS — K859 Acute pancreatitis without necrosis or infection, unspecified: Secondary | ICD-10-CM

## 2023-01-16 DIAGNOSIS — R079 Chest pain, unspecified: Secondary | ICD-10-CM

## 2023-01-16 NOTE — Progress Notes (Signed)
 Findings were consistent with normal perfusion.  There was no ischemia and no infarct that was noted.  Heart squeeze was noted to be reduced on study.  Recommend follow-up with an echocardiogram to evaluate heart function.

## 2023-01-19 ENCOUNTER — Ambulatory Visit
Admission: RE | Admit: 2023-01-19 | Discharge: 2023-01-19 | Disposition: A | Discharge status: Home / Self Care | Payer: Medicare Other | Source: Ambulatory Visit | Attending: Internal Medicine | Admitting: Internal Medicine

## 2023-01-19 DIAGNOSIS — K859 Acute pancreatitis without necrosis or infection, unspecified: Secondary | ICD-10-CM | POA: Diagnosis not present

## 2023-01-19 DIAGNOSIS — K861 Other chronic pancreatitis: Secondary | ICD-10-CM | POA: Diagnosis not present

## 2023-01-19 DIAGNOSIS — R109 Unspecified abdominal pain: Secondary | ICD-10-CM | POA: Diagnosis not present

## 2023-01-19 DIAGNOSIS — K838 Other specified diseases of biliary tract: Secondary | ICD-10-CM | POA: Diagnosis not present

## 2023-01-19 DIAGNOSIS — K760 Fatty (change of) liver, not elsewhere classified: Secondary | ICD-10-CM | POA: Diagnosis not present

## 2023-01-21 ENCOUNTER — Encounter: Payer: Self-pay | Admitting: Ophthalmology

## 2023-01-27 DIAGNOSIS — H2512 Age-related nuclear cataract, left eye: Secondary | ICD-10-CM | POA: Diagnosis not present

## 2023-01-30 DIAGNOSIS — R079 Chest pain, unspecified: Secondary | ICD-10-CM | POA: Diagnosis not present

## 2023-02-02 NOTE — Discharge Instructions (Signed)

## 2023-02-04 ENCOUNTER — Other Ambulatory Visit: Payer: Self-pay

## 2023-02-04 ENCOUNTER — Encounter
Admission: RE | Disposition: A | Discharge status: Home / Self Care | Payer: Self-pay | Source: Ambulatory Visit | Attending: Ophthalmology

## 2023-02-04 ENCOUNTER — Ambulatory Visit: Payer: Medicare Other | Admitting: Anesthesiology

## 2023-02-04 ENCOUNTER — Ambulatory Visit
Admission: RE | Admit: 2023-02-04 | Discharge: 2023-02-04 | Disposition: A | Discharge status: Home / Self Care | Payer: Medicare Other | Source: Ambulatory Visit | Attending: Ophthalmology | Admitting: Ophthalmology

## 2023-02-04 ENCOUNTER — Encounter: Payer: Self-pay | Admitting: Ophthalmology

## 2023-02-04 DIAGNOSIS — Z950 Presence of cardiac pacemaker: Secondary | ICD-10-CM | POA: Diagnosis not present

## 2023-02-04 DIAGNOSIS — Z7984 Long term (current) use of oral hypoglycemic drugs: Secondary | ICD-10-CM | POA: Diagnosis not present

## 2023-02-04 DIAGNOSIS — E1136 Type 2 diabetes mellitus with diabetic cataract: Secondary | ICD-10-CM | POA: Diagnosis not present

## 2023-02-04 DIAGNOSIS — M1990 Unspecified osteoarthritis, unspecified site: Secondary | ICD-10-CM | POA: Diagnosis not present

## 2023-02-04 DIAGNOSIS — H5703 Miosis: Secondary | ICD-10-CM | POA: Insufficient documentation

## 2023-02-04 DIAGNOSIS — Z87891 Personal history of nicotine dependence: Secondary | ICD-10-CM | POA: Insufficient documentation

## 2023-02-04 DIAGNOSIS — Z955 Presence of coronary angioplasty implant and graft: Secondary | ICD-10-CM | POA: Diagnosis not present

## 2023-02-04 DIAGNOSIS — K219 Gastro-esophageal reflux disease without esophagitis: Secondary | ICD-10-CM | POA: Insufficient documentation

## 2023-02-04 DIAGNOSIS — I10 Essential (primary) hypertension: Secondary | ICD-10-CM | POA: Insufficient documentation

## 2023-02-04 DIAGNOSIS — H2512 Age-related nuclear cataract, left eye: Secondary | ICD-10-CM | POA: Insufficient documentation

## 2023-02-04 DIAGNOSIS — I2510 Atherosclerotic heart disease of native coronary artery without angina pectoris: Secondary | ICD-10-CM | POA: Insufficient documentation

## 2023-02-04 DIAGNOSIS — Z7985 Long-term (current) use of injectable non-insulin antidiabetic drugs: Secondary | ICD-10-CM | POA: Insufficient documentation

## 2023-02-04 DIAGNOSIS — Z7982 Long term (current) use of aspirin: Secondary | ICD-10-CM | POA: Insufficient documentation

## 2023-02-04 HISTORY — PX: CATARACT EXTRACTION W/PHACO: SHX586

## 2023-02-04 LAB — GLUCOSE, CAPILLARY: Glucose-Capillary: 216 mg/dL — ABNORMAL HIGH (ref 70–99)

## 2023-02-04 SURGERY — PHACOEMULSIFICATION, CATARACT, WITH IOL INSERTION
Anesthesia: Monitor Anesthesia Care | Laterality: Left

## 2023-02-04 MED ORDER — SIGHTPATH DOSE#1 BSS IO SOLN
INTRAOCULAR | Status: DC | PRN
  Administered 2023-02-04: 76 mL via OPHTHALMIC

## 2023-02-04 MED ORDER — CEFUROXIME OPHTHALMIC INJECTION 1 MG/0.1 ML
INJECTION | OPHTHALMIC | Status: DC | PRN
  Administered 2023-02-04: .1 mL via INTRACAMERAL

## 2023-02-04 MED ORDER — FENTANYL CITRATE (PF) 100 MCG/2ML IJ SOLN
INTRAMUSCULAR | Status: DC | PRN
  Administered 2023-02-04 (×2): 50 ug via INTRAVENOUS

## 2023-02-04 MED ORDER — SIGHTPATH DOSE#1 BSS IO SOLN
INTRAOCULAR | Status: DC | PRN
  Administered 2023-02-04: 15 mL via INTRAOCULAR

## 2023-02-04 MED ORDER — TETRACAINE HCL 0.5 % OP SOLN
1.0000 [drp] | OPHTHALMIC | Status: DC | PRN
Start: 2023-02-04 — End: 2023-02-04
  Administered 2023-02-04 (×3): 1 [drp] via OPHTHALMIC

## 2023-02-04 MED ORDER — FENTANYL CITRATE (PF) 100 MCG/2ML IJ SOLN
INTRAMUSCULAR | Status: AC
  Filled 2023-02-04: qty 2

## 2023-02-04 MED ORDER — SIGHTPATH DOSE#1 BSS IO SOLN
INTRAOCULAR | Status: DC | PRN
  Administered 2023-02-04: 1 mL

## 2023-02-04 MED ORDER — MIDAZOLAM HCL 2 MG/2ML IJ SOLN
INTRAMUSCULAR | Status: AC
  Filled 2023-02-04: qty 2

## 2023-02-04 MED ORDER — TETRACAINE HCL 0.5 % OP SOLN
OPHTHALMIC | Status: AC
  Filled 2023-02-04: qty 4

## 2023-02-04 MED ORDER — BRIMONIDINE TARTRATE-TIMOLOL 0.2-0.5 % OP SOLN
OPHTHALMIC | Status: DC | PRN
  Administered 2023-02-04: 1 [drp] via OPHTHALMIC

## 2023-02-04 MED ORDER — SIGHTPATH DOSE#1 NA HYALUR & NA CHOND-NA HYALUR IO KIT
PACK | INTRAOCULAR | Status: DC | PRN
  Administered 2023-02-04: 1 via OPHTHALMIC

## 2023-02-04 MED ORDER — ARMC OPHTHALMIC DILATING DROPS
1.0000 | OPHTHALMIC | Status: DC | PRN
  Administered 2023-02-04 (×3): 1 via OPHTHALMIC

## 2023-02-04 MED ORDER — ARMC OPHTHALMIC DILATING DROPS
OPHTHALMIC | Status: AC
Start: 2023-02-04 — End: ?
  Filled 2023-02-04: qty 0.5

## 2023-02-04 MED ORDER — MIDAZOLAM HCL 2 MG/2ML IJ SOLN
INTRAMUSCULAR | Status: DC | PRN
  Administered 2023-02-04: 2 mg via INTRAVENOUS

## 2023-02-04 SURGICAL SUPPLY — 8 items
CATARACT SUITE SIGHTPATH (MISCELLANEOUS) ×1
FEE CATARACT SUITE SIGHTPATH (MISCELLANEOUS) ×1 IMPLANT
GLOVE SRG 8 PF TXTR STRL LF DI (GLOVE) ×1 IMPLANT
GLOVE SURG ENC TEXT LTX SZ7.5 (GLOVE) ×1 IMPLANT
LENS IOL TECNIS EYHANCE 20.5 (Intraocular Lens) IMPLANT
NDL FILTER BLUNT 18X1 1/2 (NEEDLE) ×1 IMPLANT
NEEDLE FILTER BLUNT 18X1 1/2 (NEEDLE) ×1
SYR 3ML LL SCALE MARK (SYRINGE) ×1 IMPLANT

## 2023-02-04 NOTE — Op Note (Signed)
OPERATIVE NOTE  Curtis Singh [id number] 02/04/2023  PREOPERATIVE DIAGNOSIS:   Nuclear sclerotic cataract left eye with miotic pupil      H25.12   POSTOPERATIVE DIAGNOSIS:   Nuclear sclerotic cataract left eye with miotic pupil.     PROCEDURE:  Phacoemulsification with posterior chamber intraocular lens implantation of the left eye which required pupil stretching with the Malyugin pupil expansion device  Ultrasound time: Procedure(s): CATARACT EXTRACTION PHACO AND INTRAOCULAR LENS PLACEMENT (IOC) LEFT MALYUGIN DIABETIC (Left) [place] time: 42 seconds.  CDE 9.15  LENS:  * No implants in log *   DIB00 20.5 D PCIOL     SURGEON:  Deirdre Evener, MD   ANESTHESIA: Topical with tetracaine drops and 2% Xylocaine jelly, augmented with 1% preservative-free intracameral lidocaine.   COMPLICATIONS:  None.   DESCRIPTION OF PROCEDURE:  The patient was identified in the holding room and transported to the operating room and placed in the supine position under the operating microscope.  The left eye was identified as the operative eye and it was prepped and draped in the usual sterile ophthalmic fashion.   A 1 millimeter clear-corneal paracentesis was made at the 1:30 position.  The anterior chamber was filled with Viscoat viscoelastic.  0.5 ml of preservative-free 1% lidocaine was injected into the anterior chamber.  A 2.4 millimeter keratome was used to make a near-clear corneal incision at the 10:30 position.  A Malyugin pupil expander was then placed through the main incision and into the anterior chamber of the eye.  The edge of the iris was secured on the lip of the pupil expander and it was released, thereby expanding the pupil to approximately 7 millimeters for completion of the cataract surgery.  Additional Viscoat was placed in the anterior chamber.  A cystotome and capsulorrhexis forceps were used to make a curvilinear capsulorrhexis.   Balanced salt solution was used to hydrodissect and  hydrodelineate the lens nucleus.   Phacoemulsification was used in stop and chop fashion to remove the lens, nucleus and epinucleus.  The remaining cortex was aspirated using the irrigation aspiration handpiece.  Additional Provisc was placed into the eye to distend the capsular bag for lens placement.  A lens was then injected into the capsular bag.  The pupil expanding ring was removed using a Kuglen hook and insertion device. The remaining viscoelastic was aspirated from the capsular bag and the anterior chamber.  The anterior chamber was filled with balanced salt solution to inflate to a physiologic pressure.   Wounds were hydrated with balanced salt solution.  The anterior chamber was inflated to a physiologic pressure with balanced salt solution.  No wound leaks were noted. Cefuroxime 0.1 ml of a 10mg /ml solution was injected into the anterior chamber for a dose of 1 mg of intracameral antibiotic at the completion of the case.   Timolol and Brimonidine drops were applied to the eye.  The patient was taken to the recovery room in stable condition without complications of anesthesia or surgery.  [name] 02/04/2023, 8:43 AM

## 2023-02-04 NOTE — Anesthesia Preprocedure Evaluation (Signed)
Anesthesia Evaluation  Patient identified by MRN, date of birth, ID band Patient awake    Reviewed: Allergy & Precautions, H&P , NPO status , Patient's Chart, lab work & pertinent test results  Airway Mallampati: II  TM Distance: >3 FB Neck ROM: Full    Dental no notable dental hx.    Pulmonary neg pulmonary ROS, former smoker   Pulmonary exam normal breath sounds clear to auscultation       Cardiovascular hypertension, + angina  + CAD  Normal cardiovascular exam+ pacemaker  Rhythm:Regular Rate:Normal  12-16-19 heart cath and drug-eluting stents    Prox RCA to Mid RCA lesion is 99% stenosed.  Post intervention, there is a 0% residual stenosis.  A drug-eluting stent was successfully placed using a STENT RESOLUTE ONYX 3.0X22.  Mid RCA lesion is 20% stenosed.  The left ventricular systolic function is normal.  LV end diastolic pressure is normal.  The left ventricular ejection fraction is 55-65% by visual estimate.   1.  Severe one-vessel coronary artery disease with 99% thrombotic stenosis in proximal/mid RCA just before a tortuous segment.  No significant disease affecting the left coronary arteries. 2.  Normal LV systolic function and normal left ventricular end-diastolic pressure 3.  Successful angioplasty and drug-eluting stent placement to the right coronary artery.   Recommendations: Dual antiplatelet therapy with aspirin and ticagrelor for at least 12 months. Aggressive treatment of risk factors and cardiac rehab. The patient is a candidate for same-day discharge.    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 Hypertension T2DM (type 2 diabetes mellitus) (HCC)GERD (gastroesophageal reflux disease) Coronary artery disease  HLD (hyperlipidemia) Osteoarthritis  Long term current use of antithrombotics/antiplatelets Aortic atherosclerosis (HCC)      Reproductive/Obstetrics negative OB ROS                              Anesthesia Physical Anesthesia Plan  ASA: 3  Anesthesia Plan: MAC   Post-op Pain Management:    Induction: Intravenous  PONV Risk Score and Plan:   Airway Management Planned: Natural Airway and Nasal Cannula  Additional Equipment:   Intra-op Plan:   Post-operative Plan:   Informed Consent: I have reviewed the patients History and Physical, chart, labs and discussed the procedure including the risks, benefits and alternatives for the proposed anesthesia with the patient or authorized representative who has indicated his/her understanding and acceptance.     Dental Advisory Given  Plan Discussed with: Anesthesiologist, CRNA and Surgeon  Anesthesia Plan Comments: (Patient consented for risks of anesthesia including but not limited to:  - adverse reactions to medications - damage to eyes, teeth, lips or other oral mucosa - nerve damage due to positioning  - sore throat or hoarseness - Damage to heart, brain, nerves, lungs, other parts of body or loss of life  Patient voiced understanding and assent.)         Anesthesia Quick Evaluation

## 2023-02-04 NOTE — Transfer of Care (Signed)
Immediate Anesthesia Transfer of Care Note  Patient: Curtis Singh  Procedure(s) Performed: CATARACT EXTRACTION PHACO AND INTRAOCULAR LENS PLACEMENT (IOC) LEFT MALYUGIN DIABETIC (Left)  Patient Location: PACU  Anesthesia Type: MAC  Level of Consciousness: awake, alert  and patient cooperative  Airway and Oxygen Therapy: Patient Spontanous Breathing and Patient connected to supplemental oxygen  Post-op Assessment: Post-op Vital signs reviewed, Patient's Cardiovascular Status Stable, Respiratory Function Stable, Patent Airway and No signs of Nausea or vomiting  Post-op Vital Signs: Reviewed and stable  Complications: No notable events documented.

## 2023-02-04 NOTE — H&P (Signed)
[facility]   Primary Care Physician:  Corky Downs, MD Ophthalmologist: Dr. Lockie Mola  Pre-Procedure History & Physical: HPI:  Curtis Singh is a 68 y.o. male here for ophthalmic surgery.   Past Medical History:  Diagnosis Date   Aortic atherosclerosis (HCC)    Coronary artery disease 12/2019   a.) LHC 12/16/2019: EF 55-65%, LVEDP norm. 99% p-mRCA, 20% mRCA. --> PCI placing a 3.0 x 22 mm Resolute Onyx DES to pRCA.   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    HLD (hyperlipidemia)    Hypertension    Long term current use of antithrombotics/antiplatelets    a.) DAPT (ASA + ticagrelor)   Osteoarthritis    Seasonal allergies    T2DM (type 2 diabetes mellitus) (HCC)     Past Surgical History:  Procedure Laterality Date   ARTHOSCOPIC ROTAOR CUFF REPAIR Left 02/25/2018   Procedure: LEFT ARTHROSCOPIC ROTATOR CUFF REPAIR; SUBACROMIAL DECOMPRESSION;  Surgeon: Jones Broom, MD;  Location: [facility];  Service: Orthopedics;  Laterality: Left;   CATARACT EXTRACTION W/PHACO Right 09/03/2016   Procedure: CATARACT EXTRACTION PHACO AND INTRAOCULAR LENS PLACEMENT (IOC) RIGHT DIABETIC;  Surgeon: Lockie Mola, MD;  Location: [facility];  Service: Ophthalmology;  Laterality: Right;  DIABETIC   COLONOSCOPY WITH PROPOFOL N/A 08/29/2019   Procedure: COLONOSCOPY WITH PROPOFOL;  Surgeon: Toney Reil, MD;  [facility];  Service: Gastroenterology;  Laterality: N/A;   CORONARY STENT INTERVENTION N/A 12/16/2019   Procedure: CORONARY STENT INTERVENTION (3.0 x 22 mm Resolute Onyx DES to pRCA);  Surgeon: Iran Ouch, MD;  Location: [facility] INVASIVE CV LAB;  Service: Cardiovascular;  Laterality: N/A;   KNEE CLOSED REDUCTION Left 03/06/2021   Procedure: Left knee manipulation and steroid injection;  Surgeon: Christena Flake, MD;  Location: [facility] ORS;  Service: Orthopedics;  Laterality:  Left;   LEFT HEART CATH AND CORONARY ANGIOGRAPHY N/A 12/16/2019   Procedure: LEFT HEART CATH AND CORONARY ANGIOGRAPHY;  Surgeon: Iran Ouch, MD;  Location: [facility] INVASIVE CV LAB;  Service: Cardiovascular;  Laterality: N/A;   ROTATOR CUFF REPAIR Right 01/07/2007   TOTAL KNEE ARTHROPLASTY Left 12/13/2020   Procedure: TOTAL KNEE ARTHROPLASTY;  Surgeon: Christena Flake, MD;  Location: [facility] ORS;  Service: Orthopedics;  Laterality: Left;   VASECTOMY N/A     Prior to Admission medications   Medication Sig Start Date End Date Taking? Authorizing Provider  acetaminophen (TYLENOL) 500 MG tablet Take 1,000 mg by mouth every 6 (six) hours as needed for moderate pain.   Yes [provider]  aspirin EC 81 MG tablet Take 81 mg by mouth daily. Swallow whole.   Yes [provider]  Blood Glucose Monitoring Suppl (FREESTYLE FREEDOM LITE) w/Device KIT Use as directed two times daily 09/03/22  Yes   losartan (COZAAR) 25 MG tablet Take 1 tablet (25mg ) by mouth once daily. 09/03/22  Yes   metFORMIN (GLUCOPHAGE) 500 MG tablet Take 1 tablet (500 mg total) by mouth 2 (two) times daily. 09/05/22  Yes   metoprolol succinate (TOPROL-XL) 50 MG 24 hr tablet Take 0.5 tablets (25 mg total) by mouth every morning AND 1 tablet (50 mg total) every evening. 12/22/22  Yes Hammock, Lavonna Rua, NP  omeprazole (PRILOSEC) 20 MG capsule Take 1 capsule (20 mg total) by mouth daily. 09/05/22  Yes   rosuvastatin (CRESTOR) 40 MG tablet Take 1 tablet (40mg ) by mouth daily 09/03/22  Yes   Semaglutide, 1 MG/DOSE, (OZEMPIC, 1 MG/DOSE,) 4 MG/3ML SOPN Inject 1 mg into the skin once a week. 09/03/22  Yes   sildenafil (VIAGRA) 100 MG tablet Take 1 tablet (100 mg total) by mouth daily as needed for erectile dysfunction. 10/22/20  Yes McGowan, Carollee Herter A, PA-C  zolpidem (AMBIEN) 10 MG tablet Take 10 mg by mouth at bedtime as needed for sleep.   Yes [provider]  losartan (COZAAR) 25 MG tablet Take 1 tablet (25 mg total) by mouth  daily in the afternoon. 03/06/21 08/28/22  Poggi, Excell Seltzer, MD  losartan (COZAAR) 25 MG tablet Take 1 tablet (25 mg total) by mouth daily. Patient not taking: Reported on 12/22/2022 09/05/22     metFORMIN (GLUCOPHAGE) 500 MG tablet Take 500 mg by mouth daily with breakfast. Patient not taking: Reported on 12/22/2022    [provider]  metFORMIN (GLUCOPHAGE) 500 MG tablet Take 1 tablet (500mg ) by mouth two times daily. Patient not taking: Reported on 12/22/2022 09/03/22     Multiple Vitamin (MULTIVITAMIN WITH MINERALS) TABS tablet Take 1 tablet by mouth at bedtime. Patient not taking: Reported on 01/21/2023    [provider]  omeprazole (PRILOSEC) 20 MG capsule TAKE 1 CAPSULE BY MOUTH DAILY. 06/25/21 08/28/22  Corky Downs, MD  omeprazole (PRILOSEC) 20 MG capsule Take 1 capsule by mouth daily. Patient not taking: Reported on 12/22/2022 09/03/22     rosuvastatin (CRESTOR) 40 MG tablet 1 tab by mouth daily Patient not taking: Reported on 12/22/2022 09/05/22     Semaglutide, 1 MG/DOSE, (OZEMPIC, 1 MG/DOSE,) 4 MG/3ML SOPN Inject 1 mg into the skin once a week. Patient not taking: Reported on 12/22/2022 09/05/22     sildenafil (VIAGRA) 100 MG tablet Take 1 tablet (100 mg total) by mouth daily. Patient not taking: Reported on 12/22/2022 04/11/22     sildenafil (VIAGRA) 100 MG tablet Take 1 tablet by mouth daily. Patient not taking: Reported on 12/22/2022 09/03/22     sildenafil (VIAGRA) 100 MG tablet Take 1 tablet (100 mg total) by mouth daily. Patient not taking: Reported on 12/22/2022 09/05/22     tadalafil (CIALIS) 5 MG tablet Take 1 tablet (5 mg total) by mouth daily as needed for erectile dysfunction. Patient not taking: Reported on 12/22/2022 11/26/21   Michiel Cowboy A, PA-C    Allergies as of 01/12/2023 - Review Complete 12/22/2022  Allergen Reaction Noted   Latex Rash 08/29/2019    Family History  Problem Relation Age of Onset   Heart attack Father    Heart attack Maternal  Uncle    Heart attack Maternal Uncle    Heart attack Maternal Uncle    Heart attack Maternal Uncle    Colon cancer Neg Hx    Esophageal cancer Neg Hx    Rectal cancer Neg Hx    Stomach cancer Neg Hx    Kidney cancer Neg Hx    Kidney disease Neg Hx    Prostate cancer Neg Hx    Urolithiasis Neg Hx     Social History   Socioeconomic History   Marital status: Divorced    Spouse name: Not on file   Number of children: Not on file   Years of education: Not on file   Highest education level: Not on file  Occupational History   Not on file  Tobacco Use   Smoking status: Former    Current packs/day: 0.00    Types: Cigarettes    Quit date: 01/06/1982  Years since quitting: 41.1   Smokeless tobacco: Never  Vaping Use   Vaping status: Never Used  Substance and Sexual Activity   Alcohol use: Yes    Alcohol/week: 24.0 standard drinks of alcohol    Types: 24 Cans of beer per week    Comment: 1 case on weekend   Drug use: No   Sexual activity: Not on file  Other Topics Concern   Not on file  Social History Narrative   Not on file   Social Drivers of Health   Financial Resource Strain: Not on file  Food Insecurity: Not on file  Transportation Needs: Not on file  Physical Activity: Not on file  Stress: Not on file  Social Connections: Not on file  Intimate Partner Violence: Not on file    Review of Systems: See HPI, otherwise negative ROS  Physical Exam: BP 135/82   Pulse 69   Temp 97.9 F (36.6 C) (Temporal)   Resp 18   Ht 6' (1.829 m)   Wt 86.6 kg   SpO2 98%   BMI 25.90 kg/m  General:   Alert,  pleasant and cooperative in NAD Head:  Normocephalic and atraumatic. Lungs:  Clear to auscultation.    Heart:  Regular rate and rhythm.   Impression/Plan: Curtis Singh is here for ophthalmic surgery.  Risks, benefits, limitations, and alternatives regarding ophthalmic surgery have been reviewed with the patient.  Questions have been answered.  All parties  agreeable.   Lockie Mola, MD  02/04/2023, 8:02 AM

## 2023-02-04 NOTE — Anesthesia Postprocedure Evaluation (Signed)
Anesthesia Post Note  Patient: Curtis Singh  Procedure(s) Performed: CATARACT EXTRACTION PHACO AND INTRAOCULAR LENS PLACEMENT (IOC) LEFT MALYUGIN DIABETIC 9.15, 00:42.0 (Left)  Patient location during evaluation: PACU Anesthesia Type: MAC Level of consciousness: awake and alert Pain management: pain level controlled Vital Signs Assessment: post-procedure vital signs reviewed and stable Respiratory status: spontaneous breathing, nonlabored ventilation, respiratory function stable and patient connected to nasal cannula oxygen Cardiovascular status: stable and blood pressure returned to baseline Postop Assessment: no apparent nausea or vomiting Anesthetic complications: no   No notable events documented.   Last Vitals:  Vitals:   02/04/23 0844 02/04/23 0849  BP: 118/73 136/80  Pulse: 64 64  Resp: 14 14  Temp: (!) 36.2 C   SpO2: 96% 95%    Last Pain:  Vitals:   02/04/23 0849  TempSrc:   PainSc: 0-No pain                 Marisue Humble

## 2023-02-05 ENCOUNTER — Encounter: Payer: Self-pay | Admitting: Ophthalmology

## 2023-02-10 ENCOUNTER — Ambulatory Visit: Payer: Medicare Other | Attending: Cardiology

## 2023-03-02 ENCOUNTER — Ambulatory Visit: Payer: Medicare Other | Attending: Cardiology | Admitting: Cardiology

## 2023-03-02 NOTE — Progress Notes (Deleted)
 Cardiology Office Note:  .   Date:  03/02/2023  ID:  Curtis Singh, DOB [birth year], MRN [id number] PCP: Curtis Downs, MD  [facility] Providers Cardiologist:  Curtis Bears, MD { Click to update primary MD,subspecialty MD or APP then REFRESH:1}   History of Present Illness: Curtis Singh Kitchen   Curtis Singh is a 68 y.o. male with a past medical history of coronary artery disease, type 2 diabetes, hyperlipidemia, essential hypertension, aortic atherosclerosis, who presents today for follow-up on his coronary artery disease.   He previously been seen in December 2021 with complaints of unstable angina.  Cardiac catheterization at that time revealed 99% stenosis in the proximal/mid RCA with no other obstructive disease noted.  Ejection fraction and LVEDP were both normal.  He underwent successful PCI/DES to the RCA.  Underwent left knee replacement in December 2022 however he continued to struggle with range of motion despite physical therapy.  He underwent knee manipulation with steroid injection in March 2023.   He was last seen in clinic 12/22/2022 with complaints of palpitations that he has been hearing in his right ear when he lies down in the bed at night.  He also had associated chest discomfort and feels like his heart is beating hard in her chest.  He states has been ongoing over the last several weeks.  He was scheduled for cardiac PET stress, echocardiogram, as well as a ZIO XT monitor to rule out arrhythmias.  He returns to clinic today  ROS: 10 point review of systems has been reviewed and considered negative except what is been listed in the HPI  Studies Reviewed: Curtis Singh Kitchen      Event Monitor (Zio) 02/02/23 Patient had a min HR of 31 bpm, max HR of 138 bpm, and avg HR of 75 bpm. Predominant underlying rhythm was Sinus Rhythm. 3 Supraventricular Tachycardia runs occurred, the run with the fastest interval lasting 6 beats with a max rate of 138 bpm, the  longest lasting 8 beats with an avg rate of  105 bpm. 1 episode(s) of AV Block (2nd) occurred, lasting a total of 2 secs. Rare PACs and rare PVCs.  cPET stress 01/15/2023    Findings are consistent with normal perfusion (no ischemia and no infarction).   LVEF is reduced in rest and stress though gating appears abnormal. The study is at least intermediate risk due to this.  Consider echocardiogram.   LV perfusion is normal.   Rest left ventricular function is abnormal. Rest global function is severely reduced. Rest EF: 22%. Stress left ventricular function is abnormal. Stress global function is moderately reduced. Stress EF: 39%. End diastolic cavity size is normal. End systolic cavity size is normal.   Myocardial blood flow was computed to be 0.36ml/g/min at rest and 1.49ml/g/min at stress. Global myocardial blood flow reserve was 2.61 and was normal.   Coronary calcium assessment not performed due to prior revascularization. Aortic atherosclerosis noted.   Electronically Signed  By: Curtis Singh M.D.   [facility] 12/16/2019 Prox RCA to Mid RCA lesion is 99% stenosed. Post intervention, there is a 0% residual stenosis. A drug-eluting stent was successfully placed using a STENT RESOLUTE ONYX 3.0X22. Mid RCA lesion is 20% stenosed. The left ventricular systolic function is normal. LV end diastolic pressure is normal. The left ventricular ejection fraction is 55-65% by visual estimate.   1.  Severe one-vessel coronary artery disease with 99% thrombotic stenosis in proximal/mid RCA just before a tortuous segment.  No significant disease affecting the left  coronary arteries. 2.  Normal LV systolic function and normal left ventricular end-diastolic pressure 3.  Successful angioplasty and drug-eluting stent placement to the right coronary artery.   Recommendations: Dual antiplatelet therapy with aspirin and ticagrelor for at least 12 months. Aggressive treatment of risk factors and cardiac rehab. The patient is a candidate for same-day  discharge.   Lexiscan Myoview 03/03/2019 Pharmacological myocardial perfusion imaging study with no significant  ischemia Normal wall motion, EF estimated at 60% No EKG changes concerning for ischemia at peak stress or in recovery. Resting EKG with nonspecific ST/T wave abnormality precordial leads, inferior leads CT attenuation corrected images with mild aortic atherosclerosis, mild mid RCA coronary calcification Low risk scan Risk Assessment/Calculations:     No BP recorded.  {Refresh Note OR Click here to enter BP  :1}***       Physical Exam:   VS:  There were no vitals taken for this visit.   Wt Readings from Last 3 Encounters:  02/04/23 191 lb (86.6 kg)  01/15/23 188 lb (85.3 kg)  12/22/22 199 lb (90.3 kg)    GEN: Well nourished, well developed in no acute distress NECK: No JVD; No carotid bruits CARDIAC: ***RRR, no murmurs, rubs, gallops RESPIRATORY:  Clear to auscultation without rales, wheezing or rhonchi  ABDOMEN: Soft, non-tender, non-distended EXTREMITIES:  No edema; No deformity   ASSESSMENT AND PLAN: .   Coronary artery disease of native coronary arteries with stable angina Palpitations Primary hypertension Hyperlipidemia Type 2 diabetes    {Are you ordering a CV Procedure (e.g. stress test, cath, DCCV, TEE, etc)?   Press F2        :[id number]}  Dispo: ***  Signed, Curtis Dyar, NP

## 2023-03-05 IMAGING — DX DG KNEE 1-2V PORT*L*
2 series · 2 of 2 positions shown · non-contrast
Comparison: Knee MRI 09/04/2020

CLINICAL DATA: Postop left total knee replacement

EXAM:
PORTABLE LEFT KNEE - 1-2 VIEW

[knee ap]
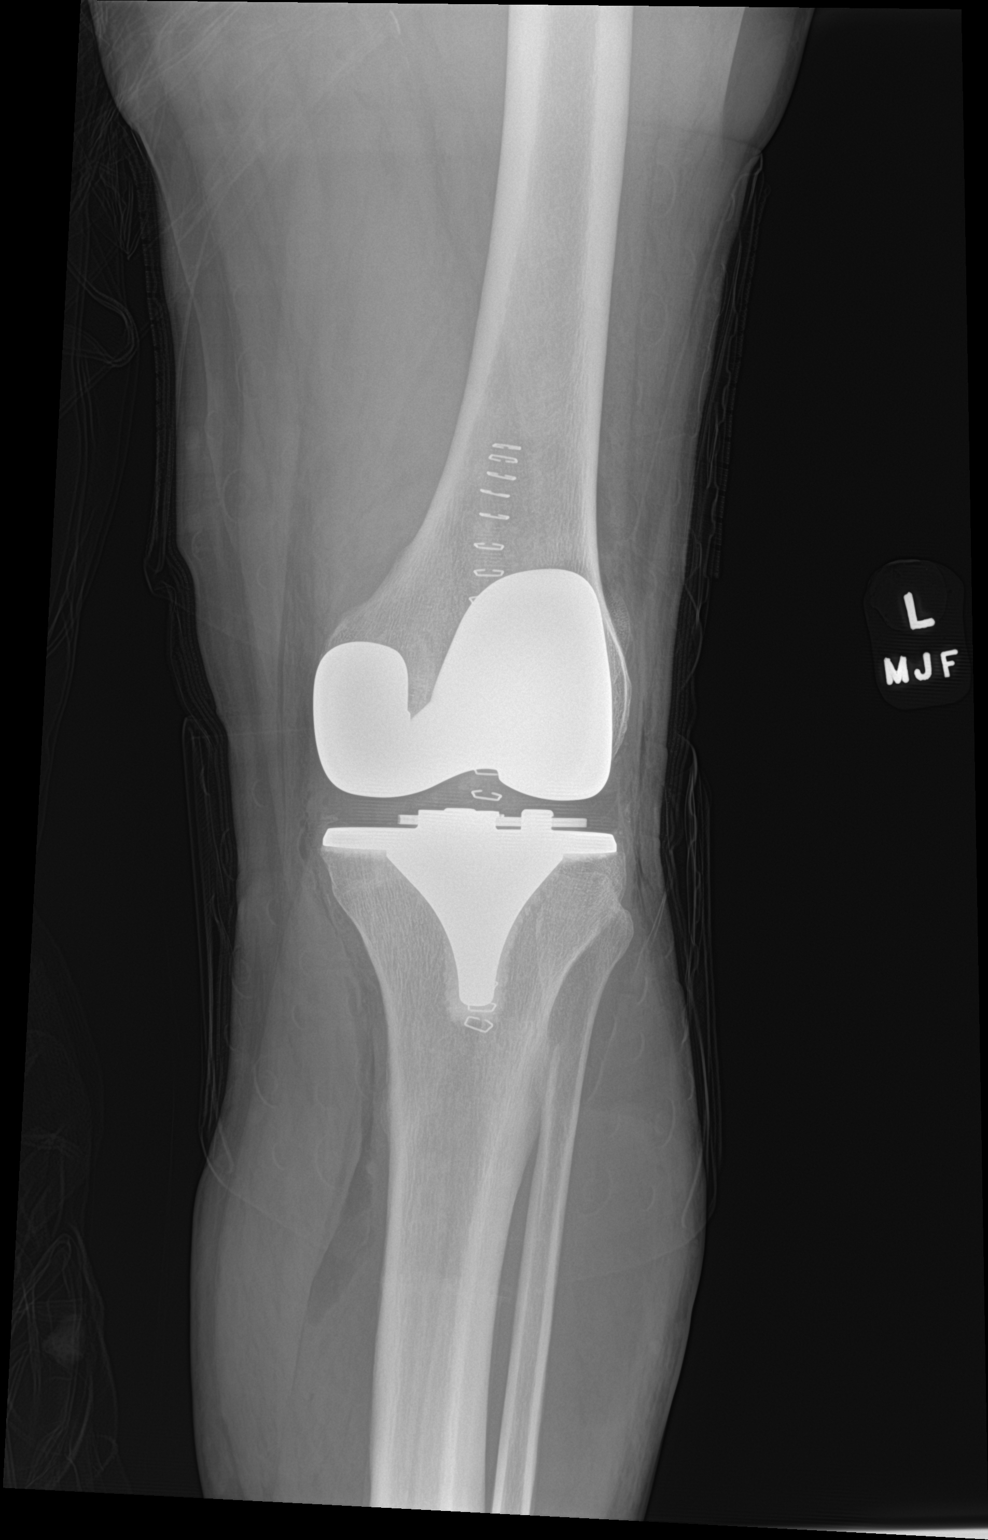

[knee lat]
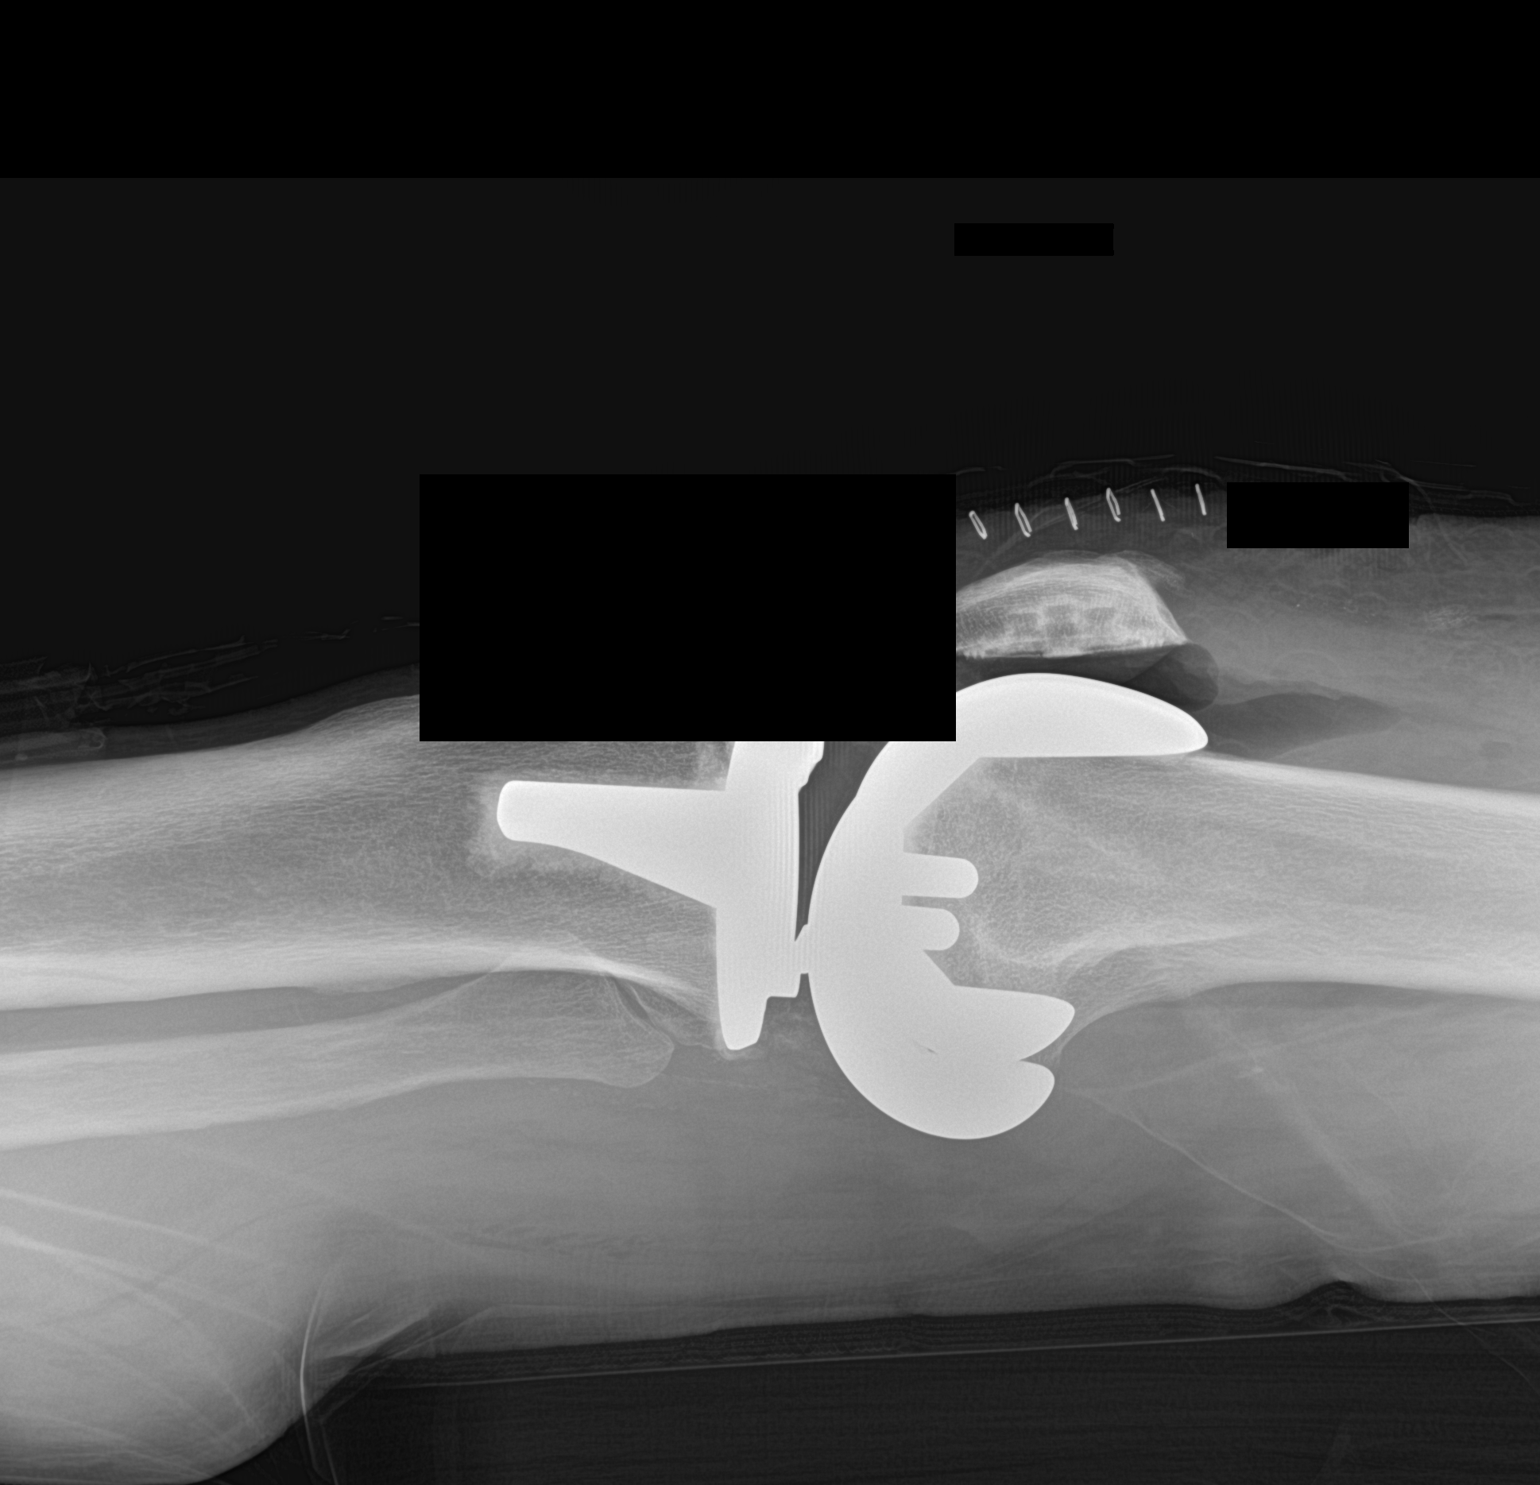

[2 of 2 positions shown; findings below may reference images not displayed]

FINDINGS: The patient is status post total knee arthroplasty. Hardware
alignment is within expected limits, without evidence of hardware
related complication. There is expected postoperative soft tissue
swelling and soft tissue gas.
IMPRESSION: Status post left knee arthroplasty without evidence of immediate
complication.

## 2023-04-03 ENCOUNTER — Telehealth: Payer: Self-pay | Admitting: Cardiovascular Disease

## 2023-04-03 DIAGNOSIS — R002 Palpitations: Secondary | ICD-10-CM

## 2023-04-03 MED ORDER — METOPROLOL SUCCINATE ER 50 MG PO TB24: ORAL_TABLET | ORAL | 0 refills | Status: DC

## 2023-04-03 NOTE — Telephone Encounter (Signed)
*  STAT* If patient is at the pharmacy, call can be transferred to refill team.   1. Which medications need to be refilled? (please list name of each medication and dose if known)   metoprolol succinate (TOPROL-XL) 50 MG 24 hr tablet    2. Which pharmacy/location (including street and city if local pharmacy) is medication to be sent to? [facility] -  [address] [place], [place], [place] [address]  3. Do they need a 30 day or 90 day supply?  90 day supply  Patient will run out of medication over the weekend.

## 2023-04-03 NOTE — Telephone Encounter (Signed)
 Requested refill sent to the patient's preferred pharmacy.

## 2023-04-21 ENCOUNTER — Ambulatory Visit: Attending: Cardiology

## 2023-04-21 DIAGNOSIS — R079 Chest pain, unspecified: Secondary | ICD-10-CM

## 2023-04-21 LAB — ECHOCARDIOGRAM COMPLETE
AR max vel: 2.85 cm2
AV Area VTI: 2.85 cm2
AV Area mean vel: 2.69 cm2
AV Mean grad: 4 mmHg
AV Peak grad: 7.3 mmHg
Ao pk vel: 1.35 m/s
Area-P 1/2: 3.65 cm2
Calc EF: 57.6 %
S' Lateral: 3.4 cm
Single Plane A2C EF: 56.8 %
Single Plane A4C EF: 60.3 %

## 2023-04-21 NOTE — Progress Notes (Signed)
 Heart squeeze is noted at 60-65%, considered normal function, no wall motion abnormalities were noted, there was mild leakage noted from the mitral valve, no other findings were noted. No structural abnormalities identified to be the cause of current symptoms.

## 2023-07-02 ENCOUNTER — Other Ambulatory Visit: Payer: Self-pay | Admitting: Cardiology

## 2023-07-02 DIAGNOSIS — R002 Palpitations: Secondary | ICD-10-CM

## 2023-12-11 ENCOUNTER — Encounter: Payer: Self-pay | Admitting: Cardiovascular Disease

## 2023-12-11 ENCOUNTER — Ambulatory Visit: Attending: Cardiovascular Disease | Admitting: Cardiovascular Disease

## 2023-12-11 VITALS — BP 128/78 | HR 64 | Ht 70.5 in | Wt 198.4 lb

## 2023-12-11 DIAGNOSIS — I2510 Atherosclerotic heart disease of native coronary artery without angina pectoris: Secondary | ICD-10-CM

## 2023-12-11 DIAGNOSIS — I10 Essential (primary) hypertension: Secondary | ICD-10-CM

## 2023-12-11 DIAGNOSIS — E785 Hyperlipidemia, unspecified: Secondary | ICD-10-CM | POA: Diagnosis not present

## 2023-12-11 DIAGNOSIS — R002 Palpitations: Secondary | ICD-10-CM

## 2023-12-11 NOTE — Patient Instructions (Signed)
 Medication Instructions:   Your physician recommends that you continue on your current medications as directed. Please refer to the Current Medication list given to you today.  *If you need a refill on your cardiac medications before your next appointment, please call your pharmacy*  Lab Work:  NONE  If you have labs (blood work) drawn today and your tests are completely normal, you will receive your results only by: MyChart Message (if you have MyChart) OR A paper copy in the mail If you have any lab test that is abnormal or we need to change your treatment, we will call you to review the results.  Testing/Procedures:  NONE  Follow-Up: At [facility], you and your health needs are our priority.  As part of our continuing mission to provide you with exceptional heart care, our providers are all part of one team.  This team includes your primary Cardiologist (physician) and Advanced Practice Providers or APPs (Physician Assistants and Nurse Practitioners) who all work together to provide you with the care you need, when you need it.  Your next appointment:   12 month(s)  Provider:   You may see Deatrice Cage, MD or one of the following Advanced Practice Providers on your designated Care Team:   Lonni Meager, NP Lesley Maffucci, PA-C Bernardino Bring, PA-C [facility], PA-C Tylene Lunch, NP Barnie Hila, NP   We recommend signing up for the patient portal called MyChart.  Sign up information is provided on this After Visit Summary.  MyChart is used to connect with patients for Virtual Visits (Telemedicine).  Patients are able to view lab/test results, encounter notes, upcoming appointments, etc.  Non-urgent messages can be sent to your provider as well.   To learn more about what you can do with MyChart, go to [email or web address].

## 2023-12-11 NOTE — Progress Notes (Signed)
 Cardiology Office Note   Date:  12/11/2023   ID:  Curtis Singh, DOB 1955-04-04, MRN [id number]  PCP:  Lora Odor, FNP  Cardiologist:   Deatrice Cage, MD  Chief Complaint  Patient presents with   Follow-up    12 month f/u c/o occasional rapid heart beat and pt has been taking extra tablet when needed when pt has heart flutter that he can hear in his ear. Meds reviewed verbally with pt.       History of Present Illness: Curtis Singh is a 68 y.o. male who is here today for follow-up visit regarding coronary artery disease.   He has known history of diabetes mellitus on insulin , hyperlipidemia, essential hypertension, aortic atherosclerosis and strong family history of coronary artery disease. He is not a smoker. He was seen in December of 2021 with unstable angina.   Cardiac catheterization showed 99% stenosis in the proximal/mid RCA and no other obstructive disease.  Ejection fraction and LVEDP were both normal.  I performed successful angioplasty and drug-eluting stent placement to the right coronary artery.  He underwent left knee replacement in December of 2022.   He has been doing very well with no chest pain or shortness of breath.  He had some brief palpitations that improved with taking Toprol  twice daily.  He continues to work on his diabetic control and he is currently on Jardiance, Ozempic  and Lantus .   Past Medical History:  Diagnosis Date   Aortic atherosclerosis    Coronary artery disease 12/2019   a.) LHC 12/16/2019: EF 55-65%, LVEDP norm. 99% p-mRCA, 20% mRCA. --> PCI placing a 3.0 x 22 mm Resolute Onyx DES to pRCA.   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    HLD (hyperlipidemia)    Hypertension    Long term current use of antithrombotics/antiplatelets    a.) DAPT (ASA + ticagrelor )   Osteoarthritis    Seasonal allergies    T2DM (type 2 diabetes mellitus) (HCC)      Past Surgical History:  Procedure Laterality Date   ARTHOSCOPIC ROTAOR CUFF REPAIR Left 02/25/2018   Procedure: LEFT ARTHROSCOPIC ROTATOR CUFF REPAIR; SUBACROMIAL DECOMPRESSION;  Surgeon: Dozier Soulier, MD;  Location: [facility];  Service: Orthopedics;  Laterality: Left;   CATARACT EXTRACTION W/PHACO Right 09/03/2016   Procedure: CATARACT EXTRACTION PHACO AND INTRAOCULAR LENS PLACEMENT (IOC) RIGHT DIABETIC;  Surgeon: Mittie Gaskin, MD;  Location: [facility];  Service: Ophthalmology;  Laterality: Right;  DIABETIC   CATARACT EXTRACTION W/PHACO Left 02/04/2023   Procedure: CATARACT EXTRACTION PHACO AND INTRAOCULAR LENS PLACEMENT (IOC) LEFT MALYUGIN DIABETIC 9.15, 00:42.0;  Surgeon: Mittie Gaskin, MD;  Location: [facility];  Service: Ophthalmology;  Laterality: Left;   COLONOSCOPY WITH PROPOFOL  N/A 08/29/2019   Procedure: COLONOSCOPY WITH PROPOFOL ;  Surgeon: Unk Corinn Skiff, MD;  Location: [facility];  Service: Gastroenterology;  Laterality: N/A;   CORONARY STENT INTERVENTION N/A 12/16/2019   Procedure: CORONARY STENT INTERVENTION (3.0 x 22 mm Resolute Onyx DES to pRCA);  Surgeon: Cage Deatrice LABOR, MD;  Location: [facility] IN[facility] CV LAB;  Service: Cardiovascular;  Laterality: N/A;   KNEE CLOSED REDUCTION Left 03/06/2021   Procedure: Left knee manipulation and steroid injection;  Surgeon: Edie Norleen PARAS, MD;  Location: [facility];  Service: Orthopedics;  Laterality: Left;   LEFT HEART CATH AND CORONARY ANGIOGRAPHY N/A 12/16/2019   Procedure: LEFT HEART CATH AND CORONARY ANGIOGRAPHY;  Surgeon: Cage,  Deatrice LABOR, MD;  Location: [facility] INVASIVE CV LAB;  Service: Cardiovascular;  Laterality: N/A;   ROTATOR CUFF REPAIR Right 01/07/2007   TOTAL KNEE ARTHROPLASTY Left 12/13/2020   Procedure: TOTAL KNEE ARTHROPLASTY;  Surgeon: Edie Norleen PARAS, MD;  Location: [facility];  Service: Orthopedics;  Laterality: Left;   VASECTOMY N/A      Current Outpatient Medications  Medication Sig  Dispense Refill   acetaminophen  (TYLENOL ) 500 MG tablet Take 1,000 mg by mouth every 6 (six) hours as needed for moderate pain.     aspirin  EC 81 MG tablet Take 81 mg by mouth daily. Swallow whole.     Blood Glucose Monitoring Suppl (FREESTYLE FREEDOM LITE) w/Device KIT Use as directed two times daily 1 kit 3   losartan  (COZAAR ) 25 MG tablet Take 1 tablet (25 mg total) by mouth daily in the afternoon.     metoprolol  succinate (TOPROL -XL) 50 MG 24 hr tablet TAKE 1/2 TABLET BY MOUTH EVERY MORNING AND 1 TABLET EVERY EVENING (Patient taking differently: Take 1/2 tablet by mouth every morning and 1 tablet every evening.) 135 tablet 1   omeprazole  (PRILOSEC) 20 MG capsule TAKE 1 CAPSULE BY MOUTH DAILY. 90 capsule 3   rosuvastatin  (CRESTOR ) 40 MG tablet Take 1 tablet (40mg ) by mouth daily 90 tablet 3   Semaglutide , 1 MG/DOSE, (OZEMPIC , 1 MG/DOSE,) 4 MG/3ML SOPN Inject 1 mg into the skin once a week. 9 mL 3   sildenafil  (VIAGRA ) 100 MG tablet Take 1 tablet (100 mg total) by mouth daily as needed for erectile dysfunction. 30 tablet 0   zolpidem  (AMBIEN ) 10 MG tablet Take 10 mg by mouth at bedtime as needed for sleep.     losartan  (COZAAR ) 25 MG tablet Take 1 tablet (25mg ) by mouth once daily. (Patient not taking: Reported on 12/11/2023) 90 tablet 3   losartan  (COZAAR ) 25 MG tablet Take 1 tablet (25 mg total) by mouth daily. (Patient not taking: Reported on 12/11/2023) 90 tablet 3   metFORMIN  (GLUCOPHAGE ) 500 MG tablet Take 500 mg by mouth daily with breakfast. (Patient not taking: Reported on 12/11/2023)     metFORMIN  (GLUCOPHAGE ) 500 MG tablet Take 1 tablet (500mg ) by mouth two times daily. (Patient not taking: Reported on 12/11/2023) 180 tablet 3   metFORMIN  (GLUCOPHAGE ) 500 MG tablet Take 1 tablet (500 mg total) by mouth 2 (two) times daily. (Patient not taking: Reported on 12/11/2023) 180 tablet 3   Multiple Vitamin (MULTIVITAMIN WITH MINERALS) TABS tablet Take 1 tablet by mouth at bedtime. (Patient not  taking: Reported on 12/11/2023)     omeprazole  (PRILOSEC) 20 MG capsule Take 1 capsule by mouth daily. (Patient not taking: Reported on 12/11/2023) 90 capsule 3   omeprazole  (PRILOSEC) 20 MG capsule Take 1 capsule (20 mg total) by mouth daily. (Patient not taking: Reported on 12/11/2023) 90 capsule 3   rosuvastatin  (CRESTOR ) 40 MG tablet 1 tab by mouth daily (Patient not taking: Reported on 12/11/2023) 90 tablet 3   Semaglutide , 1 MG/DOSE, (OZEMPIC , 1 MG/DOSE,) 4 MG/3ML SOPN Inject 1 mg into the skin once a week. (Patient not taking: Reported on 12/11/2023) 3 mL 3   sildenafil  (VIAGRA ) 100 MG tablet Take 1 tablet (100 mg total) by mouth daily. (Patient not taking: Reported on 12/11/2023) 30 tablet 3   sildenafil  (VIAGRA ) 100 MG tablet Take 1 tablet by mouth daily. (Patient not taking: Reported on 12/11/2023) 90 tablet 3   sildenafil  (VIAGRA ) 100 MG tablet Take 1 tablet (100 mg total) by mouth daily. (  Patient not taking: Reported on 12/11/2023) 90 tablet 3   tadalafil  (CIALIS ) 5 MG tablet Take 1 tablet (5 mg total) by mouth daily as needed for erectile dysfunction. (Patient not taking: Reported on 12/11/2023) 30 tablet 0   No current facility-administered medications for this visit.    Allergies:   Latex    Social History:  The patient  reports that he quit smoking about 41 years ago. His smoking use included cigarettes. He has never used smokeless tobacco. He reports current alcohol use of about 24.0 standard drinks of alcohol per week. He reports that he does not use drugs.   Family History:  The patient's family history includes Heart attack in his father, maternal uncle, maternal uncle, maternal uncle, and maternal uncle.    ROS:  Please see the history of present illness.   Otherwise, review of systems are positive for none.   All other systems are reviewed and negative.    PHYSICAL EXAM: VS:  BP 128/78 (BP Location: Left Arm, Patient Position: Sitting, Cuff Size: Normal)   Pulse 64   Ht 5'  10.5 (1.791 m)   Wt 198 lb 6 oz (90 kg)   SpO2 97%   BMI 28.06 kg/m  , BMI Body mass index is 28.06 kg/m. GEN: Well nourished, well developed, in no acute distress  HEENT: normal  Neck: no JVD, carotid bruits, or masses Cardiac: RRR; no  rubs, or gallops,no edema .  1/ 6 systolic murmur in the aortic area Respiratory:  clear to auscultation bilaterally, normal work of breathing GI: soft, nontender, nondistended, + BS MS: no deformity or atrophy  Skin: warm and dry, no rash Neuro:  Strength and sensation are intact Psych: euthymic mood, full affect   EKG:  EKG  ordered today. EKG showed: Normal sinus rhythm Normal ECG When compared with ECG of 22-Dec-2022 15:36, Nonspecific T wave abnormality now evident in Lateral leads    Recent Labs: No results found for requested labs within last 365 days.    Lipid Panel    Component Value Date/Time   CHOL 157 08/28/2022 1043   CHOL 239 (H) 01/20/2014 0729   TRIG 183 (H) 08/28/2022 1043   TRIG 171 01/20/2014 0729   HDL 59 08/28/2022 1043   HDL 48 01/20/2014 0729   CHOLHDL 2.7 08/28/2022 1043   CHOLHDL 2.2 09/04/2021 0719   VLDL 24 09/04/2021 0719   VLDL 34 01/20/2014 0729   LDLCALC 68 08/28/2022 1043   LDLCALC 157 (H) 01/20/2014 0729      Wt Readings from Last 3 Encounters:  12/11/23 198 lb 6 oz (90 kg)  02/04/23 191 lb (86.6 kg)  01/15/23 188 lb (85.3 kg)        ASSESSMENT AND PLAN:  1.  Coronary artery disease involving native coronary arteries without angina: He is doing very well at the present time with no anginal symptoms.  Continue aspirin  indefinitely.  2.  Hyperlipidemia: I reviewed most recent lipid profile done in October which showed an LDL of 61 and triglyceride of 105.  Continue rosuvastatin  40 mg daily.  3.  Essential hypertension: Blood pressure is well-controlled on current medications.  4.  Diabetes mellitus: He is no longer on metformin  due to side effects.  He is now managed by endocrinology and  he is on Ozempic , Lantus  and Jardiance.  Most recent hemoglobin A1c was 8.8.  5.  Previous palpitations: Seems to be suggestive of premature beats but improved with twice daily Toprol .  Outpatient monitor in  January showed only short runs of SVT the longest lasted only 8 beats.   Disposition:   FU with me in 12 months  Signed, Deatrice Cage, MD 12/11/2023 8:19 AM    [facility]

## 2023-12-25 ENCOUNTER — Other Ambulatory Visit: Payer: Self-pay | Admitting: Cardiovascular Disease

## 2023-12-25 DIAGNOSIS — R002 Palpitations: Secondary | ICD-10-CM
